# Patient Record
Sex: Female | Born: 1990 | Race: Black or African American | Hispanic: No | Marital: Single | Smoking: Never smoker
Health system: Southern US, Community
[De-identification: ages and names within clinical notes are randomized; demographics above are authoritative.]

## PROBLEM LIST (undated history)

## (undated) ENCOUNTER — Inpatient Hospital Stay (HOSPITAL_COMMUNITY): Payer: Self-pay

## (undated) DIAGNOSIS — D509 Iron deficiency anemia, unspecified: Secondary | ICD-10-CM

## (undated) DIAGNOSIS — M549 Dorsalgia, unspecified: Secondary | ICD-10-CM

## (undated) DIAGNOSIS — I10 Essential (primary) hypertension: Secondary | ICD-10-CM

## (undated) DIAGNOSIS — O24419 Gestational diabetes mellitus in pregnancy, unspecified control: Secondary | ICD-10-CM

## (undated) DIAGNOSIS — I2699 Other pulmonary embolism without acute cor pulmonale: Secondary | ICD-10-CM

## (undated) DIAGNOSIS — G4730 Sleep apnea, unspecified: Secondary | ICD-10-CM

## (undated) DIAGNOSIS — E119 Type 2 diabetes mellitus without complications: Secondary | ICD-10-CM

## (undated) DIAGNOSIS — D649 Anemia, unspecified: Secondary | ICD-10-CM

## (undated) DIAGNOSIS — I509 Heart failure, unspecified: Secondary | ICD-10-CM

## (undated) DIAGNOSIS — R0602 Shortness of breath: Secondary | ICD-10-CM

## (undated) HISTORY — DX: Anemia, unspecified: D64.9

## (undated) HISTORY — DX: Type 2 diabetes mellitus without complications: E11.9

## (undated) HISTORY — DX: Shortness of breath: R06.02

## (undated) HISTORY — DX: Dorsalgia, unspecified: M54.9

## (undated) HISTORY — DX: Iron deficiency anemia, unspecified: D50.9

---
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
## 2012-04-21 DIAGNOSIS — O24419 Gestational diabetes mellitus in pregnancy, unspecified control: Secondary | ICD-10-CM

## 2015-06-19 ENCOUNTER — Emergency Department (HOSPITAL_COMMUNITY)
Admission: EM | Admit: 2015-06-19 | Discharge: 2015-06-19 | Disposition: A | Discharge status: Home / Self Care | Payer: Medicaid Other | Attending: Emergency Medicine | Admitting: Emergency Medicine

## 2015-06-19 ENCOUNTER — Emergency Department (HOSPITAL_COMMUNITY): Payer: Medicaid Other

## 2015-06-19 ENCOUNTER — Encounter (HOSPITAL_COMMUNITY): Payer: Self-pay | Admitting: Emergency Medicine

## 2015-06-19 DIAGNOSIS — F17200 Nicotine dependence, unspecified, uncomplicated: Secondary | ICD-10-CM | POA: Insufficient documentation

## 2015-06-19 DIAGNOSIS — J988 Other specified respiratory disorders: Secondary | ICD-10-CM

## 2015-06-19 DIAGNOSIS — Z79899 Other long term (current) drug therapy: Secondary | ICD-10-CM | POA: Diagnosis not present

## 2015-06-19 DIAGNOSIS — R102 Pelvic and perineal pain: Secondary | ICD-10-CM

## 2015-06-19 DIAGNOSIS — R5383 Other fatigue: Secondary | ICD-10-CM | POA: Insufficient documentation

## 2015-06-19 DIAGNOSIS — Z3490 Encounter for supervision of normal pregnancy, unspecified, unspecified trimester: Secondary | ICD-10-CM

## 2015-06-19 DIAGNOSIS — O219 Vomiting of pregnancy, unspecified: Secondary | ICD-10-CM | POA: Diagnosis not present

## 2015-06-19 DIAGNOSIS — Z3A01 Less than 8 weeks gestation of pregnancy: Secondary | ICD-10-CM | POA: Diagnosis not present

## 2015-06-19 DIAGNOSIS — B9789 Other viral agents as the cause of diseases classified elsewhere: Secondary | ICD-10-CM

## 2015-06-19 DIAGNOSIS — R112 Nausea with vomiting, unspecified: Secondary | ICD-10-CM

## 2015-06-19 DIAGNOSIS — R630 Anorexia: Secondary | ICD-10-CM | POA: Diagnosis not present

## 2015-06-19 DIAGNOSIS — O99511 Diseases of the respiratory system complicating pregnancy, first trimester: Secondary | ICD-10-CM | POA: Insufficient documentation

## 2015-06-19 DIAGNOSIS — B9689 Other specified bacterial agents as the cause of diseases classified elsewhere: Secondary | ICD-10-CM

## 2015-06-19 DIAGNOSIS — O23591 Infection of other part of genital tract in pregnancy, first trimester: Secondary | ICD-10-CM | POA: Diagnosis not present

## 2015-06-19 DIAGNOSIS — O99331 Smoking (tobacco) complicating pregnancy, first trimester: Secondary | ICD-10-CM | POA: Insufficient documentation

## 2015-06-19 DIAGNOSIS — O9989 Other specified diseases and conditions complicating pregnancy, childbirth and the puerperium: Secondary | ICD-10-CM | POA: Diagnosis not present

## 2015-06-19 DIAGNOSIS — J069 Acute upper respiratory infection, unspecified: Secondary | ICD-10-CM | POA: Insufficient documentation

## 2015-06-19 DIAGNOSIS — N760 Acute vaginitis: Secondary | ICD-10-CM

## 2015-06-19 LAB — CBC WITH DIFFERENTIAL/PLATELET
Basophils Absolute: 0 10*3/uL (ref 0.0–0.1)
Basophils Relative: 0 %
Eosinophils Absolute: 0 10*3/uL (ref 0.0–0.7)
Eosinophils Relative: 0 %
HCT: 34.9 % — ABNORMAL LOW (ref 36.0–46.0)
Hemoglobin: 11.6 g/dL — ABNORMAL LOW (ref 12.0–15.0)
Lymphocytes Relative: 20 %
Lymphs Abs: 2.5 10*3/uL (ref 0.7–4.0)
MCH: 20.7 pg — ABNORMAL LOW (ref 26.0–34.0)
MCHC: 33.2 g/dL (ref 30.0–36.0)
MCV: 62.3 fL — ABNORMAL LOW (ref 78.0–100.0)
Monocytes Absolute: 0.6 10*3/uL (ref 0.1–1.0)
Monocytes Relative: 5 %
Neutro Abs: 9.3 10*3/uL — ABNORMAL HIGH (ref 1.7–7.7)
Neutrophils Relative %: 75 %
Platelets: 425 10*3/uL — ABNORMAL HIGH (ref 150–400)
RBC: 5.6 MIL/uL — ABNORMAL HIGH (ref 3.87–5.11)
RDW: 20.7 % — ABNORMAL HIGH (ref 11.5–15.5)
WBC: 12.4 10*3/uL — ABNORMAL HIGH (ref 4.0–10.5)

## 2015-06-19 LAB — WET PREP, GENITAL
Sperm: NONE SEEN
Trich, Wet Prep: NONE SEEN
Yeast Wet Prep HPF POC: NONE SEEN

## 2015-06-19 LAB — COMPREHENSIVE METABOLIC PANEL
ALT: 13 U/L — ABNORMAL LOW (ref 14–54)
AST: 18 U/L (ref 15–41)
Albumin: 3.6 g/dL (ref 3.5–5.0)
Alkaline Phosphatase: 92 U/L (ref 38–126)
Anion gap: 12 (ref 5–15)
BUN: <5 mg/dL — ABNORMAL LOW (ref 6–20)
CO2: 20 mmol/L — ABNORMAL LOW (ref 22–32)
Calcium: 9.3 mg/dL (ref 8.9–10.3)
Chloride: 106 mmol/L (ref 101–111)
Creatinine, Ser: 0.63 mg/dL (ref 0.44–1.00)
GFR calc Af Amer: >60 mL/min (ref >60)
GFR calc non Af Amer: >60 mL/min (ref >60)
Glucose, Bld: 82 mg/dL (ref 65–99)
Potassium: 3.4 mmol/L — ABNORMAL LOW (ref 3.5–5.1)
Sodium: 138 mmol/L (ref 135–145)
Total Bilirubin: 0.7 mg/dL (ref 0.3–1.2)
Total Protein: 7.8 g/dL (ref 6.5–8.1)

## 2015-06-19 LAB — LIPASE, BLOOD: Lipase: 23 U/L (ref 11–51)

## 2015-06-19 LAB — PREGNANCY, URINE: Preg Test, Ur: POSITIVE — AB

## 2015-06-19 LAB — HCG, QUANTITATIVE, PREGNANCY: hCG, Beta Chain, Quant, S: 37216 m[IU]/mL — ABNORMAL HIGH (ref <5)

## 2015-06-19 MED ORDER — ONDANSETRON HCL 4 MG/2ML IJ SOLN
4.0000 mg | Freq: Once | INTRAMUSCULAR | Status: AC
  Administered 2015-06-19: 4 mg via INTRAVENOUS
  Filled 2015-06-19: qty 2

## 2015-06-19 MED ORDER — METRONIDAZOLE 500 MG PO TABS: 500.0000 mg | ORAL_TABLET | Freq: Two times a day (BID) | ORAL | Status: AC

## 2015-06-19 MED ORDER — DOXYLAMINE-PYRIDOXINE 10-10 MG PO TBEC: 10.0000 mg | DELAYED_RELEASE_TABLET | Freq: Once | ORAL | Status: DC

## 2015-06-19 MED ORDER — SODIUM CHLORIDE 0.9 % IV BOLUS (SEPSIS)
1000.0000 mL | Freq: Once | INTRAVENOUS | Status: AC
  Administered 2015-06-19: 1000 mL via INTRAVENOUS

## 2015-06-19 MED ORDER — GUAIFENESIN-DM 100-10 MG/5ML PO SYRP: 10.0000 mL | ORAL_SOLUTION | ORAL | Status: DC | PRN

## 2015-06-19 MED ORDER — MORPHINE SULFATE (PF) 4 MG/ML IV SOLN: 4.0000 mg | Freq: Once | INTRAVENOUS | Status: DC

## 2015-06-19 NOTE — ED Notes (Signed)
Pt stable, ambulatory, states understanding of discharge instructions 

## 2015-06-19 NOTE — ED Notes (Signed)
Main lab made aware of incorrect collection documentation of RPR. Blood sample was just sent to main lab by this nurse.

## 2015-06-19 NOTE — ED Notes (Signed)
PT transported to [place] at this time.

## 2015-06-19 NOTE — Discharge Instructions (Signed)
Medications: Diclegis, Flagyl, and Robitussin DM  Treatment: Take Diclegis as prescribed for nausea and vomiting. Take Flagyl as prescribed for bacterial vaginosis. Take Robitussin DM every 4 hours as needed for cough. You may take Tylenol for your body aches. Please start taking a Prenatal vitamin. You can buy this over-the-counter.  Follow-up: Please follow-up with your obstetric doctor at [facility] or at the [facility] listed here on your discharge paperwork. Please return to the emergency department if you develop any new or worsening symptoms, including vaginal bleeding, abdominal cramping, fevers, or any other concerning symptom.   Bacterial Vaginosis Bacterial vaginosis is a vaginal infection that occurs when the normal balance of bacteria in the vagina is disrupted. It results from an overgrowth of certain bacteria. This is the most common vaginal infection in women of childbearing age. Treatment is important to prevent complications, especially in pregnant women, as it can cause a premature delivery. CAUSES  Bacterial vaginosis is caused by an increase in harmful bacteria that are normally present in smaller amounts in the vagina. Several different kinds of bacteria can cause bacterial vaginosis. However, the reason that the condition develops is not fully understood. RISK FACTORS Certain activities or behaviors can put you at an increased risk of developing bacterial vaginosis, including:  Having a new sex partner or multiple sex partners.  Douching.  Using an intrauterine device (IUD) for contraception. Women do not get bacterial vaginosis from toilet seats, bedding, swimming pools, or contact with objects around them. SIGNS AND SYMPTOMS  Some women with bacterial vaginosis have no signs or symptoms. Common symptoms include:  Grey vaginal discharge.  A fishlike odor with discharge, especially after sexual intercourse.  Itching or burning of the vagina and vulva.  Burning  or pain with urination. DIAGNOSIS  Your health care provider will take a medical history and examine the vagina for signs of bacterial vaginosis. A sample of vaginal fluid may be taken. Your health care provider will look at this sample under a microscope to check for bacteria and abnormal cells. A vaginal pH test may also be done.  TREATMENT  Bacterial vaginosis may be treated with antibiotic medicines. These may be given in the form of a pill or a vaginal cream. A second round of antibiotics may be prescribed if the condition comes back after treatment. Because bacterial vaginosis increases your risk for sexually transmitted diseases, getting treated can help reduce your risk for chlamydia, gonorrhea, HIV, and herpes. HOME CARE INSTRUCTIONS   Only take over-the-counter or prescription medicines as directed by your health care provider.  If antibiotic medicine was prescribed, take it as directed. Make sure you finish it even if you start to feel better.  Tell all sexual partners that you have a vaginal infection. They should see their health care provider and be treated if they have problems, such as a mild rash or itching.  During treatment, it is important that you follow these instructions:  Avoid sexual activity or use condoms correctly.  Do not douche.  Avoid alcohol as directed by your health care provider.  Avoid breastfeeding as directed by your health care provider. SEEK MEDICAL CARE IF:   Your symptoms are not improving after 3 days of treatment.  You have increased discharge or pain.  You have a fever. MAKE SURE YOU:   Understand these instructions.  Will watch your condition.  Will get help right away if you are not doing well or get worse. FOR MORE INFORMATION  Centers for Disease Control  and Prevention, Division of STD Prevention: [employer] American Sexual Health Association (ASHA): www.ashastd.org    This information is not intended to replace advice  given to you by your health care provider. Make sure you discuss any questions you have with your health care provider.   Document Released: 03/03/2005 Document Revised: 03/24/2014 Document Reviewed: 10/13/2012 Elsevier Interactive Patient Education 2016 [employer].  First Trimester of Pregnancy The first trimester of pregnancy is from week 1 until the end of week 12 (months 1 through 3). A week after a sperm fertilizes an egg, the egg will implant on the wall of the uterus. This embryo will begin to develop into a baby. Genes from you and your partner are forming the baby. The female genes determine whether the baby is a boy or a girl. At 6-8 weeks, the eyes and face are formed, and the heartbeat can be seen on ultrasound. At the end of 12 weeks, all the baby's organs are formed.  Now that you are pregnant, you will want to do everything you can to have a healthy baby. Two of the most important things are to get good prenatal care and to follow your health care provider's instructions. Prenatal care is all the medical care you receive before the baby's birth. This care will help prevent, find, and treat any problems during the pregnancy and childbirth. BODY CHANGES Your body goes through many changes during pregnancy. The changes vary from woman to woman.   You may gain or lose a couple of pounds at first.  You may feel sick to your stomach (nauseous) and throw up (vomit). If the vomiting is uncontrollable, call your health care provider.  You may tire easily.  You may develop headaches that can be relieved by medicines approved by your health care provider.  You may urinate more often. Painful urination may mean you have a bladder infection.  You may develop heartburn as a result of your pregnancy.  You may develop constipation because certain hormones are causing the muscles that push waste through your intestines to slow down.  You may develop hemorrhoids or swollen, bulging veins  (varicose veins).  Your breasts may begin to grow larger and become tender. Your nipples may stick out more, and the tissue that surrounds them (areola) may become darker.  Your gums may bleed and may be sensitive to brushing and flossing.  Dark spots or blotches (chloasma, mask of pregnancy) may develop on your face. This will likely fade after the baby is born.  Your menstrual periods will stop.  You may have a loss of appetite.  You may develop cravings for certain kinds of food.  You may have changes in your emotions from day to day, such as being excited to be pregnant or being concerned that something may go wrong with the pregnancy and baby.  You may have more vivid and strange dreams.  You may have changes in your hair. These can include thickening of your hair, rapid growth, and changes in texture. Some women also have hair loss during or after pregnancy, or hair that feels dry or thin. Your hair will most likely return to normal after your baby is born. WHAT TO EXPECT AT YOUR PRENATAL VISITS During a routine prenatal visit:  You will be weighed to make sure you and the baby are growing normally.  Your blood pressure will be taken.  Your abdomen will be measured to track your baby's growth.  The fetal heartbeat will be listened to  starting around week 10 or 12 of your pregnancy.  Test results from any previous visits will be discussed. Your health care provider may ask you:  How you are feeling.  If you are feeling the baby move.  If you have had any abnormal symptoms, such as leaking fluid, bleeding, severe headaches, or abdominal cramping.  If you are using any tobacco products, including cigarettes, chewing tobacco, and electronic cigarettes.  If you have any questions. Other tests that may be performed during your first trimester include:  Blood tests to find your blood type and to check for the presence of any previous infections. They will also be used to  check for low iron levels (anemia) and Rh antibodies. Later in the pregnancy, blood tests for diabetes will be done along with other tests if problems develop.  Urine tests to check for infections, diabetes, or protein in the urine.  An ultrasound to confirm the proper growth and development of the baby.  An amniocentesis to check for possible genetic problems.  Fetal screens for spina bifida and Down syndrome.  You may need other tests to make sure you and the baby are doing well.  HIV (human immunodeficiency virus) testing. Routine prenatal testing includes screening for HIV, unless you choose not to have this test. HOME CARE INSTRUCTIONS  Medicines  Follow your health care provider's instructions regarding medicine use. Specific medicines may be either safe or unsafe to take during pregnancy.  Take your prenatal vitamins as directed.  If you develop constipation, try taking a stool softener if your health care provider approves. Diet  Eat regular, well-balanced meals. Choose a variety of foods, such as meat or vegetable-based protein, fish, milk and low-fat dairy products, vegetables, fruits, and whole grain breads and cereals. Your health care provider will help you determine the amount of weight gain that is right for you.  Avoid raw meat and uncooked cheese. These carry germs that can cause birth defects in the baby.  Eating four or five small meals rather than three large meals a day may help relieve nausea and vomiting. If you start to feel nauseous, eating a few soda crackers can be helpful. Drinking liquids between meals instead of during meals also seems to help nausea and vomiting.  If you develop constipation, eat more high-fiber foods, such as fresh vegetables or fruit and whole grains. Drink enough fluids to keep your urine clear or pale yellow. Activity and Exercise  Exercise only as directed by your health care provider. Exercising will help you:  Control your  weight.  Stay in shape.  Be prepared for labor and delivery.  Experiencing pain or cramping in the lower abdomen or low back is a good sign that you should stop exercising. Check with your health care provider before continuing normal exercises.  Try to avoid standing for long periods of time. Move your legs often if you must stand in one place for a long time.  Avoid heavy lifting.  Wear low-heeled shoes, and practice good posture.  You may continue to have sex unless your health care provider directs you otherwise. Relief of Pain or Discomfort  Wear a good support bra for breast tenderness.   Take warm sitz baths to soothe any pain or discomfort caused by hemorrhoids. Use hemorrhoid cream if your health care provider approves.   Rest with your legs elevated if you have leg cramps or low back pain.  If you develop varicose veins in your legs, wear support hose. Elevate  your feet for 15 minutes, 3-4 times a day. Limit salt in your diet. Prenatal Care  Schedule your prenatal visits by the twelfth week of pregnancy. They are usually scheduled monthly at first, then more often in the last 2 months before delivery.  Write down your questions. Take them to your prenatal visits.  Keep all your prenatal visits as directed by your health care provider. Safety  Wear your seat belt at all times when driving.  Make a list of emergency phone numbers, including numbers for family, friends, the hospital, and police and fire departments. General Tips  Ask your health care provider for a referral to a local prenatal education class. Begin classes no later than at the beginning of month 6 of your pregnancy.  Ask for help if you have counseling or nutritional needs during pregnancy. Your health care provider can offer advice or refer you to specialists for help with various needs.  Do not use hot tubs, steam rooms, or saunas.  Do not douche or use tampons or scented sanitary pads.  Do  not cross your legs for long periods of time.  Avoid cat litter boxes and soil used by cats. These carry germs that can cause birth defects in the baby and possibly loss of the fetus by miscarriage or stillbirth.  Avoid all smoking, herbs, alcohol, and medicines not prescribed by your health care provider. Chemicals in these affect the formation and growth of the baby.  Do not use any tobacco products, including cigarettes, chewing tobacco, and electronic cigarettes. If you need help quitting, ask your health care provider. You may receive counseling support and other resources to help you quit.  Schedule a dentist appointment. At home, brush your teeth with a soft toothbrush and be gentle when you floss. SEEK MEDICAL CARE IF:   You have dizziness.  You have mild pelvic cramps, pelvic pressure, or nagging pain in the abdominal area.  You have persistent nausea, vomiting, or diarrhea.  You have a bad smelling vaginal discharge.  You have pain with urination.  You notice increased swelling in your face, hands, legs, or ankles. SEEK IMMEDIATE MEDICAL CARE IF:   You have a fever.  You are leaking fluid from your vagina.  You have spotting or bleeding from your vagina.  You have severe abdominal cramping or pain.  You have rapid weight gain or loss.  You vomit blood or material that looks like coffee grounds.  You are exposed to [place] measles and have never had them.  You are exposed to fifth disease or chickenpox.  You develop a severe headache.  You have shortness of breath.  You have any kind of trauma, such as from a fall or a car accident.   This information is not intended to replace advice given to you by your health care provider. Make sure you discuss any questions you have with your health care provider.   Document Released: 02/25/2001 Document Revised: 03/24/2014 Document Reviewed: 01/11/2013 Elsevier Interactive Patient Education 2016 Elsevier Inc.  Viral  Infections A viral infection can be caused by different types of viruses.Most viral infections are not serious and resolve on their own. However, some infections may cause severe symptoms and may lead to further complications. SYMPTOMS Viruses can frequently cause:  Minor sore throat.  Aches and pains.  Headaches.  Runny nose.  Different types of rashes.  Watery eyes.  Tiredness.  Cough.  Loss of appetite.  Gastrointestinal infections, resulting in nausea, vomiting, and diarrhea. These symptoms  do not respond to antibiotics because the infection is not caused by bacteria. However, you might catch a bacterial infection following the viral infection. This is sometimes called a "superinfection." Symptoms of such a bacterial infection may include:  Worsening sore throat with pus and difficulty swallowing.  Swollen neck glands.  Chills and a high or persistent fever.  Severe headache.  Tenderness over the sinuses.  Persistent overall ill feeling (malaise), muscle aches, and tiredness (fatigue).  Persistent cough.  Yellow, green, or brown mucus production with coughing. HOME CARE INSTRUCTIONS   Only take over-the-counter or prescription medicines for pain, discomfort, diarrhea, or fever as directed by your caregiver.  Drink enough water and fluids to keep your urine clear or pale yellow. Sports drinks can provide valuable electrolytes, sugars, and hydration.  Get plenty of rest and maintain proper nutrition. Soups and broths with crackers or rice are fine. SEEK IMMEDIATE MEDICAL CARE IF:   You have severe headaches, shortness of breath, chest pain, neck pain, or an unusual rash.  You have uncontrolled vomiting, diarrhea, or you are unable to keep down fluids.  You or your child has an oral temperature above 102 F (38.9 C), not controlled by medicine.  Your baby is older than 3 months with a rectal temperature of 102 F (38.9 C) or higher.  Your baby is 903 months  old or younger with a rectal temperature of 100.4 F (38 C) or higher. MAKE SURE YOU:   Understand these instructions.  Will watch your condition.  Will get help right away if you are not doing well or get worse.   This information is not intended to replace advice given to you by your health care provider. Make sure you discuss any questions you have with your health care provider.   Document Released: 12/11/2004 Document Revised: 05/26/2011 Document Reviewed: 08/09/2014 Elsevier Interactive Patient Education [employer] Elsevier Inc.

## 2015-06-19 NOTE — ED Notes (Addendum)
STARTED 2 DAYS AGO WITH BODYACHES, SORE THROAT, ABD PAIN AND VOMITING.  ALSO, STATES DID NOT HAVE A PERIOD IN MARCH. STATES HAS VAGINAL DISCHARGE "LIKE YEAST".

## 2015-06-19 NOTE — ED Notes (Signed)
ED PA at bedside

## 2015-06-19 NOTE — ED Provider Notes (Signed)
CSN: [id number]     Arrival date & time 06/19/15  1006 History   First MD Initiated Contact with Patient 06/19/15 1014     Chief Complaint  Patient presents with  . Abdominal Pain  . Emesis     (Consider location/radiation/quality/duration/timing/severity/associated sxs/prior Treatment) HPI Comments: Patient is a previously healthy 24 year old female who presents with flulike symptoms. Patient reports she began with cough, headache, body aches, sore throat, transient abdominal pain, nausea, vomiting yesterday while she was at work. Patient has vomited most likely bile 2-3 times this morning. She reports she has not been able to keep any food or fluids down since yesterday. Patient reports that she her whole body is sore and fatigued like she "worked out a lot yesterday." Has not taken any medicines at home. She states she has felt warm but has not documented a fever. Also states she has not had a period since February. Her periods had been previously normal of recent. 2 years ago her when she was on Depo-Provera, her periods were irregular. Pt is sexually active and has not been using birth control for the past 2 years. Patient reports intermittent vaginal discharge that resembled past yeast infections. She is not having discharge today.  Patient is a 25 y.o. female presenting with abdominal pain and vomiting. The history is provided by the patient.  Abdominal Pain Associated symptoms: cough, fatigue, nausea, sore throat and vomiting   Associated symptoms: no chest pain, no chills, no dysuria, no fever and no shortness of breath   Emesis Associated symptoms: abdominal pain (generalized), headaches, myalgias and sore throat   Associated symptoms: no chills     History reviewed. No pertinent past medical history. Past Surgical History  Procedure Laterality Date  . Cesarean section     No family history on file. Social History  Substance Use Topics  . Smoking status: Current Every Day  Smoker  . Smokeless tobacco: None  . Alcohol Use: No   OB History    No data available     Review of Systems  Constitutional: Positive for appetite change and fatigue. Negative for fever and chills.  HENT: Positive for sore throat. Negative for ear pain and facial swelling.   Respiratory: Positive for cough. Negative for shortness of breath.   Cardiovascular: Negative for chest pain.  Gastrointestinal: Positive for nausea, vomiting and abdominal pain (generalized).  Genitourinary: Negative for dysuria, urgency, frequency and difficulty urinating.  Musculoskeletal: Positive for myalgias. Negative for back pain.  Skin: Negative for rash and wound.  Neurological: Positive for headaches.  Psychiatric/Behavioral: The patient is not nervous/anxious.       Allergies  Review of patient's allergies indicates no known allergies.  Home Medications   Prior to Admission medications   Medication Sig Start Date End Date Taking? Authorizing Provider  ibuprofen (ADVIL,MOTRIN) 400 MG tablet Take 400 mg by mouth every 6 (six) hours as needed for mild pain.   Yes Historical Provider, MD  Doxylamine-Pyridoxine (DICLEGIS) 10-10 MG TBEC Take 10 mg by mouth once. Take 2 tablets at bedtime on day 1and 2; if symptoms persist, take one tablet in the morning and 2 tablets at bedtime on day 3. Do not take more than 4 tablets per day. 06/19/15   Tsuneo Faison M Syrina Wake, PA-C  guaiFENesin-dextromethorphan (ROBITUSSIN DM) 100-10 MG/5ML syrup Take 10 mLs by mouth every 4 (four) hours as needed for cough. 06/19/15   Emi Holes, PA-C  metroNIDAZOLE (FLAGYL) 500 MG tablet Take 1 tablet (500 mg total)  by mouth 2 (two) times daily. 06/19/15 06/26/15  Tanza Pellot M Srihith Aquilino, PA-C   BP 112/65 mmHg  Pulse 93  Temp(Src) 98.7 F (37.1 C) (Oral)  Resp 16  Ht  (1.575 m)  Wt 116.302 kg  BMI 46.88 kg/m2  SpO2 100%  LMP 05/02/2015 Physical Exam  Constitutional: She appears well-developed and well-nourished. No distress.  HENT:   Head: Normocephalic and atraumatic.  Mouth/Throat: Oropharynx is clear and moist. No oropharyngeal exudate.  Eyes: Conjunctivae are normal. Pupils are equal, round, and reactive to light. Right eye exhibits no discharge. Left eye exhibits no discharge. No scleral icterus.  Neck: Normal range of motion. Neck supple. No thyromegaly present.  Cardiovascular: Normal rate, regular rhythm, normal heart sounds and intact distal pulses.  Exam reveals no gallop and no friction rub.   No murmur heard. Pulmonary/Chest: Effort normal and breath sounds normal. No stridor. No respiratory distress. She has no wheezes. She has no rales.  Abdominal: Soft. She exhibits no distension. There is generalized tenderness. There is no rebound and no guarding.  Genitourinary: Right adnexum displays no mass. Left adnexum displays no mass. No bleeding in the vagina. No foreign body around the vagina. Vaginal discharge (minimal, normal looking discharge from cervical os) found.  Patient did report some increased pressure on exam, but no cervical motion tenderness; no masses palpated  Musculoskeletal: She exhibits no edema.  Lymphadenopathy:    She has no cervical adenopathy.  Neurological: She is alert. Coordination normal.  Skin: Skin is warm and dry. No rash noted. She is not diaphoretic. No pallor.  Psychiatric: She has a normal mood and affect.  Nursing note and vitals reviewed.   ED Course  Procedures (including critical care time) Labs Review Labs Reviewed  WET PREP, GENITAL - Abnormal; Notable for the following:    Clue Cells Wet Prep HPF POC PRESENT (*)    WBC, Wet Prep HPF POC MANY (*)    All other components within normal limits  PREGNANCY, URINE - Abnormal; Notable for the following:    Preg Test, Ur POSITIVE (*)    All other components within normal limits  COMPREHENSIVE METABOLIC PANEL - Abnormal; Notable for the following:    Potassium 3.4 (*)    CO2 20 (*)    BUN <5 (*)    ALT 13 (*)    All  other components within normal limits  CBC WITH DIFFERENTIAL/PLATELET - Abnormal; Notable for the following:    WBC 12.4 (*)    RBC 5.60 (*)    Hemoglobin 11.6 (*)    HCT 34.9 (*)    MCV 62.3 (*)    MCH 20.7 (*)    RDW 20.7 (*)    Platelets 425 (*)    Neutro Abs 9.3 (*)    All other components within normal limits  HCG, QUANTITATIVE, PREGNANCY - Abnormal; Notable for the following:    hCG, Beta Chain, Quant, S 37216 (*)    All other components within normal limits  LIPASE, BLOOD  RPR  GC/CHLAMYDIA PROBE AMP ([place]) NOT AT [facility]    Imaging Review US Ob Comp Less 14 Wks  06/19/2015  CLINICAL DATA:  Nausea and vomiting. First-trimester pregnancy. Abdominal pain EXAM: OBSTETRIC <14 WK [place] AND TRANSVAGINAL OB US TECHNIQUE: Both transabdominal and transvaginal ultrasound examinations were performed for complete evaluation of the gestation as well as the maternal uterus, adnexal regions, and pelvic cul-de-sac. Transvaginal technique was performed to assess early pregnancy. COMPARISON:  None available FINDINGS: Intrauterine  gestational sac: Visualized/normal in shape. Yolk sac:  Present Embryo:  Present Cardiac Activity: Present Heart Rate: 126  bpm CRL:  5.3  mm   6 w   2 d                  US EDC: 02/10/2016 Subchorionic hemorrhage:  8 mm hematoma present inferiorly. Maternal uterus/adnexae: Probable corpus luteum on the left. IMPRESSION: 1. Single living intrauterine pregnancy measuring 6 weeks 2 days. 2. 8 mm subchorionic hematoma. Electronically Signed   By: Marnee SpringJonathon  Watts M.D.   On: 06/19/2015 15:12   [place] Ob Transvaginal  06/19/2015  CLINICAL DATA:  Nausea and vomiting. First-trimester pregnancy. Abdominal pain EXAM: OBSTETRIC <14 WK US AND TRANSVAGINAL OB US TECHNIQUE: Both transabdominal and transvaginal ultrasound examinations were performed for complete evaluation of the gestation as well as the maternal uterus, adnexal regions, and pelvic cul-de-sac. Transvaginal technique was performed  to assess early pregnancy. COMPARISON:  None available FINDINGS: Intrauterine gestational sac: Visualized/normal in shape. Yolk sac:  Present Embryo:  Present Cardiac Activity: Present Heart Rate: 126  bpm CRL:  5.3  mm   6 w   2 d                  US EDC: 02/10/2016 Subchorionic hemorrhage:  8 mm hematoma present inferiorly. Maternal uterus/adnexae: Probable corpus luteum on the left. IMPRESSION: 1. Single living intrauterine pregnancy measuring 6 weeks 2 days. 2. 8 mm subchorionic hematoma. Electronically Signed   By: Marnee SpringJonathon  Watts M.D.   On: 06/19/2015 15:12   I have personally reviewed and evaluated these images and lab results as part of my medical decision-making.   EKG Interpretation None      MDM   Pregnancy positive, beta hCG Quant shows 37216. Wet prep shows clue cells. CMP unremarkable. CBC shows elevated WBC and mild anemia. Lipase 23. Vaginal ultrasound shows 6 week living fetus and 8mm subchorionic hematoma. Suspect viral respiratory illness. Nausea, vomiting may be related to virus or pregnancy. Condition discharged with Diclegis, Flagyl, Robitussin DM. Advised to take Tylenol for body aches. Advised to begin prenatal vitamins. Patient to follow up with OB. Patient states she is ready to go and eat. Patient discussed with Dr. Anitra LauthPlunkett who is in agreement with plan. Patient is in understanding and agreement with plan.  Final diagnoses:  Pregnancy  Viral respiratory illness  Non-intractable vomiting with nausea, vomiting of unspecified type  Bacterial vaginosis      Emi Holeslexandra M Finnick Orosz, PA-C 06/19/15 1607  Gwyneth SproutWhitney Plunkett, MD 06/20/15 2126

## 2015-06-19 NOTE — ED Notes (Signed)
Pelvic cart at bedside and attending PA at bedside as well.

## 2015-06-20 LAB — RPR: RPR Ser Ql: NONREACTIVE

## 2015-06-20 LAB — GC/CHLAMYDIA PROBE AMP (~~LOC~~) NOT AT ARMC
Chlamydia: NEGATIVE
Neisseria Gonorrhea: NEGATIVE

## 2015-07-31 ENCOUNTER — Ambulatory Visit (INDEPENDENT_AMBULATORY_CARE_PROVIDER_SITE_OTHER): Payer: Medicaid Other | Admitting: Family

## 2015-07-31 ENCOUNTER — Encounter: Payer: Self-pay | Admitting: Family

## 2015-07-31 VITALS — BP 101/62 | HR 113 | Wt 247.0 lb

## 2015-07-31 DIAGNOSIS — Z3689 Encounter for other specified antenatal screening: Secondary | ICD-10-CM

## 2015-07-31 DIAGNOSIS — O219 Vomiting of pregnancy, unspecified: Secondary | ICD-10-CM | POA: Diagnosis not present

## 2015-07-31 DIAGNOSIS — Z3491 Encounter for supervision of normal pregnancy, unspecified, first trimester: Secondary | ICD-10-CM

## 2015-07-31 DIAGNOSIS — Z3490 Encounter for supervision of normal pregnancy, unspecified, unspecified trimester: Secondary | ICD-10-CM | POA: Insufficient documentation

## 2015-07-31 DIAGNOSIS — O34219 Maternal care for unspecified type scar from previous cesarean delivery: Secondary | ICD-10-CM | POA: Diagnosis not present

## 2015-07-31 DIAGNOSIS — Z36 Encounter for antenatal screening of mother: Secondary | ICD-10-CM | POA: Diagnosis not present

## 2015-07-31 DIAGNOSIS — Z3481 Encounter for supervision of other normal pregnancy, first trimester: Secondary | ICD-10-CM

## 2015-07-31 LAB — POCT URINALYSIS DIP (DEVICE)
GLUCOSE, UA: NEGATIVE mg/dL
HGB URINE DIPSTICK: NEGATIVE
Ketones, ur: 15 mg/dL — AB
LEUKOCYTES UA: NEGATIVE
NITRITE: NEGATIVE
Protein, ur: 30 mg/dL — AB
Specific Gravity, Urine: >=1.03 (ref 1.005–1.030)
UROBILINOGEN UA: 0.2 mg/dL (ref 0.0–1.0)
pH: 6 (ref 5.0–8.0)

## 2015-07-31 MED ORDER — PRENATAL VITAMINS 0.8 MG PO TABS: 1.0000 | ORAL_TABLET | Freq: Every day | ORAL | Status: DC

## 2015-07-31 MED ORDER — METOCLOPRAMIDE HCL 10 MG PO TABS: 10.0000 mg | ORAL_TABLET | Freq: Three times a day (TID) | ORAL | Status: DC

## 2015-07-31 NOTE — Progress Notes (Signed)
Patient reports pain & tenderness over c/section scar for past few days. Also reports extreme nausea/vomiting to which she cannot keep anything down. States she had this problem with last pregnancy & the nausea medication didn't help at all.  Patient reports normal pap last year Attempted 1 hr gtt today but patient vomited.

## 2015-07-31 NOTE — Progress Notes (Signed)
   Subjective:    Christine Singleton is a G2P1001 1650w2d being seen today for her first obstetrical visit.  Her obstetrical history is significant for prior csection and hx of gestational diabetes. Patient does intend to breast feed. Pregnancy history fully reviewed.  Patient reports lower intermittent pelvic pain.  No report of bleeding.  +nausea and vomitng.  Ceasar Mons.  Filed Vitals:   07/31/15 1346  BP: 101/62  Pulse: 113  Weight: 247 lb (112.038 kg)    HISTORY: OB History  Gravida Para Term Preterm AB SAB TAB Ectopic Multiple Living  2 1 1  0 0 0 0 0 0 1    # Outcome Date GA Lbr Len/2nd Weight Sex Delivery Anes PTL Lv  2 Current           1 Term 04/21/12 7826w0d  6 lb (2.722 kg) F CS-Unspec EPI  Y     Complications: Fetal Intolerance,Gestational diabetes     History reviewed. No pertinent past medical history. Past Surgical History  Procedure Laterality Date  . Cesarean section     History reviewed. No pertinent family history.   Exam   Filed Vitals:   07/31/15 1346  BP: 101/62  Pulse: 113   System: Breast:  No nipple retraction or dimpling, No nipple discharge or bleeding, No axillary or supraclavicular adenopathy, Normal to palpation without dominant masses   Skin: normal coloration and turgor, no rashes    Neurologic: negative   Extremities: normal strength, tone, and muscle mass   HEENT neck supple with midline trachea and thyroid without masses   Mouth/Teeth mucous membranes moist, pharynx normal without lesions   Neck supple and no masses   Cardiovascular: regular rate and rhythm, no murmurs or gallops   Respiratory:  appears well, vitals normal, no respiratory distress, acyanotic, normal RR, neck free of mass or lymphadenopathy, chest clear, no wheezing, crepitations, rhonchi, normal symmetric air entry   Abdomen: soft, non-tender; bowel sounds normal; no masses,  no organomegaly      Assessment:    Pregnancy: G2P1001 Patient Active Problem List   Diagnosis Date  Noted  . Supervision of normal pregnancy, antepartum 07/31/2015  . Previous cesarean delivery affecting pregnancy, antepartum 07/31/2015        Plan:     Initial labs drawn. Prenatal vitamins. Problem list reviewed and updated. Genetic Screening discussed First Screen: ordered. Given TOLAC consent to review RX Reglan Follow up in 4 weeks.  Marlis EdelsonKARIM, Saleem Coccia N 07/31/2015

## 2015-07-31 NOTE — Patient Instructions (Addendum)
AREA PEDIATRIC/FAMILY PRACTICE PHYSICIANS  [facility] [address] [place], [place] [address] Phone - [phone]   Fax - [phone]  JACK AMOS [address] [place], [place]  [address] Phone - [phone]   Fax - [phone]  [facility] [address] [place], [place]  [address] Phone - [phone]   Fax - [phone]  [facility] [address] [place], [place]  [address] Phone - [phone]   Fax - [phone]  [facility] [address], [address] [place], [place]  [address] Phone - [phone]   Fax - [phone]  [facility] [address] [place], [place]  [address] Phone - [phone]   Fax - [phone]  [facility] [address] [place], [place]  [address] Phone - [phone]   Fax - [phone]  [facility] [address], [address] [place], [place]  [address] Phone - [phone]   Fax - [phone]  [facility] [address] [place], [place]  [address] Phone - [phone]   Fax - [phone] [facility] [address] [place], [place]  [address] Phone - [phone]   Fax - [phone]  [facility] [address] [place], [place]  [address] Phone - [phone]   Fax - [phone]  [facility] [address] [place], [place]  [address] Phone - [phone]   Fax - [phone]  [facility] [address], [address] [place], [place]  [address] Phone - [phone]   Fax - [phone]  SHILPA GOSRANI [address] [place], [place]  [address] Phone - [phone]  [facility] [address] [place], [place]  [address] Phone - [phone]   Fax - [phone]  [facility] [address] [place], [place]  [address] Phone - [phone]   Fax - [phone]  [facility] [address] [place], [place]  [address] Phone - [phone]   Fax - [phone]  [facility] [address] [place], [place]  [address] Phone - [phone]   Fax - [phone]   [facility] [address] [place], [place]  [address] Phone - [phone]   Fax - [phone]  [facility] [address] [place], [place]  [address] Phone - [phone]   Fax - [phone]  DAVID RUBIN [address] [place], [place]  [address] Phone - [phone]   Fax - [phone]  [facility] [address] [place], [place]  [address] Phone - [phone]   Fax - [phone]  [facility] [address] [place], [place]  [address] Phone - [phone]   Fax - [phone] [facility] [address] [place], [place]  [address] Phone - [phone]   Fax - [phone]  [facility] [address] [place], [place]  [address] Phone - [phone]   Fax - [phone]   [facility] [address] [place], [place]  [address] Phone - [phone]   Fax - [phone]   Second Trimester of Pregnancy The second trimester is from week 13 through week 28, months 4 through 6. The second trimester is often a time when you feel your best. Your body has   also adjusted to being pregnant, and you begin to feel better physically. Usually, morning sickness has lessened or quit completely, you may have more energy, and you may have an increase in appetite. The second trimester is also a time when the fetus is growing rapidly. At the end of the sixth month, the fetus is about 9 inches long and weighs about 1 pounds. You will likely begin to feel the baby move (quickening) between 18 and 20 weeks of the  pregnancy. BODY CHANGES Your body goes through many changes during pregnancy. The changes vary from woman to woman.   Your weight will continue to increase. You will notice your lower abdomen bulging out.  You may begin to get stretch marks on your hips, abdomen, and breasts.  You may develop headaches that can be relieved by medicines approved by your health care provider.  You may urinate more often because the fetus is pressing on your bladder.  You may develop or continue to have heartburn as a result of your pregnancy.  You may develop constipation because certain hormones are causing the muscles that push waste through your intestines to slow down.  You may develop hemorrhoids or swollen, bulging veins (varicose veins).  You may have back pain because of the weight gain and pregnancy hormones relaxing your joints between the bones in your pelvis and as a result of a shift in weight and the muscles that support your balance.  Your breasts will continue to grow and be tender.  Your gums may bleed and may be sensitive to brushing and flossing.  Dark spots or blotches (chloasma, mask of pregnancy) may develop on your face. This will likely fade after the baby is born.  A dark line from your belly button to the pubic area (linea nigra) may appear. This will likely fade after the baby is born.  You may have changes in your hair. These can include thickening of your hair, rapid growth, and changes in texture. Some women also have hair loss during or after pregnancy, or hair that feels dry or thin. Your hair will most likely return to normal after your baby is born. WHAT TO EXPECT AT YOUR PRENATAL VISITS During a routine prenatal visit:  You will be weighed to make sure you and the fetus are growing normally.  Your blood pressure will be taken.  Your abdomen will be measured to track your baby's growth.  The fetal heartbeat will be listened to.  Any test results from the  previous visit will be discussed. Your health care provider may ask you:  How you are feeling.  If you are feeling the baby move.  If you have had any abnormal symptoms, such as leaking fluid, bleeding, severe headaches, or abdominal cramping.  If you are using any tobacco products, including cigarettes, chewing tobacco, and electronic cigarettes.  If you have any questions. Other tests that may be performed during your second trimester include:  Blood tests that check for:  Low iron levels (anemia).  Gestational diabetes (between 24 and 28 weeks).  Rh antibodies.  Urine tests to check for infections, diabetes, or protein in the urine.  An ultrasound to confirm the proper growth and development of the baby.  An amniocentesis to check for possible genetic problems.  Fetal screens for spina bifida and Down syndrome.  HIV (human immunodeficiency virus) testing. Routine prenatal testing includes screening for HIV, unless you choose not to have this test. HOME CARE INSTRUCTIONS   Avoid all smoking,   herbs, alcohol, and unprescribed drugs. These chemicals affect the formation and growth of the baby.  Do not use any tobacco products, including cigarettes, chewing tobacco, and electronic cigarettes. If you need help quitting, ask your health care provider. You may receive counseling support and other resources to help you quit.  Follow your health care provider's instructions regarding medicine use. There are medicines that are either safe or unsafe to take during pregnancy.  Exercise only as directed by your health care provider. Experiencing uterine cramps is a good sign to stop exercising.  Continue to eat regular, healthy meals.  Wear a good support bra for breast tenderness.  Do not use hot tubs, steam rooms, or saunas.  Wear your seat belt at all times when driving.  Avoid raw meat, uncooked cheese, cat litter boxes, and soil used by cats. These carry germs that can  cause birth defects in the baby.  Take your prenatal vitamins.  Take 1500-2000 mg of calcium daily starting at the 20th week of pregnancy until you deliver your baby.  Try taking a stool softener (if your health care provider approves) if you develop constipation. Eat more high-fiber foods, such as fresh vegetables or fruit and whole grains. Drink plenty of fluids to keep your urine clear or pale yellow.  Take warm sitz baths to soothe any pain or discomfort caused by hemorrhoids. Use hemorrhoid cream if your health care provider approves.  If you develop varicose veins, wear support hose. Elevate your feet for 15 minutes, 3-4 times a day. Limit salt in your diet.  Avoid heavy lifting, wear low heel shoes, and practice good posture.  Rest with your legs elevated if you have leg cramps or low back pain.  Visit your dentist if you have not gone yet during your pregnancy. Use a soft toothbrush to brush your teeth and be gentle when you floss.  A sexual relationship may be continued unless your health care provider directs you otherwise.  Continue to go to all your prenatal visits as directed by your health care provider. SEEK MEDICAL CARE IF:   You have dizziness.  You have mild pelvic cramps, pelvic pressure, or nagging pain in the abdominal area.  You have persistent nausea, vomiting, or diarrhea.  You have a bad smelling vaginal discharge.  You have pain with urination. SEEK IMMEDIATE MEDICAL CARE IF:   You have a fever.  You are leaking fluid from your vagina.  You have spotting or bleeding from your vagina.  You have severe abdominal cramping or pain.  You have rapid weight gain or loss.  You have shortness of breath with chest pain.  You notice sudden or extreme swelling of your face, hands, ankles, feet, or legs.  You have not felt your baby move in over an hour.  You have severe headaches that do not go away with medicine.  You have vision changes.   This  information is not intended to replace advice given to you by your health care provider. Make sure you discuss any questions you have with your health care provider.   Document Released: 02/25/2001 Document Revised: 03/24/2014 Document Reviewed: 05/04/2012 Elsevier Interactive Patient Education 2016 [employer]  Vaginal Birth After Cesarean Delivery Vaginal birth after cesarean delivery (VBAC) is giving birth vaginally after previously delivering a baby by a cesarean. In the past, if a woman had a cesarean delivery, all births afterward would be done by cesarean delivery. This is no longer true. It can be safe for the  mother to try a vaginal delivery after having a cesarean delivery.  It is important to discuss VBAC with your health care provider early in the pregnancy so you can understand the risks, benefits, and options. It will give you time to decide what is best in your particular case. The final decision about whether to have a VBAC or repeat cesarean delivery should be between you and your health care provider. Any changes in your health or your baby's health during your pregnancy may make it necessary to change your initial decision about VBAC.  WOMEN WHO PLAN TO HAVE A VBAC SHOULD CHECK WITH THEIR HEALTH CARE PROVIDER TO BE SURE THAT:  The previous cesarean delivery was done with a low transverse uterine cut (incision) (not a vertical classical incision).   The birth canal is big enough for the baby.   There were no other operations on the uterus.   An electronic fetal monitor (EFM) will be on at all times during labor.   An operating room will be available and ready in case an emergency cesarean delivery is needed.   A health care provider and surgical nursing staff will be available at all times during labor to be ready to do an emergency delivery cesarean if necessary.   An anesthesiologist will be present in case an emergency cesarean delivery is needed.   The nursery  is prepared and has adequate personnel and necessary equipment available to care for the baby in case of an emergency cesarean delivery. BENEFITS OF VBAC  Shorter stay in the hospital.   Avoidance of risks associated with cesarean delivery, such as:  Surgical complications, such as opening of the incision or hernia in the incision.  Injury to other organs.  Fever. This can occur if an infection develops after surgery. It can also occur as a reaction to the medicine given to make you numb during the surgery.  Less blood loss and need for blood transfusions.  Lower risk of blood clots and infection.  Shorter recovery.   Decreased risk for having to remove the uterus (hysterectomy).   Decreased risk for the placenta to completely or partially cover the opening of the uterus (placenta previa) with a future pregnancy.   Decrease risk in future labor and delivery. RISKS OF A VBAC  Tearing (rupture) of the uterus. This is occurs in less than 1% of VBACs. The risk of this happening is higher if:  Steps are taken to begin the labor process (induce labor) or stimulate or strengthen contractions (augment labor).   Medicine is used to soften (ripen) the cervix.  Having to remove the uterus (hysterectomy) if it ruptures. VBAC SHOULD NOT BE DONE IF:  The previous cesarean delivery was done with a vertical (classical) or T-shaped incision or you do not know what kind of incision was made.   You had a ruptured uterus.   You have had certain types of surgery on your uterus, such as removal of uterine fibroids. Ask your health care provider about other types of surgeries that prevent you from having a VBAC.  You have certain medical or childbirth (obstetrical) problems.   There are problems with the baby.   You have had two previous cesarean deliveries and no vaginal deliveries. OTHER FACTS TO KNOW ABOUT VBAC:  It is safe to have an epidural anesthetic with VBAC.   It is  safe to turn the baby from a breech position (attempt an external cephalic version).   It is safe to try  a VBAC with twins.   VBAC may not be successful if your baby weights 8.8 lb (4 kg) or more. However, weight predictions are not always accurate and should not be used alone to decide if VBAC is right for you.  There is an increased failure rate if the time between the cesarean delivery and VBAC is less than 19 months.   Your health care provider may advise against a VBAC if you have preeclampsia (high blood pressure, protein in the urine, and swelling of face and extremities).   VBAC is often successful if you previously gave birth vaginally.   VBAC is often successful when the labor starts spontaneously before the due date.   Delivering a baby through a VBAC is similar to having a normal spontaneous vaginal delivery.   This information is not intended to replace advice given to you by your health care provider. Make sure you discuss any questions you have with your health care provider.   Document Released: 08/24/2006 Document Revised: 03/24/2014 Document Reviewed: 09/30/2012 Elsevier Interactive Patient Education [employer]

## 2015-08-01 LAB — PRENATAL PROFILE (SOLSTAS)
ANTIBODY SCREEN: NEGATIVE
BASOS PCT: 0 %
Basophils Absolute: 0 cells/uL (ref 0–200)
EOS PCT: 1 %
Eosinophils Absolute: 169 cells/uL (ref 15–500)
HEMATOCRIT: 33.7 % — AB (ref 35.0–45.0)
HEP B S AG: NEGATIVE
HIV 1&2 Ab, 4th Generation: NONREACTIVE
Hemoglobin: 11.1 g/dL — ABNORMAL LOW (ref 11.7–15.5)
LYMPHS ABS: 3549 {cells}/uL (ref 850–3900)
Lymphocytes Relative: 21 %
MCH: 21.9 pg — AB (ref 27.0–33.0)
MCHC: 32.9 g/dL (ref 32.0–36.0)
MCV: 66.3 fL — ABNORMAL LOW (ref 80.0–100.0)
MONOS PCT: 6 %
MPV: 9.5 fL (ref 7.5–12.5)
Monocytes Absolute: 1014 cells/uL — ABNORMAL HIGH (ref 200–950)
NEUTROS ABS: 12168 {cells}/uL — AB (ref 1500–7800)
Neutrophils Relative %: 72 %
Platelets: 449 10*3/uL — ABNORMAL HIGH (ref 140–400)
RBC: 5.08 MIL/uL (ref 3.80–5.10)
RDW: 21 % — ABNORMAL HIGH (ref 11.0–15.0)
Rh Type: POSITIVE
Rubella: 3.3 Index — ABNORMAL HIGH (ref <0.90)
WBC: 16.9 10*3/uL — AB (ref 3.8–10.8)

## 2015-08-01 LAB — HEMOGLOBINOPATHY EVALUATION
HEMOGLOBIN OTHER: 31.1 %
HGB A: 65.5 % — AB (ref >96.0)
HGB S QUANTITAION: 0 %
Hgb A2 Quant: 3.4 % (ref 1.8–3.5)
Hgb F Quant: 0 % (ref <2.0)

## 2015-08-02 ENCOUNTER — Encounter (HOSPITAL_COMMUNITY): Payer: Self-pay | Admitting: Family

## 2015-08-02 LAB — CULTURE, OB URINE: Colony Count: 9000

## 2015-08-04 LAB — PRESCRIPTION MONITORING PROFILE (19 PANEL)
AMPHETAMINE/METH: NEGATIVE ng/mL
BUPRENORPHINE, URINE: NEGATIVE ng/mL
Barbiturate Screen, Urine: NEGATIVE ng/mL
Benzodiazepine Screen, Urine: NEGATIVE ng/mL
CREATININE, URINE: 415.5 mg/dL (ref >20.0)
Carisoprodol, Urine: NEGATIVE ng/mL
Cocaine Metabolites: NEGATIVE ng/mL
ECSTASY: NEGATIVE ng/mL
Fentanyl, Ur: NEGATIVE ng/mL
MEPERIDINE UR: NEGATIVE ng/mL
METHADONE SCREEN, URINE: NEGATIVE ng/mL
METHAQUALONE SCREEN (URINE): NEGATIVE ng/mL
Nitrites, Initial: NEGATIVE ug/mL
Opiate Screen, Urine: NEGATIVE ng/mL
Oxycodone Screen, Ur: NEGATIVE ng/mL
PH URINE, INITIAL: 6.1 pH (ref 4.5–8.9)
PHENCYCLIDINE, UR: NEGATIVE ng/mL
Propoxyphene: NEGATIVE ng/mL
TAPENTADOLUR: NEGATIVE ng/mL
Tramadol Scrn, Ur: NEGATIVE ng/mL
Zolpidem, Urine: NEGATIVE ng/mL

## 2015-08-04 LAB — CANNABANOIDS (GC/LC/MS), URINE: THC-COOH (GC/LC/MS), ur confirm: 1273 ng/mL — ABNORMAL HIGH (ref <5)

## 2015-08-07 ENCOUNTER — Encounter (HOSPITAL_COMMUNITY): Payer: Self-pay

## 2015-08-08 ENCOUNTER — Ambulatory Visit (HOSPITAL_COMMUNITY): Admission: RE | Admit: 2015-08-08 | Payer: Medicaid Other | Source: Ambulatory Visit

## 2015-08-08 ENCOUNTER — Ambulatory Visit (HOSPITAL_COMMUNITY): Payer: Medicaid Other | Attending: Family

## 2015-08-08 HISTORY — DX: Gestational diabetes mellitus in pregnancy, unspecified control: O24.419

## 2015-08-09 LAB — HGB ELECTROPHORESIS REFLEXED REPORT
HEMOGLOBIN A2 - HGBRFX: 3.3 % (ref 1.8–3.5)
Hemoglobin A - HGBRFX: 62.4 % — ABNORMAL LOW (ref >96.0)
Hemoglobin Elect C: 34.3 % — ABNORMAL HIGH
Hemoglobin F - HGBRFX: 0 % (ref <2.0)
Sickle Solubility Test - HGBRFX: NEGATIVE

## 2015-08-10 ENCOUNTER — Encounter: Payer: Self-pay | Admitting: Family

## 2015-08-12 ENCOUNTER — Encounter: Payer: Self-pay | Admitting: Family Medicine

## 2015-08-12 DIAGNOSIS — D582 Other hemoglobinopathies: Secondary | ICD-10-CM | POA: Insufficient documentation

## 2015-08-29 ENCOUNTER — Ambulatory Visit (INDEPENDENT_AMBULATORY_CARE_PROVIDER_SITE_OTHER): Payer: Medicaid Other | Admitting: Family Medicine

## 2015-08-29 ENCOUNTER — Encounter: Payer: Self-pay | Admitting: General Practice

## 2015-08-29 VITALS — BP 126/64 | HR 104 | Wt 248.2 lb

## 2015-08-29 DIAGNOSIS — D582 Other hemoglobinopathies: Secondary | ICD-10-CM

## 2015-08-29 DIAGNOSIS — Z3482 Encounter for supervision of other normal pregnancy, second trimester: Secondary | ICD-10-CM | POA: Diagnosis present

## 2015-08-29 DIAGNOSIS — O34219 Maternal care for unspecified type scar from previous cesarean delivery: Secondary | ICD-10-CM | POA: Diagnosis not present

## 2015-08-29 LAB — POCT URINALYSIS DIP (DEVICE)
GLUCOSE, UA: NEGATIVE mg/dL
Nitrite: NEGATIVE
PH: 7 (ref 5.0–8.0)
PROTEIN: 100 mg/dL — AB
SPECIFIC GRAVITY, URINE: 1.02 (ref 1.005–1.030)
UROBILINOGEN UA: 1 mg/dL (ref 0.0–1.0)

## 2015-08-29 NOTE — Progress Notes (Signed)
Subjective:  Christine Singleton is a 25 y.o. G2P1001 at 4958w3d being seen today for ongoing prenatal care.  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  Patient reports no complaints.  Contractions: Not present. Vag. Bleeding: None.  Movement: Absent. Denies leaking of fluid.   The following portions of the patient's history were reviewed and updated as appropriate: allergies, current medications, past family history, past medical history, past social history, past surgical history and problem list. Problem list updated.  Objective:   Filed Vitals:   08/29/15 1040  BP: 126/64  Pulse: 104  Weight: 248 lb 3.2 oz (112.583 kg)    Fetal Status: Fetal Heart Rate (bpm): 160   Movement: Absent     General:  Alert, oriented and cooperative. Patient is in no acute distress.  Skin: Skin is warm and dry. No rash noted.   Cardiovascular: Normal heart rate noted  Respiratory: Normal respiratory effort, no problems with respiration noted  Abdomen: Soft, gravid, appropriate for gestational age. Pain/Pressure: Present     Pelvic: Cervical exam deferred        Extremities: Normal range of motion.  Edema: None  Mental Status: Normal mood and affect. Normal behavior. Normal judgment and thought content.   Urinalysis:      Assessment and Plan:  Pregnancy: G2P1001 at 5158w3d  1. Supervision of normal pregnancy, antepartum, second trimester - updated box - discussed TOLAC and patient signed consent today - Glucose Tolerance, 1 HR (50g) w/o Fasting  2. Hemoglobin C trait (HCC)  3. Previous cesarean delivery affecting pregnancy, antepartum Desires TOLAC  Preterm labor symptoms and general obstetric precautions including but not limited to vaginal bleeding, contractions, leaking of fluid and fetal movement were  reviewed in detail with the patient. Please refer to After Visit Summary for other counseling recommendations.  Return in about 4 weeks (around 09/26/2015) for Routine prenatal care.  Future Appointments Date Time Provider Department Center  09/19/2015 1:15 PM [facility] US 4 [facility]  09/27/2015 10:40 AM Dorathy KinsmanVirginia Smith, CNM [facility]    Federico FlakeKimberly Niles [place], [place]

## 2015-09-01 LAB — GLUCOSE TOLERANCE, 1 HOUR (50G) W/O FASTING: Glucose, 1 Hr, gestational: 116 mg/dL (ref <140)

## 2015-09-19 ENCOUNTER — Other Ambulatory Visit: Payer: Self-pay | Admitting: General Practice

## 2015-09-19 ENCOUNTER — Ambulatory Visit (HOSPITAL_COMMUNITY)
Admission: RE | Admit: 2015-09-19 | Discharge: 2015-09-19 | Disposition: A | Discharge status: Home / Self Care | Payer: Medicaid Other | Source: Ambulatory Visit | Attending: Family | Admitting: Family

## 2015-09-19 DIAGNOSIS — Z3482 Encounter for supervision of other normal pregnancy, second trimester: Secondary | ICD-10-CM

## 2015-09-19 DIAGNOSIS — Z3A19 19 weeks gestation of pregnancy: Secondary | ICD-10-CM

## 2015-09-19 DIAGNOSIS — E669 Obesity, unspecified: Secondary | ICD-10-CM | POA: Diagnosis present

## 2015-09-19 DIAGNOSIS — O99212 Obesity complicating pregnancy, second trimester: Secondary | ICD-10-CM

## 2015-09-19 DIAGNOSIS — Z3A20 20 weeks gestation of pregnancy: Secondary | ICD-10-CM | POA: Diagnosis not present

## 2015-09-19 DIAGNOSIS — Z36 Encounter for antenatal screening of mother: Secondary | ICD-10-CM | POA: Diagnosis not present

## 2015-09-19 DIAGNOSIS — O09292 Supervision of pregnancy with other poor reproductive or obstetric history, second trimester: Secondary | ICD-10-CM | POA: Diagnosis not present

## 2015-09-19 DIAGNOSIS — Z3689 Encounter for other specified antenatal screening: Secondary | ICD-10-CM

## 2015-09-27 ENCOUNTER — Encounter: Payer: Medicaid Other | Admitting: Advanced Practice Midwife

## 2015-10-31 ENCOUNTER — Ambulatory Visit (HOSPITAL_COMMUNITY)
Admission: RE | Admit: 2015-10-31 | Discharge: 2015-10-31 | Disposition: A | Discharge status: Home / Self Care | Payer: Medicaid Other | Source: Ambulatory Visit | Attending: Family | Admitting: Family

## 2015-10-31 ENCOUNTER — Encounter (HOSPITAL_COMMUNITY): Payer: Self-pay

## 2015-10-31 VITALS — BP 109/73 | HR 124 | Wt 255.2 lb

## 2015-10-31 DIAGNOSIS — Z36 Encounter for antenatal screening of mother: Secondary | ICD-10-CM | POA: Insufficient documentation

## 2015-10-31 DIAGNOSIS — Z3A00 Weeks of gestation of pregnancy not specified: Secondary | ICD-10-CM | POA: Diagnosis not present

## 2015-10-31 DIAGNOSIS — E669 Obesity, unspecified: Secondary | ICD-10-CM | POA: Insufficient documentation

## 2015-10-31 DIAGNOSIS — O99212 Obesity complicating pregnancy, second trimester: Secondary | ICD-10-CM | POA: Diagnosis not present

## 2015-10-31 DIAGNOSIS — Z0489 Encounter for examination and observation for other specified reasons: Secondary | ICD-10-CM

## 2015-10-31 DIAGNOSIS — IMO0002 Reserved for concepts with insufficient information to code with codable children: Secondary | ICD-10-CM

## 2015-10-31 NOTE — ED Notes (Signed)
Pt reports having frequency and pain with urination x 1 wk.

## 2015-11-02 ENCOUNTER — Encounter: Payer: Medicaid Other | Admitting: Family Medicine

## 2015-11-15 ENCOUNTER — Inpatient Hospital Stay (HOSPITAL_COMMUNITY)
Admission: AD | Admit: 2015-11-15 | Discharge: 2015-11-15 | Disposition: A | Discharge status: Home / Self Care | Payer: Medicaid Other | Source: Ambulatory Visit | Attending: Obstetrics and Gynecology | Admitting: Obstetrics and Gynecology

## 2015-11-15 ENCOUNTER — Encounter (HOSPITAL_COMMUNITY): Payer: Self-pay

## 2015-11-15 DIAGNOSIS — O98312 Other infections with a predominantly sexual mode of transmission complicating pregnancy, second trimester: Secondary | ICD-10-CM | POA: Insufficient documentation

## 2015-11-15 DIAGNOSIS — O99332 Smoking (tobacco) complicating pregnancy, second trimester: Secondary | ICD-10-CM | POA: Insufficient documentation

## 2015-11-15 DIAGNOSIS — Z3A27 27 weeks gestation of pregnancy: Secondary | ICD-10-CM | POA: Diagnosis not present

## 2015-11-15 DIAGNOSIS — A5903 Trichomonal cystitis and urethritis: Secondary | ICD-10-CM

## 2015-11-15 DIAGNOSIS — A599 Trichomoniasis, unspecified: Secondary | ICD-10-CM | POA: Insufficient documentation

## 2015-11-15 DIAGNOSIS — N939 Abnormal uterine and vaginal bleeding, unspecified: Secondary | ICD-10-CM | POA: Diagnosis present

## 2015-11-15 DIAGNOSIS — O219 Vomiting of pregnancy, unspecified: Secondary | ICD-10-CM

## 2015-11-15 DIAGNOSIS — R109 Unspecified abdominal pain: Secondary | ICD-10-CM | POA: Insufficient documentation

## 2015-11-15 DIAGNOSIS — O26899 Other specified pregnancy related conditions, unspecified trimester: Secondary | ICD-10-CM

## 2015-11-15 DIAGNOSIS — O9989 Other specified diseases and conditions complicating pregnancy, childbirth and the puerperium: Secondary | ICD-10-CM

## 2015-11-15 DIAGNOSIS — R112 Nausea with vomiting, unspecified: Secondary | ICD-10-CM | POA: Insufficient documentation

## 2015-11-15 DIAGNOSIS — O26892 Other specified pregnancy related conditions, second trimester: Secondary | ICD-10-CM | POA: Insufficient documentation

## 2015-11-15 DIAGNOSIS — Z113 Encounter for screening for infections with a predominantly sexual mode of transmission: Secondary | ICD-10-CM

## 2015-11-15 LAB — RAPID URINE DRUG SCREEN, HOSP PERFORMED
AMPHETAMINES: NOT DETECTED
Barbiturates: NOT DETECTED
Benzodiazepines: NOT DETECTED
COCAINE: NOT DETECTED
OPIATES: NOT DETECTED
TETRAHYDROCANNABINOL: POSITIVE — AB

## 2015-11-15 LAB — URINALYSIS, ROUTINE W REFLEX MICROSCOPIC
Bilirubin Urine: NEGATIVE
Glucose, UA: NEGATIVE mg/dL
HGB URINE DIPSTICK: NEGATIVE
Ketones, ur: 40 mg/dL — AB
NITRITE: NEGATIVE
PH: 7 (ref 5.0–8.0)
Protein, ur: NEGATIVE mg/dL
SPECIFIC GRAVITY, URINE: 1.025 (ref 1.005–1.030)

## 2015-11-15 LAB — FETAL FIBRONECTIN: Fetal Fibronectin: NEGATIVE

## 2015-11-15 LAB — URINE MICROSCOPIC-ADD ON
Bacteria, UA: NONE SEEN
RBC / HPF: NONE SEEN RBC/hpf (ref 0–5)

## 2015-11-15 LAB — CBC
HEMATOCRIT: 28.2 % — AB (ref 36.0–46.0)
HEMOGLOBIN: 9.5 g/dL — AB (ref 12.0–15.0)
MCH: 20.9 pg — AB (ref 26.0–34.0)
MCHC: 33.7 g/dL (ref 30.0–36.0)
MCV: 62.1 fL — AB (ref 78.0–100.0)
Platelets: 500 10*3/uL — ABNORMAL HIGH (ref 150–400)
RBC: 4.54 MIL/uL (ref 3.87–5.11)
RDW: 18.7 % — ABNORMAL HIGH (ref 11.5–15.5)
WBC: 17.8 10*3/uL — ABNORMAL HIGH (ref 4.0–10.5)

## 2015-11-15 LAB — WET PREP, GENITAL
CLUE CELLS WET PREP: NONE SEEN
Sperm: NONE SEEN
TRICH WET PREP: NONE SEEN
Yeast Wet Prep HPF POC: NONE SEEN

## 2015-11-15 MED ORDER — ONDANSETRON 4 MG PO TBDP: 4.0000 mg | ORAL_TABLET | Freq: Three times a day (TID) | ORAL | 0 refills | Status: DC | PRN

## 2015-11-15 MED ORDER — METRONIDAZOLE 500 MG PO TABS
2000.0000 mg | ORAL_TABLET | Freq: Once | ORAL | Status: AC
  Administered 2015-11-15: 2000 mg via ORAL
  Filled 2015-11-15: qty 4

## 2015-11-15 MED ORDER — LACTATED RINGERS IV BOLUS (SEPSIS): 1000.0000 mL | Freq: Once | INTRAVENOUS | Status: DC

## 2015-11-15 MED ORDER — PROMETHAZINE HCL 25 MG/ML IJ SOLN
25.0000 mg | Freq: Once | INTRAMUSCULAR | Status: AC
  Administered 2015-11-15: 25 mg via INTRAMUSCULAR
  Filled 2015-11-15: qty 1

## 2015-11-15 MED ORDER — SODIUM CHLORIDE 0.9 % IV SOLN
8.0000 mg | Freq: Once | INTRAVENOUS | Status: DC
  Filled 2015-11-15: qty 4

## 2015-11-15 NOTE — Progress Notes (Signed)
RN into room. Pt calm with her boyfriend in room. Cardio adjusted, FHR 145. Pulse ox reapplied to pt. IV attempt x1 in left antecubital with 20 gauge. Pt states she was stuck in ambulance 4 times today and requests no further sticks. NP notified.

## 2015-11-15 NOTE — Progress Notes (Signed)
RN into room to start IV. Pt sitting up in bed crying. Cardio tracing maternal heart rate. Pt requests for RN to leave.

## 2015-11-15 NOTE — MAU Note (Signed)
Pt arried via EMS 1444 with c/o vaginal bleeding this morning and contractions. Pt denies leaking of fluid. Pt states baby is moving normally.

## 2015-11-15 NOTE — Discharge Instructions (Signed)
Preterm Labor Information Preterm labor is when labor starts before you are [redacted] weeks pregnant. The normal length of pregnancy is 39 to 41 weeks.  CAUSES  The cause of preterm labor is not often known. The most common known cause is infection. RISK FACTORS  Having a history of preterm labor.  Having your water break before it should.  Having a placenta that covers the opening of the cervix.  Having a placenta that breaks away from the uterus.  Having a cervix that is too weak to hold the baby in the uterus.  Having too much fluid in the amniotic sac.  Taking drugs or smoking while pregnant.  Not gaining enough weight while pregnant.  Being younger than 4 and older than 23 years old.  Having a low income.  Being African American. SYMPTOMS  Period-like cramps, belly (abdominal) pain, or back pain.  Contractions that are regular, as often as six in an hour. They may be mild or painful.  Contractions that start at the top of the belly. They then move to the lower belly and back.  Lower belly pressure that seems to get stronger.  Bleeding from the vagina.  Fluid leaking from the vagina. TREATMENT  Treatment depends on:  Your condition.  The condition of your baby.  How many weeks pregnant you are. Your doctor may have you:  Take medicine to stop contractions.  Stay in bed except to use the restroom (bed rest).  Stay in the hospital. WHAT SHOULD YOU DO IF YOU THINK YOU ARE IN PRETERM LABOR? Call your doctor right away. You need to go to the hospital right away.  HOW CAN YOU PREVENT PRETERM LABOR IN FUTURE PREGNANCIES?  Stop smoking, if you smoke.  Maintain healthy weight gain.  Do not take drugs or be around chemicals that are not needed.  Tell your doctor if you think you have an infection.  Tell your doctor if you had a preterm labor before.   This information is not intended to replace advice given to you by your health care provider. Make sure you  discuss any questions you have with your health care provider.   Document Released: 05/30/2008 Document Revised: 07/18/2014 Document Reviewed: 04/05/2012 Elsevier Interactive Patient Education 2016 [employer]. Trichomoniasis Trichomoniasis is an infection caused by an organism called Trichomonas. The infection can affect both women and men. In women, the outer female genitalia and the vagina are affected. In men, the penis is mainly affected, but the prostate and other reproductive organs can also be involved. Trichomoniasis is a sexually transmitted infection (STI) and is most often passed to another person through sexual contact.  RISK FACTORS  Having unprotected sexual intercourse.  Having sexual intercourse with an infected partner. SIGNS AND SYMPTOMS  Symptoms of trichomoniasis in women include:  Abnormal gray-green frothy vaginal discharge.  Itching and irritation of the vagina.  Itching and irritation of the area outside the vagina. Symptoms of trichomoniasis in men include:   Penile discharge with or without pain.  Pain during urination. This results from inflammation of the urethra. DIAGNOSIS  Trichomoniasis may be found during a Pap test or physical exam. Your health care provider may use one of the following methods to help diagnose this infection:  Testing the pH of the vagina with a test tape.  Using a vaginal swab test that checks for the Trichomonas organism. A test is available that provides results within a few minutes.  Examining a urine sample.  Testing vaginal secretions. Your  health care provider may test you for other STIs, including HIV. TREATMENT   You may be given medicine to fight the infection. Women should inform their health care provider if they could be or are pregnant. Some medicines used to treat the infection should not be taken during pregnancy.  Your health care provider may recommend over-the-counter medicines or creams to decrease  itching or irritation.  Your sexual partner will need to be treated if infected.  Your health care provider may test you for infection again 3 months after treatment. HOME CARE INSTRUCTIONS   Take medicines only as directed by your health care provider.  Take over-the-counter medicine for itching or irritation as directed by your health care provider.  Do not have sexual intercourse while you have the infection.  Women should not douche or wear tampons while they have the infection.  Discuss your infection with your partner. Your partner may have gotten the infection from you, or you may have gotten it from your partner.  Have your sex partner get examined and treated if necessary.  Practice safe, informed, and protected sex.  See your health care provider for other STI testing. SEEK MEDICAL CARE IF:   You still have symptoms after you finish your medicine.  You develop abdominal pain.  You have pain when you urinate.  You have bleeding after sexual intercourse.  You develop a rash.  Your medicine makes you sick or makes you throw up (vomit). MAKE SURE YOU:  Understand these instructions.  Will watch your condition.  Will get help right away if you are not doing well or get worse.   This information is not intended to replace advice given to you by your health care provider. Make sure you discuss any questions you have with your health care provider.   Document Released: 08/27/2000 Document Revised: 03/24/2014 Document Reviewed: 12/13/2012 Elsevier Interactive Patient Education 2016 [employer].          Expedited Partner Therapy:  Information Sheet for Patients and Partners               You have been offered expedited partner therapy (EPT). This information sheet contains important information and warnings you need to be aware of, so please read it carefully.   Expedited Partner Therapy (EPT) is the clinical practice of treating the sexual partners of  persons who receive chlamydia, gonorrhea, or trichomoniasis diagnoses by providing medications or prescriptions to the patient. Patients then provide partners with these therapies without the health-care provider having examined the partner. In other words, EPT is a convenient, fast and private way for patients to help their sexual partners get treated.   Chlamydia and gonorrhea are bacterial infections you get from having sex with a person who is already infected. Trichomoniasis (or trich) is a very common sexually transmitted infection (STI) that is caused by infection with a protozoan parasite called Trichomonas vaginalis.  Many people with these infections dont know it because they feel fine, but without treatment these infections can cause serious health problems, such as pelvic inflammatory disease, ectopic pregnancy, infertility and increased risk of HIV.   It is important to get treated as soon as possible to protect your health, to avoid spreading these infections to others, and to prevent yourself from becoming re-infected. The good news is these infections can be easily cured with proper antibiotic medicine. The best way to take care of your self is to see a doctor or go to your local health department. If  you are not able to see a doctor or other medical provider, you should take EPT.    Recommended Medication: EPT for Chlamydia:  Azithromycin (Zithromax) 1 gram orally in a single dose EPT for Gonorrhea:  Cefixime (Suprax) 400 milligrams orally in a single dose PLUS azithromycin (Zithromax) 1 gram orally in a single dose EPT for Trichomoniasis:  Metronidazole (Flagyl) 2 grams orally in a single dose   These medicines are very safe. However, you should not take them if you have ever had an allergic reaction (like a rash) to any of these medicines: azithromycin (Zithromax), erythromycin, clarithromycin (Biaxin), metronidazole (Flagyl), tinidazole (Tindimax). If you are uncertain about  whether you have an allergy, call your medical provider or pharmacist before taking this medicine. If you have a serious, long-term illness like kidney, liver or heart disease, colitis or stomach problems, or you are currently taking other prescription medication, talk to your provider before taking this medication.   Women: If you have lower belly pain, pain during sex, vomiting, or a fever, do not take this medicine. Instead, you should see a medical provider to be certain you do not have pelvic inflammatory disease (PID). PID can be serious and lead to infertility, pregnancy problems or chronic pelvic pain.   Pregnant Women: It is very important for you to see a doctor to get pregnancy services and pre-natal care. These antibiotics for EPT are safe for pregnant women, but you still need to see a medical provider as soon as possible. It is also important to note that Doxycycline is an alternative therapy for chlamydia, but it should not be taken by someone who is pregnant.   Men: If you have pain or swelling in the testicles or a fever, do not take this medicine and see a medical provider.     Men who have sex with men (MSM): MSM in [place] continue to experience high rates of syphilis and HIV. Many MSM with gonorrhea or chlamydia could also have syphilis and/or HIV and not know it. If you are a man who has sex with other men, it is very important that you see a medical provider and are tested for HIV and syphilis. EPT is not recommended for gonorrhea for MSM.  Recommended treatment for gonorrhea for MSM is Rocephin (shot) AND azithromycin due to decreased cure rate.  Please see your medical provider if this is the case.    Along with this information sheet is a prescription for the medicine. If you receive a prescription it will be in your name and will indicate your date of birth, or it will be in the name of Expedited Partner Therapy.   In either case, you can have the prescription filled at  a pharmacy. You will be responsible for the cost of the medicine, unless you have prescription drug coverage. In that case, you could provide your name so the pharmacy could bill your health plan.   Take the medication as directed. Some people will have a mild, upset stomach, which does not last long. AVOID alcohol 24 hours after taking metronidazole (Flagyl) to reduce the possibility of a disulfiram-like reaction (severe vomiting and abdominal pain).  After taking the medicine, do not have sex for 7 days. Do not share this medicine or give it to anyone else. It is important to tell everyone you have had sex with in the last 60 days that they need to go and get tested for sexually transmitted infections.   Ways to prevent these  and other sexually transmitted infections (STIs):    Abstain from sex. This is the only sure way to avoid getting an STI.   Use barrier methods, such as condoms, consistently and correctly.   Limit the number of sexual partners.   Have regular physical exams, including testing for STIs.   For more information about EPT or other issues pertaining to an STI, please contact your medical provider or the [facility] at [phone] or [email or web address]

## 2015-11-15 NOTE — MAU Provider Note (Signed)
History     CSN: [id number]  Arrival date and time: 11/15/15 1434   None      Chief Complaint  Patient presents with  . Abdominal Pain  . Vaginal Bleeding   HPI  Christine Singleton is a 25 y.o. G2P1001 at [redacted]w[redacted]d who presents with abdominal pain and vaginal bleeding. Pt has limited PNC as she has no showed for her last 2 visits & hasn't been seen since June. Pt reports lower abdominal cramping since 10 am this morning that now feels constant & is associated with vaginal/rectal pressure. Went to bathroom just prior to calling an ambulance & red spotting on toilet paper. No bleeding since than. Denies n/v/d, constipation, dysuria, vaginal discharge, or LOF. Denies intercourse in the last 24 hours. Positive fetal movement.   OB History    Gravida Para Term Preterm AB Living   2 1 1  0 0 1   SAB TAB Ectopic Multiple Live Births   0 0 0 0 1      Past Medical History:  Diagnosis Date  . Gestational diabetes     Past Surgical History:  Procedure Laterality Date  . CESAREAN SECTION      No family history on file.  Social History  Substance Use Topics  . Smoking status: Current Every Day Smoker  . Smokeless tobacco: Never Used  . Alcohol use No    Allergies: No Known Allergies  Prescriptions Prior to Admission  Medication Sig Dispense Refill Last Dose  . metoCLOPramide (REGLAN) 10 MG tablet Take 1 tablet (10 mg total) by mouth 3 (three) times daily with meals. (Patient not taking: Reported on 08/29/2015) 90 tablet 1 Not Taking  . Prenatal Multivit-Min-Fe-FA (PRENATAL VITAMINS) 0.8 MG tablet Take 1 tablet by mouth daily. 30 tablet 12 Taking    Review of Systems  Constitutional: Negative.   Gastrointestinal: Positive for abdominal pain. Negative for constipation, diarrhea, nausea and vomiting.  Genitourinary: Negative for dysuria.       + vaginal bleeding No LOF or vaginal discharge   Physical Exam   Blood pressure 118/73, pulse 118, temperature 98.2 F (36.8 C),  temperature source Oral, resp. rate 17, height 5\' 2"  (1.575 m), weight 255 lb (115.7 kg), last menstrual period 05/02/2015, SpO2 100 %.  Physical Exam  Nursing note and vitals reviewed. Constitutional: She is oriented to person, place, and time. She appears well-developed and well-nourished. No distress.  HENT:  Head: Normocephalic and atraumatic.  Eyes: Conjunctivae are normal. Right eye exhibits no discharge. Left eye exhibits no discharge. No scleral icterus.  Neck: Normal range of motion.  Respiratory: Effort normal. No respiratory distress.  GI: Soft. There is no tenderness.  Genitourinary: Cervix exhibits no friability. No bleeding in the vagina. Vaginal discharge (small amount of tan yellow frothy discharge) found.  Genitourinary Comments: Cervix visually closed  Neurological: She is alert and oriented to person, place, and time.  Skin: Skin is warm and dry. She is not diaphoretic.  Psychiatric: She has a normal mood and affect. Her behavior is normal. Judgment and thought content normal.   Dilation: Closed Effacement (%): Thick Cervical Position: Middle Station: -3 Exam by:: Judeth Horn NP   Fetal Tracing:  Baseline: 140 Variability: moderate Accelerations: present Decelerations: none   Toco: irregular   MAU Course  Procedures Results for orders placed or performed during the hospital encounter of 11/15/15 (from the past 24 hour(s))  Urinalysis, Routine w reflex microscopic (not at [facility])     Status: Abnormal  Collection Time: 11/15/15  2:34 PM  Result Value Ref Range   Color, Urine YELLOW YELLOW   APPearance HAZY (A) CLEAR   Specific Gravity, Urine 1.025 1.005 - 1.030   pH 7.0 5.0 - 8.0   Glucose, UA NEGATIVE NEGATIVE mg/dL   Hgb urine dipstick NEGATIVE NEGATIVE   Bilirubin Urine NEGATIVE NEGATIVE   Ketones, ur 40 (A) NEGATIVE mg/dL   Protein, ur NEGATIVE NEGATIVE mg/dL   Nitrite NEGATIVE NEGATIVE   Leukocytes, UA MODERATE (A) NEGATIVE  Rapid urine drug  screen (hospital performed)     Status: Abnormal   Collection Time: 11/15/15  2:34 PM  Result Value Ref Range   Opiates NONE DETECTED NONE DETECTED   Cocaine NONE DETECTED NONE DETECTED   Benzodiazepines NONE DETECTED NONE DETECTED   Amphetamines NONE DETECTED NONE DETECTED   Tetrahydrocannabinol POSITIVE (A) NONE DETECTED   Barbiturates NONE DETECTED NONE DETECTED  Urine microscopic-add on     Status: Abnormal   Collection Time: 11/15/15  2:34 PM  Result Value Ref Range   Squamous Epithelial / LPF 0-5 (A) NONE SEEN   WBC, UA 6-30 0 - 5 WBC/hpf   RBC / HPF NONE SEEN 0 - 5 RBC/hpf   Bacteria, UA NONE SEEN NONE SEEN   Trichomonas, UA PRESENT    Urine-Other MUCOUS PRESENT   CBC     Status: Abnormal   Collection Time: 11/15/15  4:49 PM  Result Value Ref Range   WBC 17.8 (H) 4.0 - 10.5 K/uL   RBC 4.54 3.87 - 5.11 MIL/uL   Hemoglobin 9.5 (L) 12.0 - 15.0 g/dL   HCT 16.128.2 (L) 09.636.0 - 04.546.0 %   MCV 62.1 (L) 78.0 - 100.0 fL   MCH 20.9 (L) 26.0 - 34.0 pg   MCHC 33.7 30.0 - 36.0 g/dL   RDW 40.918.7 (H) 81.111.5 - 91.415.5 %   Platelets 500 (H) 150 - 400 K/uL  Wet prep, genital     Status: Abnormal   Collection Time: 11/15/15  5:05 PM  Result Value Ref Range   Yeast Wet Prep HPF POC NONE SEEN NONE SEEN   Trich, Wet Prep NONE SEEN NONE SEEN   Clue Cells Wet Prep HPF POC NONE SEEN NONE SEEN   WBC, Wet Prep HPF POC FEW (A) NONE SEEN   Sperm NONE SEEN   Fetal fibronectin     Status: None   Collection Time: 11/15/15  5:05 PM  Result Value Ref Range   Fetal Fibronectin NEGATIVE NEGATIVE    MDM Cervix closed & thick Category 1 fetal tracing, no contractions U/a & UDS --- u/a shows trich; discussed results with patient, will treat today & complete STI testing Pt nauseated during visit & started vomiting; IV fluids & zofran ordered Pt very difficult stick; unable to start IV; orders changed to phenergan 25 mg IM followed by flagyl 2 gm PO for trich tx Pt reports improvement in symptoms FFN  negative Assessment and Plan  A: 1. Abdominal pain affecting pregnancy   2. Trichomoniasis of bladder   3. Screen for STD (sexually transmitted disease)   4. Pregnancy related nausea and vomiting, antepartum    P: Discharge home  Rx zofran GC/CT, HIV, RPR pending Keep f/u ob appt No intercourse x 1 week Expedited partner tx rx & info sheet given Discussed reasons to return to MAU  Judeth HornErin Ashyla Luth 11/15/2015, 2:50 PM

## 2015-11-16 LAB — RPR: RPR: NONREACTIVE

## 2015-11-16 LAB — HIV ANTIBODY (ROUTINE TESTING W REFLEX): HIV Screen 4th Generation wRfx: NONREACTIVE

## 2015-11-16 LAB — GC/CHLAMYDIA PROBE AMP (~~LOC~~) NOT AT ARMC
Chlamydia: NEGATIVE
Neisseria Gonorrhea: POSITIVE — AB

## 2015-11-27 ENCOUNTER — Ambulatory Visit (INDEPENDENT_AMBULATORY_CARE_PROVIDER_SITE_OTHER): Payer: Medicaid Other | Admitting: Advanced Practice Midwife

## 2015-11-27 ENCOUNTER — Encounter: Payer: Self-pay | Admitting: Advanced Practice Midwife

## 2015-11-27 ENCOUNTER — Ambulatory Visit (INDEPENDENT_AMBULATORY_CARE_PROVIDER_SITE_OTHER): Payer: Self-pay | Admitting: Clinical

## 2015-11-27 VITALS — BP 117/72 | HR 97 | Wt 248.2 lb

## 2015-11-27 DIAGNOSIS — O98211 Gonorrhea complicating pregnancy, first trimester: Secondary | ICD-10-CM | POA: Diagnosis not present

## 2015-11-27 DIAGNOSIS — Z3483 Encounter for supervision of other normal pregnancy, third trimester: Secondary | ICD-10-CM | POA: Diagnosis not present

## 2015-11-27 DIAGNOSIS — Z23 Encounter for immunization: Secondary | ICD-10-CM

## 2015-11-27 DIAGNOSIS — O98219 Gonorrhea complicating pregnancy, unspecified trimester: Secondary | ICD-10-CM

## 2015-11-27 DIAGNOSIS — A5901 Trichomonal vulvovaginitis: Secondary | ICD-10-CM | POA: Diagnosis not present

## 2015-11-27 DIAGNOSIS — Z3493 Encounter for supervision of normal pregnancy, unspecified, third trimester: Secondary | ICD-10-CM

## 2015-11-27 DIAGNOSIS — O98213 Gonorrhea complicating pregnancy, third trimester: Secondary | ICD-10-CM

## 2015-11-27 DIAGNOSIS — O23591 Infection of other part of genital tract in pregnancy, first trimester: Secondary | ICD-10-CM

## 2015-11-27 DIAGNOSIS — F430 Acute stress reaction: Secondary | ICD-10-CM

## 2015-11-27 DIAGNOSIS — O98311 Other infections with a predominantly sexual mode of transmission complicating pregnancy, first trimester: Secondary | ICD-10-CM | POA: Diagnosis not present

## 2015-11-27 DIAGNOSIS — O23599 Infection of other part of genital tract in pregnancy, unspecified trimester: Secondary | ICD-10-CM

## 2015-11-27 LAB — CBC
HEMATOCRIT: 29.4 % — AB (ref 35.0–45.0)
HEMOGLOBIN: 9.4 g/dL — AB (ref 11.7–15.5)
MCH: 20.6 pg — AB (ref 27.0–33.0)
MCHC: 32 g/dL (ref 32.0–36.0)
MCV: 64.3 fL — ABNORMAL LOW (ref 80.0–100.0)
MPV: 9.2 fL (ref 7.5–12.5)
Platelets: 484 10*3/uL — ABNORMAL HIGH (ref 140–400)
RBC: 4.57 MIL/uL (ref 3.80–5.10)
RDW: 19.6 % — ABNORMAL HIGH (ref 11.0–15.0)
WBC: 11.8 10*3/uL — ABNORMAL HIGH (ref 3.8–10.8)

## 2015-11-27 LAB — POCT URINALYSIS DIP (DEVICE)
GLUCOSE, UA: NEGATIVE mg/dL
Nitrite: NEGATIVE
PROTEIN: 100 mg/dL — AB
UROBILINOGEN UA: 1 mg/dL (ref 0.0–1.0)
pH: 6.5 (ref 5.0–8.0)

## 2015-11-27 MED ORDER — CEFTRIAXONE SODIUM 250 MG IJ SOLR: 250.0000 mg | Freq: Once | INTRAMUSCULAR | 0 refills | Status: DC

## 2015-11-27 MED ORDER — CEFTRIAXONE SODIUM 500 MG IJ SOLR
250.0000 mg | Freq: Once | INTRAMUSCULAR | Status: AC
  Administered 2015-11-27: 250 mg via INTRAMUSCULAR

## 2015-11-27 MED ORDER — TETANUS-DIPHTH-ACELL PERTUSSIS 5-2.5-18.5 LF-MCG/0.5 IM SUSP
0.5000 mL | Freq: Once | INTRAMUSCULAR | Status: AC
  Administered 2015-11-27: 0.5 mL via INTRAMUSCULAR

## 2015-11-27 MED ORDER — CEFTRIAXONE SODIUM 500 MG IJ SOLR: 500.0000 mg | Freq: Once | INTRAMUSCULAR | Status: DC

## 2015-11-27 MED ORDER — AZITHROMYCIN 250 MG PO TABS
1000.0000 mg | ORAL_TABLET | Freq: Once | ORAL | Status: AC
  Administered 2015-11-27: 1000 mg via ORAL

## 2015-11-27 MED ORDER — AZITHROMYCIN 250 MG PO TABS: 1000.0000 mg | ORAL_TABLET | Freq: Once | ORAL | Status: DC

## 2015-11-27 NOTE — Addendum Note (Signed)
Addended by: Faythe CasaBELLAMY, Tinita Brooker M on: 11/27/2015 04:48 PM   Modules accepted: Orders

## 2015-11-27 NOTE — Progress Notes (Signed)
   PRENATAL VISIT NOTE  Subjective:  Christine Singleton is a 25 y.o. G2P1001 at 2841w2d being seen today for ongoing prenatal care.  She is currently monitored for the following issues for this high-risk pregnancy and has Supervision of normal pregnancy, antepartum; Previous cesarean delivery affecting pregnancy, antepartum; Hemoglobin C trait (HCC); Trichomonal vaginitis during pregnancy; and Gonorrhea affecting pregnancy on her problem list.  Patient reports no complaints and Cramping has improved.  Contractions: Not present. Vag. Bleeding: None.  Movement: Present. Denies leaking of fluid.   The following portions of the patient's history were reviewed and updated as appropriate: allergies, current medications, past family history, past medical history, past social history, past surgical history and problem list. Problem list updated.  Objective:   Vitals:   11/27/15 1404  BP: 117/72  Pulse: 97  Weight: 248 lb 3.2 oz (112.6 kg)    Fetal Status: Fetal Heart Rate (bpm): 155   Movement: Present     General:  Alert, oriented and cooperative. Patient is in no acute distress.  Skin: Skin is warm and dry. No rash noted.   Cardiovascular: Normal heart rate noted  Respiratory: Normal respiratory effort, no problems with respiration noted  Abdomen: Soft, gravid, appropriate for gestational age. Pain/Pressure: Present     Pelvic:  Cervical exam deferred        Extremities: Normal range of motion.  Edema: None  Mental Status: Normal mood and affect. Normal behavior. Normal judgment and thought content.   Urinalysis:      Assessment and Plan:  Pregnancy: G2P1001 at 8941w2d  1. Supervision of normal pregnancy, third trimester  - Glucose Tolerance, 1 HR (50g) w/o Fasting - RPR - HIV antibody (with reflex) - CBC - Flu Vaccine QUAD 36+ mos IM (Fluarix & Fluzone Quad PF  2. Trichomonal vaginitis during pregnancy, first trimester      Treated in MAU  3. Gonorrhea affecting pregnancy  Treated in clinic today      Tearful over FOB situation      Wants to see Christine Singleton today  Preterm labor symptoms and general obstetric precautions including but not limited to vaginal bleeding, contractions, leaking of fluid and fetal movement were reviewed in detail with the patient. Please refer to After Visit Summary for other counseling recommendations.  RTC 2 weeks Christine Singleton, CNM

## 2015-11-27 NOTE — Patient Instructions (Signed)
Glucose Tolerance Test The glucose tolerance test (GTT) is one of several tests used to diagnose diabetes mellitus. The GTT is a blood test, and it may include a urine test as well. The GTT checks to see how your body processes sugar (glucose). For this test, you will consume a drink containing a high level of glucose. Your blood glucose levels will be checked before you consume the drink and then again 1, 2, 3, and possibly 4 hours after you consume it. Your health care provider may recommend that you have the GTT if you:  Have a family history of diabetes.   Are very overweight (obese).   Have experienced infections that keep coming back.   Have had numerous cuts or wounds that did not heal quickly, especially on your legs and feet.   Are a woman and have a history of giving birth to very large babies or a history of repeated fetal loss (stillbirth).  Have had glucose in your urine or high blood sugar:   During pregnancy.   After a heart attack, surgery, or prolonged periods of high stress.  The GTT lasts 3-4 hours. Other than the glucose solution, you will not be allowed to eat or drink anything during the test. You must remain at the testing location to make sure that your blood and urine samples are taken on time. PREPARATION FOR TEST Eat normally for 3 days prior to the GTT test, including having plenty of carbohydrate-rich foods. Do not eat or drink anything except water during the final 12 hours before the test. You should not smoke or exercise during the test. In addition, your health care provider may ask you to stop taking certain medicines before the test. RESULTS It is your responsibility to obtain your test results. Ask the lab or department performing the test when and how you will get your results. Contact your health care provider to discuss any questions you have about your results. Range of Normal Values Ranges for normal values may vary among different labs and  hospitals. You should always check with your health care provider after having lab work or other tests done to discuss whether your values are considered within normal limits.  Normal levels of blood glucose are as follows:  Fasting: less than 110 mg/dL or less than 6.1 mmol/L (SI units).  1 hour after consuming the glucose drink: less than 200 mg/dL or less than 11.1 mmol/L.  2 hours after consuming the glucose drink: less than 140 mg/dL or less than 7.8 mmol/L.  3 hours after consuming the glucose drink: 70-115 mg/dL or less than 6.4 mmol/L.  4 hours after consuming the glucose drink: 70-115 mg/dL or less than 6.4 mmol/L. The normal result for the urine test is negative, meaning that glucose is absent from your urine. Some substances can interfere with GTT results. These may include:  Blood pressure and heart failure medicines, including beta blockers, furosemide, and thiazides.   Anti-inflammatory medicines, including aspirin.   Nicotine.   Some psychiatric medicines.   Oral contraceptives.   Diuretics or corticosteroids. Meaning of Results Outside Normal Value Ranges GTT test results that are above normal values may indicate health problems, such as:  Diabetes mellitus.   Acute stress response.   Cushing syndrome.   Tumors such as pheochromocytoma or glucagonoma.   Chronic renal failure.   Pancreatitis.   Hyperthyroidism.   Current infection.  Discuss your test results with your health care provider. He or she will use the results   to make a diagnosis and determine a treatment plan that is right for you.   This information is not intended to replace advice given to you by your health care provider. Make sure you discuss any questions you have with your health care provider.   Document Released: 03/26/2004 Document Revised: 03/24/2014 Document Reviewed: 07/08/2013 Elsevier Interactive Patient Education [employer]2016 Elsevier Inc. Gonorrhea Gonorrhea is an  infection that can cause serious problems. If left untreated, the infection may:   Damage the female or female organs.   Cause women to be unable to have children (sterility).   Harm a fetus if the infected woman is pregnant.  It is important to get treatment for gonorrhea as soon as possible. It is also necessary that all your sexual partners be tested for the infection.  CAUSES  Gonorrhea is caused by bacteria called Neisseria gonorrhoeae. The infection is spread from person to person, usually by sexual contact (such as by anal, vaginal, or oral means). A newborn can contract the infection from his or her mother during birth.  RISK FACTORS  Being a woman younger than 23 years of age who is sexually active.  Being a woman 23 years of age or older who has:  A new sex partner.  More than one sex partner.  A sex partner who has a sexually transmitted disease (STD).  Using condoms inconsistently.  Currently having, or having previously had, an STD.  Exchanging sex or money or drugs. SYMPTOMS  Some people with gonorrhea do not have symptoms. Symptoms may be different in females and males.  Females The most common symptoms are:   Pain in the lower abdomen.   Fever with or without chills.  Other symptoms include:   Abnormal vaginal discharge.   Painful intercourse.   Burning or itching of the vagina or lips of the vagina.   Abnormal vaginal bleeding.   Pain when urinating.   Long-lasting (chronic) pain in the lower abdomen, especially during menstruation or intercourse.   Inability to become pregnant.   Going into premature labor.   Irritation, pain, bleeding, or discharge from the rectum. This may occur if the infection was spread by anal sex.   Sore throat or swollen lymph nodes in the neck. This may occur if the infection was spread by oral sex.  Males The most common symptoms are:   Discharge from the penis.   Pain or burning during  urination.   Pain or swelling in the testicles. Other symptoms may include:   Irritation, pain, bleeding, or discharge from the rectum. This may occur if the infection was spread by anal sex.   Sore throat, fever, or swollen lymph nodes in the neck. This may occur if the infection was spread by oral sex.  DIAGNOSIS  A diagnosis is made after a physical exam is done and a sample of discharge is examined under a microscope for the presence of the bacteria. The discharge may be taken from the urethra, cervix, throat, or rectum.  TREATMENT  Gonorrhea is treated with antibiotic medicines. It is important for treatment to begin as soon as possible. Early treatment may prevent some problems from developing. Do not have sex. Avoid all types of sexual activity for 7 days after treatment is complete and until any sex partners have been treated. HOME CARE INSTRUCTIONS   Take medicines only as directed by your health care provider.   Take your antibiotic medicine as directed by your health care provider. Finish the antibiotic even if  you start to feel better. Incomplete treatment will put you at risk for continued infection.   Do not have sex until treatment is complete or as directed by your health care provider.   Keep all follow-up visits as directed by your health care provider.   Not all test results are available during your visit. If your test results are not back during the visit, make an appointment with your health care provider to find out the results. Do not assume everything is normal if you have not heard from your health care provider or the medical facility. It is your responsibility to get your test results.  If you test positive for gonorrhea, inform your recent sexual partners. They need to be checked for gonorrhea even if they do not have symptoms. They may need treatment, even if they test negative for gonorrhea.  SEEK MEDICAL CARE IF:   You develop any bad reaction to the  medicine you were prescribed. This may include:   A rash.   Nausea.   Vomiting.   Diarrhea.   Your symptoms do not improve after a few days of taking antibiotics.   Your symptoms get worse.   You develop increased pain, such as in the testicles (for males) or in the abdomen (for females).  You have a fever. MAKE SURE YOU:   Understand these instructions.  Will watch your condition.  Will get help right away if you are not doing well or get worse.   This information is not intended to replace advice given to you by your health care provider. Make sure you discuss any questions you have with your health care provider.   Document Released: 02/29/2000 Document Revised: 03/24/2014 Document Reviewed: 09/08/2012 Elsevier Interactive Patient Education [employer]

## 2015-11-27 NOTE — Progress Notes (Signed)
28 wk labs today  28 wk packet given  tdap and flu vaccine  STD tx today

## 2015-11-27 NOTE — Progress Notes (Signed)
  ASSESSMENT: Pt currently experiencing Acute stress reaction. Pt would benefit from brief therapeutic intervention and psychoeducation regarding coping with Acute stress reaction. Pt may benefit from community resources.  Stage of Change: contemplative  PLAN: 1. F/U with behavioral health clinician as needed 2. Psychiatric Medications: none 3. Behavioral recommendations:   -Practice daily relaxation breathing exercises, as practiced in office visit, as needed throughout the day -Remember importance of self care for overall wellbeing -Consider reading educational material regarding coping with symptoms of anxiety and depression -Consider [employer] housing hotline for housing/eviction questions  SUBJECTIVE: Pt. referred by Wynelle BourgeoisMarie Williams, CNM, for coping with recent health diagnosis Pt. reports the following symptoms/concerns: Pt states that she is feeling overwhelmed with recent health diagnosis and possible eviction, and that it helps to "let it out" with an impartial person; wants to learn additional strategy for coping with overwhelming emotions, to "keep it together" for her family, as she admits to current feelings of depression and anxiousness today.  Duration of problem: less than one day Severity: moderate   OBJECTIVE: Orientation & Cognition: Oriented x3. Thought processes normal and appropriate to situation. Mood: appropriate Affect: appropriate Appearance: appropriate Risk of harm to self or others: no known risk of harm to self or others Substance use: none Assessments administered: PHQ9: 3/ GAD7: 2  Diagnosis: Acute stress reaction CPT Code: F43.0  -------------------------------------------- Other(s) present in the room: none  Time spent with patient in exam room: 20 minutes, 2:50-3:10pm  [facility] 11/27/2015  Decreased Interest 1  Down, Depressed, Hopeless 0  PHQ - 2 Score 1  Altered sleeping 1  Tired, decreased energy 1   Change in appetite 0  Feeling bad or failure about yourself  0  Trouble concentrating 0  Moving slowly or fidgety/restless 0  Suicidal thoughts 0  PHQ-9 Score 3   GAD 7 : Generalized Anxiety Score 11/27/2015  Nervous, Anxious, on Edge 0  Control/stop worrying 0  Worry too much - different things 1  Trouble relaxing 0  Restless 0  Easily annoyed or irritable 1  Afraid - awful might happen 0  Total GAD 7 Score 2

## 2015-11-28 ENCOUNTER — Encounter (HOSPITAL_COMMUNITY): Payer: Self-pay

## 2015-11-28 ENCOUNTER — Ambulatory Visit (HOSPITAL_COMMUNITY)
Admission: RE | Admit: 2015-11-28 | Discharge: 2015-11-28 | Disposition: A | Discharge status: Home / Self Care | Payer: Medicaid Other | Source: Ambulatory Visit | Attending: Family | Admitting: Family

## 2015-11-28 LAB — RPR

## 2015-11-28 LAB — HIV ANTIBODY (ROUTINE TESTING W REFLEX): HIV 1&2 Ab, 4th Generation: NONREACTIVE

## 2015-11-28 LAB — GLUCOSE TOLERANCE, 1 HOUR (50G) W/O FASTING: Glucose, 1 Hr, gestational: 94 mg/dL (ref <140)

## 2015-12-03 ENCOUNTER — Other Ambulatory Visit: Payer: Self-pay | Admitting: Advanced Practice Midwife

## 2015-12-03 MED ORDER — FERROUS SULFATE 325 (65 FE) MG PO TABS: 325.0000 mg | ORAL_TABLET | Freq: Every day | ORAL | Status: DC

## 2015-12-03 NOTE — Progress Notes (Signed)
Rx Ferrous sulfate for anemia Hgb 9.4

## 2015-12-13 ENCOUNTER — Encounter: Payer: Self-pay | Admitting: Family Medicine

## 2015-12-14 ENCOUNTER — Encounter: Payer: Self-pay | Admitting: Family Medicine

## 2015-12-17 ENCOUNTER — Encounter: Payer: Self-pay | Admitting: Obstetrics and Gynecology

## 2015-12-17 ENCOUNTER — Ambulatory Visit (INDEPENDENT_AMBULATORY_CARE_PROVIDER_SITE_OTHER): Payer: Self-pay | Admitting: Obstetrics and Gynecology

## 2015-12-17 VITALS — BP 110/79 | HR 124 | Wt 252.0 lb

## 2015-12-17 DIAGNOSIS — Z3483 Encounter for supervision of other normal pregnancy, third trimester: Secondary | ICD-10-CM

## 2015-12-17 DIAGNOSIS — O34219 Maternal care for unspecified type scar from previous cesarean delivery: Secondary | ICD-10-CM

## 2015-12-17 DIAGNOSIS — Z3480 Encounter for supervision of other normal pregnancy, unspecified trimester: Secondary | ICD-10-CM

## 2015-12-17 DIAGNOSIS — O98213 Gonorrhea complicating pregnancy, third trimester: Secondary | ICD-10-CM

## 2015-12-17 NOTE — Progress Notes (Signed)
   PRENATAL VISIT NOTE  Subjective:  Christine Singleton is a 25 y.o. G2P1001 at 5141w1d being seen today for ongoing prenatal care.  She is currently monitored for the following issues for this high-risk pregnancy and has Supervision of normal pregnancy, antepartum; Previous cesarean delivery affecting pregnancy, antepartum; Hemoglobin C trait (HCC); Trichomonal vaginitis during pregnancy; and Gonorrhea affecting pregnancy on her problem list.  Patient reports no complaints.   .  .  Movement: Present. Denies leaking of fluid.   The following portions of the patient's history were reviewed and updated as appropriate: allergies, current medications, past family history, past medical history, past social history, past surgical history and problem list. Problem list updated.  Objective:   Vitals:   12/17/15 1058  BP: 110/79  Pulse: (!) 124  Weight: 252 lb (114.3 kg)    Fetal Status: Fetal Heart Rate (bpm): 139 Fundal Height: 33 cm Movement: Present     General:  Alert, oriented and cooperative. Patient is in no acute distress.  Skin: Skin is warm and dry. No rash noted.   Cardiovascular: Normal heart rate noted  Respiratory: Normal respiratory effort, no problems with respiration noted  Abdomen: Soft, gravid, appropriate for gestational age. Pain/Pressure: Present     Pelvic:  Cervical exam deferred        Extremities: Normal range of motion.  Edema: None  Mental Status: Normal mood and affect. Normal behavior. Normal judgment and thought content.   Urinalysis:      Assessment and Plan:  Pregnancy: G2P1001 at 7841w1d  1. Supervision of other normal pregnancy, antepartum Patient is doing well without complaints Patient declined flu and Tdap vaccine  2. Previous cesarean delivery affecting pregnancy, antepartum Desires TOLAC  3. Gonorrhea affecting pregnancy in third trimester TOC at next visit  Preterm labor symptoms and general obstetric precautions including but not limited to  vaginal bleeding, contractions, leaking of fluid and fetal movement were reviewed in detail with the patient. Please refer to After Visit Summary for other counseling recommendations.  Return in about 2 weeks (around 12/31/2015).  Catalina AntiguaPeggy Deniah Saia, MD

## 2015-12-31 ENCOUNTER — Encounter: Payer: Self-pay | Admitting: Obstetrics and Gynecology

## 2016-01-07 ENCOUNTER — Encounter: Payer: Self-pay | Admitting: Obstetrics and Gynecology

## 2016-01-07 NOTE — Progress Notes (Signed)
Patient did not keep OB appointment for 01/07/2016.  Christine Singleton, Jr MD [facility]ending [facility])

## 2016-01-09 ENCOUNTER — Ambulatory Visit (INDEPENDENT_AMBULATORY_CARE_PROVIDER_SITE_OTHER): Payer: Medicaid Other | Admitting: Family Medicine

## 2016-01-09 VITALS — BP 113/76 | HR 109 | Wt 255.0 lb

## 2016-01-09 DIAGNOSIS — O9989 Other specified diseases and conditions complicating pregnancy, childbirth and the puerperium: Secondary | ICD-10-CM | POA: Diagnosis not present

## 2016-01-09 DIAGNOSIS — Z3480 Encounter for supervision of other normal pregnancy, unspecified trimester: Secondary | ICD-10-CM

## 2016-01-09 DIAGNOSIS — Z3483 Encounter for supervision of other normal pregnancy, third trimester: Secondary | ICD-10-CM

## 2016-01-09 DIAGNOSIS — O34219 Maternal care for unspecified type scar from previous cesarean delivery: Secondary | ICD-10-CM

## 2016-01-09 DIAGNOSIS — M9905 Segmental and somatic dysfunction of pelvic region: Secondary | ICD-10-CM | POA: Diagnosis not present

## 2016-01-09 DIAGNOSIS — M9903 Segmental and somatic dysfunction of lumbar region: Secondary | ICD-10-CM | POA: Diagnosis not present

## 2016-01-09 DIAGNOSIS — M9904 Segmental and somatic dysfunction of sacral region: Secondary | ICD-10-CM | POA: Diagnosis not present

## 2016-01-09 NOTE — Progress Notes (Signed)
   PRENATAL VISIT NOTE  Subjective:  Christine Singleton is a 25 y.o. G2P1001 at 1537w3d being seen today for ongoing prenatal care.  She is currently monitored for the following issues for this low-risk pregnancy and has Supervision of normal pregnancy, antepartum; Previous cesarean delivery affecting pregnancy, antepartum; Hemoglobin C trait (HCC); Trichomonal vaginitis during pregnancy; and Gonorrhea affecting pregnancy on her problem list.  Patient reports backpain bilaterally x10 days. Radiates into buttocks. Worse with standing.  Contractions: Irregular. Vag. Bleeding: None.  Movement: Present. Denies leaking of fluid.   The following portions of the patient's history were reviewed and updated as appropriate: allergies, current medications, past family history, past medical history, past social history, past surgical history and problem list. Problem list updated.  Objective:   Vitals:   01/09/16 1027  BP: 113/76  Pulse: (!) 109  Weight: 255 lb (115.7 kg)    Fetal Status: Fetal Heart Rate (bpm): 145   Movement: Present     General:  Alert, oriented and cooperative. Patient is in no acute distress.  Skin: Skin is warm and dry. No rash noted.   Cardiovascular: Normal heart rate noted  Respiratory: Normal respiratory effort, no problems with respiration noted  Abdomen: Soft, gravid, appropriate for gestational age. Pain/Pressure: Present     Pelvic:  Cervical exam deferred        MSK: Muscle spasm in the lumbar spine  OMT: L5 ESRR, L/L torsion, Right ant innom  Extremities: Normal range of motion.  Edema: None  Mental Status: Normal mood and affect. Normal behavior. Normal judgment and thought content.   Assessment and Plan:  Pregnancy: G2P1001 at 1237w3d  1. Supervision of other normal pregnancy, antepartum FHT and FH normal.   2. Previous cesarean delivery affecting pregnancy, antepartum Schedule at 39 weeks.  3. Somatic dysfunction of lumbar region 4. Somatic dysfunction of  sacral region 5. Somatic dysfunction of pelvis region OMT done after patient permission. Pt tolerated well. HVLA and articulatory technique used.   Preterm labor symptoms and general obstetric precautions including but not limited to vaginal bleeding, contractions, leaking of fluid and fetal movement were reviewed in detail with the patient. Please refer to After Visit Summary for other counseling recommendations.  Return in about 1 week (around 01/16/2016) for LR OB f/u.  Levie HeritageJacob J Saleh Ulbrich, DO

## 2016-01-09 NOTE — Progress Notes (Signed)
Breastfeeding discussed with patient  

## 2016-01-09 NOTE — Patient Instructions (Signed)
Etonogestrel implant What is this medicine? ETONOGESTREL (et oh noe JES trel) is a contraceptive (birth control) device. It is used to prevent pregnancy. It can be used for up to 3 years. This medicine may be used for other purposes; ask your health care provider or pharmacist if you have questions. What should I tell my health care provider before I take this medicine? They need to know if you have any of these conditions: -abnormal vaginal bleeding -blood vessel disease or blood clots -cancer of the breast, cervix, or liver -depression -diabetes -gallbladder disease -headaches -heart disease or recent heart attack -high blood pressure -high cholesterol -kidney disease -liver disease -renal disease -seizures -tobacco smoker -an unusual or allergic reaction to etonogestrel, other hormones, anesthetics or antiseptics, medicines, foods, dyes, or preservatives -pregnant or trying to get pregnant -breast-feeding How should I use this medicine? This device is inserted just under the skin on the inner side of your upper arm by a health care professional. Talk to your pediatrician regarding the use of this medicine in children. Special care may be needed. Overdosage: If you think you have taken too much of this medicine contact a poison control center or emergency room at once. NOTE: This medicine is only for you. Do not share this medicine with others. What if I miss a dose? This does not apply. What may interact with this medicine? Do not take this medicine with any of the following medications: -amprenavir -bosentan -fosamprenavir This medicine may also interact with the following medications: -barbiturate medicines for inducing sleep or treating seizures -certain medicines for fungal infections like ketoconazole and itraconazole -griseofulvin -medicines to treat seizures like carbamazepine, felbamate, oxcarbazepine, phenytoin,  topiramate -modafinil -phenylbutazone -rifampin -some medicines to treat HIV infection like atazanavir, indinavir, lopinavir, nelfinavir, tipranavir, ritonavir -St. John's wort This list may not describe all possible interactions. Give your health care provider a list of all the medicines, herbs, non-prescription drugs, or dietary supplements you use. Also tell them if you smoke, drink alcohol, or use illegal drugs. Some items may interact with your medicine. What should I watch for while using this medicine? This product does not protect you against HIV infection (AIDS) or other sexually transmitted diseases. You should be able to feel the implant by pressing your fingertips over the skin where it was inserted. Contact your doctor if you cannot feel the implant, and use a non-hormonal birth control method (such as condoms) until your doctor confirms that the implant is in place. If you feel that the implant may have broken or become bent while in your arm, contact your healthcare provider. What side effects may I notice from receiving this medicine? Side effects that you should report to your doctor or health care professional as soon as possible: -allergic reactions like skin rash, itching or hives, swelling of the face, lips, or tongue -breast lumps -changes in emotions or moods -depressed mood -heavy or prolonged menstrual bleeding -pain, irritation, swelling, or bruising at the insertion site -scar at site of insertion -signs of infection at the insertion site such as fever, and skin redness, pain or discharge -signs of pregnancy -signs and symptoms of a blood clot such as breathing problems; changes in vision; chest pain; severe, sudden headache; pain, swelling, warmth in the leg; trouble speaking; sudden numbness or weakness of the face, arm or leg -signs and symptoms of liver injury like dark yellow or brown urine; general ill feeling or flu-like symptoms; light-colored stools; loss of  appetite; nausea; right upper belly   pain; unusually weak or tired; yellowing of the eyes or skin -unusual vaginal bleeding, discharge -signs and symptoms of a stroke like changes in vision; confusion; trouble speaking or understanding; severe headaches; sudden numbness or weakness of the face, arm or leg; trouble walking; dizziness; loss of balance or coordination Side effects that usually do not require medical attention (Report these to your doctor or health care professional if they continue or are bothersome.): -acne -back pain -breast pain -changes in weight -dizziness -general ill feeling or flu-like symptoms -headache -irregular menstrual bleeding -nausea -sore throat -vaginal irritation or inflammation This list may not describe all possible side effects. Call your doctor for medical advice about side effects. You may report side effects to FDA at 1-800-FDA-1088. Where should I keep my medicine? This drug is given in a hospital or clinic and will not be stored at home. NOTE: This sheet is a summary. It may not cover all possible information. If you have questions about this medicine, talk to your doctor, pharmacist, or health care provider.    2016, Elsevier/Gold Standard. (2013-12-16 14:07:06) Intrauterine Device Information An intrauterine device (IUD) is inserted into your uterus to prevent pregnancy. There are two types of IUDs available:   Copper IUD--This type of IUD is wrapped in copper wire and is placed inside the uterus. Copper makes the uterus and fallopian tubes produce a fluid that kills sperm. The copper IUD can stay in place for 10 years.  Hormone IUD--This type of IUD contains the hormone progestin (synthetic progesterone). The hormone thickens the cervical mucus and prevents sperm from entering the uterus. It also thins the uterine lining to prevent implantation of a fertilized egg. The hormone can weaken or kill the sperm that get into the uterus. One type of  hormone IUD can stay in place for 5 years, and another type can stay in place for 3 years. Your health care provider will make sure you are a good candidate for a contraceptive IUD. Discuss with your health care provider the possible side effects.  ADVANTAGES OF AN INTRAUTERINE DEVICE  IUDs are highly effective, reversible, long acting, and low maintenance.   There are no estrogen-related side effects.   An IUD can be used when breastfeeding.   IUDs are not associated with weight gain.   The copper IUD works immediately after insertion.   The hormone IUD works right away if inserted within 7 days of your period starting. You will need to use a backup method of birth control for 7 days if the hormone IUD is inserted at any other time in your cycle.  The copper IUD does not interfere with your female hormones.   The hormone IUD can make heavy menstrual periods lighter and decrease cramping.   The hormone IUD can be used for 3 or 5 years.   The copper IUD can be used for 10 years. DISADVANTAGES OF AN INTRAUTERINE DEVICE  The hormone IUD can be associated with irregular bleeding patterns.   The copper IUD can make your menstrual flow heavier and more painful.   You may experience cramping and vaginal bleeding after insertion.    This information is not intended to replace advice given to you by your health care provider. Make sure you discuss any questions you have with your health care provider.   Document Released: 02/05/2004 Document Revised: 11/03/2012 Document Reviewed: 08/22/2012 Elsevier Interactive Patient Education 2016 Elsevier Inc.  

## 2016-01-10 ENCOUNTER — Encounter (HOSPITAL_COMMUNITY): Payer: Self-pay | Admitting: *Deleted

## 2016-01-16 ENCOUNTER — Ambulatory Visit (INDEPENDENT_AMBULATORY_CARE_PROVIDER_SITE_OTHER): Payer: Medicaid Other | Admitting: Obstetrics and Gynecology

## 2016-01-16 VITALS — BP 114/66 | HR 131 | Wt 252.0 lb

## 2016-01-16 DIAGNOSIS — Z3483 Encounter for supervision of other normal pregnancy, third trimester: Secondary | ICD-10-CM

## 2016-01-16 DIAGNOSIS — O34219 Maternal care for unspecified type scar from previous cesarean delivery: Secondary | ICD-10-CM | POA: Diagnosis not present

## 2016-01-16 DIAGNOSIS — Z3480 Encounter for supervision of other normal pregnancy, unspecified trimester: Secondary | ICD-10-CM

## 2016-01-16 DIAGNOSIS — Z113 Encounter for screening for infections with a predominantly sexual mode of transmission: Secondary | ICD-10-CM

## 2016-01-16 NOTE — Progress Notes (Signed)
   PRENATAL VISIT NOTE  Subjective:  Christine Singleton is a 25 y.o. G2P100[name] at [redacted]w[redacted]d being seen today for ongoing prenatal care.  She is currently monitored for the following issues for this low-risk pregnancy and has Supervision of normal pregnancy, antepartum; Previous cesarean delivery affecting pregnancy, antepartum; Hemoglobin C trait (HCC); Trichomonal vaginitis during pregnancy; and Gonorrhea affecting pregnancy on her problem list.  Patient reports no complaints.  Contractions: Irregular. Vag. Bleeding: None.  Movement: Present. Denies leaking of fluid.  Asymptomatic for palpitations, heart racing, SOB.   The following portions of the patient's history were reviewed and updated as appropriate: allergies, current medications, past family history, past medical history, past social history, past surgical history and problem list. Problem list updated.  Objective:   Vitals:   01/16/16 1549  BP: 114/66  Pulse: (!) 131  Weight: 252 lb (114.3 kg)   Recheck pulse 120. Fetal Status: Fetal Heart Rate (bpm): 152   Movement: Present     General:  Alert, oriented and cooperative. Patient is in no acute distress.  Skin: Skin is warm and dry. No rash noted.   Cardiovascular: Normal heart rate noted  Respiratory: Normal respiratory effort, no problems with respiration noted  Abdomen: Soft, gravid, appropriate for gestational age. Pain/Pressure: Present     Pelvic:  Cervical exam performed        Extremities: Normal range of motion.  Edema: None  Mental Status: Normal mood and affect. Normal behavior. Normal judgment and thought content.   Assessment and Plan:  Pregnancy: G2P1001 at 893w3d  1. Supervision of other normal pregnancy, antepartum Doing well - GC/Chlamydia probe amp ([place] - Culture, beta strep (group b only)  2. Previous cesarean delivery affecting pregnancy, antepartum ER CS for 02/03/16  Preterm labor symptoms and general obstetric precautions including  but not limited to vaginal bleeding, contractions, leaking of fluid and fetal movement were reviewed in detail with the patient. Please refer to After Visit Summary for other counseling recommendations.  Return in about 1 week (around 01/23/2016).  Danae Orleanseirdre C Poe, CNM

## 2016-01-16 NOTE — Patient Instructions (Signed)

## 2016-01-17 LAB — GC/CHLAMYDIA PROBE AMP (~~LOC~~) NOT AT ARMC
Chlamydia: NEGATIVE
NEISSERIA GONORRHEA: NEGATIVE

## 2016-01-18 LAB — CULTURE, BETA STREP (GROUP B ONLY)

## 2016-01-23 ENCOUNTER — Telehealth (HOSPITAL_COMMUNITY): Payer: Self-pay | Admitting: *Deleted

## 2016-01-23 NOTE — Telephone Encounter (Signed)
Preadmission screen  

## 2016-01-25 ENCOUNTER — Telehealth (HOSPITAL_COMMUNITY): Payer: Self-pay | Admitting: *Deleted

## 2016-01-25 NOTE — Telephone Encounter (Signed)
Preadmission screen  

## 2016-01-28 ENCOUNTER — Telehealth (HOSPITAL_COMMUNITY): Payer: Self-pay | Admitting: *Deleted

## 2016-01-28 NOTE — Telephone Encounter (Signed)
Preadmission screen email sent

## 2016-01-29 ENCOUNTER — Encounter (HOSPITAL_COMMUNITY)
Admission: RE | Admit: 2016-01-29 | Discharge: 2016-01-29 | Disposition: A | Discharge status: Home / Self Care | Payer: Medicaid Other | Source: Ambulatory Visit | Attending: Obstetrics & Gynecology | Admitting: Obstetrics & Gynecology

## 2016-01-29 ENCOUNTER — Ambulatory Visit (INDEPENDENT_AMBULATORY_CARE_PROVIDER_SITE_OTHER): Payer: Self-pay | Admitting: Obstetrics & Gynecology

## 2016-01-29 ENCOUNTER — Encounter (HOSPITAL_COMMUNITY): Payer: Self-pay | Admitting: *Deleted

## 2016-01-29 ENCOUNTER — Inpatient Hospital Stay (HOSPITAL_COMMUNITY)
Admission: AD | Admit: 2016-01-29 | Discharge: 2016-01-30 | Disposition: A | Discharge status: Home / Self Care | Payer: Medicaid Other | Source: Ambulatory Visit | Attending: Obstetrics and Gynecology | Admitting: Obstetrics and Gynecology

## 2016-01-29 VITALS — BP 126/77 | HR 125 | Wt 256.7 lb

## 2016-01-29 DIAGNOSIS — O98213 Gonorrhea complicating pregnancy, third trimester: Secondary | ICD-10-CM

## 2016-01-29 DIAGNOSIS — Z3A39 39 weeks gestation of pregnancy: Secondary | ICD-10-CM | POA: Insufficient documentation

## 2016-01-29 DIAGNOSIS — O34219 Maternal care for unspecified type scar from previous cesarean delivery: Secondary | ICD-10-CM

## 2016-01-29 DIAGNOSIS — Z3480 Encounter for supervision of other normal pregnancy, unspecified trimester: Secondary | ICD-10-CM

## 2016-01-29 DIAGNOSIS — A5901 Trichomonal vulvovaginitis: Secondary | ICD-10-CM

## 2016-01-29 DIAGNOSIS — O368130 Decreased fetal movements, third trimester, not applicable or unspecified: Secondary | ICD-10-CM

## 2016-01-29 DIAGNOSIS — Z3493 Encounter for supervision of normal pregnancy, unspecified, third trimester: Secondary | ICD-10-CM | POA: Insufficient documentation

## 2016-01-29 DIAGNOSIS — O23591 Infection of other part of genital tract in pregnancy, first trimester: Secondary | ICD-10-CM

## 2016-01-29 NOTE — Progress Notes (Signed)
CS is scheduled at 39 weeks although she had planned TOLAC    PRENATAL VISIT NOTE  Subjective:  Christine Singleton is a 25 y.o. G2P1001 at 4280w2d being seen today for ongoing prenatal care.  She is currently monitored for the following issues for this low-risk pregnancy and has Supervision of normal pregnancy, antepartum; Previous cesarean delivery affecting pregnancy, antepartum; Hemoglobin C trait (HCC); Trichomonal vaginitis during pregnancy; and Gonorrhea affecting pregnancy on her problem list.  Patient reports no complaints.  Contractions: Irregular. Vag. Bleeding: None.  Movement: (!) Decreased. Denies leaking of fluid.   The following portions of the patient's history were reviewed and updated as appropriate: allergies, current medications, past family history, past medical history, past social history, past surgical history and problem list. Problem list updated.  Objective:   Vitals:   01/29/16 1049  BP: 126/77  Pulse: (!) 125  Weight: 256 lb 11.2 oz (116.4 kg)    Fetal Status:     Movement: (!) Decreased     General:  Alert, oriented and cooperative. Patient is in no acute distress.  Skin: Skin is warm and dry. No rash noted.   Cardiovascular: Normal heart rate noted  Respiratory: Normal respiratory effort, no problems with respiration noted  Abdomen: Soft, gravid, appropriate for gestational age. Pain/Pressure: Present     Pelvic:  Cervical exam deferred        Extremities: Normal range of motion.  Edema: Deep pitting, indentation remains for a short time  Mental Status: Normal mood and affect. Normal behavior. Normal judgment and thought content.   Assessment and Plan:  Pregnancy: G2P1001 at 580w2d  1. Previous cesarean delivery affecting pregnancy, antepartum Info on VBAC given, is still candidate, had signed TOLAC consent in June  2. Supervision of other normal pregnancy, antepartum NST today for reported decrease movement  Term labor symptoms and general obstetric  precautions including but not limited to vaginal bleeding, contractions, leaking of fluid and fetal movement were reviewed in detail with the patient. Please refer to After Visit Summary for other counseling recommendations.  RTC 1 week if not delivered   Adam PhenixJames G Tiago Humphrey, MD

## 2016-01-29 NOTE — Patient Instructions (Signed)
20 Elfrida Coulibaly  01/29/2016   Your procedure is scheduled on:  02/03/2016  Enter through the [facility] at 0900 AM.  .   Call this number if you have problems the morning of surgery: [phone]   Remember:   Do not eat food:After Midnight.  Do not drink clear liquids: After Midnight.  Take these medicines the morning of surgery with A SIP OF WATER: none   Do not wear jewelry, make-up or nail polish.  Do not wear lotions, powders, or perfumes. Do not wear deodorant.  Do not shave 48 hours prior to surgery.  Do not bring valuables to the hospital.  [facility] is not   responsible for any belongings or valuables brought to the hospital.  Contacts, dentures or bridgework may not be worn into surgery.  Leave suitcase in the car. After surgery it may be brought to your room.  For patients admitted to the hospital, checkout time is 11:00 AM the day of              discharge.   Patients discharged the day of surgery will not be allowed to drive             home.  Name and phone number of your driver: na  Special Instructions:   N/A   Please read over the following fact sheets that you were given:   Surgical Site Infection Prevention

## 2016-01-29 NOTE — Progress Notes (Signed)
Pt reports decreased FM today.  Very good FM during NST and pt was aware of all FM.

## 2016-01-29 NOTE — MAU Note (Signed)
PT  SAYS  [facility]    BAD  [facility].    [facility]     OTHER BABY  C/S -  DECREASE  FHR.      NO VE .

## 2016-01-29 NOTE — Patient Instructions (Signed)
Vaginal Birth After Cesarean Delivery Vaginal birth after cesarean delivery (VBAC) is giving birth vaginally after previously delivering a baby by a cesarean. In the past, if a woman had a cesarean delivery, all births afterward would be done by cesarean delivery. This is no longer true. It can be safe for the mother to try a vaginal delivery after having a cesarean delivery. It is important to discuss VBAC with your health care provider early in the pregnancy so you can understand the risks, benefits, and options. It will give you time to decide what is best in your particular case. The final decision about whether to have a VBAC or repeat cesarean delivery should be between you and your health care provider. Any changes in your health or your baby's health during your pregnancy may make it necessary to change your initial decision about VBAC. Women who plan to have a VBAC should check with their health care provider to be sure that:  The previous cesarean delivery was done with a low transverse uterine cut (incision) (not a vertical classical incision).  The birth canal is big enough for the baby.  There were no other operations on the uterus.  An electronic fetal monitor (EFM) will be on at all times during labor.  An operating room will be available and ready in case an emergency cesarean delivery is needed.  A health care provider and surgical nursing staff will be available at all times during labor to be ready to do an emergency delivery cesarean if necessary.  An anesthesiologist will be present in case an emergency cesarean delivery is needed.  The nursery is prepared and has adequate personnel and necessary equipment available to care for the baby in case of an emergency cesarean delivery. Benefits of VBAC  Shorter stay in the hospital.  Avoidance of risks associated with cesarean delivery, such as:  Surgical complications, such as opening of the incision or hernia in the  incision.  Injury to other organs.  Fever. This can occur if an infection develops after surgery. It can also occur as a reaction to the medicine given to make you numb during the surgery.  Less blood loss and need for blood transfusions.  Lower risk of blood clots and infection.  Shorter recovery.  Decreased risk for having to remove the uterus (hysterectomy).  Decreased risk for the placenta to completely or partially cover the opening of the uterus (placenta previa) with a future pregnancy.  Decrease risk in future labor and delivery. Risks of a VBAC  Tearing (rupture) of the uterus. This is occurs in less than 1% of VBACs. The risk of this happening is higher if:  Steps are taken to begin the labor process (induce labor) or stimulate or strengthen contractions (augment labor).  Medicine is used to soften (ripen) the cervix.  Having to remove the uterus (hysterectomy) if it ruptures. VBAC should not be done if:  The previous cesarean delivery was done with a vertical (classical) or T-shaped incision or you do not know what kind of incision was made.  You had a ruptured uterus.  You have had certain types of surgery on your uterus, such as removal of uterine fibroids. Ask your health care provider about other types of surgeries that prevent you from having a VBAC.  You have certain medical or childbirth (obstetrical) problems.  There are problems with the baby.  You have had two previous cesarean deliveries and no vaginal deliveries. Other facts to know about VBAC:  It   is safe to have an epidural anesthetic with VBAC.  It is safe to turn the baby from a breech position (attempt an external cephalic version).  It is safe to try a VBAC with twins.  VBAC may not be successful if your baby weights 8.8 lb (4 kg) or more. However, weight predictions are not always accurate and should not be used alone to decide if VBAC is right for you.  There is an increased failure rate  if the time between the cesarean delivery and VBAC is less than 19 months.  Your health care provider may advise against a VBAC if you have preeclampsia (high blood pressure, protein in the urine, and swelling of face and extremities).  VBAC is often successful if you previously gave birth vaginally.  VBAC is often successful when the labor starts spontaneously before the due date.  Delivering a baby through a VBAC is similar to having a normal spontaneous vaginal delivery. This information is not intended to replace advice given to you by your health care provider. Make sure you discuss any questions you have with your health care provider. Document Released: 08/24/2006 Document Revised: 08/09/2015 Document Reviewed: 09/30/2012 Elsevier Interactive Patient Education  2017 [employer]  

## 2016-01-29 NOTE — Progress Notes (Signed)
Edema to lower abdomen

## 2016-01-30 DIAGNOSIS — Z3A39 39 weeks gestation of pregnancy: Secondary | ICD-10-CM | POA: Diagnosis not present

## 2016-01-30 DIAGNOSIS — Z3493 Encounter for supervision of normal pregnancy, unspecified, third trimester: Secondary | ICD-10-CM | POA: Diagnosis present

## 2016-01-30 NOTE — MAU Note (Signed)
Plan of care discussed with patient. Patient is feeling less pain now than upon arrival and verbalizes understanding to come back if contractions intensify.

## 2016-01-30 NOTE — Discharge Instructions (Signed)
Third Trimester of Pregnancy °The third trimester is from week 29 through week 40 (months 7 through 9). The third trimester is a time when the unborn baby (fetus) is growing rapidly. At the end of the ninth month, the fetus is about 20 inches in length and weighs 6-10 pounds. °Body changes during your third trimester °Your body goes through many changes during pregnancy. The changes vary from woman to woman. During the third trimester: °· Your weight will continue to increase. You can expect to gain 25-35 pounds (11-16 kg) by the end of the pregnancy. °· You may begin to get stretch marks on your hips, abdomen, and breasts. °· You may urinate more often because the fetus is moving lower into your pelvis and pressing on your bladder. °· You may develop or continue to have heartburn. This is caused by increased hormones that slow down muscles in the digestive tract. °· You may develop or continue to have constipation because increased hormones slow digestion and cause the muscles that push waste through your intestines to relax. °· You may develop hemorrhoids. These are swollen veins (varicose veins) in the rectum that can itch or be painful. °· You may develop swollen, bulging veins (varicose veins) in your legs. °· You may have increased body aches in the pelvis, back, or thighs. This is due to weight gain and increased hormones that are relaxing your joints. °· You may have changes in your hair. These can include thickening of your hair, rapid growth, and changes in texture. Some women also have hair loss during or after pregnancy, or hair that feels dry or thin. Your hair will most likely return to normal after your baby is born. °· Your breasts will continue to grow and they will continue to become tender. A yellow fluid (colostrum) may leak from your breasts. This is the first milk you are producing for your baby. °· Your belly button may stick out. °· You may notice more swelling in your hands, face, or  ankles. °· You may have increased tingling or numbness in your hands, arms, and legs. The skin on your belly may also feel numb. °· You may feel short of breath because of your expanding uterus. °· You may have more problems sleeping. This can be caused by the size of your belly, increased need to urinate, and an increase in your body's metabolism. °· You may notice the fetus "dropping," or moving lower in your abdomen. °· You may have increased vaginal discharge. °· Your cervix becomes thin and soft (effaced) near your due date. °What to expect at prenatal visits °You will have prenatal exams every 2 weeks until week 36. Then you will have weekly prenatal exams. During a routine prenatal visit: °· You will be weighed to make sure you and the fetus are growing normally. °· Your blood pressure will be taken. °· Your abdomen will be measured to track your baby's growth. °· The fetal heartbeat will be listened to. °· Any test results from the previous visit will be discussed. °· You may have a cervical check near your due date to see if you have effaced. °At around 36 weeks, your health care provider will check your cervix. At the same time, your health care provider will also perform a test on the secretions of the vaginal tissue. This test is to determine if a type of bacteria, Group B streptococcus, is present. Your health care provider will explain this further. °Your health care provider may ask you: °·   What your birth plan is. °· How you are feeling. °· If you are feeling the baby move. °· If you have had any abnormal symptoms, such as leaking fluid, bleeding, severe headaches, or abdominal cramping. °· If you are using any tobacco products, including cigarettes, chewing tobacco, and electronic cigarettes. °· If you have any questions. °Other tests or screenings that may be performed during your third trimester include: °· Blood tests that check for low iron levels (anemia). °· Fetal testing to check the health,  activity level, and growth of the fetus. Testing is done if you have certain medical conditions or if there are problems during the pregnancy. °· Nonstress test (NST). This test checks the health of your baby to make sure there are no signs of problems, such as the baby not getting enough oxygen. During this test, a belt is placed around your belly. The baby is made to move, and its heart rate is monitored during movement. °What is false labor? °False labor is a condition in which you feel small, irregular tightenings of the muscles in the womb (contractions) that eventually go away. These are called Braxton Hicks contractions. Contractions may last for hours, days, or even weeks before true labor sets in. If contractions come at regular intervals, become more frequent, increase in intensity, or become painful, you should see your health care provider. °What are the signs of labor? °· Abdominal cramps. °· Regular contractions that start at 10 minutes apart and become stronger and more frequent with time. °· Contractions that start on the top of the uterus and spread down to the lower abdomen and back. °· Increased pelvic pressure and dull back pain. °· A watery or bloody mucus discharge that comes from the vagina. °· Leaking of amniotic fluid. This is also known as your "water breaking." It could be a slow trickle or a gush. Let your doctor know if it has a color or strange odor. °If you have any of these signs, call your health care provider right away, even if it is before your due date. °Follow these instructions at home: °Eating and drinking °· Continue to eat regular, healthy meals. °· Do not eat: °¨ Raw meat or meat spreads. °¨ Unpasteurized milk or cheese. °¨ Unpasteurized juice. °¨ Store-made salad. °¨ Refrigerated smoked seafood. °¨ Hot dogs or deli meat, unless they are piping hot. °¨ More than 6 ounces of albacore tuna a week. °¨ Shark, swordfish, king mackerel, or tile fish. °¨ Store-made salads. °¨ Raw  sprouts, such as mung bean or alfalfa sprouts. °· Take prenatal vitamins as told by your health care provider. °· Take 1000 mg of calcium daily as told by your health care provider. °· If you develop constipation: °¨ Take over-the-counter or prescription medicines. °¨ Drink enough fluid to keep your urine clear or pale yellow. °¨ Eat foods that are high in fiber, such as fresh fruits and vegetables, whole grains, and beans. °¨ Limit foods that are high in fat and processed sugars, such as fried and sweet foods. °Activity °· Exercise only as directed by your health care provider. Healthy pregnant women should aim for 2 hours and 30 minutes of moderate exercise per week. If you experience any pain or discomfort while exercising, stop. °· Avoid heavy lifting. °· Do not exercise in extreme heat or humidity, or at high altitudes. °· Wear low-heel, comfortable shoes. °· Practice good posture. °· Do not travel far distances unless it is absolutely necessary and only with the approval   of your health care provider. °· Wear your seat belt at all times while in a car, on a bus, or on a plane. °· Take frequent breaks and rest with your legs elevated if you have leg cramps or low back pain. °· Do not use hot tubs, steam rooms, or saunas. °· You may continue to have sex unless your health care provider tells you otherwise. °Lifestyle °· Do not use any products that contain nicotine or tobacco, such as cigarettes and e-cigarettes. If you need help quitting, ask your health care provider. °· Do not drink alcohol. °· Do not use any medicinal herbs or unprescribed drugs. These chemicals affect the formation and growth of the baby. °· If you develop varicose veins: °¨ Wear support pantyhose or compression stockings as told by your healthcare provider. °¨ Elevate your feet for 15 minutes, 3-4 times a day. °· Wear a supportive maternity bra to help with breast tenderness. °General instructions °· Take over-the-counter and prescription  medicines only as told by your health care provider. There are medicines that are either safe or unsafe to take during pregnancy. °· Take warm sitz baths to soothe any pain or discomfort caused by hemorrhoids. Use hemorrhoid cream or witch hazel if your health care provider approves. °· Avoid cat litter boxes and soil used by cats. These carry germs that can cause birth defects in the baby. If you have a cat, ask someone to clean the litter box for you. °· To prepare for the arrival of your baby: °¨ Take prenatal classes to understand, practice, and ask questions about the labor and delivery. °¨ Make a trial run to the hospital. °¨ Visit the hospital and tour the maternity area. °¨ Arrange for maternity or paternity leave through employers. °¨ Arrange for family and friends to take care of pets while you are in the hospital. °¨ Purchase a rear-facing car seat and make sure you know how to install it in your car. °¨ Pack your hospital bag. °¨ Prepare the baby’s nursery. Make sure to remove all pillows and stuffed animals from the baby's crib to prevent suffocation. °· Visit your dentist if you have not gone during your pregnancy. Use a soft toothbrush to brush your teeth and be gentle when you floss. °· Keep all prenatal follow-up visits as told by your health care provider. This is important. °Contact a health care provider if: °· You are unsure if you are in labor or if your water has broken. °· You become dizzy. °· You have mild pelvic cramps, pelvic pressure, or nagging pain in your abdominal area. °· You have lower back pain. °· You have persistent nausea, vomiting, or diarrhea. °· You have an unusual or bad smelling vaginal discharge. °· You have pain when you urinate. °Get help right away if: °· You have a fever. °· You are leaking fluid from your vagina. °· You have spotting or bleeding from your vagina. °· You have severe abdominal pain or cramping. °· You have rapid weight loss or weight gain. °· You have  shortness of breath with chest pain. °· You notice sudden or extreme swelling of your face, hands, ankles, feet, or legs. °· Your baby makes fewer than 10 movements in 2 hours. °· You have severe headaches that do not go away with medicine. °· You have vision changes. °Summary °· The third trimester is from week 29 through week 40, months 7 through 9. The third trimester is a time when the unborn baby (fetus)   is growing rapidly. °· During the third trimester, your discomfort may increase as you and your baby continue to gain weight. You may have abdominal, leg, and back pain, sleeping problems, and an increased need to urinate. °· During the third trimester your breasts will keep growing and they will continue to become tender. A yellow fluid (colostrum) may leak from your breasts. This is the first milk you are producing for your baby. °· False labor is a condition in which you feel small, irregular tightenings of the muscles in the womb (contractions) that eventually go away. These are called Braxton Hicks contractions. Contractions may last for hours, days, or even weeks before true labor sets in. °· Signs of labor can include: abdominal cramps; regular contractions that start at 10 minutes apart and become stronger and more frequent with time; watery or bloody mucus discharge that comes from the vagina; increased pelvic pressure and dull back pain; and leaking of amniotic fluid. °This information is not intended to replace advice given to you by your health care provider. Make sure you discuss any questions you have with your health care provider. °Document Released: 02/25/2001 Document Revised: 08/09/2015 Document Reviewed: 05/04/2012 °Elsevier Interactive Patient Education © 2017 [employer] °Introduction °Patient Name: ________________________________________________ Patient Due Date: ____________________ °What is a fetal movement count? °A fetal movement count is the number of times that you feel your baby  move during a certain amount of time. This may also be called a fetal kick count. A fetal movement count is recommended for every pregnant woman. You may be asked to start counting fetal movements as early as week 28 of your pregnancy. °Pay attention to when your baby is most active. You may notice your baby's sleep and wake cycles. You may also notice things that make your baby move more. You should do a fetal movement count: °· When your baby is normally most active. °· At the same time each day. °A good time to count movements is while you are resting, after having something to eat and drink. °How do I count fetal movements? °1. Find a quiet, comfortable area. Sit, or lie down on your side. °2. Write down the date, the start time and stop time, and the number of movements that you felt between those two times. Take this information with you to your health care visits. °3. For 2 hours, count kicks, flutters, swishes, rolls, and jabs. You should feel at least 10 movements during 2 hours. °4. You may stop counting after you have felt 10 movements. °5. If you do not feel 10 movements in 2 hours, have something to eat and drink. Then, keep resting and counting for 1 hour. If you feel at least 4 movements during that hour, you may stop counting. °Contact a health care provider if: °· You feel fewer than 4 movements in 2 hours. °· Your baby is not moving like he or she usually does. °Date: ____________ Start time: ____________ Stop time: ____________ Movements: ____________ °Date: ____________ Start time: ____________ Stop time: ____________ Movements: ____________ °Date: ____________ Start time: ____________ Stop time: ____________ Movements: ____________ °Date: ____________ Start time: ____________ Stop time: ____________ Movements: ____________ °Date: ____________ Start time: ____________ Stop time: ____________ Movements: ____________ °Date: ____________ Start time: ____________ Stop time: ____________ Movements:  ____________ °Date: ____________ Start time: ____________ Stop time: ____________ Movements: ____________ °Date: ____________ Start time: ____________ Stop time: ____________ Movements: ____________ °Date: ____________ Start time: ____________ Stop time: ____________ Movements: ____________ °This information is not intended to replace   advice given to you by your health care provider. Make sure you discuss any questions you have with your health care provider. °Document Released: 04/02/2006 Document Revised: 10/31/2015 Document Reviewed: 04/12/2015 °Elsevier Interactive Patient Education © 2017 [employer] °Braxton Hicks Contractions °Contractions of the uterus can occur throughout pregnancy. Contractions are not always a sign that you are in labor.  °WHAT ARE BRAXTON HICKS CONTRACTIONS?  °Contractions that occur before labor are called Braxton Hicks contractions, or false labor. Toward the end of pregnancy (32-34 weeks), these contractions can develop more often and may become more forceful. This is not true labor because these contractions do not result in opening (dilatation) and thinning of the cervix. They are sometimes difficult to tell apart from true labor because these contractions can be forceful and people have different pain tolerances. You should not feel embarrassed if you go to the hospital with false labor. Sometimes, the only way to tell if you are in true labor is for your health care provider to look for changes in the cervix. °If there are no prenatal problems or other health problems associated with the pregnancy, it is completely safe to be sent home with false labor and await the onset of true labor. °HOW CAN YOU TELL THE DIFFERENCE BETWEEN TRUE AND FALSE LABOR? °False Labor  °· The contractions of false labor are usually shorter and not as hard as those of true labor.   °· The contractions are usually irregular.   °· The contractions are often felt in the front of the lower abdomen and in  the groin.   °· The contractions may go away when you walk around or change positions while lying down.   °· The contractions get weaker and are shorter lasting as time goes on.   °· The contractions do not usually become progressively stronger, regular, and closer together as with true labor.   °True Labor  °· Contractions in true labor last 30-70 seconds, become very regular, usually become more intense, and increase in frequency.   °· The contractions do not go away with walking.   °· The discomfort is usually felt in the top of the uterus and spreads to the lower abdomen and low back.   °· True labor can be determined by your health care provider with an exam. This will show that the cervix is dilating and getting thinner.   °WHAT TO REMEMBER °· Keep up with your usual exercises and follow other instructions given by your health care provider.   °· Take medicines as directed by your health care provider.   °· Keep your regular prenatal appointments.   °· Eat and drink lightly if you think you are going into labor.   °· If Braxton Hicks contractions are making you uncomfortable:   °¨ Change your position from lying down or resting to walking, or from walking to resting.   °¨ Sit and rest in a tub of warm water.   °¨ Drink 2-3 glasses of water. Dehydration may cause these contractions.   °¨ Do slow and deep breathing several times an hour.   °WHEN SHOULD I SEEK IMMEDIATE MEDICAL CARE? °Seek immediate medical care if: °· Your contractions become stronger, more regular, and closer together.   °· You have fluid leaking or gushing from your vagina.   °· You have a fever.   °· You pass blood-tinged mucus.   °· You have vaginal bleeding.   °· You have continuous abdominal pain.   °· You have low back pain that you never had before.   °· You feel your baby's head pushing down and causing pelvic pressure.   °· Your   baby is not moving as much as it used to.   °This information is not intended to replace advice given to you  by your health care provider. Make sure you discuss any questions you have with your health care provider. °Document Released: 03/03/2005 Document Revised: 06/25/2015 Document Reviewed: 12/13/2012 °Elsevier Interactive Patient Education © 2017 [employer] ° °

## 2016-01-30 NOTE — Progress Notes (Signed)
Notified of pt arrival in [facility]U, exam, contraction pattern and unchanged exam after one hour. Strip reviewed with MD. Will discuss plan of care with patient and ok to discharge patient home if patient ok with plan.

## 2016-02-01 ENCOUNTER — Encounter (HOSPITAL_COMMUNITY): Admission: RE | Admit: 2016-02-01 | Payer: Medicaid Other | Source: Ambulatory Visit

## 2016-02-02 NOTE — H&P (Signed)
Nicholaus CorollaDashay Borowski is a 25 y.o. female presenting for repeat cesarean section at 39 weeks.  No complaints today.   OB History    Gravida Para Term Preterm AB Living   2 1 1  0 0 1   SAB TAB Ectopic Multiple Live Births   0 0 0 0 1     Past Medical History:  Diagnosis Date  . Gestational diabetes    With first pregnancy   Past Surgical History:  Procedure Laterality Date  . CESAREAN SECTION     Family History: family history is not on file. Social History:  reports that she has been smoking.  She has never used smokeless tobacco. She reports that she uses drugs, including Marijuana. She reports that she does not drink alcohol.     Maternal Diabetes: No Genetic Screening: Declined Maternal Ultrasounds/Referrals: Normal with limited vewis Fetal Ultrasounds or other Referrals:  None Maternal Substance Abuse:  No Significant Maternal Medications:  None Significant Maternal Lab Results:  Lab values include: Other: + Gonorrhea in pregnancy, TOC negative Other Comments:  None  ROS History   Review of Systems - General ROS: negative Respiratory ROS: no cough, shortness of breath, or wheezing Cardiovascular ROS: no chest pain or dyspnea on exertion Gastrointestinal ROS: no abdominal pain, change in bowel habits, or black or bloody stools Genito-Urinary ROS: no dysuria, trouble voiding, or hematuria Neurological ROS: no TIA or stroke symptoms  Exam Physical Exam  Vitals:   02/03/16 0955 02/03/16 1018 02/03/16 1036  BP: (!) 110/59  106/67  Pulse: (!) 109  (!) 112  Resp: 20 18 18   Temp: 98.2 F (36.8 C)    TempSrc: Oral     Vitals:  WNL General appearance: alert, cooperative and no distress  HEENT: Normocephalic, without obvious abnormality, atraumatic Eyes: negative Throat: lips, mucosa, and tongue normal;  Respiratory: Clear to auscultation bilaterally  CV: Regular rate and rhythm  Breasts:  Normal appearance, no masses or tenderness,   GI: Soft, non-tender; bowel sounds  normal; no masses,  no organomegaly, gravid  GU: External Genitalia:  Tanner V, no lesion  Vagina: Pink, normal rugae, no blood or discharge  Cervix: Not evaluated  Uterus:  Gravid 40 weeks  Adnexa: Not evaluated  Musculoskeletal: No edema, redness or tenderness in the calves or thighs  Skin: No lesions or rash  Lymphatic: Axillary adenopathy: none     Psychiatric: Normal mood and behavior   CBC    Component Value Date/Time   WBC 12.5 (H) 02/03/2016 0940   RBC 4.55 02/03/2016 0940   HGB 8.4 (L) 02/03/2016 0940   HCT 25.5 (L) 02/03/2016 0940   PLT 481 (H) 02/03/2016 0940   MCV 56.0 (L) 02/03/2016 0940   MCH 18.5 (L) 02/03/2016 0940   MCHC 32.9 02/03/2016 0940   RDW 20.5 (H) 02/03/2016 0940   LYMPHSABS 3,549 07/31/2015 1449   MONOABS 1,014 (H) 07/31/2015 1449   EOSABS 169 07/31/2015 1449   BASOSABS 0 07/31/2015 1449     Prenatal labs: ABO, Rh: O/POS/-- (05/16 1449) Antibody: NEG (05/16 1449) Rubella: 3.30 (05/16 1449) RPR: NON REAC (09/12 0001)  HBsAg: NEGATIVE (05/16 1449)  HIV: NONREACTIVE (09/12 0001)  GBS:   negative  Assessment/Plan: 25 yo G2P1001 at 39 weeks (c/w 6 weeks US) presents for scheduled elective repeat cesarean section.  Pt feels well today.  The risks of cesarean section discussed with the patient included but were not limited to: bleeding which may require transfusion or reoperation; infection which may require  antibiotics; injury to bowel, bladder, ureters or other surrounding organs; injury to the fetus; need for additional procedures including hysterectomy in the event of a life-threatening hemorrhage; placental abnormalities wth subsequent pregnancies, incisional problems, thromboembolic phenomenon and other postoperative/anesthesia complications. The patient concurred with the proposed plan, giving informed written consent for the procedure.    Pt has blood typed and cross due to anemia.  Pt has Hemoglobin C train Pt will need device for pannus  retraction and PICO wound vac to help prevent wound infection.  Crystall Donaldson H. 02/02/2016, 4:18 PM

## 2016-02-03 ENCOUNTER — Encounter (HOSPITAL_COMMUNITY): Payer: Self-pay | Admitting: *Deleted

## 2016-02-03 ENCOUNTER — Inpatient Hospital Stay (HOSPITAL_COMMUNITY): Payer: Medicaid Other | Admitting: Anesthesiology

## 2016-02-03 ENCOUNTER — Encounter (HOSPITAL_COMMUNITY)
Admission: RE | Disposition: A | Discharge status: Home / Self Care | Payer: Self-pay | Source: Ambulatory Visit | Attending: Obstetrics & Gynecology

## 2016-02-03 ENCOUNTER — Inpatient Hospital Stay (HOSPITAL_COMMUNITY)
Admission: RE | Admit: 2016-02-03 | Discharge: 2016-02-05 | DRG: 765 | Disposition: A | Discharge status: Home / Self Care | Payer: Medicaid Other | Source: Ambulatory Visit | Attending: Obstetrics & Gynecology | Admitting: Obstetrics & Gynecology

## 2016-02-03 DIAGNOSIS — Z3A39 39 weeks gestation of pregnancy: Secondary | ICD-10-CM

## 2016-02-03 DIAGNOSIS — O99214 Obesity complicating childbirth: Secondary | ICD-10-CM | POA: Diagnosis present

## 2016-02-03 DIAGNOSIS — A5901 Trichomonal vulvovaginitis: Secondary | ICD-10-CM

## 2016-02-03 DIAGNOSIS — O34219 Maternal care for unspecified type scar from previous cesarean delivery: Secondary | ICD-10-CM

## 2016-02-03 DIAGNOSIS — D649 Anemia, unspecified: Secondary | ICD-10-CM | POA: Diagnosis present

## 2016-02-03 DIAGNOSIS — O34211 Maternal care for low transverse scar from previous cesarean delivery: Secondary | ICD-10-CM | POA: Diagnosis present

## 2016-02-03 DIAGNOSIS — O9902 Anemia complicating childbirth: Secondary | ICD-10-CM | POA: Diagnosis present

## 2016-02-03 DIAGNOSIS — Z6841 Body Mass Index (BMI) 40.0 and over, adult: Secondary | ICD-10-CM | POA: Diagnosis not present

## 2016-02-03 DIAGNOSIS — O23591 Infection of other part of genital tract in pregnancy, first trimester: Secondary | ICD-10-CM

## 2016-02-03 DIAGNOSIS — O98213 Gonorrhea complicating pregnancy, third trimester: Secondary | ICD-10-CM

## 2016-02-03 DIAGNOSIS — O99334 Smoking (tobacco) complicating childbirth: Secondary | ICD-10-CM | POA: Diagnosis present

## 2016-02-03 DIAGNOSIS — F17200 Nicotine dependence, unspecified, uncomplicated: Secondary | ICD-10-CM | POA: Diagnosis present

## 2016-02-03 DIAGNOSIS — Z3480 Encounter for supervision of other normal pregnancy, unspecified trimester: Secondary | ICD-10-CM

## 2016-02-03 LAB — CBC
HCT: 26.1 % — ABNORMAL LOW (ref 36.0–46.0)
HEMATOCRIT: 25.5 % — AB (ref 36.0–46.0)
HEMOGLOBIN: 8.4 g/dL — AB (ref 12.0–15.0)
Hemoglobin: 8.5 g/dL — ABNORMAL LOW (ref 12.0–15.0)
MCH: 18.5 pg — ABNORMAL LOW (ref 26.0–34.0)
MCH: 18.4 pg — ABNORMAL LOW (ref 26.0–34.0)
MCHC: 32.9 g/dL (ref 30.0–36.0)
MCHC: 32.6 g/dL (ref 30.0–36.0)
MCV: 56 fL — AB (ref 78.0–100.0)
MCV: 56.5 fL — ABNORMAL LOW (ref 78.0–100.0)
PLATELETS: 417 10*3/uL — AB (ref 150–400)
Platelets: 481 10*3/uL — ABNORMAL HIGH (ref 150–400)
RBC: 4.55 MIL/uL (ref 3.87–5.11)
RBC: 4.62 MIL/uL (ref 3.87–5.11)
RDW: 20.5 % — AB (ref 11.5–15.5)
RDW: 20.6 % — AB (ref 11.5–15.5)
WBC: 12.5 10*3/uL — ABNORMAL HIGH (ref 4.0–10.5)
WBC: 19.4 10*3/uL — AB (ref 4.0–10.5)

## 2016-02-03 LAB — PREPARE RBC (CROSSMATCH)

## 2016-02-03 LAB — RPR: RPR: NONREACTIVE

## 2016-02-03 LAB — ABO/RH: ABO/RH(D): O POS

## 2016-02-03 SURGERY — Surgical Case
Anesthesia: Regional | Site: Abdomen

## 2016-02-03 MED ORDER — PHENYLEPHRINE 40 MCG/ML (10ML) SYRINGE FOR IV PUSH (FOR BLOOD PRESSURE SUPPORT)
PREFILLED_SYRINGE | INTRAVENOUS | Status: AC
  Filled 2016-02-03: qty 10

## 2016-02-03 MED ORDER — PHENYLEPHRINE 8 MG IN D5W 100 ML (0.08MG/ML) PREMIX OPTIME
INJECTION | INTRAVENOUS | Status: AC
  Filled 2016-02-03: qty 100

## 2016-02-03 MED ORDER — MORPHINE SULFATE-NACL 0.5-0.9 MG/ML-% IV SOSY
PREFILLED_SYRINGE | INTRAVENOUS | Status: AC
  Filled 2016-02-03: qty 1

## 2016-02-03 MED ORDER — OXYTOCIN 40 UNITS IN LACTATED RINGERS INFUSION - SIMPLE MED
INTRAVENOUS | Status: DC | PRN
  Administered 2016-02-03: 40 [IU] via INTRAVENOUS

## 2016-02-03 MED ORDER — SIMETHICONE 80 MG PO CHEW
80.0000 mg | CHEWABLE_TABLET | ORAL | Status: DC
  Administered 2016-02-04 (×2): 80 mg via ORAL
  Filled 2016-02-03 (×2): qty 1

## 2016-02-03 MED ORDER — BUPIVACAINE IN DEXTROSE 0.75-8.25 % IT SOLN
INTRATHECAL | Status: DC | PRN
  Administered 2016-02-03: 1.6 mL via INTRATHECAL

## 2016-02-03 MED ORDER — KETOROLAC TROMETHAMINE 30 MG/ML IJ SOLN
INTRAMUSCULAR | Status: AC
  Filled 2016-02-03: qty 1

## 2016-02-03 MED ORDER — SCOPOLAMINE 1 MG/3DAYS TD PT72: 1.0000 | MEDICATED_PATCH | Freq: Once | TRANSDERMAL | Status: DC

## 2016-02-03 MED ORDER — MENTHOL 3 MG MT LOZG: 1.0000 | LOZENGE | OROMUCOSAL | Status: DC | PRN

## 2016-02-03 MED ORDER — METOCLOPRAMIDE HCL 5 MG/ML IJ SOLN
INTRAMUSCULAR | Status: AC
  Filled 2016-02-03: qty 2

## 2016-02-03 MED ORDER — NALOXONE HCL 0.4 MG/ML IJ SOLN: 0.4000 mg | INTRAMUSCULAR | Status: DC | PRN

## 2016-02-03 MED ORDER — SCOPOLAMINE 1 MG/3DAYS TD PT72
MEDICATED_PATCH | TRANSDERMAL | Status: DC | PRN
  Administered 2016-02-03: 1 via TRANSDERMAL

## 2016-02-03 MED ORDER — SCOPOLAMINE 1 MG/3DAYS TD PT72
MEDICATED_PATCH | TRANSDERMAL | Status: AC
  Filled 2016-02-03: qty 1

## 2016-02-03 MED ORDER — ONDANSETRON HCL 4 MG/2ML IJ SOLN
4.0000 mg | Freq: Four times a day (QID) | INTRAMUSCULAR | Status: DC | PRN
  Administered 2016-02-03: 4 mg via INTRAVENOUS
  Filled 2016-02-03: qty 2

## 2016-02-03 MED ORDER — ONDANSETRON HCL 4 MG/2ML IJ SOLN
INTRAMUSCULAR | Status: DC | PRN
Start: 2016-02-03 — End: 2016-02-03
  Administered 2016-02-03: 4 mg via INTRAVENOUS

## 2016-02-03 MED ORDER — METOCLOPRAMIDE HCL 5 MG/ML IJ SOLN
INTRAMUSCULAR | Status: DC | PRN
  Administered 2016-02-03: 10 mg via INTRAVENOUS

## 2016-02-03 MED ORDER — FENTANYL CITRATE (PF) 100 MCG/2ML IJ SOLN
INTRAMUSCULAR | Status: DC | PRN
  Administered 2016-02-03: 20 ug via INTRATHECAL

## 2016-02-03 MED ORDER — OXYTOCIN 40 UNITS IN LACTATED RINGERS INFUSION - SIMPLE MED: 2.5000 [IU]/h | INTRAVENOUS | Status: AC

## 2016-02-03 MED ORDER — SENNOSIDES-DOCUSATE SODIUM 8.6-50 MG PO TABS
2.0000 | ORAL_TABLET | ORAL | Status: DC
  Administered 2016-02-04 (×2): 2 via ORAL
  Filled 2016-02-03 (×2): qty 2

## 2016-02-03 MED ORDER — KETOROLAC TROMETHAMINE 30 MG/ML IJ SOLN: 30.0000 mg | Freq: Four times a day (QID) | INTRAMUSCULAR | Status: DC | PRN

## 2016-02-03 MED ORDER — TETANUS-DIPHTH-ACELL PERTUSSIS 5-2.5-18.5 LF-MCG/0.5 IM SUSP: 0.5000 mL | Freq: Once | INTRAMUSCULAR | Status: DC

## 2016-02-03 MED ORDER — SODIUM CHLORIDE 0.9% FLUSH: 3.0000 mL | INTRAVENOUS | Status: DC | PRN

## 2016-02-03 MED ORDER — ONDANSETRON HCL 4 MG/2ML IJ SOLN
INTRAMUSCULAR | Status: AC
  Filled 2016-02-03: qty 2

## 2016-02-03 MED ORDER — PHENYLEPHRINE HCL 10 MG/ML IJ SOLN
INTRAMUSCULAR | Status: DC | PRN
  Administered 2016-02-03: 40 ug via INTRAVENOUS
  Administered 2016-02-03 (×2): 80 ug via INTRAVENOUS

## 2016-02-03 MED ORDER — NALBUPHINE HCL 10 MG/ML IJ SOLN: 5.0000 mg | INTRAMUSCULAR | Status: DC | PRN

## 2016-02-03 MED ORDER — MORPHINE SULFATE (PF) 0.5 MG/ML IJ SOLN
INTRAMUSCULAR | Status: DC | PRN
  Administered 2016-02-03: .2 mg via EPIDURAL

## 2016-02-03 MED ORDER — ACETAMINOPHEN 325 MG PO TABS
650.0000 mg | ORAL_TABLET | ORAL | Status: DC | PRN
  Administered 2016-02-04 – 2016-02-05 (×2): 650 mg via ORAL
  Filled 2016-02-03 (×2): qty 2

## 2016-02-03 MED ORDER — SODIUM CHLORIDE 0.9 % IR SOLN
Status: DC | PRN
  Administered 2016-02-03: 1000 mL

## 2016-02-03 MED ORDER — ZOLPIDEM TARTRATE 5 MG PO TABS: 5.0000 mg | ORAL_TABLET | Freq: Every evening | ORAL | Status: DC | PRN

## 2016-02-03 MED ORDER — NALBUPHINE HCL 10 MG/ML IJ SOLN: 5.0000 mg | Freq: Once | INTRAMUSCULAR | Status: DC | PRN

## 2016-02-03 MED ORDER — LACTATED RINGERS IV SOLN
INTRAVENOUS | Status: DC | PRN
  Administered 2016-02-03: 13:00:00 via INTRAVENOUS

## 2016-02-03 MED ORDER — SODIUM CHLORIDE 0.9 % IV SOLN: Freq: Once | INTRAVENOUS | Status: DC

## 2016-02-03 MED ORDER — KETOROLAC TROMETHAMINE 30 MG/ML IJ SOLN: 30.0000 mg | Freq: Four times a day (QID) | INTRAMUSCULAR | Status: DC

## 2016-02-03 MED ORDER — DIPHENHYDRAMINE HCL 25 MG PO CAPS
25.0000 mg | ORAL_CAPSULE | Freq: Four times a day (QID) | ORAL | Status: DC | PRN
  Administered 2016-02-04: 25 mg via ORAL
  Filled 2016-02-03: qty 1

## 2016-02-03 MED ORDER — PRENATAL MULTIVITAMIN CH
1.0000 | ORAL_TABLET | Freq: Every day | ORAL | Status: DC
  Administered 2016-02-04 – 2016-02-05 (×2): 1 via ORAL
  Filled 2016-02-03 (×2): qty 1

## 2016-02-03 MED ORDER — PHENYLEPHRINE 8 MG IN D5W 100 ML (0.08MG/ML) PREMIX OPTIME
INJECTION | INTRAVENOUS | Status: DC | PRN
  Administered 2016-02-03: 60 ug/min via INTRAVENOUS

## 2016-02-03 MED ORDER — DIPHENHYDRAMINE HCL 25 MG PO CAPS
25.0000 mg | ORAL_CAPSULE | ORAL | Status: DC | PRN
  Filled 2016-02-03: qty 1

## 2016-02-03 MED ORDER — CEFAZOLIN SODIUM-DEXTROSE 2-4 GM/100ML-% IV SOLN
2.0000 g | INTRAVENOUS | Status: AC
  Administered 2016-02-03: 2 g via INTRAVENOUS
  Filled 2016-02-03: qty 100

## 2016-02-03 MED ORDER — FENTANYL CITRATE (PF) 100 MCG/2ML IJ SOLN: 25.0000 ug | INTRAMUSCULAR | Status: DC | PRN

## 2016-02-03 MED ORDER — SODIUM CHLORIDE 0.9 % IV BOLUS (SEPSIS)
500.0000 mL | Freq: Once | INTRAVENOUS | Status: AC
  Administered 2016-02-03: 500 mL via INTRAVENOUS

## 2016-02-03 MED ORDER — LACTATED RINGERS IV SOLN: INTRAVENOUS | Status: DC

## 2016-02-03 MED ORDER — FENTANYL CITRATE (PF) 100 MCG/2ML IJ SOLN
INTRAMUSCULAR | Status: AC
  Filled 2016-02-03: qty 2

## 2016-02-03 MED ORDER — ERYTHROMYCIN 5 MG/GM OP OINT
TOPICAL_OINTMENT | OPHTHALMIC | Status: AC
  Filled 2016-02-03: qty 1

## 2016-02-03 MED ORDER — MEASLES, MUMPS & RUBELLA VAC ~~LOC~~ INJ
0.5000 mL | INJECTION | Freq: Once | SUBCUTANEOUS | Status: DC
  Filled 2016-02-03: qty 0.5

## 2016-02-03 MED ORDER — WITCH HAZEL-GLYCERIN EX PADS: 1.0000 "application " | MEDICATED_PAD | CUTANEOUS | Status: DC | PRN

## 2016-02-03 MED ORDER — DIBUCAINE 1 % RE OINT: 1.0000 "application " | TOPICAL_OINTMENT | RECTAL | Status: DC | PRN

## 2016-02-03 MED ORDER — DIPHENHYDRAMINE HCL 50 MG/ML IJ SOLN: 12.5000 mg | INTRAMUSCULAR | Status: DC | PRN

## 2016-02-03 MED ORDER — KETOROLAC TROMETHAMINE 30 MG/ML IJ SOLN
30.0000 mg | Freq: Four times a day (QID) | INTRAMUSCULAR | Status: DC | PRN
  Administered 2016-02-03: 30 mg via INTRAMUSCULAR

## 2016-02-03 MED ORDER — LACTATED RINGERS IV SOLN
INTRAVENOUS | Status: DC
  Administered 2016-02-03 (×2): via INTRAVENOUS

## 2016-02-03 MED ORDER — OXYTOCIN 10 UNIT/ML IJ SOLN
INTRAMUSCULAR | Status: AC
  Filled 2016-02-03: qty 4

## 2016-02-03 MED ORDER — COCONUT OIL OIL: 1.0000 "application " | TOPICAL_OIL | Status: DC | PRN

## 2016-02-03 MED ORDER — ONDANSETRON HCL 4 MG/2ML IJ SOLN: 4.0000 mg | Freq: Three times a day (TID) | INTRAMUSCULAR | Status: DC | PRN

## 2016-02-03 MED ORDER — IBUPROFEN 600 MG PO TABS
600.0000 mg | ORAL_TABLET | Freq: Four times a day (QID) | ORAL | Status: DC
  Administered 2016-02-04 – 2016-02-05 (×7): 600 mg via ORAL
  Filled 2016-02-03 (×7): qty 1

## 2016-02-03 MED ORDER — SIMETHICONE 80 MG PO CHEW
80.0000 mg | CHEWABLE_TABLET | ORAL | Status: DC | PRN
  Administered 2016-02-04 – 2016-02-05 (×2): 80 mg via ORAL
  Filled 2016-02-03 (×3): qty 1

## 2016-02-03 MED ORDER — SOD CITRATE-CITRIC ACID 500-334 MG/5ML PO SOLN
30.0000 mL | Freq: Once | ORAL | Status: AC
  Administered 2016-02-03: 30 mL via ORAL
  Filled 2016-02-03: qty 15

## 2016-02-03 MED ORDER — MEPERIDINE HCL 25 MG/ML IJ SOLN: 6.2500 mg | INTRAMUSCULAR | Status: DC | PRN

## 2016-02-03 MED ORDER — NALOXONE HCL 2 MG/2ML IJ SOSY
1.0000 ug/kg/h | PREFILLED_SYRINGE | INTRAVENOUS | Status: DC | PRN
  Filled 2016-02-03: qty 2

## 2016-02-03 SURGICAL SUPPLY — 29 items
CHLORAPREP W/TINT 26ML (MISCELLANEOUS) ×3 IMPLANT
CLAMP CORD UMBIL (MISCELLANEOUS) IMPLANT
CLOTH BEACON ORANGE TIMEOUT ST (SAFETY) ×3 IMPLANT
DRAIN JACKSON PRT FLT 7MM (DRAIN) IMPLANT
DRESSING DISP NPWT PICO 4X12 (MISCELLANEOUS) ×3 IMPLANT
DRSG OPSITE POSTOP 4X10 (GAUZE/BANDAGES/DRESSINGS) ×3 IMPLANT
ELECT REM PT RETURN 9FT ADLT (ELECTROSURGICAL) ×6
ELECTRODE REM PT RTRN 9FT ADLT (ELECTROSURGICAL) ×2 IMPLANT
EVACUATOR SILICONE 100CC (DRAIN) IMPLANT
EXTRACTOR VACUUM M CUP 4 TUBE (SUCTIONS) IMPLANT
EXTRACTOR VACUUM M CUP 4' TUBE (SUCTIONS)
GLOVE BIO SURGEON STRL SZ7 (GLOVE) ×3 IMPLANT
GLOVE BIOGEL PI IND STRL 7.0 (GLOVE) ×2 IMPLANT
GLOVE BIOGEL PI INDICATOR 7.0 (GLOVE) ×4
GOWN STRL REUS W/TWL LRG LVL3 (GOWN DISPOSABLE) ×6 IMPLANT
KIT ABG SYR 3ML LUER SLIP (SYRINGE) IMPLANT
NEEDLE HYPO 25X5/8 SAFETYGLIDE (NEEDLE) ×3 IMPLANT
NS IRRIG 1000ML POUR BTL (IV SOLUTION) ×3 IMPLANT
PACK C SECTION WH (CUSTOM PROCEDURE TRAY) ×3 IMPLANT
PAD OB MATERNITY 4.3X12.25 (PERSONAL CARE ITEMS) ×3 IMPLANT
PENCIL SMOKE EVAC W/HOLSTER (ELECTROSURGICAL) ×3 IMPLANT
RTRCTR C-SECT PINK 25CM LRG (MISCELLANEOUS) ×3 IMPLANT
SUT MON AB 2-0 CT1 27 (SUTURE) ×6 IMPLANT
SUT PLAIN 2 0 XLH (SUTURE) ×3 IMPLANT
SUT VIC AB 0 CTX 36 (SUTURE) ×10
SUT VIC AB 0 CTX36XBRD ANBCTRL (SUTURE) ×5 IMPLANT
SUT VIC AB 4-0 KS 27 (SUTURE) ×6 IMPLANT
TOWEL OR 17X24 6PK STRL BLUE (TOWEL DISPOSABLE) ×3 IMPLANT
TRAY FOLEY CATH SILVER 14FR (SET/KITS/TRAYS/PACK) ×3 IMPLANT

## 2016-02-03 NOTE — Consult Note (Signed)
[facility]  Delivery Note:  C-section       02/03/2016  1:12 PM  I was called to the operating room at the request of the patient's obstetrician (Dr. Legette) for a repeat c-section.  PRENATAL HX:  This is a 24 y/o G2P1001 at 39 and 0/[redacted] weeks gestation who presents for a repeat c-section.  Her pregnancy has been complicated by gonorrhea that was treated.    INTRAPARTUM HX:   Repeat c-section with AROM at delivery  DELIVERY:  Infant was vigorous at delivery, requiring no resuscitation other than standard warming, drying and stimulation.  APGARs 9 and 9.  Exam within normal limits.  After 5 minutes, baby left with nurse to assist parents with skin-to-skin care.   _____________________ Electronically Signed By: Brandis Matsuura, MD Neonatologist  

## 2016-02-03 NOTE — Anesthesia Procedure Notes (Signed)
Spinal Patient location during procedure: OB Staffing Anesthesiologist: Kaimani Clayson Preanesthetic Checklist Completed: patient identified, surgical consent, pre-op evaluation, timeout performed, IV checked, risks and benefits discussed and monitors and equipment checked Spinal Block Patient position: sitting Prep: site prepped and draped and DuraPrep Patient monitoring: heart rate, cardiac monitor, continuous pulse ox and blood pressure Approach: midline Location: L3-4 Injection technique: single-shot Needle Needle type: Pencan  Needle gauge: 24 G Needle length: 10 cm Assessment Sensory level: T4   

## 2016-02-03 NOTE — Anesthesia Preprocedure Evaluation (Signed)
Anesthesia Evaluation  Patient identified by MRN, date of birth, ID band Patient awake    Reviewed: Allergy & Precautions, NPO status , Patient's Chart, lab work & pertinent test results  History of Anesthesia Complications Negative for: history of anesthetic complications  Airway Mallampati: III  TM Distance: >3 FB Neck ROM: Full    Dental  (+) Teeth Intact   Pulmonary neg shortness of breath, neg COPD, neg recent URI, former smoker,    breath sounds clear to auscultation       Cardiovascular negative cardio ROS   Rhythm:Regular     Neuro/Psych negative neurological ROS  negative psych ROS   GI/Hepatic negative GI ROS, Neg liver ROS,   Endo/Other  diabetesMorbid obesity  Renal/GU negative Renal ROS     Musculoskeletal   Abdominal   Peds  Hematology  (+) anemia ,   Anesthesia Other Findings   Reproductive/Obstetrics (+) Pregnancy                             Anesthesia Physical Anesthesia Plan  ASA: III  Anesthesia Plan: Spinal   Post-op Pain Management:    Induction:   Airway Management Planned:   Additional Equipment:   Intra-op Plan:   Post-operative Plan:   Informed Consent: I have reviewed the patients History and Physical, chart, labs and discussed the procedure including the risks, benefits and alternatives for the proposed anesthesia with the patient or authorized representative who has indicated his/her understanding and acceptance.   Dental advisory given  Plan Discussed with: CRNA and Surgeon  Anesthesia Plan Comments:         Anesthesia Quick Evaluation

## 2016-02-03 NOTE — Transfer of Care (Signed)
Immediate Anesthesia Transfer of Care Note  Patient: [name]  Procedure(s) Performed: Procedure(s): CESAREAN SECTION (N/A)  Patient Location: PACU  Anesthesia Type:Spinal  Level of Consciousness: awake, alert  and oriented  Airway & Oxygen Therapy: Patient Spontanous Breathing  Post-op Assessment: Report given to RN and Post -op Vital signs reviewed and stable  Post vital signs: Reviewed and stable  Last Vitals:  Vitals:   02/03/16 1018 02/03/16 1036  BP:  106/67  Pulse:  (!) 112  Resp: 18 18  Temp:      Last Pain:  Vitals:   02/03/16 1114  TempSrc:   PainSc: 0-No pain         Complications: No apparent anesthesia complications

## 2016-02-03 NOTE — Lactation Note (Signed)
This note was copied from a baby's chart. Lactation Consultation Note  Patient Name: Christine Singleton Reason for consult: Initial assessment Baby at 7 hr of life. Upon entry baby was sleeping sts with mom. Mom stated having cracked and bleeding nipples for 2638m with her older child so she stopped bf. She reports this baby is latching well. She denies breast or nipple pain, voiced no concerns. Discussed baby behavior, feeding frequency, baby belly size, voids, wt loss, breast changes, and nipple care. She stated she can manually express and has a spoon in room. Given lactation handouts. Aware of OP services and support group.    Maternal Data Has patient been taught Hand Expression?: Yes Does the patient have breastfeeding experience prior to this delivery?: Yes  Feeding Feeding Type: Breast Fed Length of feed: 10 min  LATCH Score/Interventions                      Lactation Tools Discussed/Used WIC Program: Yes   Consult Status Consult Status: Follow-up Date: 02/04/16 Follow-up type: In-patient    Christine Singleton Singleton, 9:09 PM

## 2016-02-03 NOTE — Op Note (Signed)
Tajha Cavell PROCEDURE DATE: 02/03/2016  PREOPERATIVE DIAGNOSIS: Intrauterine pregnancy at  2661w0d weeks gestation  POSTOPERATIVE DIAGNOSIS: The same  PROCEDURE:    Low Transverse Cesarean Section  SURGEON:  Dr. Elsie LincolnKelly Ryoma Nofziger  INDICATIONS: Nicholaus CorollaDashay Haverland is a 25 y.o. [id number] at 9161w0d for elective rpt cesarean section.  The risks of cesarean section discussed with the patient included but were not limited to: bleeding which may require transfusion or reoperation; infection which may require antibiotics; injury to bowel, bladder, ureters or other surrounding organs; injury to the fetus; need for additional procedures including hysterectomy in the event of a life-threatening hemorrhage; placental abnormalities wth subsequent pregnancies, incisional problems, thromboembolic phenomenon and other postoperative/anesthesia complications. The patient concurred with the proposed plan, giving informed written consent for the procedure.    FINDINGS:  Viable female  infant in cephalic presentation, 9,9 Apgars, weight to be determined in 1 hour, clear amniotic fluid.  Intact placenta, three vessel cord.  Grossly normal uterus, ovaries and fallopian tubes. .   ANESTHESIA:    Spinal  ESTIMATED BLOOD LOSS: 300 cc  SPECIMENS: Placenta sent to L & D  COMPLICATIONS: None immediate  PROCEDURE IN DETAIL:  The patient received intravenous antibiotics and had sequential compression devices applied to her lower extremities.  Spinal anesthesia was administered and was found to be adequate. She was then placed in a dorsal supine position with a leftward tilt, and prepped and draped in a sterile manner.  A foley catheter was placed into her bladder and attached to constant gravity.  After an adequate timeout was performed, a Pfannenstiel skin incision was made with scalpel and carried through to the underlying layer of fascia. The fascia was incised in the midline and this incision was extended bilaterally using the Mayo  scissors. Kocher clamps were applied to the superior aspect of the fascial incision and the underlying rectus muscles were dissected off bluntly. A similar process was carried out on the inferior aspect of the facial incision. The rectus muscles were separated in the midline bluntly and the peritoneum was entered bluntly.   A transverse hysterotomy was made with a scalpel and extended bilaterally bluntly. The bladder blade was then removed. The infant was successfully delivered, and cord was clamped and cut and infant was handed over to awaiting neonatology team. Uterine massage was then administered and the placenta delivered intact with three-vessel cord. The uterus was cleared of clot and debris.  The hysterotomy was closed with 0 vicryl. The peritoneum and rectus muscles were noted to be hemostatic.  The peritoneum was closed with 0-Vicryl.  The fascia was closed with 0-Vicryl in a running fashion with good restoration of anatomy.  The subcutaneus tissue was copiously irrigated.  The skin was closed with 4-0 Vicryl in a subcuticular fashion.  PICO vacuum placed to help prevent wound infection.    Pt tolerated the procedure will.  All counts were correct x2.  Pt went to the recovery room in stable condition.

## 2016-02-03 NOTE — Anesthesia Postprocedure Evaluation (Signed)
Anesthesia Post Note  Patient: Engineering geologistDashay Damico  Procedure(s) Performed: Procedure(s) (LRB): CESAREAN SECTION (N/A)  Patient location during evaluation: PACU Anesthesia Type: Spinal Level of consciousness: awake Pain management: pain level controlled Vital Signs Assessment: post-procedure vital signs reviewed and stable Respiratory status: spontaneous breathing Cardiovascular status: stable Postop Assessment: spinal receding and no signs of nausea or vomiting Anesthetic complications: no     Last Vitals:  Vitals:   02/03/16 1500 02/03/16 1515  BP: 114/80   Pulse: 70 77  Resp: 15 15  Temp:      Last Pain:  Vitals:   02/03/16 1411  TempSrc: Oral  PainSc:    Pain Goal:                 Noriko Macari

## 2016-02-04 ENCOUNTER — Encounter (HOSPITAL_COMMUNITY): Payer: Self-pay | Admitting: Obstetrics & Gynecology

## 2016-02-04 DIAGNOSIS — O34211 Maternal care for low transverse scar from previous cesarean delivery: Secondary | ICD-10-CM | POA: Diagnosis present

## 2016-02-04 LAB — CBC
HCT: 21.8 % — ABNORMAL LOW (ref 36.0–46.0)
Hemoglobin: 7.3 g/dL — ABNORMAL LOW (ref 12.0–15.0)
MCH: 18.8 pg — ABNORMAL LOW (ref 26.0–34.0)
MCHC: 33.5 g/dL (ref 30.0–36.0)
MCV: 56.2 fL — ABNORMAL LOW (ref 78.0–100.0)
PLATELETS: 411 10*3/uL — AB (ref 150–400)
RBC: 3.88 MIL/uL (ref 3.87–5.11)
RDW: 20.7 % — AB (ref 11.5–15.5)
WBC: 16 10*3/uL — AB (ref 4.0–10.5)

## 2016-02-04 MED ORDER — OXYCODONE HCL 5 MG PO TABS
10.0000 mg | ORAL_TABLET | Freq: Four times a day (QID) | ORAL | Status: DC | PRN
  Administered 2016-02-04 – 2016-02-05 (×3): 10 mg via ORAL
  Filled 2016-02-04 (×3): qty 2

## 2016-02-04 MED ORDER — OXYCODONE HCL 5 MG PO TABS
5.0000 mg | ORAL_TABLET | Freq: Four times a day (QID) | ORAL | Status: DC | PRN
  Administered 2016-02-04: 5 mg via ORAL
  Filled 2016-02-04: qty 1

## 2016-02-04 NOTE — Lactation Note (Signed)
This note was copied from a baby's chart. Lactation Consultation Note  Patient Name: Christine Singleton ZOXWR'UToday's Date: 02/04/2016 Reason for consult: Follow-up assessment   Follow up with mom of 24 hour old infant. Infant with 11 BF for 10-40 minutes, 2 voids and 4 stools in last 24 hours. LATCH Scores 7-9 by bedside RN's. Infant weight 7 lb 8.1 oz with weight loss of 2% since birth.  Mom had infant latched to left breast in the football hold. Infant was positioned well and was at the end of the feeding. Infant came off breast, nipple noted to be rounded when infant came off. Mom with large compressible breasts with everted nipples. Mom reports her breasts are feeling fuller and she is able to express colostrum. Mom denies questions/concerns and denied needing assistance with feedings at this time.   [facility] and is aware to call for appointment after d/c. Mom reports she does not have a pump at home. Enc mom to call out to desk for feeding assistance as needed.    Maternal Data Formula Feeding for Exclusion: Yes Reason for exclusion: Mother's choice to formula and breast feed on admission Has patient been taught Hand Expression?: Yes Does the patient have breastfeeding experience prior to this delivery?: Yes  Feeding Feeding Type: Breast Fed Length of feed: 20 min  LATCH Score/Interventions Latch: Grasps breast easily, tongue down, lips flanged, rhythmical sucking.  Audible Swallowing: A few with stimulation  Type of Nipple: Everted at rest and after stimulation  Comfort (Breast/Nipple): Soft / non-tender     Hold (Positioning): No assistance needed to correctly position infant at breast.  LATCH Score: 9  Lactation Tools Discussed/Used WIC Program: Yes   Consult Status Consult Status: Follow-up Date: 02/05/16 Follow-up type: In-patient    Silas FloodSharon S Riana Tessmer 02/04/2016, 1:39 PM

## 2016-02-04 NOTE — Progress Notes (Signed)
UR chart review completed.  

## 2016-02-04 NOTE — Clinical Social Work Maternal (Signed)
  CLINICAL SOCIAL WORK MATERNAL/CHILD NOTE  Patient Details  Name: Christine Singleton MRN: [id number] Date of Birth: 01-09-1991  Date:  02/04/2016  Clinical Social Worker Initiating Note:  Laurey Arrow Date/ Time Initiated:  02/04/16/1320     Child's Name:  [name]   Legal Guardian:  Mother   Need for Interpreter:  None   Date of Referral:  02/04/16     Reason for Referral:  Current Substance Use/Substance Use During Pregnancy  (Hx of marijuana use. )   Referral Source:      [address]  [address] [place] [address]  Phone number:  [phone]   Household Members:  Self, Minor Children, Significant Other   Natural Supports (not living in the home):  Immediate Family, Spouse/significant other, Extended Family   Professional Supports: None   Employment: Unemployed   Type of Work:     Education:  Database administrator Resources:  [employer]   Other Resources:  [employer], Physicist, medical    Cultural/Religious Considerations Which May Impact Care:  None Reported  Strengths:  Ability to meet basic needs , Engineer, materials , Home prepared for child    Risk Factors/Current Problems:  Substance Use    Cognitive State:  Alert , Able to Concentrate , Linear Thinking    Mood/Affect:  Happy , Comfortable , Interested    CSW Assessment: CSW met with MOB to complete an assessment for SA hx.  When CSW arrived, MOB was relaxing in the bed and infant was laying beside MOB. MOB gave CSW permission to meet with MOB while FOB Joslyn Hy 04/01/1989) was present.  MOB was polite, inviting, and interested in meeting with CSW. CSW inquired about MOB's SA hx and MOB acknowledged a SA hx. MOB reported the last use of marijuana was in March.  CSW informed MOB that MOB's medical records indicated positive screens for marijuana on 07/31/15 and 11/15/2015. MOB was silent for a few seconds and after prompting by FOB, MOB communicated that MOB's last use was in  September. CSW informed MOB of the hospital's drug screen policy, and informed MOB of the 2 screenings for the infant.  CSW explained that CSW will monitor the infant's UDS and Cord will make a report to [place], if the infant has a positive screen without an explanation. CSW educated MOB about PPD. CSW informed MOB of possible supports and interventions to decrease PPD.  CSW also encouraged MOB to seek medical attention if needed for increased signs and symptoms for PPD. CSW reviewed safe sleep, and SIDS. FOB and MOB asked appropriate questions and appeared to be knowledgeable.  MOB communicated that she has a bassinet for the baby, and feels prepared for the infant.  MOB did not have any further questions, concerns, or needs at this time.  CSW Plan/Description:  Information/Referral to [employer] , Engineer, mining  (CSW will monitor infant's UDS and cord screen and will make a report to [place] if warrnted. )   CSW is awaiting UDS results for infant.   Laurey Arrow, MSW, LCSW Clinical Social Work [phone]    Dimple Nanas, LCSW 02/04/2016, 1:30 PM

## 2016-02-04 NOTE — Addendum Note (Signed)
Addendum  created 02/04/16 1115 by Rica RecordsAngela Natilee Gauer, CRNA   Sign clinical note

## 2016-02-04 NOTE — Anesthesia Postprocedure Evaluation (Signed)
Anesthesia Post Note  Patient: Engineering geologistDashay Strayer  Procedure(s) Performed: Procedure(s) (LRB): CESAREAN SECTION (N/A)  Patient location during evaluation: Mother Baby Anesthesia Type: Spinal Level of consciousness: oriented and awake and alert Pain management: pain level controlled Vital Signs Assessment: post-procedure vital signs reviewed and stable Respiratory status: spontaneous breathing, respiratory function stable and patient connected to nasal cannula oxygen Cardiovascular status: blood pressure returned to baseline and stable Postop Assessment: no headache, no backache and patient able to bend at knees Anesthetic complications: no     Last Vitals:  Vitals:   02/04/16 0616 02/04/16 0930  BP: (!) 97/53 (!) 108/50  Pulse: 73 72  Resp: 18 20  Temp: 36.4 C 36.5 C    Last Pain:  Vitals:   02/04/16 0930  TempSrc:   PainSc: 5    Pain Goal: Patients Stated Pain Goal: 5 (02/04/16 0930)               Rica RecordsICKELTON,Mackynzie Woolford

## 2016-02-04 NOTE — Progress Notes (Signed)
Subjective: Postpartum Day 1: Cesarean Delivery Patient reports incisional pain and tolerating PO.    Objective: Vital signs in last 24 hours: Temp:  [97.5 F (36.4 C)-98.2 F (36.8 C)] 97.5 F (36.4 C) (11/20 0616) Pulse Rate:  [59-112] 73 (11/20 0616) Resp:  [14-20] 18 (11/20 0616) BP: (84-128)/(48-80) 97/53 (11/20 0616) SpO2:  [97 %-100 %] 99 % (11/20 16100616)  Physical Exam:  General: alert, cooperative and no distress Lochia: appropriate Uterine Fundus: firm Incision: healing well, no significant drainage, no dehiscence, no significant erythema DVT Evaluation: No evidence of DVT seen on physical exam.   Recent Labs  02/03/16 1905 02/04/16 0535  HGB 8.5* 7.3*  HCT 26.1* 21.8*    Assessment/Plan: Status post Cesarean section. Doing well postoperatively.  Continue current care.  Christine Singleton 02/04/2016, 6:48 AM

## 2016-02-04 NOTE — Progress Notes (Signed)
Admission nutrition screen triggered for unintentional weight loss > 10 lbs within the last month. . Patients chart reviewed and assessed  for nutritional risk. Patient is determined to be at low nutrition  risk.    Hermie Reagor M.Ed. R.D. LDN Neonatal Nutrition Support Specialist/RD III Pager [phone]      Phone [phone]  

## 2016-02-05 MED ORDER — SENNOSIDES-DOCUSATE SODIUM 8.6-50 MG PO TABS: 2.0000 | ORAL_TABLET | ORAL | 0 refills | Status: DC

## 2016-02-05 MED ORDER — IBUPROFEN 600 MG PO TABS: 600.0000 mg | ORAL_TABLET | Freq: Four times a day (QID) | ORAL | 0 refills | Status: DC

## 2016-02-05 MED ORDER — FERROUS SULFATE 325 (65 FE) MG PO TABS: 325.0000 mg | ORAL_TABLET | Freq: Two times a day (BID) | ORAL | 1 refills | Status: DC

## 2016-02-05 MED ORDER — ACETAMINOPHEN 325 MG PO TABS: 650.0000 mg | ORAL_TABLET | ORAL | 0 refills | Status: DC | PRN

## 2016-02-05 MED ORDER — OXYCODONE HCL 5 MG PO TABS: 5.0000 mg | ORAL_TABLET | Freq: Four times a day (QID) | ORAL | 0 refills | Status: DC | PRN

## 2016-02-05 MED ORDER — TETANUS-DIPHTH-ACELL PERTUSSIS 5-2.5-18.5 LF-MCG/0.5 IM SUSP: 0.5000 mL | Freq: Once | INTRAMUSCULAR | 0 refills | Status: DC

## 2016-02-05 NOTE — Lactation Note (Signed)
This note was copied from a baby's chart. Lactation Consultation Note  Patient Name: Boy Nicholaus CorollaDashay Senna ZOXWR'UToday's Date: 02/05/2016 Reason for consult: Follow-up assessment Follow up visit made prior to discharge.  Mom is very sleepy and unable to keep eyes open during visit.  Baby is positioned in football hold nursing actively.  Reviewed discharge teaching but not confident mom is able to hear all of teaching due to sleepiness.  Manual pump given per Mom's request.  Instructed on use, cleaning and EBM storage.  Reviewed outpatient lactation services and support and encouraged to call prn.  Maternal Data    Feeding Feeding Type: Breast Fed Length of feed: 30 min  LATCH Score/Interventions Latch: Grasps breast easily, tongue down, lips flanged, rhythmical sucking.  Audible Swallowing: A few with stimulation Intervention(s): Alternate breast massage  Type of Nipple: Everted at rest and after stimulation  Comfort (Breast/Nipple): Soft / non-tender     Hold (Positioning): No assistance needed to correctly position infant at breast. Intervention(s): Breastfeeding basics reviewed  LATCH Score: 9  Lactation Tools Discussed/Used     Consult Status Consult Status: Complete    Huston FoleyMOULDEN, Letisha Yera S 02/05/2016, 2:24 PM

## 2016-02-05 NOTE — Discharge Summary (Signed)
Entered in error - see other discharge summary

## 2016-02-05 NOTE — Progress Notes (Signed)
Infant's UDS was negative; no barriers to d/c. CSW will continue to follow infant's cord screen and will make a report to CPS if needed.   Blaine HamperAngel Boyd-Gilyard, MSW, LCSW Clinical Social Work [phone]

## 2016-02-05 NOTE — Discharge Summary (Signed)
OB Discharge Summary     Patient Name: Christine CorollaDashay Helle DOB: 1990-04-14 MRN: [id number]  Date of admission: 02/03/2016 Delivering MD: Elsie LincolnLEGGETT, KELLY H   Date of discharge: 02/05/2016  Admitting diagnosis: cpt [id number] - REPEAT c-section Intrauterine pregnancy: 1948w0d     Secondary diagnosis:  Active Problems:   Delivery by elective cesarean section   Encounter for maternal care for low transverse scar from repeat cesarean delivery  Additional problems: Repeat C-section, pregnancy complicated by gonorrhea that was treated     Discharge diagnosis: Term Pregnancy Delivered                                                                                                Post partum procedures:None  Augmentation: N/A  Complications: None  Hospital course:  Scheduled C/S   25 y.o. yo G2P2002 at 7448w0d was admitted to the hospital 02/03/2016 for scheduled cesarean section with the following indication:Elective Repeat.  Membrane Rupture Time/Date: 1:15 PM ,02/03/2016   Patient delivered a Viable infant.02/03/2016  Details of operation can be found in separate operative note.  Pateint had an uncomplicated postpartum course.  She is ambulating, tolerating a regular diet, passing flatus, and urinating well. Patient is discharged home in stable condition on  02/05/16        Physical exam Vitals:   02/04/16 1800 02/04/16 1900 02/04/16 1920 02/05/16 0535  BP: 117/61 117/61 (!) 100/54 129/79  Pulse: 80 80 80 93  Resp: 18 18 20 18   Temp: 97.6 F (36.4 C) 97.6 F (36.4 C) 98.1 F (36.7 C) 97.9 F (36.6 C)  TempSrc: Oral Oral Oral Oral  SpO2:       General: alert, cooperative and no distress Lochia: appropriate Uterine Fundus: firm Incision: Healing well with no significant drainage, No significant erythema, Dressing is clean, dry, and intact DVT Evaluation: No evidence of DVT seen on physical exam. Labs: Lab Results  Component Value Date   WBC 16.0 (H) 02/04/2016   HGB 7.3 (L)  02/04/2016   HCT 21.8 (L) 02/04/2016   MCV 56.2 (L) 02/04/2016   PLT 411 (H) 02/04/2016   CMP Latest Ref Rng & Units 06/19/2015  Glucose 65 - 99 mg/dL 82  BUN 6 - 20 mg/dL <6(O<5(L)  Creatinine 1.300.44 - 1.00 mg/dL 8.650.63  Sodium 784135 - 696145 mmol/L 138  Potassium 3.5 - 5.1 mmol/L 3.4(L)  Chloride 101 - 111 mmol/L 106  CO2 22 - 32 mmol/L 20(L)  Calcium 8.9 - 10.3 mg/dL 9.3  Total Protein 6.5 - 8.1 g/dL 7.8  Total Bilirubin 0.3 - 1.2 mg/dL 0.7  Alkaline Phos 38 - 126 U/L 92  AST 15 - 41 U/L 18  ALT 14 - 54 U/L 13(L)   Discharge instruction: per After Visit Summary and "Baby and Me Booklet".  After visit meds:    Medication List    TAKE these medications   acetaminophen 325 MG tablet Commonly known as:  TYLENOL Take 2 tablets (650 mg total) by mouth every 4 (four) hours as needed (for pain scale < 4).   ferrous sulfate 325 (65 FE) MG tablet Commonly  known as:  FERROUSUL Take 1 tablet (325 mg total) by mouth 2 (two) times daily.   ibuprofen 600 MG tablet Commonly known as:  ADVIL,MOTRIN Take 1 tablet (600 mg total) by mouth every 6 (six) hours.   oxyCODONE 5 MG immediate release tablet Commonly known as:  Oxy IR/ROXICODONE Take 1 tablet (5 mg total) by mouth every 6 (six) hours as needed for moderate pain.   Prenatal Vitamins 0.8 MG tablet Take 1 tablet by mouth daily.   senna-docusate 8.6-50 MG tablet Commonly known as:  Senokot-S Take 2 tablets by mouth daily. Start taking on:  02/06/2016      Diet: routine diet  Activity: Advance as tolerated. Pelvic rest for 6 weeks.   Outpatient follow up:6 weeks Follow up Appt:Future Appointments Date Time Provider Department Center  03/05/2016 1:20 PM Marylene LandKathryn Lorraine Kooistra, CNM [facility]   Follow up Visit:No Follow-up on file.  Postpartum contraception: Nexplanon  Newborn Data: Live born female  Birth Weight: 7 lb 10.6 oz (3475 g) APGAR: 9, 9  Baby Feeding: Breast Disposition:home with mother   02/05/2016 Freddrick MarchYashika  Amin, MD  CNM attestation I have seen and examined this patient and agree with above documentation in the resident's note.   Christine Singleton is a 25 y.o. G2P2002 s/p rLTCS.   Pain is well controlled.  Plan for birth control is Nexplanon.  Method of Feeding: breast  PE:  BP 129/79 (BP Location: Left Arm)   Pulse 93   Temp 97.9 F (36.6 C) (Oral)   Resp 18   LMP 05/02/2015   SpO2 99%   Breastfeeding? Unknown  Fundus firm  No results for input(s): HGB, HCT in the last 72 hours.   Plan: discharge today - postpartum care discussed - f/u clinic in 6 weeks for postpartum visit   Zakk Borgen, CNM 12:15 AM

## 2016-02-05 NOTE — Discharge Instructions (Signed)

## 2016-02-07 LAB — TYPE AND SCREEN
ABO/RH(D): O POS
Antibody Screen: NEGATIVE
UNIT DIVISION: 0
UNIT DIVISION: 0
Unit division: 0
Unit division: 0

## 2016-02-11 NOTE — Progress Notes (Signed)
Positive cord screen results for [facility] for infnat  CSW made a CPS report to [place] CPS, Pam Miller.  Information also faxed to pediatrician's office ([facility], [address]).  Justan Gaede Boyd-Gilyard, MSW, LCSW Clinical Social Work [phone]   

## 2016-03-05 ENCOUNTER — Ambulatory Visit: Payer: Self-pay | Admitting: Student

## 2016-04-22 ENCOUNTER — Encounter: Payer: Self-pay | Admitting: Advanced Practice Midwife

## 2016-04-22 ENCOUNTER — Ambulatory Visit (INDEPENDENT_AMBULATORY_CARE_PROVIDER_SITE_OTHER): Payer: Medicaid Other | Admitting: Advanced Practice Midwife

## 2016-04-22 VITALS — BP 93/56 | HR 95 | Wt 237.8 lb

## 2016-04-22 DIAGNOSIS — Z98891 History of uterine scar from previous surgery: Secondary | ICD-10-CM

## 2016-04-22 LAB — POCT PREGNANCY, URINE: PREG TEST UR: NEGATIVE

## 2016-04-22 NOTE — Progress Notes (Signed)
Subjective:   had unprotected intercourse  About a week ago.  Christine Singleton is a 26 y.o. female who presents for a postpartum visit. She is 12 weeks postpartum following a low cervical transverse Cesarean section. I have fully reviewed the prenatal and intrapartum course. The delivery was at 39 gestational weeks. Outcome: repeat cesarean section, low transverse incision. Anesthesia: spinal. Postpartum course has been uneventful. Baby's course has been uneventful. Baby is feeding by bottle - Similac Advance. Bleeding no bleeding. Bowel function is normal. Bladder function is normal. Patient is sexually active. Contraception method is none. Postpartum depression screening: negative.  The following portions of the patient's history were reviewed and updated as appropriate: allergies, current medications, past family history, past medical history, past social history, past surgical history and problem list.  Review of Systems Pertinent items are noted in HPI.   Objective:    There were no vitals taken for this visit.  General:  alert and no distress   Breasts:  inspection negative, no nipple discharge or bleeding, no masses or nodularity palpable  Lungs: clear to auscultation bilaterally  Heart:  regular rate and rhythm, S1, S2 normal, no murmur, click, rub or gallop  Abdomen: soft, non-tender; bowel sounds normal; no masses,  no organomegaly   Vulva:  not evaluated  Vagina: not evaluated  Cervix:  n/a  Corpus: not examined  Adnexa:  not evaluated  Rectal Exam: Not performed.        Assessment:     Normal postpartum exam. Pap smear not done at today's visit.   Plan:    1. Contraception: Nexplanon 2. Discussed need to wait two weeks for contraception.  Discussed possible bleeding issues with Nexplanon. Pt accepts these. Will bring back for insertion. 3. Follow up in: 2 weeks or as needed.

## 2016-04-22 NOTE — Patient Instructions (Signed)
Hormonal Contraception Information Introduction Estrogen and progesterone (progestin) are hormones used in many forms of birth control (contraception). These two hormones make up most hormonal contraceptives. Hormonal contraceptives use either:  A combination of estrogen hormone and progesterone hormone in one of these forms:  Pill. Pills come in various combinations of active hormone pills and nonhormonal pills. Different combinations of pills may give you a period once a month, once every 3 months, or no period at all. It is important to take the pills the same time each day.  Patch. The patch is placed on the lower abdomen every week for 3 weeks. On the fourth week, the patch is not placed.  Vaginal ring. The ring is placed in the vagina and left there for 3 weeks. It is then removed for 1 week.  Progesterone alone in one of these forms:  Pill. Hormone pills are taken every day of the cycle.  Intrauterine device (IUD). The IUD is inserted during a menstrual period and removed or replaced every 5 years or sooner.  Implant. Plastic rods are placed under the skin of the upper arm. They are removed or replaced every 3 years or sooner.  Injection. The injection is given once every 90 days. Pregnancy can still occur with any of these hormonal contraceptive methods. If you have any suspicion that you might be pregnant, take a pregnancy test and talk to your health care provider. Estrogen and progesterone contraceptives Estrogen and progesterone contraceptives can prevent pregnancy by:  Stopping the release of an egg (ovulation).  Thickening the mucus of the cervix, making it difficult for sperm to enter the uterus.  Changing the lining of the uterus. This change makes it more difficult for an egg to implant. Progesterone contraceptives Progesterone-only contraceptives can prevent pregnancy by:  Blocking ovulation. This occurs in many women, but some women will continue to  ovulate.  Preventing the entry of sperm into the uterus by keeping the cervical mucus thick and sticky.  Changing the lining of the uterus. This change makes it more difficult for an egg to implant. Side effects Talk to your health care provider about what side effects may affect you. If you develop persistent side effects or if the effects are severe, talk to your health care provider.  Estrogen. Side effects from estrogen occur more often in the first 2-3 months. They include:  Progesterone. Side effects of progesterone can vary. They include: Questions to ask This information is not intended to replace advice given to you by your health care provider. Make sure you discuss any questions you have with your health care provider. Document Released: 03/23/2007 Document Revised: 12/05/2015 Document Reviewed: 08/15/2012  2017 Elsevier  

## 2016-04-23 DIAGNOSIS — Z98891 History of uterine scar from previous surgery: Secondary | ICD-10-CM | POA: Insufficient documentation

## 2016-04-23 HISTORY — DX: History of uterine scar from previous surgery: Z98.891

## 2016-05-12 ENCOUNTER — Ambulatory Visit: Payer: Self-pay | Admitting: Obstetrics and Gynecology

## 2016-06-24 ENCOUNTER — Ambulatory Visit: Payer: Self-pay

## 2016-07-02 ENCOUNTER — Ambulatory Visit: Payer: Self-pay | Admitting: Certified Nurse Midwife

## 2016-07-02 ENCOUNTER — Encounter: Payer: Self-pay | Admitting: *Deleted

## 2016-07-02 NOTE — Progress Notes (Signed)
Christine Singleton missed her scheduled appointment for nexplanon. No need to call patient per provider, may reschedule if she calls.

## 2016-07-22 ENCOUNTER — Ambulatory Visit (INDEPENDENT_AMBULATORY_CARE_PROVIDER_SITE_OTHER): Payer: Medicaid Other | Admitting: *Deleted

## 2016-07-22 ENCOUNTER — Other Ambulatory Visit (HOSPITAL_COMMUNITY)
Admission: RE | Admit: 2016-07-22 | Discharge: 2016-07-22 | Disposition: A | Discharge status: Home / Self Care | Payer: Medicaid Other | Source: Ambulatory Visit | Attending: Family Medicine | Admitting: Family Medicine

## 2016-07-22 DIAGNOSIS — Z113 Encounter for screening for infections with a predominantly sexual mode of transmission: Secondary | ICD-10-CM | POA: Diagnosis not present

## 2016-07-22 DIAGNOSIS — N898 Other specified noninflammatory disorders of vagina: Secondary | ICD-10-CM | POA: Diagnosis present

## 2016-07-22 NOTE — Progress Notes (Signed)
Pt reports white vaginal discharge with itching x1 week - denies odor.  She would like testing for STI as well as yeast and BV. She also would like to reschedule her missed appt for Nexplanon insertion.  Self vaginal swab obtained.  Pt will be called with results.

## 2016-07-23 LAB — CERVICOVAGINAL ANCILLARY ONLY
Bacterial vaginitis: POSITIVE — AB
CANDIDA VAGINITIS: POSITIVE — AB
Chlamydia: NEGATIVE
Neisseria Gonorrhea: NEGATIVE
Trichomonas: POSITIVE — AB

## 2016-07-24 ENCOUNTER — Telehealth: Payer: Self-pay | Admitting: *Deleted

## 2016-07-24 ENCOUNTER — Other Ambulatory Visit: Payer: Self-pay | Admitting: Family Medicine

## 2016-07-24 MED ORDER — MICONAZOLE NITRATE 2 % VA CREA: 1.0000 | TOPICAL_CREAM | Freq: Every day | VAGINAL | 2 refills | Status: DC

## 2016-07-24 MED ORDER — METRONIDAZOLE 500 MG PO TABS: 500.0000 mg | ORAL_TABLET | Freq: Two times a day (BID) | ORAL | 0 refills | Status: DC

## 2016-07-24 NOTE — Telephone Encounter (Addendum)
Called pt and informed her of test results and treatment as prescribed by Dr. Adrian BlackwaterStinson. Pt advised that partner needs treatment for trichomonas and no sex until 2 weeks after their treatment.  Pt voiced understanding. ----- Message from Levie HeritageJacob J Stinson, DO sent at 07/24/2016  8:23 AM EDT ----- + BV, Trichomonas, and yeast. Flagyl 500mg  BID x7 days and monistat prescribed. Please inform patient. Partner needs treatment as well. No intercourse until both have been treated x2weeks.

## 2016-08-13 ENCOUNTER — Ambulatory Visit: Payer: Self-pay | Admitting: Advanced Practice Midwife

## 2016-11-24 IMAGING — US US MFM OB DETAIL+14 WK
1 series · 14 of 28 positions shown · non-contrast
Comparison: none

[Series 1: us mfm ob detail+14 wk · 92 acquisitions, 14 frames shown]
[im 4/92]
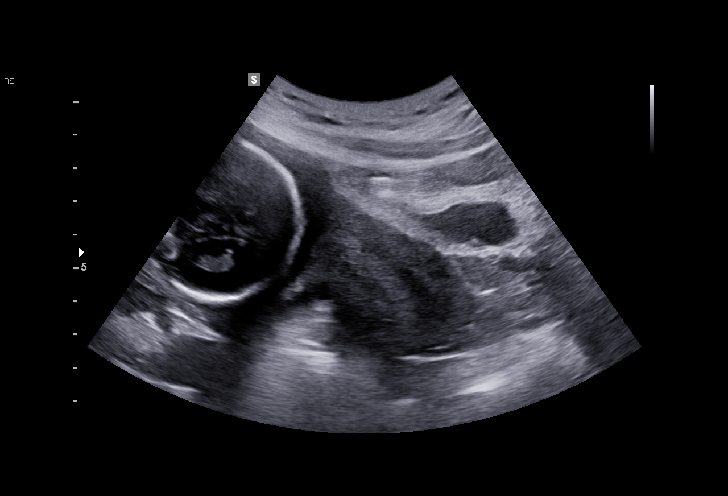
[im 11/92]
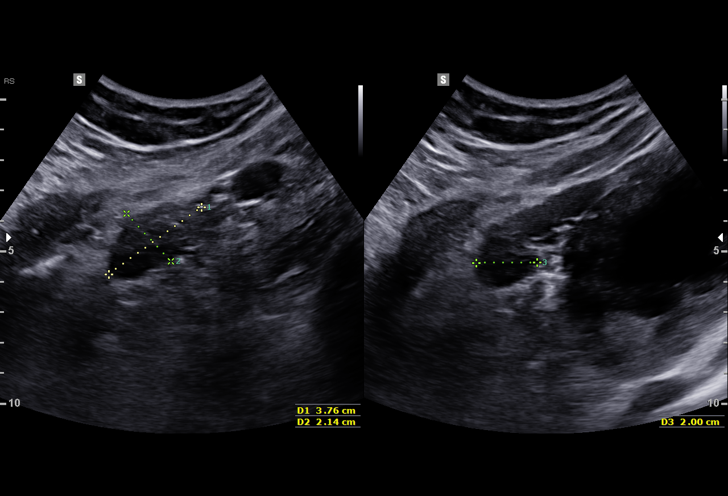
[im 17/92]
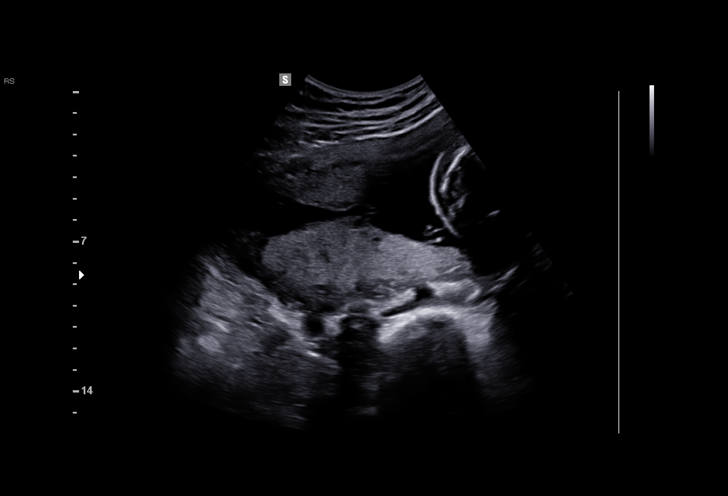
[im 24/92]
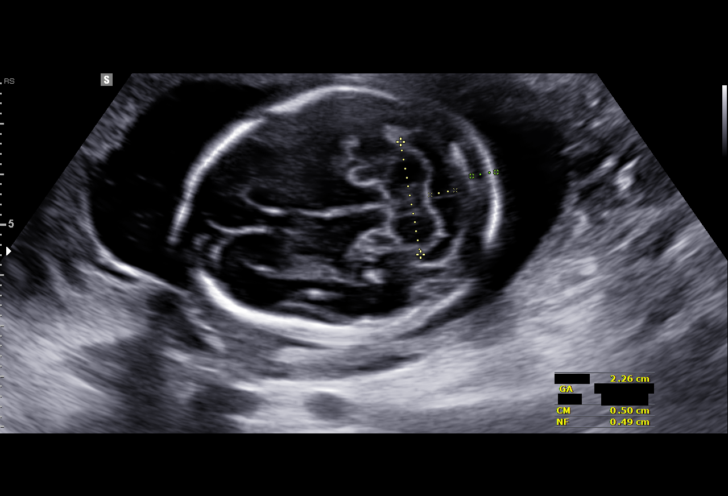
[im 31/92]
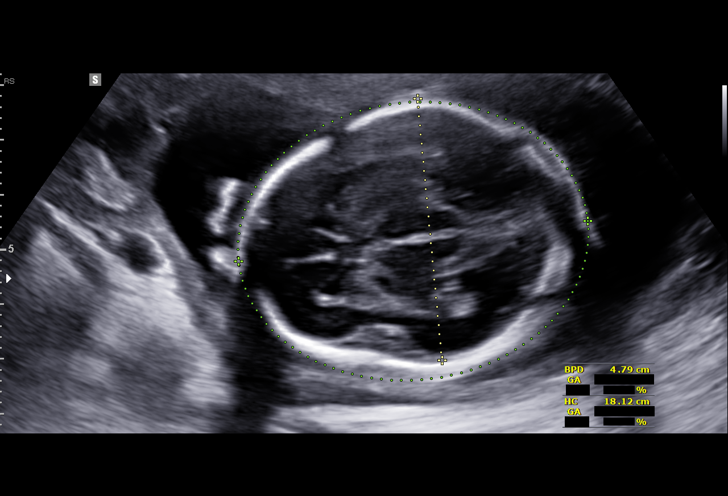
[im 38/92]
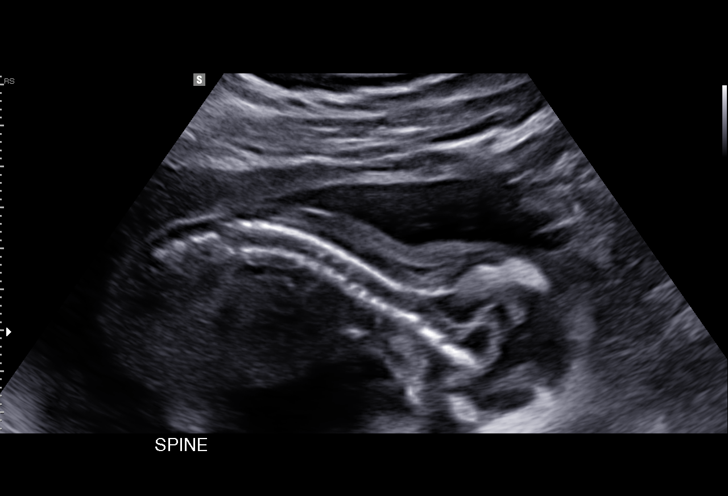
[im 44/92]
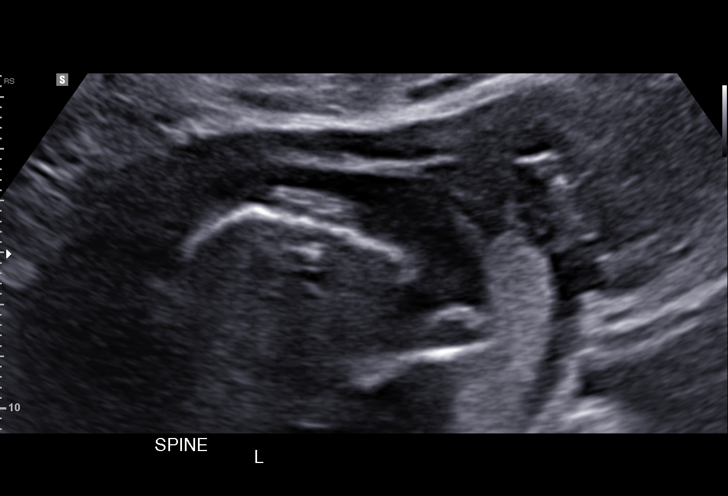
[im 51/92]
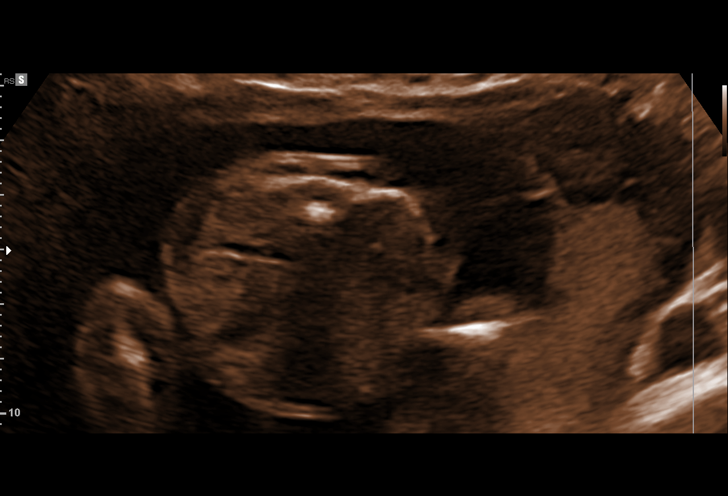
[im 58/92]
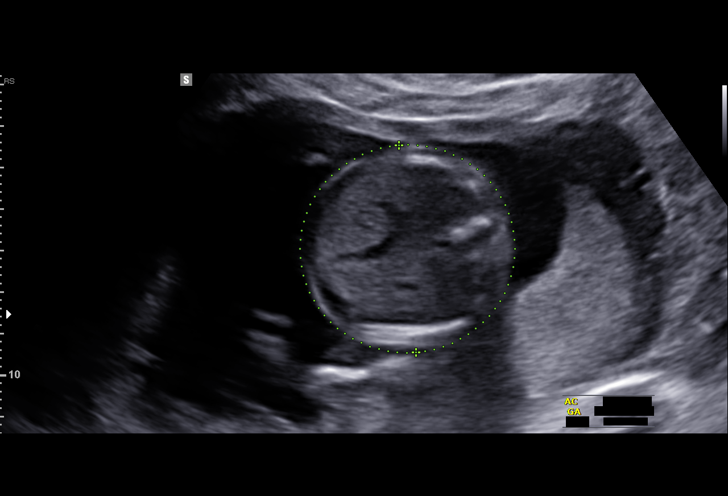
[im 65/92]
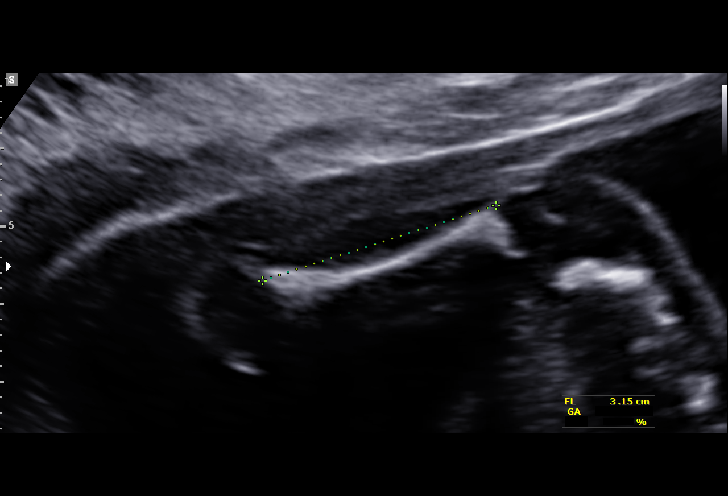
[im 71/92]
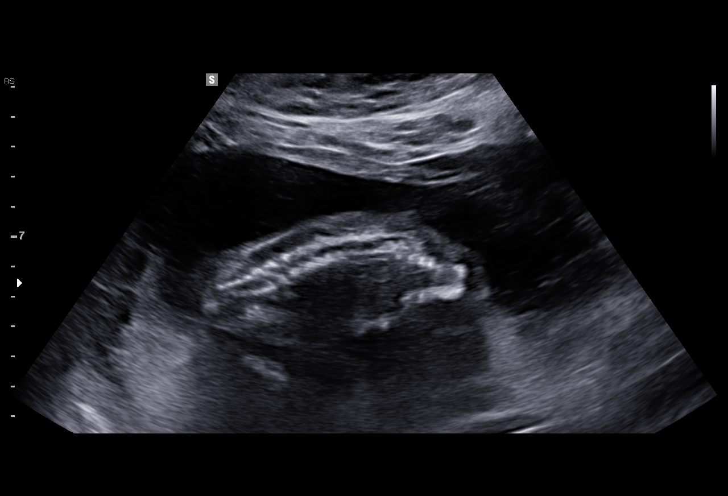
[im 78/92]
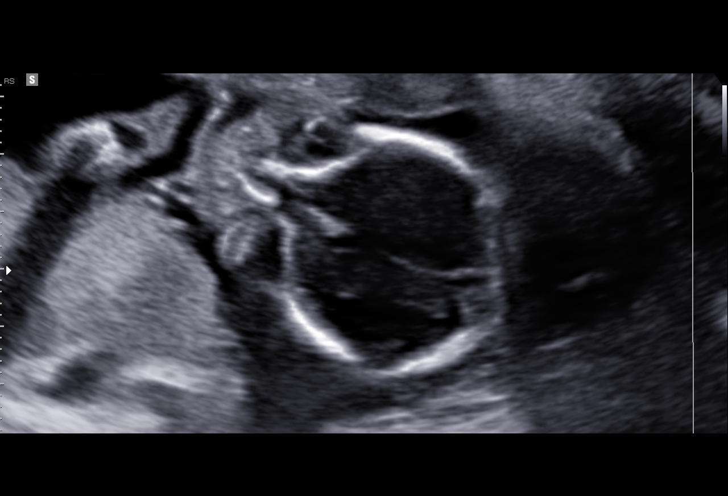
[im 85/92]
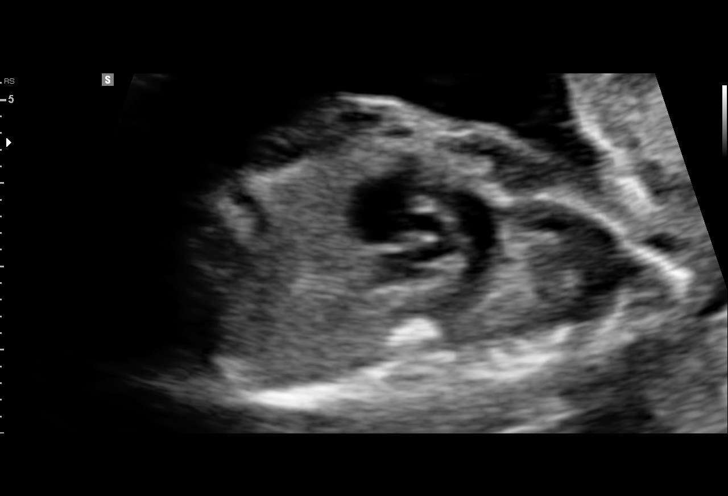
[im 92/92]
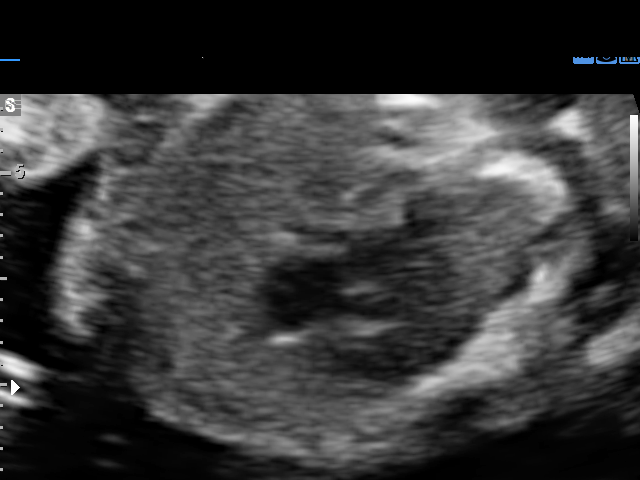

[14 of 28 positions shown; findings below may reference images not displayed]

[facility]
[REDACTED]-
Faculty Physician

1  MACHO TIGER            [id number]      [id number]     [id number]
Indications

20 weeks gestation of pregnancy
Detailed fetal anatomic survey                 Z36
Poor obstetric history: Previous gestational
diabetes
Obesity complicating pregnancy, second
trimester
OB History

Blood Type:            Height:  5'2"   Weight (lb):  248      BMI:
Gravidity:    2         Term:   1
Living:       1
Fetal Evaluation

Num Of Fetuses:     1
Fetal Heart         167
Rate(bpm):
Cardiac Activity:   Observed
Presentation:       Cephalic
Placenta:           Posterior, above cervical os
P. Cord Insertion:  Visualized, central

Amniotic Fluid
AFI FV:      Subjectively within normal limits

Largest Pocket(cm)
5.77
Biometry

BPD:      48.2  mm     G. Age:  20w 4d         73  %    CI:        71.85   %   70 - 86
FL/HC:      17.2   %   16.8 -
HC:       181   mm     G. Age:  20w 4d         65  %    HC/AC:      1.15       1.09 -
AC:      156.9  mm     G. Age:  20w 6d         72  %    FL/BPD:     64.7   %
FL:       31.2  mm     G. Age:  19w 5d         31  %    FL/AC:      19.9   %   20 - 24
HUM:      32.2  mm     G. Age:  20w 5d         75  %
CER:      22.6  mm     G. Age:  21w 2d         74  %
NFT:       4.9  mm

CM:          5  mm

Est. FW:     347  gm    0 lb 12 oz      54  %
Gestational Age

LMP:           20w 0d       Date:   05/02/15                 EDD:   02/06/16
U/S Today:     20w 3d                                        EDD:   02/03/16
Best:          20w 0d    Det. By:   LMP  (05/02/15)          EDD:   02/06/16
Anatomy

Cranium:               Appears normal         Aortic Arch:            Not well visualized
Cavum:                 Appears normal         Ductal Arch:            Not well visualized
Ventricles:            Appears normal         Diaphragm:              Not well visualized
Choroid Plexus:        Appears normal         Stomach:                Appears normal, left
sided
Cerebellum:            Appears normal         Abdomen:                Appears normal
Posterior Fossa:       Appears normal         Abdominal Wall:         Not well visualized
Nuchal Fold:           Appears normal         Cord Vessels:           Appears normal (3
vessel cord)
Face:                  Orbits appear          Kidneys:                Appear normal
normal
Lips:                  Appears normal         Bladder:                Appears normal
Thoracic:              Appears normal         Spine:                  Appears normal
Heart:                 Not well visualized    Upper Extremities:      Appears normal
RVOT:                  Appears normal         Lower Extremities:      Appears normal
LVOT:                  Appears normal

Other:  Fetus appears to be a male. Heels visualized. Technically difficult due
to maternal habitus and fetal position.
Cervix Uterus Adnexa

Cervix
Length:           4.17  cm.
Normal appearance by transabdominal scan.

Uterus
No abnormality visualized.

Left Ovary
Within normal limits.

Right Ovary
Within normal limits.

Adnexa:       No abnormality visualized. No adnexal mass
visualized.
Impression

SIUP at 20+0 weeks
Normal detailed fetal anatomy; limited views of heart,
diaphragm and CI
Markers of aneuploidy: none
Normal amniotic fluid volume
Measurements consistent with LMP dating
Recommendations

Follow-up ultrasound in 4-6 weeks to complete anatomy
survey

## 2017-06-22 ENCOUNTER — Encounter: Payer: Self-pay | Admitting: *Deleted

## 2020-02-01 LAB — HM PAP SMEAR

## 2022-08-14 ENCOUNTER — Emergency Department (HOSPITAL_BASED_OUTPATIENT_CLINIC_OR_DEPARTMENT_OTHER): Payer: Medicaid Other

## 2022-08-14 ENCOUNTER — Other Ambulatory Visit (HOSPITAL_BASED_OUTPATIENT_CLINIC_OR_DEPARTMENT_OTHER): Payer: Self-pay

## 2022-08-14 ENCOUNTER — Encounter (HOSPITAL_BASED_OUTPATIENT_CLINIC_OR_DEPARTMENT_OTHER): Payer: Self-pay | Admitting: Emergency Medicine

## 2022-08-14 ENCOUNTER — Other Ambulatory Visit: Payer: Self-pay

## 2022-08-14 ENCOUNTER — Inpatient Hospital Stay (HOSPITAL_BASED_OUTPATIENT_CLINIC_OR_DEPARTMENT_OTHER)
Admission: EM | Admit: 2022-08-14 | Discharge: 2022-08-19 | DRG: 291 | Disposition: A | Discharge status: Home / Self Care | Payer: Medicaid Other | Attending: Internal Medicine | Admitting: Internal Medicine

## 2022-08-14 DIAGNOSIS — R0600 Dyspnea, unspecified: Principal | ICD-10-CM

## 2022-08-14 DIAGNOSIS — D509 Iron deficiency anemia, unspecified: Secondary | ICD-10-CM | POA: Diagnosis present

## 2022-08-14 DIAGNOSIS — J9612 Chronic respiratory failure with hypercapnia: Secondary | ICD-10-CM | POA: Diagnosis present

## 2022-08-14 DIAGNOSIS — E119 Type 2 diabetes mellitus without complications: Secondary | ICD-10-CM | POA: Diagnosis not present

## 2022-08-14 DIAGNOSIS — I110 Hypertensive heart disease with heart failure: Secondary | ICD-10-CM | POA: Diagnosis present

## 2022-08-14 DIAGNOSIS — E66813 Obesity, class 3: Secondary | ICD-10-CM

## 2022-08-14 DIAGNOSIS — Z79899 Other long term (current) drug therapy: Secondary | ICD-10-CM

## 2022-08-14 DIAGNOSIS — Z87891 Personal history of nicotine dependence: Secondary | ICD-10-CM

## 2022-08-14 DIAGNOSIS — E871 Hypo-osmolality and hyponatremia: Secondary | ICD-10-CM

## 2022-08-14 DIAGNOSIS — E874 Mixed disorder of acid-base balance: Secondary | ICD-10-CM | POA: Diagnosis present

## 2022-08-14 DIAGNOSIS — I272 Pulmonary hypertension, unspecified: Secondary | ICD-10-CM | POA: Diagnosis present

## 2022-08-14 DIAGNOSIS — I10 Essential (primary) hypertension: Secondary | ICD-10-CM | POA: Diagnosis not present

## 2022-08-14 DIAGNOSIS — J9601 Acute respiratory failure with hypoxia: Secondary | ICD-10-CM | POA: Insufficient documentation

## 2022-08-14 DIAGNOSIS — D72829 Elevated white blood cell count, unspecified: Secondary | ICD-10-CM | POA: Diagnosis present

## 2022-08-14 DIAGNOSIS — Z6841 Body Mass Index (BMI) 40.0 and over, adult: Secondary | ICD-10-CM | POA: Diagnosis not present

## 2022-08-14 DIAGNOSIS — E662 Morbid (severe) obesity with alveolar hypoventilation: Secondary | ICD-10-CM | POA: Diagnosis present

## 2022-08-14 DIAGNOSIS — D75839 Thrombocytosis, unspecified: Secondary | ICD-10-CM | POA: Diagnosis present

## 2022-08-14 DIAGNOSIS — I5033 Acute on chronic diastolic (congestive) heart failure: Secondary | ICD-10-CM | POA: Insufficient documentation

## 2022-08-14 DIAGNOSIS — E1165 Type 2 diabetes mellitus with hyperglycemia: Secondary | ICD-10-CM | POA: Diagnosis present

## 2022-08-14 DIAGNOSIS — I509 Heart failure, unspecified: Secondary | ICD-10-CM

## 2022-08-14 LAB — BASIC METABOLIC PANEL
Anion gap: 12 (ref 5–15)
BUN: 12 mg/dL (ref 6–20)
CO2: 24 mmol/L (ref 22–32)
Calcium: 8.6 mg/dL — ABNORMAL LOW (ref 8.9–10.3)
Chloride: 99 mmol/L (ref 98–111)
Creatinine, Ser: 0.74 mg/dL (ref 0.44–1.00)
GFR, Estimated: >60 mL/min (ref >60)
Glucose, Bld: 96 mg/dL (ref 70–99)
Potassium: 4.6 mmol/L (ref 3.5–5.1)
Sodium: 135 mmol/L (ref 135–145)

## 2022-08-14 LAB — GLUCOSE, CAPILLARY: Glucose-Capillary: 191 mg/dL — ABNORMAL HIGH (ref 70–99)

## 2022-08-14 LAB — TROPONIN I (HIGH SENSITIVITY)
Troponin I (High Sensitivity): 27 ng/L — ABNORMAL HIGH (ref <18)
Troponin I (High Sensitivity): 24 ng/L — ABNORMAL HIGH (ref <18)

## 2022-08-14 LAB — CBC
HCT: 37.6 % (ref 36.0–46.0)
Hemoglobin: 11.4 g/dL — ABNORMAL LOW (ref 12.0–15.0)
MCH: 19 pg — ABNORMAL LOW (ref 26.0–34.0)
MCHC: 30.3 g/dL (ref 30.0–36.0)
MCV: 62.6 fL — ABNORMAL LOW (ref 80.0–100.0)
Platelets: 418 10*3/uL — ABNORMAL HIGH (ref 150–400)
RBC: 6.01 MIL/uL — ABNORMAL HIGH (ref 3.87–5.11)
RDW: 22.1 % — ABNORMAL HIGH (ref 11.5–15.5)
WBC: 14.6 10*3/uL — ABNORMAL HIGH (ref 4.0–10.5)
nRBC: 0.4 % — ABNORMAL HIGH (ref 0.0–0.2)

## 2022-08-14 LAB — BRAIN NATRIURETIC PEPTIDE: B Natriuretic Peptide: 409.6 pg/mL — ABNORMAL HIGH (ref 0.0–100.0)

## 2022-08-14 LAB — D-DIMER, QUANTITATIVE: D-Dimer, Quant: 2.38 ug/mL-FEU — ABNORMAL HIGH (ref 0.00–0.50)

## 2022-08-14 LAB — PREGNANCY, URINE: Preg Test, Ur: NEGATIVE

## 2022-08-14 MED ORDER — ACETAMINOPHEN 650 MG RE SUPP: 650.0000 mg | Freq: Four times a day (QID) | RECTAL | Status: DC | PRN

## 2022-08-14 MED ORDER — INSULIN ASPART 100 UNIT/ML IJ SOLN: 0.0000 [IU] | Freq: Three times a day (TID) | INTRAMUSCULAR | Status: DC

## 2022-08-14 MED ORDER — PROCHLORPERAZINE EDISYLATE 10 MG/2ML IJ SOLN
5.0000 mg | INTRAMUSCULAR | Status: DC | PRN
  Administered 2022-08-17: 5 mg via INTRAVENOUS
  Filled 2022-08-14: qty 2

## 2022-08-14 MED ORDER — FUROSEMIDE 10 MG/ML IJ SOLN
40.0000 mg | Freq: Once | INTRAMUSCULAR | Status: AC
  Administered 2022-08-14: 40 mg via INTRAVENOUS
  Filled 2022-08-14: qty 4

## 2022-08-14 MED ORDER — ACETAMINOPHEN 325 MG PO TABS
650.0000 mg | ORAL_TABLET | Freq: Four times a day (QID) | ORAL | Status: DC | PRN
  Administered 2022-08-15 – 2022-08-19 (×6): 650 mg via ORAL
  Filled 2022-08-14 (×7): qty 2

## 2022-08-14 MED ORDER — IOHEXOL 350 MG/ML SOLN
100.0000 mL | Freq: Once | INTRAVENOUS | Status: AC | PRN
  Administered 2022-08-14: 100 mL via INTRAVENOUS

## 2022-08-14 MED ORDER — SODIUM CHLORIDE 0.9% FLUSH
3.0000 mL | Freq: Two times a day (BID) | INTRAVENOUS | Status: DC
  Administered 2022-08-14 – 2022-08-19 (×9): 3 mL via INTRAVENOUS

## 2022-08-14 MED ORDER — FUROSEMIDE 20 MG PO TABS
20.0000 mg | ORAL_TABLET | Freq: Every day | ORAL | 0 refills | Status: DC
  Filled 2022-08-14: qty 10, 10d supply, fill #0

## 2022-08-14 MED ORDER — OXYCODONE HCL 5 MG PO TABS
5.0000 mg | ORAL_TABLET | ORAL | Status: DC | PRN
  Administered 2022-08-17 – 2022-08-18 (×2): 5 mg via ORAL
  Filled 2022-08-14 (×2): qty 1

## 2022-08-14 MED ORDER — FUROSEMIDE 10 MG/ML IJ SOLN
40.0000 mg | Freq: Two times a day (BID) | INTRAMUSCULAR | Status: DC
  Administered 2022-08-15 – 2022-08-16 (×3): 40 mg via INTRAVENOUS
  Filled 2022-08-14 (×3): qty 4

## 2022-08-14 MED ORDER — INSULIN ASPART 100 UNIT/ML IJ SOLN: 0.0000 [IU] | Freq: Every day | INTRAMUSCULAR | Status: DC

## 2022-08-14 MED ORDER — SENNOSIDES-DOCUSATE SODIUM 8.6-50 MG PO TABS: 1.0000 | ORAL_TABLET | Freq: Every evening | ORAL | Status: DC | PRN

## 2022-08-14 MED ORDER — ENOXAPARIN SODIUM 80 MG/0.8ML IJ SOSY
80.0000 mg | PREFILLED_SYRINGE | INTRAMUSCULAR | Status: DC
  Administered 2022-08-14 – 2022-08-15 (×2): 80 mg via SUBCUTANEOUS
  Filled 2022-08-14 (×2): qty 0.8

## 2022-08-14 NOTE — ED Provider Notes (Signed)
Discussed with Dr Allena Katz who will admit the patient to the hospital for suspected heart failure exacerbation and hypoxia.   Rondel Baton, MD 08/14/22 [phone]

## 2022-08-14 NOTE — ED Provider Notes (Signed)
EMERGENCY DEPARTMENT AT [facility] Provider Note   CSN: [id number] Arrival date & time: 08/14/22  1126     History  Chief Complaint  Patient presents with   Shortness of Breath    Christine Singleton is a 32 y.o. female.   Shortness of Breath    Patient presents to the ED for evaluation of shortness of breath.  Patient states symptoms have been ongoing for couple of weeks.  She gets short of breath especially when she is lying flat.  She is waking up at night.  She also has felt some heaviness in her chest.  She feels like she is having swelling in her legs mostly on the left.  She denies any fevers or chills.  Patient states she saw her primary care doctor but they were investigating her abnormal menstrual periods  Home Medications Prior to Admission medications   Medication Sig Start Date End Date Taking? Authorizing Provider  furosemide (LASIX) 20 MG tablet Take 1 tablet (20 mg total) by mouth daily. 08/14/22  Yes Linwood Dibbles, MD  [facility] ACETAMINOPHEN 325 MG tablet TAKE 2 TABLETS (650 MG TOTAL) BY MOUTH EVERY 4 (FOUR) HOURS AS NEEDED (FOR PAIN SCALE < 4). 02/05/16   [provider]  [facility] SENNA PLUS 8.6-50 MG tablet Take 2 tablets by mouth daily. 02/05/16   [provider]  ferrous sulfate 325 (65 FE) MG tablet TAKE 1 TABLET (325 MG TOTAL) BY MOUTH 2 (TWO) TIMES DAILY. 02/05/16   [provider]  ibuprofen (ADVIL,MOTRIN) 600 MG tablet TAKE 1 TABLET (600 MG TOTAL) BY MOUTH EVERY 6 (SIX) HOURS. 02/05/16   [provider]  metroNIDAZOLE (FLAGYL) 500 MG tablet Take 1 tablet (500 mg total) by mouth 2 (two) times daily. 07/24/16   Levie Heritage, DO  miconazole (MONISTAT 7) 2 % vaginal cream Place 1 Applicatorful vaginally at bedtime. Apply for seven nights 07/24/16   Levie Heritage, DO  oxyCODONE (OXY IR/ROXICODONE) 5 MG immediate release tablet TAKE 1 TABLET BY MOUTH EVERY 6 HOURS AS NEEDED FOR MODERATE PAIN 02/06/16   [provider]      Allergies    Patient has no known allergies.    Review of Systems   Review of Systems  Respiratory:  Positive for shortness of breath.     Physical Exam Updated Vital Signs BP (!) 151/109   Pulse (!) 107   Temp 98.7 F (37.1 C)   Resp (!) 29   Ht 1.575 m (5\' 2" )   Wt (!) 155.1 kg   SpO2 94%   BMI 62.55 kg/m  Physical Exam Vitals and nursing note reviewed.  Constitutional:      General: She is not in acute distress.    Appearance: She is well-developed. She is not diaphoretic.     Comments: Elevated BMI (>60)  HENT:     Head: Normocephalic and atraumatic.     Right Ear: External ear normal.     Left Ear: External ear normal.  Eyes:     General: No scleral icterus.       Right eye: No discharge.        Left eye: No discharge.     Conjunctiva/sclera: Conjunctivae normal.  Neck:     Trachea: No tracheal deviation.  Cardiovascular:     Rate and Rhythm: Regular rhythm. Tachycardia present.  Pulmonary:     Effort: Pulmonary effort is normal. No tachypnea or respiratory distress.     Breath sounds:  Normal breath sounds. No stridor. No wheezing or rales.  Abdominal:     General: Bowel sounds are normal. There is no distension.     Palpations: Abdomen is soft.     Tenderness: There is no abdominal tenderness. There is no guarding or rebound.  Musculoskeletal:        General: No tenderness or deformity.     Cervical back: Neck supple.     Comments: No pitting edema appreciated bilateral lower extremities  Skin:    General: Skin is warm and dry.     Findings: No rash.  Neurological:     General: No focal deficit present.     Mental Status: She is alert.     Cranial Nerves: No cranial nerve deficit, dysarthria or facial asymmetry.     Sensory: No sensory deficit.     Motor: No abnormal muscle tone or seizure activity.     Coordination: Coordination normal.  Psychiatric:        Mood and Affect: Mood normal.     ED Results / Procedures /  Treatments   Labs (all labs ordered are listed, but only abnormal results are displayed) Labs Reviewed  BASIC METABOLIC PANEL - Abnormal; Notable for the following components:      Result Value   Calcium 8.6 (*)    All other components within normal limits  CBC - Abnormal; Notable for the following components:   WBC 14.6 (*)    RBC 6.01 (*)    Hemoglobin 11.4 (*)    MCV 62.6 (*)    MCH 19.0 (*)    RDW 22.1 (*)    Platelets 418 (*)    nRBC 0.4 (*)    All other components within normal limits  BRAIN NATRIURETIC PEPTIDE - Abnormal; Notable for the following components:   B Natriuretic Peptide 409.6 (*)    All other components within normal limits  D-DIMER, QUANTITATIVE - Abnormal; Notable for the following components:   D-Dimer, Quant 2.38 (*)    All other components within normal limits  TROPONIN I (HIGH SENSITIVITY) - Abnormal; Notable for the following components:   Troponin I (High Sensitivity) 27 (*)    All other components within normal limits  TROPONIN I (HIGH SENSITIVITY) - Abnormal; Notable for the following components:   Troponin I (High Sensitivity) 24 (*)    All other components within normal limits  PREGNANCY, URINE    EKG EKG Interpretation  Date/Time:  Thursday Aug 14 2022 11:41:27 EDT Ventricular Rate:  111 PR Interval:  160 QRS Duration: 102 QT Interval:  360 QTC Calculation: 489 R Axis:   93 Text Interpretation: Sinus tachycardia Rightward axis Nonspecific T wave abnormality Abnormal ECG No previous ECGs available Confirmed by Linwood Dibbles [phone]) on 08/14/2022 11:45:45 AM  Radiology CT Angio Chest PE W and/or Wo Contrast  Result Date: 08/14/2022 CLINICAL DATA:  Pulmonary embolism (PE) suspected, low to intermediate prob, positive D-dimer EXAM: CT ANGIOGRAPHY CHEST WITH CONTRAST TECHNIQUE: Multidetector CT imaging of the chest was performed using the standard protocol during bolus administration of intravenous contrast. Multiplanar CT image reconstructions  and MIPs were obtained to evaluate the vascular anatomy. RADIATION DOSE REDUCTION: This exam was performed according to the departmental dose-optimization program which includes automated exposure control, adjustment of the mA and/or kV according to patient size and/or use of iterative reconstruction technique. CONTRAST:  OMNIPAQUE IOHEXOL 350 MG/ML SOLN COMPARISON:  None Available. FINDINGS: Limitations: Assessment is limited due to poor signal noise ratio. Cardiovascular: Assessment  of the segmental pulmonary arteries limited due to poor visualization. Assessment of the lobar pulmonary arteries is also slightly limited. This limitation no evidence of pulmonary embolism. Normal heart size. No pericardial effusion. Mediastinum/Nodes: No enlarged mediastinal, hilar, or axillary lymph nodes. Thyroid gland, trachea, and esophagus demonstrate no significant findings. Lungs/Pleura: No pleural effusion. No pneumothorax. Linear consolidative opacity in the right middle lobe (series 6, image 132) and in the left perihilar region, favored to represent atelectasis. Upper Abdomen: No acute abnormality. Musculoskeletal: No chest wall abnormality. No acute or significant osseous findings. Review of the MIP images confirms the above findings. IMPRESSION: 1. Assessment of the segmental pulmonary arteries is limited due to poor signal noise ratio. Assessment of the lobar pulmonary arteries is also slightly limited. Within this limitation, no evidence of pulmonary embolism. 2. Linear consolidative opacity in the right middle lobe and in the left perihilar region, favored to represent atelectasis. Electronically Signed   By: Lorenza Cambridge M.D.   On: 08/14/2022 15:09   US Venous Img Lower Unilateral Left  Result Date: 08/14/2022 CLINICAL DATA:  Left leg swelling EXAM: LEFT LOWER EXTREMITY VENOUS DOPPLER ULTRASOUND TECHNIQUE: Gray-scale sonography with graded compression, as well as color Doppler and duplex ultrasound were  performed to evaluate the lower extremity deep venous systems from the level of the common femoral vein and including the common femoral, femoral, profunda femoral, popliteal and calf veins including the posterior tibial, peroneal and gastrocnemius veins when visible. The superficial great saphenous vein was also interrogated. Spectral Doppler was utilized to evaluate flow at rest and with distal augmentation maneuvers in the common femoral, femoral and popliteal veins. COMPARISON:  None Available. FINDINGS: Contralateral Common Femoral Vein: Respiratory phasicity is normal and symmetric with the symptomatic side. No evidence of thrombus. Normal compressibility. Common Femoral Vein: No evidence of thrombus. Normal compressibility, respiratory phasicity and response to augmentation. Saphenofemoral Junction: No evidence of thrombus. Normal compressibility and flow on color Doppler imaging. Profunda Femoral Vein: No evidence of thrombus. Normal compressibility and flow on color Doppler imaging. Femoral Vein: No evidence of thrombus. Normal compressibility, respiratory phasicity and response to augmentation. Popliteal Vein: No evidence of thrombus. Normal compressibility, respiratory phasicity and response to augmentation. Calf Veins: No evidence of thrombus. Normal compressibility and flow on color Doppler imaging. Superficial Great Saphenous Vein: No evidence of thrombus. Normal compressibility. Venous Reflux:  None. Other Findings:  None. IMPRESSION: No evidence of deep venous thrombosis in the left lower extremity. Electronically Signed   By: Jacob Moores M.D.   On: 08/14/2022 13:40   DG Chest 2 View  Result Date: 08/14/2022 CLINICAL DATA:  Shortness of breath EXAM: CHEST - 2 VIEW COMPARISON:  Chest x-ray March 03, 2018 FINDINGS: Limited assessment due to under penetration and overlying soft tissue. Within this limitation, the cardiomediastinal silhouette is unchanged in contour. No focal pulmonary  opacity. No pleural effusion or pneumothorax. The visualized upper abdomen is unremarkable. No acute osseous abnormality. IMPRESSION: No active cardiopulmonary disease. Electronically Signed   By: Jacob Moores M.D.   On: 08/14/2022 12:22    Procedures Procedures    Medications Ordered in ED Medications  furosemide (LASIX) injection 40 mg (has no administration in time range)  iohexol (OMNIPAQUE) 350 MG/ML injection 100 mL (100 mLs Intravenous Contrast Given 08/14/22 1442)    ED Course/ Medical Decision Making/ A&P Clinical Course as of 08/14/22 1556  Thu Aug 14, 2022  1325 Brain natriuretic peptide(!) BNP elevated.  Troponin slightly increased.  D-dimer elevated 2.38 [JK]  1326 X-ray without acute findings [JK]  1439 Ultrasound without signs of DVT [JK]  1512 CT scan does not show any evidence of pneumonia or PE. [JK]  1513 Troponins remain slightly elevated [JK]  1553 Patient attempted walk around the ED. she was able to do so but became very short of breath.  Oxygen saturation dropped into the high 80s [JK]    Clinical Course User Index [JK] Linwood Dibbles, MD                             Medical Decision Making Problems Addressed: BMI 60.0-69.9, adult [facility]): chronic illness or injury Dyspnea, unspecified type: acute illness or injury that poses a threat to life or bodily functions Hypertension, unspecified type: acute illness or injury  Amount and/or Complexity of Data Reviewed Labs: ordered. Decision-making details documented in ED Course. Radiology: ordered.  Risk Prescription drug management.   Patient presented to the ED for evaluation of shortness of breath.  No recent fevers or chills.  Concerned about the possibility of CHF as well as pulmonary embolism.  Patient's D-dimer is elevated.  CT angio and Doppler ultrasound however did not show any evidence of PE.  Troponins were slightly elevated but unchanged.  I doubt ACS.  BNP was also elevated but patient does not  have any evidence of peripheral edema on exam.  No evidence of edema on her chest x-ray however suspect her symptoms could be related to CHF.  Also concerned about the possibility of pulmonary hypertension.  Patient has mentioned feeling short of breath at night which could be related to CHF, she is also at risk for obstructive sleep apnea, obesity hypoventilation syndrome.  I think she would benefit from further workup including echocardiogram.  I have ordered IV diuretics.  I will consult the medical service for admission       Final Clinical Impression(s) / ED Diagnoses Final diagnoses:  Dyspnea, unspecified type  Hypertension, unspecified type  BMI 60.0-69.9, adult (HCC)  Acute on chronic congestive heart failure, unspecified heart failure type (HCC)    Rx / DC Orders ED Discharge Orders          Ordered    furosemide (LASIX) 20 MG tablet  Daily        08/14/22 1526    Ambulatory referral to Cardiology       Comments: If you have not heard from the Cardiology office within the next 72 hours please call [phone].   08/14/22 1526              Linwood Dibbles, MD 08/14/22 1556

## 2022-08-14 NOTE — ED Triage Notes (Signed)
Patient arrives ambulatory by POV carrying fast food c/o shortness of breath, chest heaviness and weight gain over past 2 weeks. Has been seeing her PCP. Reports feeling very full x 2 weeks. Trouble breathing at night. Patient speaking in full sentences.

## 2022-08-14 NOTE — ED Notes (Signed)
   08/14/22 1558  Respiratory Assessment  $ RT Protocol Assessment  Yes  Assessment Type Assess only  Respiratory Pattern Regular  Chest Assessment Chest expansion symmetrical  Bilateral Breath Sounds Clear;Diminished  Oxygen Therapy/Pulse Ox  O2 Device Nasal Cannula  O2 Therapy Oxygen humidified  O2 Flow Rate (L/min) 2 L/min   Multiple desats on room air, while sleeping SpO2 as low as 66% with good pleth.

## 2022-08-14 NOTE — H&P (Signed)
History and Physical    Christine Singleton ZOX:[id number] DOB: 20-Jul-1990 DOA: 08/14/2022  PCP: Patient, No Pcp Per   Patient coming from: Home  Chief Complaint: SOB, orthopnea, PND, wt gain, chest heaviness, leg swelling  HPI: Christine Singleton is a 32 y.o. female with medical history significant for BMI 63 and diagnosis of type 2 diabetes mellitus last month, now presenting to the emergency department with shortness of breath, heavy sensation in her chest, orthopnea, weight gain, and PND over the past 2 weeks.  Patient reports insidious development of shortness of breath and heavy sensation in her chest over the past 2 weeks.  She has also experienced leg swelling, greater on the left side.  Her shortness of breath worsens significantly when she attempts to lay down and she has been waking at night feeling as though she is unable to catch her breath.  [facility] ED Course: Upon arrival to the ED, patient is found to be afebrile with slightly elevated heart rate, stable blood pressure, and oxygen desaturations when sleeping or ambulating.  EKG demonstrates sinus tachycardia with RAD.  Chest x-ray was negative for acute findings.  CTA chest is a limited study but negative for PE.  Left lower extremity venous ultrasound was negative for DVT.  Labs were most notable for BNP 410, D-dimer 2.38, troponin 27 and then 24, WBC 14,600, and platelets 418,000.  Patient was started on supplemental oxygen and in the ED, given 40 mg IV Lasix, and transferred to [facility] for admission.  Review of Systems:  All other systems reviewed and apart from HPI, are negative.  Past Medical History:  Diagnosis Date   Gestational diabetes    With first pregnancy    Past Surgical History:  Procedure Laterality Date   CESAREAN SECTION     CESAREAN SECTION N/A 02/03/2016   Procedure: CESAREAN SECTION;  Surgeon: Lesly Dukes, MD;  Location: [facility] BIRTHING SUITES;  Service: Obstetrics;  Laterality: N/A;    Social  History:   reports that she quit smoking about 7 years ago. She has never used smokeless tobacco. She reports that she does not drink alcohol and does not use drugs.  No Known Allergies  Family History  Problem Relation Age of Onset   Cancer Neg Hx    Diabetes Neg Hx    Hypertension Neg Hx      Prior to Admission medications   Medication Sig Start Date End Date Taking? Authorizing Provider  furosemide (LASIX) 20 MG tablet Take 1 tablet (20 mg total) by mouth daily. 08/14/22  Yes Linwood Dibbles, MD  [facility] ACETAMINOPHEN 325 MG tablet TAKE 2 TABLETS (650 MG TOTAL) BY MOUTH EVERY 4 (FOUR) HOURS AS NEEDED (FOR PAIN SCALE < 4). 02/05/16   [provider]  [facility] SENNA PLUS 8.6-50 MG tablet Take 2 tablets by mouth daily. 02/05/16   [provider]  ferrous sulfate 325 (65 FE) MG tablet TAKE 1 TABLET (325 MG TOTAL) BY MOUTH 2 (TWO) TIMES DAILY. 02/05/16   [provider]  ibuprofen (ADVIL,MOTRIN) 600 MG tablet TAKE 1 TABLET (600 MG TOTAL) BY MOUTH EVERY 6 (SIX) HOURS. 02/05/16   [provider]  metroNIDAZOLE (FLAGYL) 500 MG tablet Take 1 tablet (500 mg total) by mouth 2 (two) times daily. 07/24/16   Levie Heritage, DO  miconazole (MONISTAT 7) 2 % vaginal cream Place 1 Applicatorful vaginally at bedtime. Apply for seven nights 07/24/16   Levie Heritage, DO  oxyCODONE (OXY IR/ROXICODONE) 5 MG immediate release  tablet TAKE 1 TABLET BY MOUTH EVERY 6 HOURS AS NEEDED FOR MODERATE PAIN 02/06/16   [provider]    Physical Exam: Vitals:   08/14/22 1548 08/14/22 1654 08/14/22 1656 08/14/22 1700  BP:  128/86 128/86 106/61  Pulse: 100 (!) 110 (!) 111 (!) 104  Resp: 15 18 (!) 27 20  Temp:  97.9 F (36.6 C)    TempSrc:  Oral    SpO2: (!) 67% 93% 93% 91%  Weight:      Height:        Constitutional: NAD, calm  Eyes: PERTLA, lids and conjunctivae normal ENMT: Mucous membranes are moist. Posterior pharynx clear of any exudate or lesions.   Neck: supple, no  masses  Respiratory: Dyspneic with speech. No wheezing.   Cardiovascular: S1 & S2 heard, regular rate and rhythm. Lt > Rt b/l lower extremity edema.  Abdomen: No distension, no tenderness, soft. Bowel sounds active.  Musculoskeletal: no clubbing / cyanosis. No joint deformity upper and lower extremities.   Skin: no significant rashes, lesions, ulcers. Warm, dry, well-perfused. Neurologic: CN 2-12 grossly intact. Moving all extremities. Alert and oriented.  Psychiatric: Pleasant. Cooperative.    Labs and Imaging on Admission: I have personally reviewed following labs and imaging studies  CBC: Recent Labs  Lab 08/14/22 1158  WBC 14.6*  HGB 11.4*  HCT 37.6  MCV 62.6*  PLT 418*   Basic Metabolic Panel: Recent Labs  Lab 08/14/22 1158  NA 135  K 4.6  CL 99  CO2 24  GLUCOSE 96  BUN 12  CREATININE 0.74  CALCIUM 8.6*   GFR: Estimated Creatinine Clearance: 150.9 mL/min (by C-G formula based on SCr of 0.74 mg/dL). Liver Function Tests: No results for input(s): "AST", "ALT", "ALKPHOS", "BILITOT", "PROT", "ALBUMIN" in the last 168 hours. No results for input(s): "LIPASE", "AMYLASE" in the last 168 hours. No results for input(s): "AMMONIA" in the last 168 hours. Coagulation Profile: No results for input(s): "INR", "PROTIME" in the last 168 hours. Cardiac Enzymes: No results for input(s): "CKTOTAL", "CKMB", "CKMBINDEX", "TROPONINI" in the last 168 hours. BNP (last 3 results) No results for input(s): "PROBNP" in the last 8760 hours. HbA1C: No results for input(s): "HGBA1C" in the last 72 hours. CBG: No results for input(s): "GLUCAP" in the last 168 hours. Lipid Profile: No results for input(s): "CHOL", "HDL", "LDLCALC", "TRIG", "CHOLHDL", "LDLDIRECT" in the last 72 hours. Thyroid Function Tests: No results for input(s): "TSH", "T4TOTAL", "FREET4", "T3FREE", "THYROIDAB" in the last 72 hours. Anemia Panel: No results for input(s): "VITAMINB12", "FOLATE", "FERRITIN", "TIBC",  "IRON", "RETICCTPCT" in the last 72 hours. Urine analysis:    Component Value Date/Time   COLORURINE YELLOW 11/15/2015 1434   APPEARANCEUR HAZY (A) 11/15/2015 1434   LABSPEC >=1.030 11/27/2015 1450   PHURINE 6.5 11/27/2015 1450   GLUCOSEU NEGATIVE 11/27/2015 1450   HGBUR TRACE (A) 11/27/2015 1450   BILIRUBINUR SMALL (A) 11/27/2015 1450   KETONESUR TRACE (A) 11/27/2015 1450   PROTEINUR 100 (A) 11/27/2015 1450   UROBILINOGEN 1.0 11/27/2015 1450   NITRITE NEGATIVE 11/27/2015 1450   LEUKOCYTESUR TRACE (A) 11/27/2015 1450   Sepsis Labs: @LABRCNTIP (procalcitonin:4,lacticidven:4) )No results found for this or any previous visit (from the past 240 hour(s)).   Radiological Exams on Admission: CT Angio Chest PE W and/or Wo Contrast  Result Date: 08/14/2022 CLINICAL DATA:  Pulmonary embolism (PE) suspected, low to intermediate prob, positive D-dimer EXAM: CT ANGIOGRAPHY CHEST WITH CONTRAST TECHNIQUE: Multidetector CT imaging of the chest was performed using the standard  protocol during bolus administration of intravenous contrast. Multiplanar CT image reconstructions and MIPs were obtained to evaluate the vascular anatomy. RADIATION DOSE REDUCTION: This exam was performed according to the departmental dose-optimization program which includes automated exposure control, adjustment of the mA and/or kV according to patient size and/or use of iterative reconstruction technique. CONTRAST:  OMNIPAQUE IOHEXOL 350 MG/ML SOLN COMPARISON:  None Available. FINDINGS: Limitations: Assessment is limited due to poor signal noise ratio. Cardiovascular: Assessment of the segmental pulmonary arteries limited due to poor visualization. Assessment of the lobar pulmonary arteries is also slightly limited. This limitation no evidence of pulmonary embolism. Normal heart size. No pericardial effusion. Mediastinum/Nodes: No enlarged mediastinal, hilar, or axillary lymph nodes. Thyroid gland, trachea, and esophagus  demonstrate no significant findings. Lungs/Pleura: No pleural effusion. No pneumothorax. Linear consolidative opacity in the right middle lobe (series 6, image 132) and in the left perihilar region, favored to represent atelectasis. Upper Abdomen: No acute abnormality. Musculoskeletal: No chest wall abnormality. No acute or significant osseous findings. Review of the MIP images confirms the above findings. IMPRESSION: 1. Assessment of the segmental pulmonary arteries is limited due to poor signal noise ratio. Assessment of the lobar pulmonary arteries is also slightly limited. Within this limitation, no evidence of pulmonary embolism. 2. Linear consolidative opacity in the right middle lobe and in the left perihilar region, favored to represent atelectasis. Electronically Signed   By: Lorenza Cambridge M.D.   On: 08/14/2022 15:09   US Venous Img Lower Unilateral Left  Result Date: 08/14/2022 CLINICAL DATA:  Left leg swelling EXAM: LEFT LOWER EXTREMITY VENOUS DOPPLER ULTRASOUND TECHNIQUE: Gray-scale sonography with graded compression, as well as color Doppler and duplex ultrasound were performed to evaluate the lower extremity deep venous systems from the level of the common femoral vein and including the common femoral, femoral, profunda femoral, popliteal and calf veins including the posterior tibial, peroneal and gastrocnemius veins when visible. The superficial great saphenous vein was also interrogated. Spectral Doppler was utilized to evaluate flow at rest and with distal augmentation maneuvers in the common femoral, femoral and popliteal veins. COMPARISON:  None Available. FINDINGS: Contralateral Common Femoral Vein: Respiratory phasicity is normal and symmetric with the symptomatic side. No evidence of thrombus. Normal compressibility. Common Femoral Vein: No evidence of thrombus. Normal compressibility, respiratory phasicity and response to augmentation. Saphenofemoral Junction: No evidence of thrombus.  Normal compressibility and flow on color Doppler imaging. Profunda Femoral Vein: No evidence of thrombus. Normal compressibility and flow on color Doppler imaging. Femoral Vein: No evidence of thrombus. Normal compressibility, respiratory phasicity and response to augmentation. Popliteal Vein: No evidence of thrombus. Normal compressibility, respiratory phasicity and response to augmentation. Calf Veins: No evidence of thrombus. Normal compressibility and flow on color Doppler imaging. Superficial Great Saphenous Vein: No evidence of thrombus. Normal compressibility. Venous Reflux:  None. Other Findings:  None. IMPRESSION: No evidence of deep venous thrombosis in the left lower extremity. Electronically Signed   By: Jacob Moores M.D.   On: 08/14/2022 13:40   DG Chest 2 View  Result Date: 08/14/2022 CLINICAL DATA:  Shortness of breath EXAM: CHEST - 2 VIEW COMPARISON:  Chest x-ray March 03, 2018 FINDINGS: Limited assessment due to under penetration and overlying soft tissue. Within this limitation, the cardiomediastinal silhouette is unchanged in contour. No focal pulmonary opacity. No pleural effusion or pneumothorax. The visualized upper abdomen is unremarkable. No acute osseous abnormality. IMPRESSION: No active cardiopulmonary disease. Electronically Signed   By: Cordelia Pen.D.  On: 08/14/2022 12:22    EKG: Independently reviewed. Sinus tachycardia, rate 111, RAD.   Assessment/Plan   1. Acute CHF; acute hypoxic respiratory failure  - Continue diuresis with 40 mg IV Lasix q12h, check echocardiogram, monitor weight and I/Os, continue supplemental O2 as needed    2. Type II DM  - A1c was 7.1% in April 2024  - Attempting diet-control at home - Check CBGs and use low-intensity SSI if needed    3. Leukocytosis; thrombocytosis; microcytosis  - Appears stable going back years  - She was recently referred to hematology in [facility]     DVT prophylaxis: Lovenox  Code Status: Full   Level of Care: Level of care: Telemetry Cardiac Family Communication: none present  Disposition Plan:  Patient is from: home  Anticipated d/c is to: Home  Anticipated d/c date is: 08/17/22  Patient currently: Pending improved respiratory and volume status, echocardiogram  Consults called: None  Admission status: Inpatient     Briscoe Deutscher, MD [facility]  08/14/2022, 7:26 PM

## 2022-08-14 NOTE — Discharge Instructions (Addendum)
Start taking the diuretic medication to see if that helps with the swelling.  Follow-up with your cardiologist for further evaluation.  They should call you to schedule an appointment.  Return to the ER for worsening symptoms   Heart Failure Nutrition Therapy  This nutrition therapy will help you feel better and support your heart.  This plan focuses on: Limiting sodium in your diet. Salt (sodium) makes your body hold water. When your body holds too much water, you can feel shortness of breath and swelling. You can prevent these symptoms by eating less salt. Limiting fluid in your diet. For some patients, drinking too much fluid can make heart failure worse. It can cause symptoms such as shortness of breath and swelling. Limiting fluids can help relieve some of your symptoms. Managing your weight. Your registered dietitian nutritionist (RDN) can help you choose a healthy weight for your body type. You can achieve these goals by: Reading food labels to keep track of how much sodium is in the foods you eat. Limiting foods that are high in sodium. Checking your weight to make sure you're not retaining too much fluid. Reading the Food Label: How Much Sodium Is Too Much? The nutrition plan for heart failure usually limits the sodium you get from food and drinks to 2,000 milligrams per day. Salt is the main source of sodium. Read the nutrition label to find out how much sodium is in 1 serving of a food. Select foods with 140 milligrams of sodium or less per serving. Foods with more than 300 milligrams of sodium per serving may not fit into a reduced-sodium meal plan. Check serving sizes. If you eat more than 1 serving, you will get more sodium than the amount listed. Cutting Back on Sodium Avoid processed foods. Eat more fresh foods. Fresh and frozen fruits and vegetables without added juices or sauces are naturally low in sodium. Fresh meats are lower in sodium than processed meats, such as bacon,  sausage, and hot dogs. Read the nutrition label or ask your butcher to help you find a fresh meat that is low in sodium. Eat less salt, at the table and when cooking. Just 1 teaspoon of table salt has 2,300 milligrams of sodium. Leave the salt out of recipes for pasta, casseroles, and soups. Ask your RDN how to cook your favorite recipes without sodium. Be a Engineer, building services. Look for food packages that say "salt-free" or "sodium-free." These items contain less than 5 milligrams of sodium per serving. "Very-low-sodium" products contain less than 35 milligrams of sodium per serving. "Low-sodium" products contain less than 140 milligrams of sodium per serving. "Unsalted" or "no added salt" products may still be high in sodium. Check the nutrition label. Add flavors to your food without adding sodium. Try lemon juice, lime juice, fruit juice, or vinegar. Dry or fresh herbs add flavor. Try basil, bay leaf, dill, rosemary, parsley, sage, dry mustard, nutmeg, thyme, and paprika. Pepper, red pepper flakes, and cayenne pepper can add spice to your meals without adding sodium. Hot sauce contains sodium, but if you use just a drop or two, it will not add up to much. Buy a sodium-free seasoning blend or make your own at home. Use caution when you eat outside your home. Restaurant foods can be very high in sodium. Ask for nutrition information. Many restaurants provide nutrition facts on their menus or websites. Let your server know that you want your food to be cooked without salt. Ask for your salad dressing and  sauces to come "on the side." Fluid Restriction Your doctor may ask you to follow a fluid restriction in addition to taking diuretics (water pills). Ask your doctor how much fluid you can have. Foods that are liquid at room temperature are considered a fluid, such as popsicles, soup, ice cream, and Jell-O. Here are some common conversions that will help you measure your fluid intake every day: 1,000  milliliters = 1 liter or 4 cups 1 fluid ounce = 30 milliliters  1 cup = 240 milliliters 2,000 milliliters = 2 liters or 8 cups  1,500 milliliters = 1 liters or 6 cups    Weight Monitoring Weigh yourself each day. Sudden weight gain is a sign that fluid is building up in your body. Follow these guidelines: Weigh yourself every morning. If you gain 3 or more pounds in 1-2 days or 5 or more pounds within 1 week, call your doctor. Your doctor may adjust your medicine to get rid of the extra fluid. Talk with your doctor or RDN about what a healthy weight is for you. Talk with your doctor to find out what type of physical activity is best for you.  Foods Recommended Food Group Recommended Foods  Grains Bread with less than 80 milligrams sodium per slice (yeast breads usually have less sodium than those made with baking soda) Homemade bread made with reduced-sodium baking soda Many cold cereals, especially shredded wheat and puffed rice Oats, grits, or cream of wheat Dry pastas, noodles, quinoa, and rice  Vegetables Fresh and frozen vegetables without added sauces, salt, or sodium Homemade soups (salt free or low sodium) Low-sodium or sodium-free canned vegetables and soups  Fruits Fresh and canned fruits Dried fruits, such as raisins, cranberries, and prunes  Dairy (Milk and Milk Products) Milk or milk powder Rice milk and soy milk Yogurt, including Greek yogurt Small amounts of natural, block cheese or reduced-sodium cheese (Swiss, ricotta, and fresh mozzarella are lower in sodium than others) Regular or soft cream cheese and low-sodium cottage cheese  Protein Foods (Meat, Poultry, Fish, Armed forces logistics/support/administrative officer) [employer] and fish [place] bacon (except if packaged in a sodium solution) Canned or packed tuna (no more than 4 ounces at 1 serving) Dried beans and peas; edamame (fresh soybeans) Eggs or egg beaters (if  less than 200 mg per serving) Unsalted nuts or peanut butter  Desserts and Snacks Fresh  fruit or applesauce Angel food cake Granola bars Unsalted pretzels, popcorn, or nuts Pudding or gelatin with whipped cream topping Homemade rice-crispy treats Vanilla wafers Frozen fruit bars  Fats Tub or liquid margarine Unsaturated fat oils (canola, olive, corn, sunflower, safflower, peanut)  Condiments Fresh or dried herbs; low-sodium ketchup; vinegar; lemon or lime juice; pepper; salt-free seasoning mixes and marinades (salt-free seasoning blend); simple salad dressings (vinegar and oil); salt-free sauces   Foods Not Recommended Food Group Foods Not Recommended  Grains Breads or crackers topped with salt Cereals (hot/cold) with more than 300 milligrams sodium per serving Biscuits, cornbread, and other "quick" breads prepared with baking soda Prepackaged bread crumbs Self-rising flours  Vegetables Canned vegetables (unless they are salt free or low sodium) Frozen vegetables with seasoning and sauces Sauerkraut and pickled vegetables Canned or dried soups (unless they are salt free or low  sodium) [place] fries and onion rings  Fruits Dried fruits preserved with sodium-containing additives  Dairy (Milk and Milk Products) Buttermilk Processed cheeses  Cottage cheese (unless a low-sodium variety) Feta cheese; shredded cheese (has more sodium than block cheese); "singles" slices  and string cheese  Protein Foods (Meat, Poultry, Fish, Beans) Cured meats: bacon, ham, sausage, pepperoni, and hot dogs Canned meats: chili, Vienna sausage, sardines, and ham Smoked fish and meats Frozen meals that have more than 600 milligrams sodium  Fats Salted butter or margarine  Condiments Salt, sea salt, kosher salt, onion salt, and garlic salt Seasoning mixes containing salt (Lemon Pepper or Bouillon cubes) Catsup or ketchup, BBQ sauce, Worcestershire and soy sauce Salsa, pickles, olives, relish Salad dressings: ranch, blue cheese, [place], and [place]   Alcohol Check with your doctor.   Heart  Failure Sample 1-Day Menu View Nutrient Info Breakfast 1 cup regular oatmeal made with water or milk 1 cup reduced-fat (2%) milk 1 medium banana 1 slice whole wheat bread 1 tablespoon salt-free peanut butter  Morning Snack 1/2 cup dried cranberries  Lunch 3 ounces grilled chicken breast 1 cup salad greens Olive oil and vinegar dressing (for greens) 5 unsalted or low-sodium crackers Fruit plate with 1/4 cup strawberries 1/2 sliced orange (for fruit plate) 1 peach half (for fruit plate)  Afternoon Snack 1 ounce low-sodium [place] 1 piece whole wheat bread  Evening Meal 3 ounces herb-baked fish 1 baked potato 2 teaspoons soft margarine (trans fat-free) (for potato) Sliced tomatoes 1/2 cup steamed spinach drizzled with lemon juice 3-inch square of angel food cake Fresh strawberries (2) (for cake)  Evening Snack 2 tablespoons salt-free peanut butter 5 low-sodium crackers  Daily Sum Nutrient Unit Value  Macronutrients  Energy kcal 1890  Energy kJ 7906  Protein g 95  Total lipid (fat) g 56  Carbohydrate, by difference g 270  Fiber, total dietary g 31  Sugars, total g 99  Minerals  Calcium, Ca mg 949  Iron, Fe mg 27  Sodium, Na mg 1538  Vitamins  Vitamin C, total ascorbic acid mg 118  Vitamin A, IU IU 18639  Vitamin D IU 232  Lipids  Fatty acids, total saturated g 13  Fatty acids, total monounsaturated g 22  Fatty acids, total polyunsaturated g 16  Cholesterol mg 126     Heart Failure Vegan Sample 1-Day Menu View Nutrient Info Breakfast 1 cup oatmeal  cup walnuts 1 banana 1 cup soymilk fortified with calcium, vitamin B12, and vitamin D  Lunch 1 large whole wheat pita Salad made with: 1 cup chickpeas 1 cup lettuce  cup cherry tomatoes 1 cup strawberries 1 tablespoon olive oil 1 tablespoon balsamic vinegar  Evening Meal  cup tofu 2 teaspoons olive oil Pinch garlic powder 1 baked potato 1 tablespoon margarine, soft, tub  cup cooked spinach with: Squeeze of  lemon 1 cup soymilk fortified with calcium, vitamin B12, and vitamin D  Evening Snack 1 tablespoon peanut butter, without salt  ounce pretzels, without salt  Daily Sum Nutrient Unit Value  Macronutrients  Energy kcal 1848  Energy kJ 7735  Protein g 74  Total lipid (fat) g 83  Carbohydrate, by difference g 223  Fiber, total dietary g 39  Sugars, total g 44  Minerals  Calcium, Ca mg 1325  Iron, Fe mg 20  Sodium, Na mg 1098  Vitamins  Vitamin C, total ascorbic acid mg 140  Vitamin A, IU IU 15807  Vitamin D IU 238  Lipids  Fatty acids, total saturated g 13  Fatty acids, total monounsaturated g 33  Fatty acids, total polyunsaturated g 32  Cholesterol mg 0     Heart Failure Vegetarian (Lacto-Ovo) Sample 1-Day Menu View Nutrient Info Breakfast 1 cup oatmeal  cup walnuts  1 banana 1 cup fat-free milk  Lunch 1 large whole wheat pita Salad made with:  cup chickpeas 1 ounce mozzarella cheese 1 cup lettuce  cup cherry tomatoes 1 cup strawberries 1 tablespoon olive oil 1 tablespoon balsamic vinegar  Evening Meal  cup tofu 2 teaspoons olive oil Pinch garlic powder 1 baked potato 1 tablespoon margarine, soft, tub  cup cooked spinach with: Squeeze of lemon 1 cup fat-free milk  Evening Snack 1 tablespoon peanut butter, without salt 1 apple  Daily Sum Nutrient Unit Value  Macronutrients  Energy kcal 1847  Energy kJ 7734  Protein g 76  Total lipid (fat) g 79  Carbohydrate, by difference g 231  Fiber, total dietary g 35  Sugars, total g 83  Minerals  Calcium, Ca mg 1488  Iron, Fe mg 16  Sodium, Na mg 1100  Vitamins  Vitamin C, total ascorbic acid mg 149  Vitamin A, IU IU 16116  Vitamin D IU 234  Lipids  Fatty acids, total saturated g 15  Fatty acids, total monounsaturated g 32  Fatty acids, total polyunsaturated g 26  Cholesterol mg 28    Copyright 2020  Academy of [employer]etics. All rights reserved

## 2022-08-14 NOTE — ED Notes (Signed)
Patient transported to X-ray 

## 2022-08-14 NOTE — Progress Notes (Signed)
Plan of Care Note for accepted transfer  Patient: Christine Singleton    OZH:[id number]  DOA: 08/14/2022     Nursing staff, Please call [facility] Admits & Consults System-Wide number on Amion as soon as patient's arrival to the unit (not the listed attending) so that the appropriate admitting provider can evaluate the pt. ASAP to avoid any delay in care. Facility requesting transfer: [facility] Requesting Provider: Rondel Baton, MD  Reason for transfer: Admission Facility course: Patient PMH of morbid obesity, BMI 62, presents with complaints of shortness of breath and chest heaviness ongoing for last [redacted] weeks along with orthopnea and PND.  Also reported history of swelling of her legs. Workup in the ED showed elevated BNP, D-dimer as well as troponin.  Troponin elevation not consistent with ACS pattern.  EKG shows no evidence of acute ischemia or pericarditis. Lower extremity Doppler negative for DVT. CT scan of the chest not optimal study but negative for pulmonary embolism or pneumonia. Patient hypoxic with saturation dropping to 86% and still symptomatic with shortness of breath.  Currently chest pain-free. EDP requesting admission for further workup.  Plan of care: The patient is accepted for admission to Telemetry unit, at [facility]. Patient will require echocardiogram with Definity contrast on arrival. Has leukocytosis will require further workup.  Has microcytic anemia will require further workup.  May require outpatient sleep study. May require outpatient oxygen therapy.  Author: Lynden Oxford, MD  08/14/2022  Check [email or web address] for on-call coverage.

## 2022-08-14 NOTE — ED Notes (Signed)
Patient noted to have SpO2 drop down and maintain at 78% while asleep. This RN went in to review discharge paperwork and had patient take a few deep breaths in attempts to bring up SpO2. SpO2 dropped down to 58% immediately after waking up, improved to 94% a few minutes later with deep breathing.   Ambulated patient with pulse ox around department, went approximately 40 feet and became very short of breath, SpO2 87%, had to sit down to catch breath. Provider notified, admitting patient to hospital due to hypoxia.

## 2022-08-14 NOTE — Progress Notes (Addendum)
Patient showered on arrival to unit delaying vital signs and monitoring; patient on monitor at this time with vital signs taken. Admission service paged. Patient complains of ongoing chest pain identifying it as "the same" as the pain felt at [facility], worsening with exertion and relieved by rest.  Addendum: relief reported by patient with rest.  Patient experiencing episodes of "jerking awake in a panic". Periods of apnea observed by RN throughout the night with correlated drop in O2 sat. Encouraged compliance with supplemental oxygen. Bedside commode positioned next to bed to minimize exertion with diuresis.

## 2022-08-15 ENCOUNTER — Inpatient Hospital Stay (HOSPITAL_COMMUNITY): Payer: Medicaid Other

## 2022-08-15 DIAGNOSIS — I5033 Acute on chronic diastolic (congestive) heart failure: Secondary | ICD-10-CM

## 2022-08-15 DIAGNOSIS — Z6841 Body Mass Index (BMI) 40.0 and over, adult: Secondary | ICD-10-CM

## 2022-08-15 DIAGNOSIS — I509 Heart failure, unspecified: Secondary | ICD-10-CM | POA: Diagnosis not present

## 2022-08-15 DIAGNOSIS — E119 Type 2 diabetes mellitus without complications: Secondary | ICD-10-CM | POA: Diagnosis not present

## 2022-08-15 DIAGNOSIS — J9601 Acute respiratory failure with hypoxia: Secondary | ICD-10-CM | POA: Diagnosis not present

## 2022-08-15 LAB — ECHOCARDIOGRAM COMPLETE
AR max vel: 1.92 cm2
AV Area VTI: 2.3 cm2
AV Area mean vel: 1.83 cm2
AV Mean grad: 4 mmHg
AV Peak grad: 7.4 mmHg
Ao pk vel: 1.36 m/s
Area-P 1/2: 4.08 cm2
Height: 62 in
S' Lateral: 3.3 cm
Weight: 5721.38 oz

## 2022-08-15 LAB — CBC
HCT: 35.8 % — ABNORMAL LOW (ref 36.0–46.0)
Hemoglobin: 10.8 g/dL — ABNORMAL LOW (ref 12.0–15.0)
MCH: 19.3 pg — ABNORMAL LOW (ref 26.0–34.0)
MCHC: 30.2 g/dL (ref 30.0–36.0)
MCV: 63.9 fL — ABNORMAL LOW (ref 80.0–100.0)
Platelets: 433 10*3/uL — ABNORMAL HIGH (ref 150–400)
RBC: 5.6 MIL/uL — ABNORMAL HIGH (ref 3.87–5.11)
RDW: 21.8 % — ABNORMAL HIGH (ref 11.5–15.5)
WBC: 15 10*3/uL — ABNORMAL HIGH (ref 4.0–10.5)
nRBC: 0.2 % (ref 0.0–0.2)

## 2022-08-15 LAB — BASIC METABOLIC PANEL
Anion gap: 15 (ref 5–15)
BUN: 9 mg/dL (ref 6–20)
CO2: 30 mmol/L (ref 22–32)
Calcium: 8.9 mg/dL (ref 8.9–10.3)
Chloride: 92 mmol/L — ABNORMAL LOW (ref 98–111)
Creatinine, Ser: 0.74 mg/dL (ref 0.44–1.00)
GFR, Estimated: >60 mL/min (ref >60)
Glucose, Bld: 106 mg/dL — ABNORMAL HIGH (ref 70–99)
Potassium: 3.9 mmol/L (ref 3.5–5.1)
Sodium: 137 mmol/L (ref 135–145)

## 2022-08-15 LAB — HIV ANTIBODY (ROUTINE TESTING W REFLEX): HIV Screen 4th Generation wRfx: NONREACTIVE

## 2022-08-15 LAB — GLUCOSE, CAPILLARY
Glucose-Capillary: 118 mg/dL — ABNORMAL HIGH (ref 70–99)
Glucose-Capillary: 87 mg/dL (ref 70–99)
Glucose-Capillary: 136 mg/dL — ABNORMAL HIGH (ref 70–99)
Glucose-Capillary: 98 mg/dL (ref 70–99)

## 2022-08-15 LAB — BLOOD GAS, ARTERIAL
Acid-Base Excess: 14 mmol/L — ABNORMAL HIGH (ref 0.0–2.0)
Bicarbonate: 41.8 mmol/L — ABNORMAL HIGH (ref 20.0–28.0)
O2 Saturation: 98.9 %
Patient temperature: 37
pCO2 arterial: 66 mmHg (ref 32–48)
pH, Arterial: 7.41 (ref 7.35–7.45)
pO2, Arterial: 103 mmHg (ref 83–108)

## 2022-08-15 LAB — MAGNESIUM: Magnesium: 1.8 mg/dL (ref 1.7–2.4)

## 2022-08-15 MED ORDER — PERFLUTREN LIPID MICROSPHERE
1.0000 mL | INTRAVENOUS | Status: AC | PRN
  Administered 2022-08-15: 2 mL via INTRAVENOUS

## 2022-08-15 NOTE — Progress Notes (Signed)
[facility]  PROGRESS NOTE  Christine Singleton ZOX:[id number] DOB: [birth year] DOA: 08/14/2022 PCP: Patient, No Pcp Per   Brief HPI:   32 yr old female with history of morbid obesity with BMI 63 kg/m, diabetes mellitus type 2 presented with shortness of breath, heavy chest sensation, orthopnea, weight gain, PND for past 2 weeks.  In the ED CT chest was negative for PE.  Left lower extremity venous duplex was negative for DVT.  BNP 410, D-dimer 2.38.  WBC 14,600. 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 -Will continue to monitor -BiPAP as needed ordered  Diabetes mellitus type 2 -Hemoglobin A1c was 7.1 in April 2024 -She is attempting diet control for diabetes at home -CBG well-controlled -Continue very sensitive sliding scale insulin with NovoLog    Medications     enoxaparin (LOVENOX) injection  80 mg Subcutaneous Q24H   furosemide  40 mg Intravenous Q12H   insulin aspart  0-5 Units Subcutaneous QHS   insulin aspart  0-6 Units Subcutaneous TID WC   sodium chloride flush  3 mL Intravenous Q12H     Data Reviewed:   CBG:  Recent Labs  Lab 08/14/22 2132 08/15/22 0651 08/15/22 1130  GLUCAP 191* 118* 87    SpO2: 100 % O2 Flow Rate (L/min): 2 L/min    Vitals:   08/14/22 2320 08/15/22 0510 08/15/22 0817 08/15/22 1043  BP: 132/81 115/71 120/69 132/89  Pulse: (!) 109 (!) 114 (!) 112 (!) 106  Resp: 18 10 19 12   Temp: 98.3 F (36.8 C) 98.3 F (36.8 C) 98.6 F (37 C) 98 F (36.7 C)  TempSrc: Oral Oral Oral Oral  SpO2: 95% 92% 92% 100%  Weight:  (!) 162.2 kg    Height:          Data Reviewed:  Basic Metabolic  Panel: Recent Labs  Lab 08/14/22 1158 08/15/22 0707  NA 135 137  K 4.6 3.9  CL 99 92*  CO2 24 30  GLUCOSE 96 106*  BUN 12 9  CREATININE 0.74 0.74  CALCIUM 8.6* 8.9  MG  --  1.8    CBC: Recent Labs  Lab 08/14/22 1158 08/15/22 0707  WBC 14.6* 15.0*  HGB 11.4* 10.8*  HCT 37.6 35.8*  MCV 62.6* 63.9*  PLT 418* 433*    LFT No results for input(s): "AST", "ALT", "ALKPHOS", "BILITOT", "PROT", "ALBUMIN" in the last 168 hours.   Antibiotics: Anti-infectives (From admission, onward)    None        DVT prophylaxis: Lovenox  Code Status: Full code  Family Communication: No family at bedside   CONSULTS    Subjective   This morning patient was somnolent, difficult to arouse.  ABG showed compensated respiratory acidosis with metabolic alkalosis.  Later patient became more alert and communicating.  Denies chest pain, feels better this morning.   Objective    Physical Examination:  General-appears in no acute distress Heart-S1-S2, regular, no murmur auscultated Lungs-clear to auscultation bilaterally, no wheezing or crackles auscultated Abdomen-soft, nontender, no organomegaly Extremities-no edema in the lower extremities Neuro-alert, oriented x3, no focal deficit noted   Status is: Inpatient:  Meredeth Ide   [facility] If 7PM-7AM, please contact night-coverage at [email or web address], Office  [phone]   08/15/2022, 1:38 PM  LOS: 1 day

## 2022-08-15 NOTE — Progress Notes (Signed)
   08/15/22 0510  Assess: MEWS Score  Temp 98.3 F (36.8 C)  BP 115/71  MAP (mmHg) 83  Pulse Rate (!) 114  ECG Heart Rate (!) 112  Resp 10  SpO2 92 %  Assess: MEWS Score  MEWS Temp 0  MEWS Systolic 0  MEWS Pulse 2  MEWS RR 1  MEWS LOC 0  MEWS Score 3  MEWS Score Color Yellow  Assess: if the MEWS score is Yellow or Red  Were vital signs taken at a resting state? No  Focused Assessment No change from prior assessment  Does the patient meet 2 or more of the SIRS criteria? No  MEWS guidelines implemented  No, other (Comment)  Assess: SIRS CRITERIA  SIRS Temperature  0  SIRS Pulse 1  SIRS Respirations  0  SIRS WBC 0  SIRS Score Sum  1   Patient under near constant exertion; patient being actively diuresed, vitals therefore not taken at rest.

## 2022-08-15 NOTE — Progress Notes (Signed)
  Echocardiogram 2D Echocardiogram has been performed.  Christine Singleton Banker 08/15/2022, 2:19 PM

## 2022-08-16 DIAGNOSIS — E119 Type 2 diabetes mellitus without complications: Secondary | ICD-10-CM | POA: Diagnosis not present

## 2022-08-16 DIAGNOSIS — E66813 Obesity, class 3: Secondary | ICD-10-CM

## 2022-08-16 DIAGNOSIS — I5033 Acute on chronic diastolic (congestive) heart failure: Secondary | ICD-10-CM

## 2022-08-16 DIAGNOSIS — D75839 Thrombocytosis, unspecified: Secondary | ICD-10-CM | POA: Diagnosis not present

## 2022-08-16 LAB — BASIC METABOLIC PANEL
Anion gap: 12 (ref 5–15)
BUN: 11 mg/dL (ref 6–20)
CO2: 32 mmol/L (ref 22–32)
Calcium: 9 mg/dL (ref 8.9–10.3)
Chloride: 93 mmol/L — ABNORMAL LOW (ref 98–111)
Creatinine, Ser: 0.73 mg/dL (ref 0.44–1.00)
GFR, Estimated: >60 mL/min (ref >60)
Glucose, Bld: 141 mg/dL — ABNORMAL HIGH (ref 70–99)
Potassium: 4 mmol/L (ref 3.5–5.1)
Sodium: 137 mmol/L (ref 135–145)

## 2022-08-16 LAB — CBC
HCT: 38.9 % (ref 36.0–46.0)
Hemoglobin: 11.6 g/dL — ABNORMAL LOW (ref 12.0–15.0)
MCH: 18.9 pg — ABNORMAL LOW (ref 26.0–34.0)
MCHC: 29.8 g/dL — ABNORMAL LOW (ref 30.0–36.0)
MCV: 63.5 fL — ABNORMAL LOW (ref 80.0–100.0)
Platelets: 437 10*3/uL — ABNORMAL HIGH (ref 150–400)
RBC: 6.13 MIL/uL — ABNORMAL HIGH (ref 3.87–5.11)
RDW: 21.7 % — ABNORMAL HIGH (ref 11.5–15.5)
WBC: 17.2 10*3/uL — ABNORMAL HIGH (ref 4.0–10.5)
nRBC: 0 % (ref 0.0–0.2)

## 2022-08-16 LAB — GLUCOSE, CAPILLARY
Glucose-Capillary: 136 mg/dL — ABNORMAL HIGH (ref 70–99)
Glucose-Capillary: 64 mg/dL — ABNORMAL LOW (ref 70–99)
Glucose-Capillary: 86 mg/dL (ref 70–99)

## 2022-08-16 LAB — MAGNESIUM: Magnesium: 1.8 mg/dL (ref 1.7–2.4)

## 2022-08-16 MED ORDER — FUROSEMIDE 10 MG/ML IJ SOLN
60.0000 mg | Freq: Two times a day (BID) | INTRAMUSCULAR | Status: DC
  Administered 2022-08-16 – 2022-08-19 (×6): 60 mg via INTRAVENOUS
  Filled 2022-08-16 (×6): qty 6

## 2022-08-16 MED ORDER — SPIRONOLACTONE 12.5 MG HALF TABLET
12.5000 mg | ORAL_TABLET | Freq: Every day | ORAL | Status: DC
  Administered 2022-08-16 – 2022-08-18 (×2): 12.5 mg via ORAL
  Filled 2022-08-16 (×4): qty 1

## 2022-08-16 MED ORDER — EMPAGLIFLOZIN 10 MG PO TABS
10.0000 mg | ORAL_TABLET | Freq: Every day | ORAL | Status: DC
  Administered 2022-08-16 – 2022-08-19 (×3): 10 mg via ORAL
  Filled 2022-08-16 (×4): qty 1

## 2022-08-16 NOTE — Assessment & Plan Note (Addendum)
Calculated BMI is 64  Possible OSA, continue Cpap at night and will need outpatient sleep study.

## 2022-08-16 NOTE — Progress Notes (Signed)
Hypoglycemic Event  CBG: 1127-64  Treatment: 8 oz juice/soda  Symptoms: Nervous/irritable  Follow-up CBG: Time:1248 CBG Result:86  Possible Reasons for Event: Inadequate meal intake  Comments/MD notified:MD notified    Erick Blinks

## 2022-08-16 NOTE — Hospital Course (Addendum)
Christine Singleton was admitted to the hospital with the working diagnosis of heart failure exacerbation.   32 yo female with the past medical history of T2DM, and obesity class 3 who presented with dyspnea. She endorsed worsening dyspnea for the last 2 weeks, with edema of her lower extremities, PND and orthopnea. On her initial physical examination her blood pressure was 128/86, HR 111, RR 27 and 02 saturation 67%, lungs with no wheezing, heart with S1 and S2 present and rhythmic, abdomen with no distention and positive lower extremity edema.   Na 130, K 4,6 Cl 99, bicarbonate 24, glucose 96 bun 12 cr 0,74  BNP 409 Wbc 14.6 hgb 11.4 plt 418  D dimer 2,38  High sensitive troponin 27 and 24   Chest radiograph with cardiomegaly, bilateral hilar vascular congestion with no effusions or infiltrates.   CT chest with faint bilateral ground glass opacities, no evidence of pulmonary embolism (limited evaluation due to poor signal noise ratio).  EKG 111 bpm, normal axis, normal intervals, sinus rhythm with no significant ST segment changes, negative T wave lead III, AvF, V3 and V4.   Patient was placed on furosemide for diuresis.   06/03 volume status is improving. Continue diuresis.  06/04 patient with clinically euvolemic. Plan for follow up as outpatient.

## 2022-08-16 NOTE — Progress Notes (Signed)
   08/15/22 2300  BiPAP/CPAP/SIPAP  Reason BIPAP/CPAP not in use Other(comment) (pt resting on 2l  with no distress. PT said will let RT know  when she  wants to wear it. Bipap on standby. will continue to monitor.)

## 2022-08-16 NOTE — Assessment & Plan Note (Addendum)
Echocardiogram with mild reduction in systolic function 45 to 50%, entire inferior wall and posterior wall are hypokinetic. RV with preserved systolic function. RVSP 28,8 no significant valvular disease.  Flattening of interventricular septum on diastole.   Patient was placed on IV furosemide for diuresis, negative fluid balance was achieved, with significant improvement in her symptoms. She lost 9 kg during this hospitalization.   Patient will continue heart failure management with spironolactone and SGLT 2 inh Hold on B blocker and ARB for now, possible addition as outpatient.  As needed furosemide for signs of volume overload.   Echocardiogram wall motion abnormalities, likely related to hypertension and pulmonary hypertension, related to obesity. Low pre test probability for ischemic disease.  Findings discussed with Cardiology and agreement with continue aggressive medical therapy and life style modifications.  Follow up as outpatient.

## 2022-08-16 NOTE — TOC Initial Note (Signed)
Transition of Care [facility]) - Initial/Assessment Note    Patient Details  Name: Christine Singleton MRN: [id number] Date of Birth: Apr 08, 1990  [facility]) CM/SW Contact:    Leone Haven, RN Phone Number: 08/16/2022, 7:06 AM  Clinical Narrative:                 From home, presents with Acute CHF, has insurance on file, she has seen Shea Stakes with Family Medicine at [facility] which Is noted in the notes.          Patient Goals and CMS Choice            Expected Discharge Plan and Services                                              Prior Living Arrangements/Services                       Activities of Daily Living      Permission Sought/Granted                  Emotional Assessment              Admission diagnosis:  Acute CHF (congestive heart failure) (HCC) [I50.9] BMI 60.0-69.9, adult (HCC) [Z68.44] Dyspnea, unspecified type [R06.00] Hypertension, unspecified type [I10] Acute on chronic congestive heart failure, unspecified heart failure type [facility]) [I50.9] Patient Active Problem List   Diagnosis Date Noted   Acute CHF (congestive heart failure) (HCC) 08/14/2022   Acute respiratory failure with hypoxia (HCC) 08/14/2022   Non-insulin dependent type 2 diabetes mellitus (HCC) 08/14/2022   Leukocytosis 08/14/2022   Thrombocytosis 08/14/2022   History of cesarean delivery 04/23/2016   Delivery by elective cesarean section 02/03/2016   PCP:  Patient, No Pcp Per Pharmacy:   [id number] [place], [place] - [address] [place] [address] Phone: [phone] Fax: [phone]  [id number] [place], [place] - [address] [place] [address] Phone: [phone] Fax: [phone]  [facility] [facility] [address], [address] [place] [address] Phone: [phone] Fax:  [phone]     Social Determinants of Health (SDOH) Social History: SDOH Screenings   Tobacco Use: Medium Risk (08/14/2022)   SDOH Interventions:     Readmission Risk Interventions     No data to display

## 2022-08-16 NOTE — Progress Notes (Addendum)
Progress Note   Patient: Christine Singleton ZOX:[id number] DOB: [birth year] DOA: 08/14/2022     2 DOS: the patient was seen and examined on 08/16/2022   Brief hospital course: Christine Singleton was admitted to the hospital with the working diagnosis of heart failure exacerbation.   32 yo female with the past medical history of T2DM, and obesity class 3 who presented with dyspnea. She endorsed worsening dyspnea for the last 2 weeks, with edema of her lower extremities, PND and orthopnea. On her initial physical examination her blood pressure was 128/86, HR 111, RR 27 and 02 saturation 67%, lungs with no wheezing, heart with S1 and S2 present and rhythmic, abdomen with no distention and positive lower extremity edema.   Na 130, K 4,6 Cl 99, bicarbonate 24, glucose 96 bun 12 cr 0,74  BNP 409 Wbc 14.6 hgb 11.4 plt 418  D dimer 2,38   Chest radiograph with cardiomegaly, bilateral hilar vascular congestion with no effusions or infiltrates.   CT chest with faint bilateral ground glass opacities, no evidence of pulmonary embolism (limited evaluation due to poor signal noise ratio).  EKG 111 bpm, normal axis, normal intervals, sinus rhythm with no significant ST segment changes, negative T wave lead III, AvF, V3 and V4.   Patient was placed on furosemide for diuresis.     Assessment and Plan: * Acute on chronic diastolic CHF (congestive heart failure) (HCC) Echocardiogram with mild reduction in systolic function 45 to 50%, entire inferior wall and posterior wall are hypokinetic. RV with preserved systolic function. RVSP 28,8 no significant valvular disease.   Documented urine output is 775 ml Systolic blood pressure 153 to 122 mmHg.   Plan to increase furosemide to 60 mg IV q12 hrs Add SGLT 2 inh and spironolactone.  Follow up response to diuresis.   Doubt ischemic cardiomyopathy, but considering wall motion abnormalities, mild troponin elevation and T wave inversions may need further work up, will  check with cardiology.  EKG today.   Non-insulin dependent type 2 diabetes mellitus (HCC) Fasting glucose is 141 Check Hgb A1c  Capillary glucose has been 136, 64 and 86. Will discontinue insulin therapy.   Class 3 obesity (HCC) Calculated BMI is 64  Possible OSA, continue Cpap at night and will need outpatient sleep study.   Thrombocytosis Leukocytosis  Chronic elevation of wbc and plt Will check differential in am.         Subjective: Patient continue to have dyspnea, feeling tightness in her legs. Positive headache but not chest pain. She has not been out of bed yet.   Physical Exam: Vitals:   08/16/22 0442 08/16/22 0500 08/16/22 0727 08/16/22 1100  BP: 138/74  (!) 153/129 122/80  Pulse: (!) 110  (!) 117 (!) 106  Resp: 14  20 17   Temp: 97.8 F (36.6 C)  98.1 F (36.7 C) 99.1 F (37.3 C)  TempSrc: Oral  Oral Axillary  SpO2: 99%  90% 96%  Weight:  (!) 158.8 kg    Height:       Neurology awake and alert ENT with no pallor Cardiovascular with S1 and S2 present and rhythmic, tachycardic, no gallops, rubs or murmurs No mild JVD (wide neck) Respiratory with no rales or wheezing, no ronchi Abdomen with no distention  No lower extremity edema  Data Reviewed:    Family Communication: no family at the bedside   Disposition: Status is: Inpatient Remains inpatient appropriate because: heart failure   Planned Discharge Destination: Home    Author: Delrae Sawyers  Annett Gula, MD 08/16/2022 1:08 PM  For on call review [email or web address].

## 2022-08-16 NOTE — Assessment & Plan Note (Addendum)
Uncontrolled T2Dm with Hgb A1c of 7.3  Add Metformin and encourage life style modifications.

## 2022-08-16 NOTE — Assessment & Plan Note (Signed)
Leukocytosis and thrombocytosis.  Serum iron 25, TIBC 554, transferrin saturation 5 and ferritin 20, transferrin 384, consistent with iron deficiency anemia.   Wbc is 15 down from 17, with 10,7 PMN and 1,2 Monocytes.  Plan to give on dose of IV iron and follow up cell count and iron stores as outpatient.

## 2022-08-17 ENCOUNTER — Other Ambulatory Visit: Payer: Self-pay

## 2022-08-17 DIAGNOSIS — D75839 Thrombocytosis, unspecified: Secondary | ICD-10-CM | POA: Diagnosis not present

## 2022-08-17 DIAGNOSIS — I5033 Acute on chronic diastolic (congestive) heart failure: Secondary | ICD-10-CM | POA: Diagnosis not present

## 2022-08-17 DIAGNOSIS — E119 Type 2 diabetes mellitus without complications: Secondary | ICD-10-CM | POA: Diagnosis not present

## 2022-08-17 LAB — BASIC METABOLIC PANEL
Anion gap: 9 (ref 5–15)
BUN: 11 mg/dL (ref 6–20)
CO2: 35 mmol/L — ABNORMAL HIGH (ref 22–32)
Calcium: 9.1 mg/dL (ref 8.9–10.3)
Chloride: 91 mmol/L — ABNORMAL LOW (ref 98–111)
Creatinine, Ser: 0.85 mg/dL (ref 0.44–1.00)
GFR, Estimated: >60 mL/min (ref >60)
Glucose, Bld: 111 mg/dL — ABNORMAL HIGH (ref 70–99)
Potassium: 4.3 mmol/L (ref 3.5–5.1)
Sodium: 135 mmol/L (ref 135–145)

## 2022-08-17 LAB — MAGNESIUM: Magnesium: 1.9 mg/dL (ref 1.7–2.4)

## 2022-08-17 LAB — GLUCOSE, CAPILLARY: Glucose-Capillary: 116 mg/dL — ABNORMAL HIGH (ref 70–99)

## 2022-08-17 NOTE — Progress Notes (Signed)
Progress Note   Patient: Sinclaire Kosin ZOX:[id number] DOB: 12-05-1990 DOA: 08/14/2022     3 DOS: the patient was seen and examined on 08/17/2022   Brief hospital course: Mrs. Frigo was admitted to the hospital with the working diagnosis of heart failure exacerbation.   32 yo female with the past medical history of T2DM, and obesity class 3 who presented with dyspnea. She endorsed worsening dyspnea for the last 2 weeks, with edema of her lower extremities, PND and orthopnea. On her initial physical examination her blood pressure was 128/86, HR 111, RR 27 and 02 saturation 67%, lungs with no wheezing, heart with S1 and S2 present and rhythmic, abdomen with no distention and positive lower extremity edema.   Na 130, K 4,6 Cl 99, bicarbonate 24, glucose 96 bun 12 cr 0,74  BNP 409 Wbc 14.6 hgb 11.4 plt 418  D dimer 2,38   Chest radiograph with cardiomegaly, bilateral hilar vascular congestion with no effusions or infiltrates.   CT chest with faint bilateral ground glass opacities, no evidence of pulmonary embolism (limited evaluation due to poor signal noise ratio).  EKG 111 bpm, normal axis, normal intervals, sinus rhythm with no significant ST segment changes, negative T wave lead III, AvF, V3 and V4.   Patient was placed on furosemide for diuresis.     Assessment and Plan: * Acute on chronic diastolic CHF (congestive heart failure) (HCC) Echocardiogram with mild reduction in systolic function 45 to 50%, entire inferior wall and posterior wall are hypokinetic. RV with preserved systolic function. RVSP 28,8 no significant valvular disease.  Flattening of interventricular septum on diastole.   Documented urine output is 600 ml. Likely not accurate.  She has lost 7 kg since admission.  Systolic blood pressure 143 to 118 mmHg.   Continue with furosemide to 60 mg IV q12 hrs Continue with SGLT 2 inh and spironolactone.   Doubt ischemic cardiomyopathy, but considering wall motion  abnormalities, mild troponin elevation and T wave inversions may need further work up, will check with cardiology.  EKG with persistent T wave inversions with no changes.   Non-insulin dependent type 2 diabetes mellitus (HCC) Fasting glucose is 111 Check Hgb A1c    Class 3 obesity (HCC) Calculated BMI is 64  Possible OSA, continue Cpap at night and will need outpatient sleep study.   Thrombocytosis Leukocytosis  Chronic elevation of wbc and plt Will check differential in am.         Subjective: patient had indigestion this am that improved with antiacid therapy, no chest pain, dyspnea continue to improve.   Physical Exam: Vitals:   08/17/22 0038 08/17/22 0405 08/17/22 0616 08/17/22 0951  BP: (!) 141/88 (!) 143/97    Pulse: 93 93    Resp: 16 11    Temp: 98.4 F (36.9 C) 98.4 F (36.9 C)  98.1 F (36.7 C)  TempSrc: Oral Oral  Oral  SpO2: 99% 97%    Weight:   (!) 155.6 kg   Height:       Neurology awake and alert ENT with no pallor Cardiovascular with S1 and S2 present and rhythmic with no gallops, rubs or murmurs No JVD No lower extremity edema Respiratory with no rales or wheezing, no rhonchi Abdomen protuberant with no distention   Data Reviewed:    Family Communication: no family at the bedside   Disposition: Status is: Inpatient Remains inpatient appropriate because: heart failure with IV furosemide   Planned Discharge Destination: Home     Author:  Embry Huss Annett Gula, MD 08/17/2022 12:06 PM  For on call review [email or web address].

## 2022-08-17 NOTE — Progress Notes (Signed)
Patient ID: Christine Singleton, female   DOB: Apr 11, 1990, 32 y.o.   MRN: [id number]  Vomiting episode x 2. Compazine given. Patient instructed not to eat or drink and stay in an upright position. Will continue to monitor.  Lidia Collum, RN

## 2022-08-17 NOTE — Progress Notes (Signed)
Initial Nutrition Assessment  DOCUMENTATION CODES:   Morbid obesity  INTERVENTION:  - Attach diet handout to discharge paperwork.   NUTRITION DIAGNOSIS:   Inadequate oral intake related to nausea, vomiting as evidenced by meal completion < 50%.  GOAL:   Patient will meet greater than or equal to 90% of their needs  MONITOR:   PO intake  REASON FOR ASSESSMENT:   Consult Assessment of nutrition requirement/status, Diet education  ASSESSMENT:   32 y.o. female admits related to SOB, orthopnea, PND, chest heaviness and leg swelling. PMH includes: gestational DM. Pt is currently receiving medical management related to acute on chronic diastolic CHF.  Meds reviewed: lasix, aldactone. Labs reviewed: WDL.   MD consult for diet education and assessment of nutrition status. RD attempted to call pt's room but no answer. Per RN note from this am, pt had 2 episodes of vomiting this am. No wt loss per record. RD will continue to monitor for diet advancement. RD will attach diet handouts to discharge paperwork and attempt diet education at f/u. Will continue to monitor PO intakes.   NUTRITION - FOCUSED PHYSICAL EXAM:  Remote assessment.   Diet Order:   Diet Order             Diet Heart Room service appropriate? Yes; Fluid consistency: Thin  Diet effective now                   EDUCATION NEEDS:   Not appropriate for education at this time  Skin:  Skin Assessment: Reviewed RN Assessment  Last BM:  PTA  Height:   Ht Readings from Last 1 Encounters:  08/14/22 5\' 2"  (1.575 m)    Weight:   Wt Readings from Last 1 Encounters:  08/17/22 (!) 155.6 kg    Ideal Body Weight:     BMI:  Body mass index is 62.74 kg/m.  Estimated Nutritional Needs:   Kcal:  1700-2175 kcals  Protein:  85-110 gm  Fluid:  </= 1.7 L or per MD (CHF dx)  Bethann Humble, RD, LDN, CNSC.

## 2022-08-17 NOTE — Progress Notes (Signed)
Patient ID: Christine Singleton, female   DOB: Sep 07, 1990, 32 y.o.   MRN: [id number]  Patient napping without CPAP on. Oxygen saturation dropped into low 70s. CPAP applied and oxygen remains in 90s.  Patient stated that at home when she wakes up, it feels like their is an elephant on her chest and she can't breath.  MD made aware.  Lidia Collum, RN

## 2022-08-17 NOTE — Evaluation (Signed)
Physical Therapy Evaluation Patient Details Name: Christine Singleton MRN: [id number] DOB: 1991/02/05 Today's Date: 08/17/2022  History of Present Illness  Pt is a 32 y/o F presentign to ED on 5/30 with SOB, chest CT with bil ground glass opacities, negative for PE, LLE duplex negative for DVT. Admitted for acute on chronic diastolic CHF. No pertinent PMH on file.  Clinical Impression  PTA, pt is typically independent and works at [employer]. Pt presents with significantly decreased cardiopulmonary endurance and slowed gait speed for age. Pt ambulating 200 ft with no assistive device; requires multiple short standing rest breaks. SpO2 95-100% on RA, HR stable. CHF education initiated in addition to activity recommendations and progression. Will benefit from 3x/day ambulation while inpatient. No follow up PT needs anticipated.     Recommendations for follow up therapy are one component of a multi-disciplinary discharge planning process, led by the attending physician.  Recommendations may be updated based on patient status, additional functional criteria and insurance authorization.  Follow Up Recommendations       Assistance Recommended at Discharge PRN  Patient can return home with the following  Assistance with cooking/housework;Assist for transportation;Help with stairs or ramp for entrance    Equipment Recommendations None recommended by PT  Recommendations for Other Services       Functional Status Assessment Patient has had a recent decline in their functional status and demonstrates the ability to make significant improvements in function in a reasonable and predictable amount of time.     Precautions / Restrictions Precautions Precautions: Fall Precaution Comments: watch O2 Restrictions Weight Bearing Restrictions: No      Mobility  Bed Mobility               General bed mobility comments: Received OOB with OT    Transfers Overall transfer level: Modified  independent Equipment used: None                    Ambulation/Gait Ambulation/Gait assistance: Supervision Gait Distance (Feet): 200 Feet Assistive device: None Gait Pattern/deviations: Step-through pattern, Decreased stride length, Wide base of support   Gait velocity interpretation: <1.8 ft/sec, indicate of risk for recurrent falls   General Gait Details: Pt self cueing for activity pacing and rest breaks, requiring ~3 total during walk, slowed pace for age  Stairs            Wheelchair Mobility    Modified Rankin (Stroke Patients Only)       Balance Overall balance assessment: Mild deficits observed, not formally tested                                           Pertinent Vitals/Pain Pain Assessment Pain Assessment: Faces Faces Pain Scale: Hurts little more Pain Location: L abdomen Pain Descriptors / Indicators: Cramping Pain Intervention(s): Monitored during session    Home Living Family/patient expects to be discharged to:: Private residence Living Arrangements: Alone   Type of Home: House Home Access: Stairs to enter   Secretary/administrator of Steps: 6 or 7   Home Layout: One level Home Equipment: None      Prior Function Prior Level of Function : Independent/Modified Independent;Working/employed;Driving             Mobility Comments: no AD use ADLs Comments: works at [employer]        Extremity/Trunk  Assessment   Upper Extremity Assessment Upper Extremity Assessment: Overall WFL for tasks assessed    Lower Extremity Assessment Lower Extremity Assessment: Overall WFL for tasks assessed    Cervical / Trunk Assessment Cervical / Trunk Assessment: Other exceptions Cervical / Trunk Exceptions: body habitus  Communication   Communication: No difficulties  Cognition Arousal/Alertness: Awake/alert Behavior During Therapy: WFL for tasks assessed/performed Overall Cognitive Status: Within  Functional Limits for tasks assessed                                          General Comments General comments (skin integrity, edema, etc.): VSS on 3L O2, attempted to titrate down to 2L O2 however pt desatting to 85%    Exercises     Assessment/Plan    PT Assessment Patient needs continued PT services  PT Problem List Decreased strength;Decreased activity tolerance;Decreased mobility;Cardiopulmonary status limiting activity       PT Treatment Interventions Gait training;Stair training;Functional mobility training;Therapeutic activities;Therapeutic exercise;Balance training;Patient/family education    PT Goals (Current goals can be found in the Care Plan section)  Acute Rehab PT Goals Patient Stated Goal: to return to baseline and to work PT Goal Formulation: With patient Time For Goal Achievement: 08/31/22 Potential to Achieve Goals: Good    Frequency Min 1X/week     Co-evaluation               AM-PAC PT "6 Clicks" Mobility  Outcome Measure Help needed turning from your back to your side while in a flat bed without using bedrails?: None Help needed moving from lying on your back to sitting on the side of a flat bed without using bedrails?: None Help needed moving to and from a bed to a chair (including a wheelchair)?: None Help needed standing up from a chair using your arms (e.g., wheelchair or bedside chair)?: None Help needed to walk in hospital room?: A Little Help needed climbing 3-5 steps with a railing? : A Little 6 Click Score: 22    End of Session Equipment Utilized During Treatment: Oxygen Activity Tolerance: Patient tolerated treatment well Patient left: in chair;with call bell/phone within reach Nurse Communication: Mobility status PT Visit Diagnosis: Difficulty in walking, not elsewhere classified (R26.2)    Time: 1610-9604 PT Time Calculation (min) (ACUTE ONLY): 17 min   Charges:   PT Evaluation $PT Eval Low Complexity: 1  Low          Lillia Pauls, PT, DPT Acute Rehabilitation Services Office [phone]   Norval Morton 08/17/2022, 1:47 PM

## 2022-08-17 NOTE — Evaluation (Signed)
Occupational Therapy Evaluation Patient Details Name: Christine Singleton MRN: [id number] DOB: [birth year] Today's Date: 08/17/2022   History of Present Illness Pt is a 32 y/o F presentign to ED on 5/30 with SOB, chest CT with bil ground glass opacities, negative for PE, LLE duplex negative for DVT. Admitted for acute on chronic diastolic CHF. No pertinent PMH on file.   Clinical Impression   Pt reports independence at baseline with ADLs/functional mobility, lives alone. Upon arrival, pt sleeping with O2 off, SpO2 low 80's on RA, increased to 90's with 3L O2 donned. Pt supervision-minA for ADLs, mod I for bed mobility and supervision for transfers without AD. Attempted to wean pt to 2L O2 however, desatted to mid 80's while walking to bathroom, returned to 3L. Pt to benefit from energy conservation strategies in future session. Pt presenting with impairments listed below, will follow acutely. Anticipate no OT follow up needs at d/c.      Recommendations for follow up therapy are one component of a multi-disciplinary discharge planning process, led by the attending physician.  Recommendations may be updated based on patient status, additional functional criteria and insurance authorization.   Assistance Recommended at Discharge PRN  Patient can return home with the following Assistance with cooking/housework;Help with stairs or ramp for entrance;Assist for transportation    Functional Status Assessment  Patient has had a recent decline in their functional status and demonstrates the ability to make significant improvements in function in a reasonable and predictable amount of time.  Equipment Recommendations  Tub/shower seat    Recommendations for Other Services PT consult     Precautions / Restrictions Precautions Precautions: Fall Precaution Comments: watch O2 Restrictions Weight Bearing Restrictions: No      Mobility Bed Mobility Overal bed mobility: Modified Independent                   Transfers Overall transfer level: Needs assistance Equipment used: None Transfers: Sit to/from Stand Sit to Stand: Supervision                  Balance Overall balance assessment: Mild deficits observed, not formally tested                                         ADL either performed or assessed with clinical judgement   ADL Overall ADL's : Needs assistance/impaired Eating/Feeding: Supervision/ safety   Grooming: Wash/dry hands;Supervision/safety;Standing   Upper Body Bathing: Minimal assistance   Lower Body Bathing: Minimal assistance   Upper Body Dressing : Supervision/safety   Lower Body Dressing: Supervision/safety   Toilet Transfer: Supervision/safety;Ambulation;Regular Social worker and Hygiene: Supervision/safety       Functional mobility during ADLs: Supervision/safety       Vision   Vision Assessment?: No apparent visual deficits     Perception Perception Perception Tested?: No   Praxis Praxis Praxis tested?: Not tested    Pertinent Vitals/Pain Pain Assessment Pain Assessment: Faces Pain Score: 3  Faces Pain Scale: Hurts little more Pain Location: L abdomen Pain Descriptors / Indicators: Cramping Pain Intervention(s): Limited activity within patient's tolerance, Monitored during session, Repositioned     Hand Dominance     Extremity/Trunk Assessment Upper Extremity Assessment Upper Extremity Assessment: Overall WFL for tasks assessed   Lower Extremity Assessment Lower Extremity Assessment: Defer to PT evaluation   Cervical / Trunk Assessment Cervical / Trunk Assessment: Other  exceptions Cervical / Trunk Exceptions: body habitus   Communication Communication Communication: No difficulties   Cognition Arousal/Alertness: Awake/alert Behavior During Therapy: Flat affect Overall Cognitive Status: No family/caregiver present to determine baseline cognitive functioning                                  General Comments: appears baseline, some slowed processing/responses initially, however pt was sleeping upon arrival     General Comments  VSS on 3L O2, attempted to titrate down to 2L O2 however pt desatting to 85%    Exercises     Shoulder Instructions      Home Living Family/patient expects to be discharged to:: Private residence Living Arrangements: Alone   Type of Home: House Home Access: Stairs to enter [employer] of Steps: 6 or 7   Home Layout: One level     Bathroom Shower/Tub: Runner, broadcasting/film/video: None          Prior Functioning/Environment Prior Level of Function : Independent/Modified Independent;Working/employed;Driving             Mobility Comments: no AD use ADLs Comments: works at [employer]        [employer] List: Decreased strength;Decreased range of motion;Decreased activity tolerance;Cardiopulmonary status limiting activity      OT Treatment/Interventions: Self-care/ADL training;Therapeutic exercise;Energy conservation;DME and/or AE instruction;Therapeutic activities;Balance training;Patient/family education    OT Goals(Current goals can be found in the care plan section) Acute Rehab OT Goals Patient Stated Goal: none stated OT Goal Formulation: With patient Time For Goal Achievement: 08/31/22 Potential to Achieve Goals: Good ADL Goals Pt Will Perform Tub/Shower Transfer: Tub transfer;Shower transfer;Independently;ambulating Additional ADL Goal #1: pt will verbalize 3 energy conservation strategies in prep for ADLs  OT Frequency: Min 1X/week    Co-evaluation              AM-PAC OT "6 Clicks" Daily Activity     Outcome Measure Help from another person eating meals?: None Help from another person taking care of personal grooming?: None Help from another person toileting, which includes using toliet, bedpan, or urinal?: A Little Help from another person bathing  (including washing, rinsing, drying)?: A Little Help from another person to put on and taking off regular upper body clothing?: None Help from another person to put on and taking off regular lower body clothing?: A Little 6 Click Score: 21   End of Session Equipment Utilized During Treatment: Oxygen (3L) Nurse Communication: Mobility status  Activity Tolerance: Patient tolerated treatment well Patient left: Other (comment) (handoff in room to PT)  OT Visit Diagnosis: Unsteadiness on feet (R26.81);Other abnormalities of gait and mobility (R26.89);Muscle weakness (generalized) (M62.81)                Time: 4098-1191 OT Time Calculation (min): 16 min Charges:  OT General Charges $OT Visit: 1 Visit OT Evaluation $OT Eval Low Complexity: 1 Low  Darsi Tien K, OTD, OTR/L SecureChat Preferred Acute Rehab [phone]   Latayvia Mandujano K Koonce 08/17/2022, 12:09 PM

## 2022-08-18 ENCOUNTER — Other Ambulatory Visit (HOSPITAL_COMMUNITY): Payer: Self-pay

## 2022-08-18 ENCOUNTER — Encounter (HOSPITAL_COMMUNITY): Payer: Self-pay | Admitting: Family Medicine

## 2022-08-18 DIAGNOSIS — I5033 Acute on chronic diastolic (congestive) heart failure: Secondary | ICD-10-CM | POA: Diagnosis not present

## 2022-08-18 DIAGNOSIS — E119 Type 2 diabetes mellitus without complications: Secondary | ICD-10-CM | POA: Diagnosis not present

## 2022-08-18 DIAGNOSIS — D75839 Thrombocytosis, unspecified: Secondary | ICD-10-CM | POA: Diagnosis not present

## 2022-08-18 DIAGNOSIS — E871 Hypo-osmolality and hyponatremia: Secondary | ICD-10-CM | POA: Insufficient documentation

## 2022-08-18 LAB — CBC WITH DIFFERENTIAL/PLATELET
Abs Immature Granulocytes: 0.04 10*3/uL (ref 0.00–0.07)
Basophils Absolute: 0.1 10*3/uL (ref 0.0–0.1)
Basophils Relative: 0 %
Eosinophils Absolute: 0.2 10*3/uL (ref 0.0–0.5)
Eosinophils Relative: 1 %
HCT: 41.1 % (ref 36.0–46.0)
Hemoglobin: 12.6 g/dL (ref 12.0–15.0)
Immature Granulocytes: 0 %
Lymphocytes Relative: 20 %
Lymphs Abs: 3 10*3/uL (ref 0.7–4.0)
MCH: 19.5 pg — ABNORMAL LOW (ref 26.0–34.0)
MCHC: 30.7 g/dL (ref 30.0–36.0)
MCV: 63.5 fL — ABNORMAL LOW (ref 80.0–100.0)
Monocytes Absolute: 1.2 10*3/uL — ABNORMAL HIGH (ref 0.1–1.0)
Monocytes Relative: 8 %
Neutro Abs: 10.7 10*3/uL — ABNORMAL HIGH (ref 1.7–7.7)
Neutrophils Relative %: 71 %
Platelets: 480 10*3/uL — ABNORMAL HIGH (ref 150–400)
RBC: 6.47 MIL/uL — ABNORMAL HIGH (ref 3.87–5.11)
RDW: 21.7 % — ABNORMAL HIGH (ref 11.5–15.5)
WBC: 15.1 10*3/uL — ABNORMAL HIGH (ref 4.0–10.5)
nRBC: 0 % (ref 0.0–0.2)

## 2022-08-18 LAB — BASIC METABOLIC PANEL
Anion gap: 9 (ref 5–15)
BUN: 11 mg/dL (ref 6–20)
CO2: 36 mmol/L — ABNORMAL HIGH (ref 22–32)
Calcium: 8.8 mg/dL — ABNORMAL LOW (ref 8.9–10.3)
Chloride: 88 mmol/L — ABNORMAL LOW (ref 98–111)
Creatinine, Ser: 0.74 mg/dL (ref 0.44–1.00)
GFR, Estimated: >60 mL/min (ref >60)
Glucose, Bld: 101 mg/dL — ABNORMAL HIGH (ref 70–99)
Potassium: 4.7 mmol/L (ref 3.5–5.1)
Sodium: 133 mmol/L — ABNORMAL LOW (ref 135–145)

## 2022-08-18 LAB — GLUCOSE, CAPILLARY: Glucose-Capillary: 129 mg/dL — ABNORMAL HIGH (ref 70–99)

## 2022-08-18 LAB — HEMOGLOBIN A1C
Hgb A1c MFr Bld: 7.3 % — ABNORMAL HIGH (ref 4.8–5.6)
Mean Plasma Glucose: 163 mg/dL

## 2022-08-18 LAB — POTASSIUM: Potassium: 3.8 mmol/L (ref 3.5–5.1)

## 2022-08-18 LAB — MAGNESIUM: Magnesium: 2.1 mg/dL (ref 1.7–2.4)

## 2022-08-18 MED ORDER — POTASSIUM CHLORIDE CRYS ER 20 MEQ PO TBCR
40.0000 meq | EXTENDED_RELEASE_TABLET | Freq: Once | ORAL | Status: AC
  Administered 2022-08-18: 40 meq via ORAL
  Filled 2022-08-18: qty 2

## 2022-08-18 MED ORDER — TRAMADOL HCL 50 MG PO TABS
50.0000 mg | ORAL_TABLET | Freq: Four times a day (QID) | ORAL | Status: DC | PRN
  Administered 2022-08-18: 50 mg via ORAL
  Filled 2022-08-18: qty 1

## 2022-08-18 NOTE — Plan of Care (Signed)
  Problem: Education: Goal: Knowledge of General Education information will improve Description: Including pain rating scale, medication(s)/side effects and non-pharmacologic comfort measures Outcome: Progressing   Problem: Clinical Measurements: Goal: Diagnostic test results will improve Outcome: Progressing   Problem: Nutrition: Goal: Adequate nutrition will be maintained Outcome: Progressing   

## 2022-08-18 NOTE — Progress Notes (Signed)
respiratory made aware of overnight oxymetry that has been ordered for Christine Singleton, plan for possible home tomorrow with nocturnal supplemental 02 until she can follow up with outpatient sleep study.

## 2022-08-18 NOTE — Progress Notes (Signed)
Mobility Specialist Progress Note:    08/18/22 1400  Mobility  Activity Ambulated with assistance in hallway  Level of Assistance Other (Comment) (HHA)  Assistive Device None  Distance Ambulated (ft) 250 ft  Activity Response Tolerated well  Mobility Referral Yes  $Mobility charge 1 Mobility  Mobility Specialist Start Time (ACUTE ONLY) 1350  Mobility Specialist Stop Time (ACUTE ONLY) 1402  Mobility Specialist Time Calculation (min) (ACUTE ONLY) 12 min   Pt received in bed, hesitant but agreeable to ambulate. Pt c/o lightheadedness, HHA given for safety otherwise no c/o. Pt assisted back in bed left sitting EOB w/ call bell in hand.   During Mobility   SPO2 98% 4L/min  SPO2 91% 2L/min   Thompson Grayer Mobility Specialist  Please contact vis Secure Chat or  Rehab Office [phone]

## 2022-08-18 NOTE — Progress Notes (Signed)
   Heart Failure Stewardship Pharmacist Progress Note  Received notification from [facility] Medicaid that prior authorization for Jardiance is required.   PA submitted on CoverMyMeds Key [id number] Status is pending   Sharen Hones, PharmD, BCPS Heart Failure Stewardship Pharmacist Phone [phone]

## 2022-08-18 NOTE — Assessment & Plan Note (Addendum)
At the time of her discharge her serum cr is 0,94 with K at 4,3 and serum bicarbonate at 35. Na 136.  Plan to follow up renal function and electrolytes.

## 2022-08-18 NOTE — Progress Notes (Signed)
   Heart Failure Stewardship Pharmacist Progress Note   PCP: Patient, No Pcp Per PCP-Cardiologist: None    HPI:  32 yo F with PMH of T2DM and obesity.   Presented to the ED with shortness of breath, chest discomfort, orthopnea, LE edema, and weight gain. CXR without cardiopulmonary disease. Dopplers negative for DVT. CTA showed atelectasis, no PE. BNP elevated. ECHO 5/31 showed LVEF 45-50%, regional wall motion abnormalities, elevated LVEDP, RV normal.   Current HF Medications: Diuretic: furosemide 60 mg IV BID MRA: spironolactone 12.5 mg daily SGLT2i: Jardiance 10 mg daily  Prior to admission HF Medications: None  Pertinent Lab Values: Serum creatinine 0.74, BUN 11, Potassium 4.7 (hemolysis), Sodium 133, BNP 409.6, Magnesium 2.1, A1c 7.3   Vital Signs: Weight: 340 lbs (admission weight: 358 lbs) Blood pressure: 120-140/80s  Heart rate: 100s  I/O: -0.1L yesterday; net -0.8L (incomplete)  Medication Assistance / Insurance Benefits Check: Does the patient have prescription insurance?  Yes Type of insurance plan: [place] Medicaid  Outpatient Pharmacy:  Prior to admission outpatient pharmacy: [facility] Is the patient willing to use [facility] TOC pharmacy at discharge? Yes   Assessment: 1. Acute CHF (LVEF 45-50%), pending ischemic evaluation. NYHA class III symptoms. - Continue furosemide 60 mg IV BID. May be able to transition to PO tomorrow. Strict I/Os and daily weights. Keep K>4 and Mg>2. - Continue spironolactone 12.5 mg daily. K 4.7 today but hemolyzed. Consider increasing to 25 mg daily tomorrow pending potassium - Continue Jardiance 10 mg daily    Plan: 1) Medication changes recommended at this time: - Repeat BMET - Increase to spironolactone 25 mg daily tomorrow pending K  2) Patient assistance: - Jardiance requires prior authorization - will complete today  3)  Education  - Patient has been educated on current HF medications and potential additions to HF  medication regimen - Patient verbalizes understanding that over the next few months, these medication doses may change and more medications may be added to optimize HF regimen - Patient has been educated on basic disease state pathophysiology and goals of therapy   Sharen Hones, PharmD, BCPS Heart Failure Stewardship Pharmacist Phone [phone]

## 2022-08-18 NOTE — Progress Notes (Addendum)
Progress Note   Patient: Christine Singleton BMW:[id number] DOB: 1990/07/10 DOA: 08/14/2022     4 DOS: the patient was seen and examined on 08/18/2022   Brief hospital course: Christine Singleton was admitted to the hospital with the working diagnosis of heart failure exacerbation.   32 yo female with the past medical history of T2DM, and obesity class 3 who presented with dyspnea. She endorsed worsening dyspnea for the last 2 weeks, with edema of her lower extremities, PND and orthopnea. On her initial physical examination her blood pressure was 128/86, HR 111, RR 27 and 02 saturation 67%, lungs with no wheezing, heart with S1 and S2 present and rhythmic, abdomen with no distention and positive lower extremity edema.   Na 130, K 4,6 Cl 99, bicarbonate 24, glucose 96 bun 12 cr 0,74  BNP 409 Wbc 14.6 hgb 11.4 plt 418  D dimer 2,38   Chest radiograph with cardiomegaly, bilateral hilar vascular congestion with no effusions or infiltrates.   CT chest with faint bilateral ground glass opacities, no evidence of pulmonary embolism (limited evaluation due to poor signal noise ratio).  EKG 111 bpm, normal axis, normal intervals, sinus rhythm with no significant ST segment changes, negative T wave lead III, AvF, V3 and V4.   Patient was placed on furosemide for diuresis.   06/03 volume status is improving. Continue diuresis.    Assessment and Plan: * Acute on chronic diastolic CHF (congestive heart failure) (HCC) Echocardiogram with mild reduction in systolic function 45 to 50%, entire inferior wall and posterior wall are hypokinetic. RV with preserved systolic function. RVSP 28,8 no significant valvular disease.  Flattening of interventricular septum on diastole.   Documented urine output is 500 ml. Likely not accurate.  She has lost 8 kg since admission.  Systolic blood pressure 143 to 118 mmHg.   Continue with furosemide to 60 mg IV q12 hrs Continue with SGLT 2 inh and spironolactone.   Doubt  ischemic cardiomyopathy, but considering wall motion abnormalities, mild troponin elevation and T wave inversions may need further work up, will check with cardiology.  EKG with persistent T wave inversions with no changes.   Non-insulin dependent type 2 diabetes mellitus (HCC) Fasting glucose is 111 Uncontrolled T2Dm with Hgb A1c of 7.3    Class 3 obesity (HCC) Calculated BMI is 64  Possible OSA, continue Cpap at night and will need outpatient sleep study.   Thrombocytosis Leukocytosis  Chronic elevation of wbc and plt Follow up wbc is 15.1 and plt 480  Check iron panel in am.   Hyponatremia Renal function with serum cr at 0,74 with K at 4,7 and serum bicarbonate at 36, Na 133. Mg 2.1  Follow up renal function and electrolytes in am.         Subjective: patient with improvement in dyspnea but not yet back to baseline, no chest pain.   Physical Exam: Vitals:   08/17/22 1945 08/18/22 0024 08/18/22 0556 08/18/22 0838  BP: 111/73 112/67 117/79 (!) 140/93  Pulse: (!) 102 96 96 (!) 106  Resp: 15 15 15 20   Temp: 98.4 F (36.9 C) 98.4 F (36.9 C) 98.4 F (36.9 C) 98.1 F (36.7 C)  TempSrc: Oral Oral Oral Oral  SpO2: 90% 93%  96%  Weight:   (!) 154.3 kg   Height:       Neurology awake and alert ENT with no pallor Cardiovascular with S1 and S2 present and rhythmic with no gallops, rubs or murmurs No JVD Trace bilateral lower  extremity edema Respiratory with no rales or wheezing Abdomen with no distention  Trace on pitting lower extremity edema  Data Reviewed:    Family Communication: no family at the bedside   Disposition: Status is: Inpatient Remains inpatient appropriate because: heart failure   Planned Discharge Destination: Home      Author: Coralie Keens, MD 08/18/2022 1:48 PM  For on call review [email or web address].

## 2022-08-18 NOTE — Progress Notes (Signed)
Heart Failure Nurse Navigator Progress Note  PCP: Patient, No Pcp Per PCP-Cardiologist: None Admission Diagnosis: Dyspnea, hypertension, Acute on chronic congestive heart failure Admitted from: Home  Presentation:   Christine Singleton presented with shortness of breath, chest heaviness and weight gain the last 2 weeks. Unable to sleep/breathe at night. BP 151/109, HR 107, BNP 409, BMI 62.21. IV lasix given, CXR without cardiopulmonary disease, Dopplers negative for a DVT.   Patient was educated in detail about the sign and symptoms of heart failure, daily weights, when to call her doctor or go to the ED, Diet/ fluid restrictions, taking all medications as prescribed and attending all medical appointments. Patient verbalized her understanding of education. A HF TOC is scheduled for 09/05/2022 @ 11 am.   ECHO/ LVEF: 45-50%  Clinical Course:  Past Medical History:  Diagnosis Date   Gestational diabetes    With first pregnancy     Social History   Socioeconomic History   Marital status: Single    Spouse name: Not on file   Number of children: Not on file   Years of education: Not on file   Highest education level: Not on file  Occupational History   Not on file  Tobacco Use   Smoking status: Former    Types: Cigarettes    Quit date: 08/03/2015    Years since quitting: 7.0   Smokeless tobacco: Never  Substance and Sexual Activity   Alcohol use: No   Drug use: No    Types: Marijuana    Comment: smokes to help with morning sickness    Sexual activity: Yes    Birth control/protection: None  Other Topics Concern   Not on file  Social History Narrative   Not on file   Social Determinants of Health   Financial Resource Strain: Not on file  Food Insecurity: Not on file  Transportation Needs: Not on file  Physical Activity: Not on file  Stress: Not on file  Social Connections: Not on file   Education Assessment and Provision:  Detailed education and instructions provided on  heart failure disease management including the following:  Signs and symptoms of Heart Failure When to call the physician Importance of daily weights Low sodium diet Fluid restriction Medication management Anticipated future follow-up appointments  Patient education given on each of the above topics.  Patient acknowledges understanding via teach back method and acceptance of all instructions.  Education Materials:  "Living Better With Heart Failure" Booklet, HF zone tool, & Daily Weight Tracker Tool.  Patient has scale at home: No, will buy one Patient has pill box at home: NA     High Risk Criteria for Readmission and/or Poor Patient Outcomes: Heart failure hospital admissions (last 6 months): 0  No Show rate: 46% Difficult social situation: No Demonstrates medication adherence: Yes Primary Language: English Literacy level: Reading, writing, and comprehension.   Barriers of Care:   Diet/ fluids/ daily weights (soda/ salty foods) Elevated BMI 62.21 New HF   Considerations/Referrals:   Referral made to Heart Failure Pharmacist Stewardship: Yes Referral made to Heart Failure CSW/NCM TOC: No Referral made to Heart & Vascular TOC clinic: Yes, 09/05/2022 @ 11 am   Items for Follow-up on DC/TOC: Diet/ fluids/ daily weights ( soda/ salty foods) Continued HF education Did she buy a scale?   Rhae Hammock, BSN, Scientist, clinical (histocompatibility and immunogenetics) Only

## 2022-08-18 NOTE — Progress Notes (Signed)
RT placed patient on overnight pulse ox study device. RT to follow up later.

## 2022-08-19 ENCOUNTER — Other Ambulatory Visit (HOSPITAL_COMMUNITY): Payer: Self-pay

## 2022-08-19 ENCOUNTER — Other Ambulatory Visit (HOSPITAL_BASED_OUTPATIENT_CLINIC_OR_DEPARTMENT_OTHER): Payer: Self-pay

## 2022-08-19 DIAGNOSIS — I10 Essential (primary) hypertension: Secondary | ICD-10-CM | POA: Diagnosis not present

## 2022-08-19 DIAGNOSIS — E871 Hypo-osmolality and hyponatremia: Secondary | ICD-10-CM | POA: Diagnosis not present

## 2022-08-19 DIAGNOSIS — I5033 Acute on chronic diastolic (congestive) heart failure: Secondary | ICD-10-CM | POA: Diagnosis not present

## 2022-08-19 LAB — IRON AND TIBC
Iron: 25 ug/dL — ABNORMAL LOW (ref 28–170)
Saturation Ratios: 5 % — ABNORMAL LOW (ref 10.4–31.8)
TIBC: 554 ug/dL — ABNORMAL HIGH (ref 250–450)
UIBC: 529 ug/dL

## 2022-08-19 LAB — BASIC METABOLIC PANEL
Anion gap: 12 (ref 5–15)
BUN: 15 mg/dL (ref 6–20)
CO2: 35 mmol/L — ABNORMAL HIGH (ref 22–32)
Calcium: 9.5 mg/dL (ref 8.9–10.3)
Chloride: 89 mmol/L — ABNORMAL LOW (ref 98–111)
Creatinine, Ser: 0.94 mg/dL (ref 0.44–1.00)
GFR, Estimated: >60 mL/min (ref >60)
Glucose, Bld: 113 mg/dL — ABNORMAL HIGH (ref 70–99)
Potassium: 4.3 mmol/L (ref 3.5–5.1)
Sodium: 136 mmol/L (ref 135–145)

## 2022-08-19 LAB — HEMOGLOBIN A1C
Hgb A1c MFr Bld: 7.1 % — ABNORMAL HIGH (ref 4.8–5.6)
Mean Plasma Glucose: 157 mg/dL

## 2022-08-19 LAB — FERRITIN: Ferritin: 20 ng/mL (ref 11–307)

## 2022-08-19 LAB — GLUCOSE, CAPILLARY: Glucose-Capillary: 139 mg/dL — ABNORMAL HIGH (ref 70–99)

## 2022-08-19 LAB — TRANSFERRIN: Transferrin: 384 mg/dL — ABNORMAL HIGH (ref 192–382)

## 2022-08-19 MED ORDER — LOSARTAN POTASSIUM 25 MG PO TABS: 25.0000 mg | ORAL_TABLET | Freq: Every day | ORAL | Status: DC

## 2022-08-19 MED ORDER — METFORMIN HCL 500 MG PO TABS
500.0000 mg | ORAL_TABLET | Freq: Every day | ORAL | 0 refills | Status: DC
  Filled 2022-08-19: qty 30, 30d supply, fill #0

## 2022-08-19 MED ORDER — EMPAGLIFLOZIN 10 MG PO TABS
10.0000 mg | ORAL_TABLET | Freq: Every day | ORAL | 0 refills | Status: DC
  Filled 2022-08-19: qty 30, 30d supply, fill #0

## 2022-08-19 MED ORDER — FUROSEMIDE 20 MG PO TABS: 20.0000 mg | ORAL_TABLET | Freq: Every day | ORAL | Status: DC | PRN

## 2022-08-19 MED ORDER — SODIUM CHLORIDE 0.9 % IV SOLN
510.0000 mg | Freq: Once | INTRAVENOUS | Status: AC
  Administered 2022-08-19: 510 mg via INTRAVENOUS
  Filled 2022-08-19: qty 17

## 2022-08-19 MED ORDER — SPIRONOLACTONE 25 MG PO TABS
25.0000 mg | ORAL_TABLET | Freq: Every day | ORAL | 0 refills | Status: DC
  Filled 2022-08-19: qty 30, 30d supply, fill #0

## 2022-08-19 MED ORDER — LOSARTAN POTASSIUM 25 MG PO TABS
25.0000 mg | ORAL_TABLET | Freq: Every day | ORAL | 0 refills | Status: DC
  Filled 2022-08-19: qty 30, 30d supply, fill #0

## 2022-08-19 MED ORDER — BLOOD PRESSURE KIT: PACK | 0 refills | Status: DC

## 2022-08-19 MED ORDER — ACETAMINOPHEN 325 MG PO TABS: 650.0000 mg | ORAL_TABLET | Freq: Four times a day (QID) | ORAL | Status: DC | PRN

## 2022-08-19 MED ORDER — CYCLOBENZAPRINE HCL 5 MG PO TABS
5.0000 mg | ORAL_TABLET | Freq: Three times a day (TID) | ORAL | Status: DC
  Administered 2022-08-19: 5 mg via ORAL
  Filled 2022-08-19: qty 1

## 2022-08-19 MED ORDER — SPIRONOLACTONE 25 MG PO TABS: 25.0000 mg | ORAL_TABLET | Freq: Every day | ORAL | Status: DC

## 2022-08-19 MED ORDER — FUROSEMIDE 20 MG PO TABS
20.0000 mg | ORAL_TABLET | Freq: Every day | ORAL | 0 refills | Status: DC | PRN
  Filled 2022-08-19: qty 30, 30d supply, fill #0

## 2022-08-19 MED ORDER — METFORMIN HCL 500 MG PO TABS: 500.0000 mg | ORAL_TABLET | Freq: Every day | ORAL | Status: DC

## 2022-08-19 NOTE — Progress Notes (Signed)
   Heart Failure Stewardship Pharmacist Progress Note   PCP: Patient, No Pcp Per PCP-Cardiologist: None    HPI:  32 yo F with PMH of T2DM and obesity.   Presented to the ED with shortness of breath, chest discomfort, orthopnea, LE edema, and weight gain. CXR without cardiopulmonary disease. Dopplers negative for DVT. CTA showed atelectasis, no PE. BNP elevated. ECHO 5/31 showed LVEF 45-50%, regional wall motion abnormalities, elevated LVEDP, RV normal.   Current HF Medications: MRA: spironolactone 12.5 mg daily SGLT2i: Jardiance 10 mg daily Other: Feraheme 510 mg IV x 1  Prior to admission HF Medications: None  Pertinent Lab Values: Serum creatinine 0.94, BUN 15, Potassium 4.3, Sodium 136, BNP 409.6, Magnesium 2.1, A1c 7.3  Ferritin 20, TSAT 5  Vital Signs: Weight: 338 lbs (admission weight: 358 lbs) Blood pressure: 120/80s  Heart rate: 110s  I/O: incomplete  Medication Assistance / Insurance Benefits Check: Does the patient have prescription insurance?  Yes Type of insurance plan: [place] Medicaid  Outpatient Pharmacy:  Prior to admission outpatient pharmacy: [facility] Is the patient willing to use [facility] TOC pharmacy at discharge? Yes   Assessment: 1. Acute CHF (LVEF 45-50%), pending ischemic evaluation. NYHA class II symptoms. - Agree with stopping IV lasix today. May be able to transition to PO tomorrow. Strict I/Os and daily weights. Keep K>4 and Mg>2. - On spironolactone 12.5 mg daily. Consider increasing to 25 mg daily - Continue Jardiance 10 mg daily - Iron stores low. Agree with adding Feraheme 510 mg IV x 1   Plan: 1) Medication changes recommended at this time: - Increase spironolactone to 25 mg daily  2) Patient assistance: - Jardiance requires prior authorization - approval pending  3)  Education  - Patient has been educated on current HF medications and potential additions to HF medication regimen - Patient verbalizes understanding that over the  next few months, these medication doses may change and more medications may be added to optimize HF regimen - Patient has been educated on basic disease state pathophysiology and goals of therapy   Sharen Hones, PharmD, BCPS Heart Failure Stewardship Pharmacist Phone [phone]

## 2022-08-19 NOTE — Progress Notes (Signed)
   08/19/22 1454  Vitals  BP 124/88  MAP (mmHg) 97  ECG Heart Rate (!) 104  Resp 17  MEWS COLOR  MEWS Score Color Green  Orthostatic Lying   BP- Lying 124/88  Pulse- Lying 103  Orthostatic Sitting  BP- Sitting 124/86  Pulse- Sitting 107  Orthostatic Standing at 0 minutes  BP- Standing at 0 minutes 114/88  Pulse- Standing at 0 minutes 117  Orthostatic Standing at 3 minutes  BP- Standing at 3 minutes (!) 129/93  Pulse- Standing at 3 minutes 118  MEWS Score  MEWS Temp 0  MEWS Systolic 0  MEWS Pulse 1  MEWS RR 0  MEWS LOC 0  MEWS Score 1   RN performed orthostatic vs with patient at bedside. Patient tolerated well. RN educated patient on improving circulation while transitioning to various position. Scale at bedside. RN provided script for blood pressure kit. TOC meds at bedside.

## 2022-08-19 NOTE — Plan of Care (Signed)
  Problem: Education: Goal: Knowledge of General Education information will improve Description: Including pain rating scale, medication(s)/side effects and non-pharmacologic comfort measures Outcome: Adequate for Discharge   Problem: Health Behavior/Discharge Planning: Goal: Ability to manage health-related needs will improve Outcome: Adequate for Discharge   Problem: Clinical Measurements: Goal: Ability to maintain clinical measurements within normal limits will improve Outcome: Adequate for Discharge Goal: Will remain free from infection Outcome: Adequate for Discharge Goal: Diagnostic test results will improve Outcome: Adequate for Discharge Goal: Respiratory complications will improve Outcome: Adequate for Discharge Goal: Cardiovascular complication will be avoided Outcome: Adequate for Discharge   Problem: Activity: Goal: Risk for activity intolerance will decrease Outcome: Adequate for Discharge   Problem: Nutrition: Goal: Adequate nutrition will be maintained Outcome: Adequate for Discharge   Problem: Coping: Goal: Level of anxiety will decrease Outcome: Adequate for Discharge   Problem: Elimination: Goal: Will not experience complications related to bowel motility Outcome: Adequate for Discharge Goal: Will not experience complications related to urinary retention Outcome: Adequate for Discharge   Problem: Pain Managment: Goal: General experience of comfort will improve Outcome: Adequate for Discharge   Problem: Safety: Goal: Ability to remain free from injury will improve Outcome: Adequate for Discharge   Problem: Skin Integrity: Goal: Risk for impaired skin integrity will decrease Outcome: Adequate for Discharge   Problem: Education: Goal: Ability to demonstrate management of disease process will improve Outcome: Adequate for Discharge Goal: Ability to verbalize understanding of medication therapies will improve Outcome: Adequate for Discharge Goal:  Individualized Educational Video(s) Outcome: Adequate for Discharge   Problem: Activity: Goal: Capacity to carry out activities will improve Outcome: Adequate for Discharge   Problem: Cardiac: Goal: Ability to achieve and maintain adequate cardiopulmonary perfusion will improve Outcome: Adequate for Discharge   Problem: Education: Goal: Ability to describe self-care measures that may prevent or decrease complications (Diabetes Survival Skills Education) will improve Outcome: Adequate for Discharge Goal: Individualized Educational Video(s) Outcome: Adequate for Discharge   Problem: Coping: Goal: Ability to adjust to condition or change in health will improve Outcome: Adequate for Discharge   Problem: Fluid Volume: Goal: Ability to maintain a balanced intake and output will improve Outcome: Adequate for Discharge   Problem: Health Behavior/Discharge Planning: Goal: Ability to identify and utilize available resources and services will improve Outcome: Adequate for Discharge Goal: Ability to manage health-related needs will improve Outcome: Adequate for Discharge   Problem: Metabolic: Goal: Ability to maintain appropriate glucose levels will improve Outcome: Adequate for Discharge   Problem: Nutritional: Goal: Maintenance of adequate nutrition will improve Outcome: Adequate for Discharge Goal: Progress toward achieving an optimal weight will improve Outcome: Adequate for Discharge   Problem: Skin Integrity: Goal: Risk for impaired skin integrity will decrease Outcome: Adequate for Discharge   Problem: Tissue Perfusion: Goal: Adequacy of tissue perfusion will improve Outcome: Adequate for Discharge   

## 2022-08-19 NOTE — Progress Notes (Signed)
Occupational Therapy Treatment Patient Details Name: Christine Singleton MRN: [id number] DOB: 1990-11-17 Today's Date: 08/19/2022   History of present illness Pt is a 32 y/o F presentign to ED on 5/30 with SOB, chest CT with bil ground glass opacities, negative for PE, LLE duplex negative for DVT. Admitted for acute on chronic diastolic CHF. No pertinent PMH on file.   OT comments  Patient on [facility] with nursing present upon entry and nursing stating patient was orthostatic and was marching in place to increase BP. BP 101/42 while on BSC and 123/97 in standing during toilet hygiene after marching in place. Patient returned to supine and BP was 128/88. Handout for energy conservation strategies provided to patient and reviewed with patient. Patient stating she was having difficulty with LB dressing and was interested in AE training next visit. Acute OT to continue to follow the discharge recommendations continue to be appropriate.    Recommendations for follow up therapy are one component of a multi-disciplinary discharge planning process, led by the attending physician.  Recommendations may be updated based on patient status, additional functional criteria and insurance authorization.    Assistance Recommended at Discharge PRN  Patient can return home with the following  Assistance with cooking/housework;Help with stairs or ramp for entrance;Assist for transportation   Equipment Recommendations  Tub/shower seat    Recommendations for Other Services      Precautions / Restrictions Precautions Precautions: Fall Precaution Comments: watch O2 Restrictions Weight Bearing Restrictions: No       Mobility Bed Mobility Overal bed mobility: Modified Independent             General bed mobility comments: on BSC upon entry and returned to supine without assistance    Transfers Overall transfer level: Modified independent Equipment used: None                     Balance                                            ADL either performed or assessed with clinical judgement   ADL Overall ADL's : Needs assistance/impaired                         Toilet Transfer: Electrical engineer Details (indicate cue type and reason): on BSC upon entry assisted back to EOB Toileting- Clothing Manipulation and Hygiene: Supervision/safety;Minimal assistance Toileting - Clothing Manipulation Details (indicate cue type and reason): assistance for bottom for throughness            Extremity/Trunk Assessment              Vision       Perception     Praxis      Cognition Arousal/Alertness: Awake/alert Behavior During Therapy: WFL for tasks assessed/performed Overall Cognitive Status: Within Functional Limits for tasks assessed                                 General Comments: alert and oriented        Exercises      Shoulder Instructions       General Comments Nursing stating patient ws orthostatic when transferring to [facility]. 101/42 seated on BSC, after marching in place 123/97 in standing, in supine 128/88    Pertinent  Vitals/ Pain       Pain Assessment Pain Assessment: Faces Faces Pain Scale: Hurts a little bit Pain Location: L abdomen Pain Descriptors / Indicators: Cramping Pain Intervention(s): Monitored during session  Home Living                                          Prior Functioning/Environment              Frequency  Min 1X/week        Progress Toward Goals  OT Goals(current goals can now be found in the care plan section)  Progress towards OT goals: Progressing toward goals  Acute Rehab OT Goals Patient Stated Goal: go home OT Goal Formulation: With patient Time For Goal Achievement: 08/31/22 Potential to Achieve Goals: Good ADL Goals Pt Will Perform Tub/Shower Transfer: Tub transfer;Shower transfer;Independently;ambulating Additional ADL Goal  #1: pt will verbalize 3 energy conservation strategies in prep for ADLs  Plan Discharge plan remains appropriate    Co-evaluation                 AM-PAC OT "6 Clicks" Daily Activity     Outcome Measure   Help from another person eating meals?: None Help from another person taking care of personal grooming?: None Help from another person toileting, which includes using toliet, bedpan, or urinal?: A Little Help from another person bathing (including washing, rinsing, drying)?: A Little Help from another person to put on and taking off regular upper body clothing?: None Help from another person to put on and taking off regular lower body clothing?: A Little 6 Click Score: 21    End of Session Equipment Utilized During Treatment: Oxygen  OT Visit Diagnosis: Unsteadiness on feet (R26.81);Other abnormalities of gait and mobility (R26.89);Muscle weakness (generalized) (M62.81)   Activity Tolerance Patient tolerated treatment well   Patient Left in bed;with call bell/phone within reach   Nurse Communication Mobility status        Time: 1610-9604 OT Time Calculation (min): 28 min  Charges: OT General Charges $OT Visit: 1 Visit OT Treatments $Self Care/Home Management : 8-22 mins $Therapeutic Activity: 8-22 mins  Alfonse Flavors, OTA Acute Rehabilitation Services  Office [phone]   Dewain Penning 08/19/2022, 11:04 AM

## 2022-08-19 NOTE — Progress Notes (Signed)
Physical Therapy Treatment Patient Details Name: Christine Singleton MRN: [id number] DOB: Jan 09, 1991 Today's Date: 08/19/2022   History of Present Illness Pt is a 32 y/o F presenting to ED on 5/30 with shortness of breath, chest CT with bil ground glass opacities, negative for PE, LLE duplex negative for DVT. Admitted for acute on chronic diastolic CHF. No pertinent PMH on file.    PT Comments    Pt received seated EOB, agreeable to therapy session and with good participation and slightly improved tolerance for standing/gait trial with rollator for energy conservation. Pt needing up to min guard for safety with functional mobility tasks and cues for safe UE placement. Pt BP stable after return to supine (did not check standing BP) but with TED hose donned, only mild c/o lightheadedness and moderate c/o headache pain (improved in supine). Reviewed CHF guidelines from booklet, with emphasis on importance of low salt diet and daily weights (scale now in room), pt with many questions about low salt diet options and may benefit from consult with registered dietician vs HF team for further education. Pt continues to benefit from PT services to progress toward functional mobility goals.    Recommendations for follow up therapy are one component of a multi-disciplinary discharge planning process, led by the attending physician.  Recommendations may be updated based on patient status, additional functional criteria and insurance authorization.  Follow Up Recommendations       Assistance Recommended at Discharge PRN  Patient can return home with the following Assistance with cooking/housework;Assist for transportation;Help with stairs or ramp for entrance   Equipment Recommendations  Rollator (4 wheels) (bariatric width)    Recommendations for Other Services       Precautions / Restrictions Precautions Precautions: Fall Precaution Comments: watch O2 (goal >88%) /BP Restrictions Weight Bearing  Restrictions: No     Mobility  Bed Mobility Overal bed mobility: Modified Independent             General bed mobility comments: Increased time/effort to scoot toward HOB in supine    Transfers Overall transfer level: Modified independent Equipment used: None Transfers: Sit to/from Stand Sit to Stand: Min guard           General transfer comment: min guard for stand>sit onto rollator due to loose brakes and for cues for use of brakes/safety; pt ignores cues to push from bed/reach back for bed surface. Pt also able to stand from EOB without using arms.    Ambulation/Gait Ambulation/Gait assistance: Min guard Gait Distance (Feet): 50 Feet (x2 with seated break) Assistive device: Rollator (4 wheels) Gait Pattern/deviations: Step-through pattern, Decreased stride length, Wide base of support       General Gait Details: Distance limited due to c/o headache and fatigue; pt took seated break on rollator halfway through trial. Mild lightheadedness but more c/o fatigue and headache than dizziness.   Stairs Stairs: Yes Stairs assistance: Min guard Stair Management: One rail Right, One rail Left, Step to pattern, Forwards Number of Stairs: 3 (x2 trials with seated break midway through) General stair comments: defer due to c/o headache   Wheelchair Mobility    Modified Rankin (Stroke Patients Only)       Balance Overall balance assessment: Mild deficits observed, not formally tested                                          Cognition Arousal/Alertness:  Awake/alert Behavior During Therapy: WFL for tasks assessed/performed Overall Cognitive Status: Within Functional Limits for tasks assessed                                 General Comments: Pt pleasantly cooperative.        Exercises      General Comments General comments (skin integrity, edema, etc.): BP 121/80 (90) HR 96 bpm resting and to 117 bpm with exertion; SpO2 98% on 2L  O2 [place] with exertion.      Pertinent Vitals/Pain Pain Assessment Pain Assessment: Faces Faces Pain Scale: Hurts even more Pain Location: headache Pain Descriptors / Indicators: Discomfort, Grimacing, Headache Pain Intervention(s): Limited activity within patient's tolerance, Monitored during session, Repositioned, Patient requesting pain meds-RN notified           PT Goals (current goals can now be found in the care plan section) Acute Rehab PT Goals PT Goal Formulation: With patient Time For Goal Achievement: 08/31/22 Progress towards PT goals: Progressing toward goals    Frequency    Min 1X/week      PT Plan Current plan remains appropriate       AM-PAC PT "6 Clicks" Mobility   Outcome Measure  Help needed turning from your back to your side while in a flat bed without using bedrails?: None Help needed moving from lying on your back to sitting on the side of a flat bed without using bedrails?: None Help needed moving to and from a bed to a chair (including a wheelchair)?: None Help needed standing up from a chair using your arms (e.g., wheelchair or bedside chair)?: None Help needed to walk in hospital room?: A Little Help needed climbing 3-5 steps with a railing? : A Little 6 Click Score: 22    End of Session Equipment Utilized During Treatment: Oxygen Activity Tolerance: Other (comment);Patient tolerated treatment well;Patient limited by pain;Patient limited by fatigue (headache.) Patient left: in bed;with call bell/phone within reach;Other (comment) (HOB >30* for ease of breathing) Nurse Communication: Mobility status;Other (comment) (headache) PT Visit Diagnosis: Difficulty in walking, not elsewhere classified (R26.2)     Time: 1610-9604 PT Time Calculation (min) (ACUTE ONLY): 21 min  Charges:  $Gait Training: 8-22 mins                     Virginio Isidore P., PTA Acute Rehabilitation Services Secure Chat Preferred 9a-5:30pm Office: [phone]    Angus Palms 08/19/2022, 4:59 PM

## 2022-08-19 NOTE — Assessment & Plan Note (Addendum)
I have reviewed old records, no significant elevation in blood pressure at outpatient visits.  Will order a home blood pressure monitor, patient will benefit from ARB if blood pressure tolerates.

## 2022-08-19 NOTE — Discharge Summary (Addendum)
Physician Discharge Summary   Patient: Christine Singleton MRN: [id number] DOB: August 24, 1990  Admit date:     08/14/2022  Discharge date: 08/19/22  Discharge Physician: York Ram Lakeeta Dobosz   PCP: Patient, No Pcp Per   Recommendations at discharge:    Patient has been placed on heart failure regimen with spironolactone and SGLT 2 inh.  Will order home blood pressure monitoring.  Holding B blocker and ARB until outpatient follow up. Risk of hypotension.  Loop diuretic as needed.  Nocturnal supplemental 02 per [place], will need referral as outpatient for sleep study. Follow up renal function and electrolytes in 7 days. Follow up with Primary care in 7 to 10 days. Follow up with Cardiology as scheduled.   Discharge Diagnoses: Principal Problem:   Acute on chronic diastolic CHF (congestive heart failure) (HCC) Active Problems:   Essential hypertension   Hyponatremia   Class 3 obesity (HCC)   Iron deficiency anemia   Non-insulin dependent type 2 diabetes mellitus (HCC)  Resolved Problems:   * No resolved hospital problems. Christine Singleton Course: Christine Singleton was admitted to the hospital with the working diagnosis of heart failure exacerbation.   32 yo female with the past medical history of T2DM, and obesity class 3 who presented with dyspnea. She endorsed worsening dyspnea for the last 2 weeks, with edema of her lower extremities, PND and orthopnea. On her initial physical examination her blood pressure was 128/86, HR 111, RR 27 and 02 saturation 67%, lungs with no wheezing, heart with S1 and S2 present and rhythmic, abdomen with no distention and positive lower extremity edema.   Na 130, K 4,6 Cl 99, bicarbonate 24, glucose 96 bun 12 cr 0,74  BNP 409 Wbc 14.6 hgb 11.4 plt 418  D dimer 2,38  High sensitive troponin 27 and 24   Chest radiograph with cardiomegaly, bilateral hilar vascular congestion with no effusions or infiltrates.   CT chest with faint bilateral ground glass opacities,  no evidence of pulmonary embolism (limited evaluation due to poor signal noise ratio).  EKG 111 bpm, normal axis, normal intervals, sinus rhythm with no significant ST segment changes, negative T wave lead III, AvF, V3 and V4.   Patient was placed on furosemide for diuresis.   06/03 volume status is improving. Continue diuresis.  06/04 patient with clinically euvolemic. Plan for follow up as outpatient.   Assessment and Plan: * Acute on chronic diastolic CHF (congestive heart failure) (HCC) Echocardiogram with mild reduction in systolic function 45 to 50%, entire inferior wall and posterior wall are hypokinetic. RV with preserved systolic function. RVSP 28,8 no significant valvular disease.  Flattening of interventricular septum on diastole.   Patient was placed on IV furosemide for diuresis, negative fluid balance was achieved, with significant improvement in her symptoms. She lost 9 kg during this hospitalization.   Patient will continue heart failure management with spironolactone and SGLT 2 inh Hold on B blocker and ARB for now, possible addition as outpatient.  As needed furosemide for signs of volume overload.   Echocardiogram wall motion abnormalities, likely related to hypertension and pulmonary hypertension, related to obesity. Low pre test probability for ischemic disease.  Findings discussed with Cardiology and agreement with continue aggressive medical therapy and life style modifications.  Follow up as outpatient.    Essential hypertension I have reviewed old records, no significant elevation in blood pressure at outpatient visits.  Will order a home blood pressure monitor, patient will benefit from ARB if blood pressure tolerates.  Hyponatremia At the time of her discharge her serum cr is 0,94 with K at 4,3 and serum bicarbonate at 35. Na 136.  Plan to follow up renal function and electrolytes.    Class 3 obesity (HCC) Calculated BMI is 64  Possible OSA,  continue Cpap at night and will need outpatient sleep study.   Iron deficiency anemia Leukocytosis and thrombocytosis.  Serum iron 25, TIBC 554, transferrin saturation 5 and ferritin 20, transferrin 384, consistent with iron deficiency anemia.   Wbc is 15 down from 17, with 10,7 PMN and 1,2 Monocytes.  Plan to give on dose of IV iron and follow up cell count and iron stores as outpatient.    Non-insulin dependent type 2 diabetes mellitus (HCC) Uncontrolled T2Dm with Hgb A1c of 7.3  Add Metformin and encourage life style modifications.           Consultants: none  Procedures performed: none  Disposition: Home Diet recommendation:  Cardiac and Carb modified diet DISCHARGE MEDICATION: Allergies as of 08/19/2022   No Known Allergies      Medication List     TAKE these medications    acetaminophen 325 MG tablet Commonly known as: TYLENOL Take 2 tablets (650 mg total) by mouth every 6 (six) hours as needed for mild pain or moderate pain (or Fever >/= 101).   Blood Pressure Kit Please check blood pressure every morning and keep a log.   furosemide 20 MG tablet Commonly known as: LASIX Take 1 tablet (20 mg total) by mouth daily as needed for edema or fluid (in case of weight gain 2 to 3 lbs in 24 hrs or 5 lbs in 7 days, take until weight back to baseline.).   Jardiance 10 MG Tabs tablet Generic drug: empagliflozin Take 1 tablet (10 mg total) by mouth daily. Start taking on: August 20, 2022   metFORMIN 500 MG tablet Commonly known as: GLUCOPHAGE Take 1 tablet (500 mg total) by mouth daily with breakfast. Start taking on: August 20, 2022   spironolactone 25 MG tablet Commonly known as: ALDACTONE Take 1 tablet (25 mg total) by mouth daily. Start taking on: August 20, 2022   Vitamin D (Ergocalciferol) 1.25 MG (50000 UNIT) Caps capsule Commonly known as: DRISDOL Take 50,000 Units by mouth every 7 (seven) days.               Durable Medical Equipment  (From  admission, onward)           Start     Ordered   08/19/22 1327  For home use only DME 4 wheeled rolling walker with seat  Once       Question:  Patient needs a walker to treat with the following condition  Answer:  Ambulatory dysfunction   08/19/22 1326   08/19/22 1249  For home use only DME Tub bench  Once       Comments: bariatric   08/19/22 1248   08/19/22 1224  For home use only DME oxygen  Once       Question Answer Comment  Length of Need 12 Months   Mode or (Route) Nasal cannula   Liters per Minute 2   Frequency Only at night (stationary unit needed)   Oxygen delivery system Gas      08/19/22 1224   08/19/22 0000  For home use only DME Other see comment       Comments: Home blood pressure monitor  Question:  Length of Need  Answer:  12  Months   08/19/22 1122            Follow-up Information     Shea Stakes, FNP Follow up.   Specialties: Emergency Medicine, General Practice Contact information: [address] [place] [address] [phone]         [facility]. Go in 17 day(s).   Specialty: Cardiology Why: Hospital follow up 09/05/2022 @ 11 am PLEASE bring a current medication list to appointment FREE valet parking, Entrance C, off [address] [employer] information: [address] [id number] mc [place] [address] [phone]               Discharge Exam: Filed Weights   08/17/22 0616 08/18/22 0556 08/19/22 0446  Weight: (!) 155.6 kg (!) 154.3 kg (!) 153.5 kg   BP 127/81 (BP Location: Left Wrist)   Pulse (!) 114   Temp 98 F (36.7 C) (Oral)   Resp 14   Ht 5\' 2"  (1.575 m)   Wt (!) 153.5 kg   SpO2 94%   BMI 61.91 kg/m   Patient with no chest pain or dyspnea. Had back pain worse to touch.   Neurology awake and alert ENT with no pallor Cardiovascular with S1 and S2 present and rhythmic with no gallops, rubs or murmurs No JVD No lower extremity  edema Respirator with no rales or wheezing.  Abdomen with no distention   Condition at discharge: stable  The results of significant diagnostics from this hospitalization (including imaging, microbiology, ancillary and laboratory) are listed below for reference.   Imaging Studies: ECHOCARDIOGRAM COMPLETE  Result Date: 08/15/2022    ECHOCARDIOGRAM REPORT   Patient Name:   LO PAAP Date of Exam: 08/15/2022 Medical Rec #:  [id number]      Height:       62.0 in Accession #:    [id number]     Weight:       357.6 lb Date of Birth:  October 29, 1990     BSA:          2.447 m Patient Age:    31 years       BP:           115/71 mmHg Patient Gender: F              HR:           103 bpm. Exam Location:  Inpatient Procedure: 2D Echo, Cardiac Doppler, Color Doppler and Intracardiac            Opacification Agent Indications:    CHF  History:        Patient has no prior history of Echocardiogram examinations.                 CHF; Risk Factors:Diabetes.  Sonographer:    Lucy Antigua Referring Phys: [id number] TIMOTHY S OPYD IMPRESSIONS  1. Left ventricular ejection fraction, by estimation, is 45 to 50%. The left ventricle has mildly decreased function. The left ventricle demonstrates regional wall motion abnormalities (see scoring diagram/findings for description). Left ventricular diastolic parameters were normal. Elevated left ventricular end-diastolic pressure.  2. Right ventricular systolic function is normal. The right ventricular size is normal. There is normal pulmonary artery systolic pressure.  3. The mitral valve is normal in structure. No evidence of mitral valve regurgitation. No evidence of mitral stenosis.  4. The aortic valve is tricuspid. Aortic valve regurgitation is not visualized. No aortic stenosis is present.  5. The inferior  vena cava is normal in size with greater than 50% respiratory variability, suggesting right atrial pressure of 3 mmHg. FINDINGS  Left Ventricle: Left ventricular ejection fraction,  by estimation, is 45 to 50%. The left ventricle has mildly decreased function. The left ventricle demonstrates regional wall motion abnormalities. The left ventricular internal cavity size was normal in size. There is no left ventricular hypertrophy. Left ventricular diastolic parameters were normal. Elevated left ventricular end-diastolic pressure.  LV Wall Scoring: The entire inferior wall and posterior wall are hypokinetic. The entire anterior wall, antero-lateral wall, entire septum, apical lateral segment, and apex are normal. Right Ventricle: The right ventricular size is normal. No increase in right ventricular wall thickness. Right ventricular systolic function is normal. There is normal pulmonary artery systolic pressure. The tricuspid regurgitant velocity is 2.54 m/s, and  with an assumed right atrial pressure of 3 mmHg, the estimated right ventricular systolic pressure is 28.8 mmHg. Left Atrium: Left atrial size was normal in size. Right Atrium: Right atrial size was normal in size. Pericardium: There is no evidence of pericardial effusion. Mitral Valve: The mitral valve is normal in structure. No evidence of mitral valve regurgitation. No evidence of mitral valve stenosis. Tricuspid Valve: The tricuspid valve is normal in structure. Tricuspid valve regurgitation is trivial. No evidence of tricuspid stenosis. Aortic Valve: The aortic valve is tricuspid. Aortic valve regurgitation is not visualized. No aortic stenosis is present. Aortic valve mean gradient measures 4.0 mmHg. Aortic valve peak gradient measures 7.4 mmHg. Aortic valve area, by VTI measures 2.30 cm. Pulmonic Valve: The pulmonic valve was normal in structure. Pulmonic valve regurgitation is trivial. No evidence of pulmonic stenosis. Aorta: The aortic root is normal in size and structure. Venous: The inferior vena cava is normal in size with greater than 50% respiratory variability, suggesting right atrial pressure of 3 mmHg. IAS/Shunts: No  atrial level shunt detected by color flow Doppler.  LEFT VENTRICLE PLAX 2D LVIDd:         4.40 cm   Diastology LVIDs:         3.30 cm   LV e' medial:    5.22 cm/s LV PW:         1.20 cm   LV E/e' medial:  18.6 LV IVS:        0.80 cm   LV e' lateral:   11.50 cm/s LVOT diam:     1.90 cm   LV E/e' lateral: 8.5 LV SV:         50 LV SV Index:   20 LVOT Area:     2.84 cm  RIGHT VENTRICLE             IVC RV S prime:     15.60 cm/s  IVC diam: 2.00 cm LEFT ATRIUM             Index        RIGHT ATRIUM           Index LA Vol (A2C):   44.1 ml 18.02 ml/m  RA Area:     22.20 cm LA Vol (A4C):   63.9 ml 26.11 ml/m  RA Volume:   63.30 ml  25.87 ml/m LA Biplane Vol: 58.4 ml 23.86 ml/m  AORTIC VALVE AV Area (Vmax):    1.92 cm AV Area (Vmean):   1.83 cm AV Area (VTI):     2.30 cm AV Vmax:           136.00 cm/s AV Vmean:  91.500 cm/s AV VTI:            0.217 m AV Peak Grad:      7.4 mmHg AV Mean Grad:      4.0 mmHg LVOT Vmax:         92.00 cm/s LVOT Vmean:        59.000 cm/s LVOT VTI:          0.176 m LVOT/AV VTI ratio: 0.81  AORTA Ao Root diam: 2.80 cm Ao Asc diam:  2.90 cm MITRAL VALVE               TRICUSPID VALVE MV Area (PHT): 4.08 cm    TR Peak grad:   25.8 mmHg MV Decel Time: 186 msec    TR Vmax:        254.00 cm/s MV E velocity: 97.30 cm/s MV A velocity: 72.40 cm/s  SHUNTS MV E/A ratio:  1.34        Systemic VTI:  0.18 m                            Systemic Diam: 1.90 cm Chilton Si MD Electronically signed by Chilton Si MD Signature Date/Time: 08/15/2022/4:02:31 PM    Final    CT Angio Chest PE W and/or Wo Contrast  Result Date: 08/14/2022 CLINICAL DATA:  Pulmonary embolism (PE) suspected, low to intermediate prob, positive D-dimer EXAM: CT ANGIOGRAPHY CHEST WITH CONTRAST TECHNIQUE: Multidetector CT imaging of the chest was performed using the standard protocol during bolus administration of intravenous contrast. Multiplanar CT image reconstructions and MIPs were obtained to evaluate the vascular  anatomy. RADIATION DOSE REDUCTION: This exam was performed according to the departmental dose-optimization program which includes automated exposure control, adjustment of the mA and/or kV according to patient size and/or use of iterative reconstruction technique. CONTRAST:  OMNIPAQUE IOHEXOL 350 MG/ML SOLN COMPARISON:  None Available. FINDINGS: Limitations: Assessment is limited due to poor signal noise ratio. Cardiovascular: Assessment of the segmental pulmonary arteries limited due to poor visualization. Assessment of the lobar pulmonary arteries is also slightly limited. This limitation no evidence of pulmonary embolism. Normal heart size. No pericardial effusion. Mediastinum/Nodes: No enlarged mediastinal, hilar, or axillary lymph nodes. Thyroid gland, trachea, and esophagus demonstrate no significant findings. Lungs/Pleura: No pleural effusion. No pneumothorax. Linear consolidative opacity in the right middle lobe (series 6, image 132) and in the left perihilar region, favored to represent atelectasis. Upper Abdomen: No acute abnormality. Musculoskeletal: No chest wall abnormality. No acute or significant osseous findings. Review of the MIP images confirms the above findings. IMPRESSION: 1. Assessment of the segmental pulmonary arteries is limited due to poor signal noise ratio. Assessment of the lobar pulmonary arteries is also slightly limited. Within this limitation, no evidence of pulmonary embolism. 2. Linear consolidative opacity in the right middle lobe and in the left perihilar region, favored to represent atelectasis. Electronically Signed   By: Lorenza Cambridge M.D.   On: 08/14/2022 15:09   US Venous Img Lower Unilateral Left  Result Date: 08/14/2022 CLINICAL DATA:  Left leg swelling EXAM: LEFT LOWER EXTREMITY VENOUS DOPPLER ULTRASOUND TECHNIQUE: Gray-scale sonography with graded compression, as well as color Doppler and duplex ultrasound were performed to evaluate the lower extremity deep  venous systems from the level of the common femoral vein and including the common femoral, femoral, profunda femoral, popliteal and calf veins including the posterior tibial, peroneal and gastrocnemius veins when visible. The superficial great saphenous  vein was also interrogated. Spectral Doppler was utilized to evaluate flow at rest and with distal augmentation maneuvers in the common femoral, femoral and popliteal veins. COMPARISON:  None Available. FINDINGS: Contralateral Common Femoral Vein: Respiratory phasicity is normal and symmetric with the symptomatic side. No evidence of thrombus. Normal compressibility. Common Femoral Vein: No evidence of thrombus. Normal compressibility, respiratory phasicity and response to augmentation. Saphenofemoral Junction: No evidence of thrombus. Normal compressibility and flow on color Doppler imaging. Profunda Femoral Vein: No evidence of thrombus. Normal compressibility and flow on color Doppler imaging. Femoral Vein: No evidence of thrombus. Normal compressibility, respiratory phasicity and response to augmentation. Popliteal Vein: No evidence of thrombus. Normal compressibility, respiratory phasicity and response to augmentation. Calf Veins: No evidence of thrombus. Normal compressibility and flow on color Doppler imaging. Superficial Great Saphenous Vein: No evidence of thrombus. Normal compressibility. Venous Reflux:  None. Other Findings:  None. IMPRESSION: No evidence of deep venous thrombosis in the left lower extremity. Electronically Signed   By: Jacob Moores M.D.   On: 08/14/2022 13:40   DG Chest 2 View  Result Date: 08/14/2022 CLINICAL DATA:  Shortness of breath EXAM: CHEST - 2 VIEW COMPARISON:  Chest x-ray March 03, 2018 FINDINGS: Limited assessment due to under penetration and overlying soft tissue. Within this limitation, the cardiomediastinal silhouette is unchanged in contour. No focal pulmonary opacity. No pleural effusion or pneumothorax. The  visualized upper abdomen is unremarkable. No acute osseous abnormality. IMPRESSION: No active cardiopulmonary disease. Electronically Signed   By: Jacob Moores M.D.   On: 08/14/2022 12:22    Microbiology: Results for orders placed or performed in visit on 01/16/16  Culture, beta strep (group b only)     Status: None   Collection Time: 01/16/16  4:06 PM   Specimen: Vaginal/Rectal; Genital  Result Value Ref Range Status   Organism ID, Bacteria NO GROUP B STREP (S.AGALACTIAE) ISOLATED  Final    Labs: CBC: Recent Labs  Lab 08/14/22 1158 08/15/22 0707 08/16/22 0051 08/18/22 0202  WBC 14.6* 15.0* 17.2* 15.1*  NEUTROABS  --   --   --  10.7*  HGB 11.4* 10.8* 11.6* 12.6  HCT 37.6 35.8* 38.9 41.1  MCV 62.6* 63.9* 63.5* 63.5*  PLT 418* 433* 437* 480*   Basic Metabolic Panel: Recent Labs  Lab 08/15/22 0707 08/16/22 0051 08/17/22 0107 08/18/22 0109 08/18/22 1252 08/19/22 0159  NA 137 137 135 133*  --  136  K 3.9 4.0 4.3 4.7 3.8 4.3  CL 92* 93* 91* 88*  --  89*  CO2 30 32 35* 36*  --  35*  GLUCOSE 106* 141* 111* 101*  --  113*  BUN 9 11 11 11   --  15  CREATININE 0.74 0.73 0.85 0.74  --  0.94  CALCIUM 8.9 9.0 9.1 8.8*  --  9.5  MG 1.8 1.8 1.9 2.1  --   --    Liver Function Tests: No results for input(s): "AST", "ALT", "ALKPHOS", "BILITOT", "PROT", "ALBUMIN" in the last 168 hours. CBG: Recent Labs  Lab 08/16/22 0610 08/16/22 1127 08/16/22 1248 08/16/22 1612 08/17/22 1506  GLUCAP 136* 64* 86 129* 116*    Discharge time spent: greater than 30 minutes.  Signed: Coralie Keens, MD [facility] 08/19/2022

## 2022-08-19 NOTE — Plan of Care (Signed)
  Problem: Activity: Goal: Risk for activity intolerance will decrease Outcome: Progressing   Problem: Elimination: Goal: Will not experience complications related to urinary retention Outcome: Progressing   Problem: Safety: Goal: Ability to remain free from injury will improve Outcome: Progressing   

## 2022-08-19 NOTE — Progress Notes (Signed)
Scale at bedside. TOC meds at bedside. Oxygen at bedside. RN and NT assisted patient with getting dressed for d/c. Tele monitor removed and CCMD notified. PIV removed. Transportation has been called. DME is to be delivered to patient's house.

## 2022-08-19 NOTE — TOC Progression Note (Addendum)
Transition of Care [facility]) - Progression Note    Patient Details  Name: Christine Singleton MRN: [id number] Date of Birth: 1990/11/02  Transition of Care [facility]) CM/SW Contact  Leone Haven, RN Phone Number: 08/19/2022, 11:27 AM  Clinical Narrative:    Patient states her Grandmother will transport her home at dc, and she is her support system as well.  She gets medications from [facility] [address] in [place], she does not use any DME at home, she has no HH in place at this time.  Her nocturnal oxygen was checked last pm.  NCM offered choice for DME agency for oxygen, she has no preference.  NCM made referral to [facility] with [employer] for the nocturnal oxygen.  She would like a bariatric tub bench also, NCM informed Jermaine with [employer] and he states this will be delivered to her home address. Per PT she will need a bariatric rollator as well.  [employer] will deliver this to the house as well.        Expected Discharge Plan and Services                                               Social Determinants of Health (SDOH) Interventions SDOH Screenings   Food Insecurity: No Food Insecurity (08/18/2022)  Housing: Patient Declined (08/18/2022)  Transportation Needs: No Transportation Needs (08/18/2022)  Utilities: Not At Risk (08/18/2022)  Alcohol Screen: Low Risk  (08/18/2022)  Financial Resource Strain: Low Risk  (08/18/2022)  Tobacco Use: Low Risk  (08/18/2022)  Recent Concern: Tobacco Use - Medium Risk (08/14/2022)    Readmission Risk Interventions     No data to display

## 2022-08-19 NOTE — Progress Notes (Signed)
Pt Discharge paper and Meds are in the room.Ted hose applied, waiting on Diabetes coordinator. Assigned Nurse will take Tele and IV out closer to time to go home.

## 2022-08-19 NOTE — TOC CM/SW Note (Signed)
CM received request for Unit CM for a scale for patient. She does not have funds. Pt will be follow up in HF [facility] clinic and spoke to HF CSW in clinic. Provided pt with scale for home. Isidoro Donning RN3 CCM, Heart Failure TOC CM [phone]

## 2022-08-19 NOTE — Progress Notes (Signed)
Physical Therapy Treatment Patient Details Name: Christine Singleton MRN: [id number] DOB: 1990-04-24 Today's Date: 08/19/2022   History of Present Illness Pt is a 32 y/o F presenting to ED on 5/30 with shortness of breath, chest CT with bil ground glass opacities, negative for PE, LLE duplex negative for DVT. Admitted for acute on chronic diastolic CHF. No pertinent PMH on file.    PT Comments    Pt received seated EOB, agreeable to therapy session and with good participation and fair tolerance for gait/stair training in the room. Pt standing/gait tolerance limited due to symptomatic orthostatic hypotension, RN/MD notified pt may benefit from trial of knee-high TED hose to see if this helps with hemodynamic stability. Pt also states she does not have a scale at home to perform her daily weights and may need further education on A1c/other health diagnoses per discussion. Pt would benefit from rollator (bariatric) for energy conservation, case mgr notified and discussed with supervising PT Rayfield Citizen B.  Recommendations for follow up therapy are one component of a multi-disciplinary discharge planning process, led by the attending physician.  Recommendations may be updated based on patient status, additional functional criteria and insurance authorization.  Follow Up Recommendations       Assistance Recommended at Discharge PRN  Patient can return home with the following Assistance with cooking/housework;Assist for transportation;Help with stairs or ramp for entrance   Equipment Recommendations  Rollator (4 wheels) (bariatric width)    Recommendations for Other Services       Precautions / Restrictions Precautions Precautions: Fall Precaution Comments: watch O2/BP Restrictions Weight Bearing Restrictions: No     Mobility  Bed Mobility Overal bed mobility: Modified Independent             General bed mobility comments: sitting EOB when PTA arrived, pt defers return to supine at end  of session.    Transfers Overall transfer level: Modified independent Equipment used: None Transfers: Sit to/from Stand Sit to Stand: Modified independent (Device/Increase time)           General transfer comment: no assist needed, pt using BUE support to rise fully and to sit on rollator/EOB surfaces    Ambulation/Gait Ambulation/Gait assistance: Supervision Gait Distance (Feet): 15 Feet (x2 (with seated break)) Assistive device: None Gait Pattern/deviations: Step-through pattern, Decreased stride length, Wide base of support       General Gait Details: Distance limited due to evolving orthostatic symptoms; stayed in room as pt fatigued after performing stair training in room and BP soft, pt lightheaded. Pt awaiting TED hose, recommended she try again after TED hose arrive to ensure BP stability.   Stairs Stairs: Yes Stairs assistance: Min guard Stair Management: One rail Right, One rail Left, Step to pattern, Forwards Number of Stairs: 3 (x2 trials with seated break midway through) General stair comments: pt limited due to evolving lightheadedness (orthostatic BP readings also) and took seated break after 3 steps, then able to perform 3 more. Per pt, she has 6-7 STE home. Currently awaiting TED hose for BLE which will hopefully help with symptoms of lightheadedness, RN ordering them for her. No buckling but pt needed to sit for a couple mins after lightheadedness before continuing.   Wheelchair Mobility    Modified Rankin (Stroke Patients Only)       Balance Overall balance assessment: Mild deficits observed, not formally tested  Cognition Arousal/Alertness: Awake/alert Behavior During Therapy: WFL for tasks assessed/performed Overall Cognitive Status: Within Functional Limits for tasks assessed                                 General Comments: Pt pleasantly cooperative.        Exercises       General Comments General comments (skin integrity, edema, etc.): During discussion she reports getting up frequently at night to use the bathroom, when PTA asked if she is diabetic she states she is not. RN/MD messaged to clarify as per chart review her A1c was elevated in April. Pt may need further education. Pt also reports she does not own a scale, RN/MD notified via secure chat as she will need to do daily weights with new CHF diagnosis.      Pertinent Vitals/Pain Pain Assessment Pain Assessment: Faces Faces Pain Scale: Hurts a little bit Pain Location: L arm "tingly" feeling (pt states she had been sleeping on that side just prior to getting up for a bath) Pain Descriptors / Indicators: Tingling, Discomfort Pain Intervention(s): Monitored during session, Repositioned, Limited activity within patient's tolerance     PT Goals (current goals can now be found in the care plan section) Acute Rehab PT Goals PT Goal Formulation: With patient Time For Goal Achievement: 08/31/22 Progress towards PT goals: Progressing toward goals    Frequency    Min 1X/week      PT Plan Current plan remains appropriate       AM-PAC PT "6 Clicks" Mobility   Outcome Measure  Help needed turning from your back to your side while in a flat bed without using bedrails?: None Help needed moving from lying on your back to sitting on the side of a flat bed without using bedrails?: None Help needed moving to and from a bed to a chair (including a wheelchair)?: None Help needed standing up from a chair using your arms (e.g., wheelchair or bedside chair)?: None Help needed to walk in hospital room?: A Little Help needed climbing 3-5 steps with a railing? : A Little 6 Click Score: 22    End of Session Equipment Utilized During Treatment: Oxygen Activity Tolerance: Treatment limited secondary to medical complications (Comment);Other (comment) (orthostatic hypotension with symptoms limiting standing  tolerance, RN/MD notified.) Patient left: in bed;with call bell/phone within reach;Other (comment) (pt sitting EOB) Nurse Communication: Mobility status;Other (comment) (pt does not have scale at home, she needs further education about A1c/blood sugars, pt may benefit from trial of TED hose to see if it helps her tolerate standing/gait longer) PT Visit Diagnosis: Difficulty in walking, not elsewhere classified (R26.2)     Time: 1610-9604 PT Time Calculation (min) (ACUTE ONLY): 26 min  Charges:  $Gait Training: 8-22 mins                     Tallen Schnorr P., PTA Acute Rehabilitation Services Secure Chat Preferred 9a-5:30pm Office: [phone]    [place]us [place] 08/19/2022, 1:59 PM

## 2022-08-19 NOTE — TOC Transition Note (Signed)
Transition of Care [facility]) - CM/SW Discharge Note   Patient Details  Name: Christine Singleton MRN: [id number] Date of Birth: Apr 23, 1990  [facility] CM/SW Contact:  Leone Haven, RN Phone Number: 08/19/2022, 2:05 PM   Clinical Narrative:     Patient is for dc today, she has transportation, the rollator and the tub bench will be delivered to her house, the oxygen has been delivered to her room by [employer].        Patient Goals and CMS Choice      Discharge Placement                         Discharge Plan and Services Additional resources added to the After Visit Summary for                                       Social Determinants of Health (SDOH) Interventions SDOH Screenings   Food Insecurity: No Food Insecurity (08/18/2022)  Housing: Patient Declined (08/18/2022)  Transportation Needs: No Transportation Needs (08/18/2022)  Utilities: Not At Risk (08/18/2022)  Alcohol Screen: Low Risk  (08/18/2022)  Financial Resource Strain: Low Risk  (08/18/2022)  Tobacco Use: Low Risk  (08/18/2022)  Recent Concern: Tobacco Use - Medium Risk (08/14/2022)     Readmission Risk Interventions     No data to display

## 2022-09-02 ENCOUNTER — Other Ambulatory Visit (HOSPITAL_COMMUNITY): Payer: Self-pay

## 2022-09-02 ENCOUNTER — Ambulatory Visit: Payer: Medicaid Other | Attending: Internal Medicine | Admitting: Internal Medicine

## 2022-09-02 NOTE — Progress Notes (Deleted)
Cardiology Office Note:    Date:  09/02/2022   ID:  Christine Singleton, DOB March 10, 1991, MRN [id number]  PCP:  Patient, No Pcp Per   [facility] Providers Cardiologist:  None { Click to update primary MD,subspecialty MD or APP then REFRESH:1}    Referring MD: Linwood Dibbles, MD   No chief complaint on file. ***  History of Present Illness:    Christine Singleton is a 32 y.o. female with a hx of T2DM, and obesity class 3 , she went to the hospital for dyspnea. O2 satted noted to be 67%. BNP 409. Cxray showed congestion. She was diuresed.   Past Medical History:  Diagnosis Date   Gestational diabetes    With first pregnancy    Past Surgical History:  Procedure Laterality Date   CESAREAN SECTION     CESAREAN SECTION N/A 02/03/2016   Procedure: CESAREAN SECTION;  Surgeon: Lesly Dukes, MD;  Location: [facility] BIRTHING SUITES;  Service: Obstetrics;  Laterality: N/A;    Current Medications: ***  Allergies:   Patient has no known allergies.   Social History   Socioeconomic History   Marital status: Single    Spouse name: Not on file   Number of children: 2   Years of education: Not on file   Highest education level: High school graduate  Occupational History   Occupation: [employer]  Tobacco Use   Smoking status: Never   Smokeless tobacco: Never  Vaping Use   Vaping Use: Never used  Substance and Sexual Activity   Alcohol use: No   Drug use: No    Types: Marijuana    Comment: before admission, smokes everyday   Sexual activity: Yes    Birth control/protection: None  Other Topics Concern   Not on file  Social History Narrative   Not on file   Social Determinants of Health   Financial Resource Strain: Low Risk  (08/18/2022)   Overall Financial Resource Strain (CARDIA)    Difficulty of Paying Living Expenses: Not very hard  Food Insecurity: No Food Insecurity (08/18/2022)   Hunger Vital Sign    Worried About Running Out of Food in the Last Year: Never true    Ran  Out of Food in the Last Year: Never true  Transportation Needs: No Transportation Needs (08/18/2022)   PRAPARE - Administrator, Civil Service (Medical): No    Lack of Transportation (Non-Medical): No  Physical Activity: Not on file  Stress: Not on file  Social Connections: Not on file     Family History: The patient's family history is negative for Cancer, Diabetes, and Hypertension.  ROS:   Please see the history of present illness.     All other systems reviewed and are negative.  EKGs/Labs/Other Studies Reviewed:    The following studies were reviewed today:   TTE 08/15/2022 EF 45-50% Elevated LVEDP RV was nl  No valve dx      Recent Labs: 08/14/2022: B Natriuretic Peptide 409.6 08/18/2022: Hemoglobin 12.6; Magnesium 2.1; Platelets 480 08/19/2022: BUN 15; Creatinine, Ser 0.94; Potassium 4.3; Sodium 136   Recent Lipid Panel No results found for: "CHOL", "TRIG", "HDL", "CHOLHDL", "VLDL", "LDLCALC", "LDLDIRECT"   Risk Assessment/Calculations:    Physical Exam:    VS:  There were no vitals taken for this visit.    Wt Readings from Last 3 Encounters:  08/19/22 (!) 338 lb 8 oz (153.5 kg)  04/22/16 237 lb 12.8 oz (107.9 kg)  01/29/16 256 lb 12 oz (116.5  kg)     GEN: *** Well nourished, well developed in no acute distress HEENT: Normal NECK: No JVD; No carotid bruits LYMPHATICS: No lymphadenopathy CARDIAC: ***RRR, no murmurs, rubs, gallops RESPIRATORY:  Clear to auscultation without rales, wheezing or rhonchi  ABDOMEN: Soft, non-tender, non-distended MUSCULOSKELETAL:  No edema; No deformity  SKIN: Warm and dry NEUROLOGIC:  Alert and oriented x 3 PSYCHIATRIC:  Normal affect   ASSESSMENT:    HFpEF PLAN:    In order of problems listed above:  Referral to healthy weight and wellness Continue lasix Continue SGLT2 and spironoalctone      {Are you ordering a CV Procedure (e.g. stress test, cath, DCCV, TEE, etc)?   Press F2        :[id number]}     Medication Adjustments/Labs and Tests Ordered: Current medicines are reviewed at length with the patient today.  Concerns regarding medicines are outlined above.  No orders of the defined types were placed in this encounter.  No orders of the defined types were placed in this encounter.   There are no Patient Instructions on file for this visit.   Signed, Maisie Fus, MD  09/02/2022 3:43 PM    [facility]

## 2022-09-04 NOTE — Progress Notes (Addendum)
HEART & VASCULAR TRANSITION OF CARE CONSULT NOTE     Referring Physician: Dr. Ella Jubilee Primary Care: Wants to establish at [facility] Primary Cardiologist: N/A  HPI: Referred to clinic by Dr. Ella Jubilee with [facility] for heart failure consultation. Christine Singleton is a 32 y.o. female with history of super morbid obesity, recent diagnosis of DM II, hx leukocytosis, thrombocytosis.   She was admitted 05/30-06/04/24 with acute respiratory failure 2/2 acute diastolic CHF. Had mild troponin elevation, 27>24. BNP 409. Ddimer elevated. CTA chest negative for PE, faint GGO (limited evaluation d/t poor signal noise ratio).  CXR with evidence of CHF. Echo reported as EF 45-50%, entire inferior and posterior walls are HK, RV okay, no significant valvular disease. Patient was diuresed with IV lasix. She was given one dose of IV iron for iron deficiency anemia. Discharged with nocturnal O2.   She was admitted at [facility] 08/20/22 with a/c CHF. She had gained 13 lb rapid weight gain and was eating more sodium. Diuresed with IV lasix and discharged home the next day.   She is here today for hospital follow-up. The patient reports she was not given a prescription for PRN lasix after discharge. She has gained about 10 lb on her home scale since discharge. Notes more dyspnea with exertion and lower extremity edema. She has been taking all of her other medications. Watching salt intake but admits to drinking a lot of fluids. Always thirsty and mouth feels dry.   She's struggled with her weight for a long time. Reports binge eating when she feels anxious or depressed.  She works at [employer]. Reports food insecurity and trouble paying her rent. Has 2 children.  Recently quit smoking. No ETOH or other drug use.   Past Medical History:  Diagnosis Date   Gestational diabetes    With first pregnancy    Current Outpatient Medications  Medication Sig Dispense Refill   acetaminophen (TYLENOL) 325 MG tablet Take 2 tablets (650  mg total) by mouth every 6 (six) hours as needed for mild pain or moderate pain (or Fever >/= 101).     empagliflozin (JARDIANCE) 10 MG TABS tablet Take 1 tablet (10 mg total) by mouth daily. 30 tablet 0   furosemide (LASIX) 20 MG tablet Take 1 tablet (20 mg total) by mouth daily as needed for edema or fluid (in case of weight gain 2 to 3 lbs in 24 hrs or 5 lbs in 7 days, take until weight back to baseline.). 30 tablet 0   metFORMIN (GLUCOPHAGE) 500 MG tablet Take 1 tablet (500 mg total) by mouth daily with breakfast. 30 tablet 0   OXYGEN Inhale 2 application  into the lungs at bedtime.     spironolactone (ALDACTONE) 25 MG tablet Take 1 tablet (25 mg total) by mouth daily. 30 tablet 0   Vitamin D, Ergocalciferol, (DRISDOL) 1.25 MG (50000 UNIT) CAPS capsule Take 50,000 Units by mouth every 7 (seven) days.     Blood Pressure KIT Please check blood pressure every morning and keep a log. (Patient not taking: Reported on 09/05/2022) 1 kit 0   No current facility-administered medications for this encounter.    No Known Allergies    Social History   Socioeconomic History   Marital status: Single    Spouse name: Not on file   Number of children: 2   Years of education: Not on file   Highest education level: High school graduate  Occupational History   Occupation: [employer]  Tobacco Use  Smoking status: Never   Smokeless tobacco: Never  Vaping Use   Vaping Use: Never used  Substance and Sexual Activity   Alcohol use: No   Drug use: No    Types: Marijuana    Comment: before admission, smokes everyday   Sexual activity: Yes    Birth control/protection: None  Other Topics Concern   Not on file  Social History Narrative   Not on file   Social Determinants of Health   Financial Resource Strain: Low Risk  (08/18/2022)   Overall Financial Resource Strain (CARDIA)    Difficulty of Paying Living Expenses: Not very hard  Food Insecurity: No Food Insecurity (08/18/2022)   Hunger Vital Sign     Worried About Running Out of Food in the Last Year: Never true    Ran Out of Food in the Last Year: Never true  Transportation Needs: No Transportation Needs (08/18/2022)   PRAPARE - Administrator, Civil Service (Medical): No    Lack of Transportation (Non-Medical): No  Physical Activity: Not on file  Stress: Not on file  Social Connections: Not on file  Intimate Partner Violence: Not on file      Family History  Problem Relation Age of Onset   Cancer Neg Hx    Diabetes Neg Hx    Hypertension Neg Hx     Vitals:   09/05/22 1052  BP: 100/60  Pulse: (!) 104  SpO2: 94%  Weight: (!) 157.4 kg (347 lb)    PHYSICAL EXAM: General:  Well appearing. Ambulated into clinic. HEENT: normal Neck: supple. JVP difficult d/t thick neck Cor: PMI nondisplaced. Regular rate & rhythm, tachy. No rubs, gallops or murmurs. Lungs: diminished Abdomen: soft, nontender, nondistended.  Extremities: no cyanosis, clubbing, rash, 1+ edema Neuro: alert & oriented x 3. Affect pleasant.  ECG: Sinus tachycardia 105 bpm   ASSESSMENT & PLAN: HFmrEF -Echo 05/24: EF 45-50%, entire inferior and posterior walls are HK, RV okay, no significant valvular disease. On Dr. Nyoka Cowden review: RV dilated and at least moderately reduced. Likely has pulmonary hypertension. -Suspect primarily RV failure d/t suspected OHS/OSA. Using nocturnal O2 currently. Needs CPAP.  -Significant weight loss is imperative GDMT  NYHA III Diuretic-Did not get Rx for lasix at discharge. Volume difficult on exam, but weight up 10 lb and more dyspneic. Start 20 mg lasix daily. BB-No, no BP room. Would be cautious with sinus tach. Ace/ARB/ARNI-Not yet, BP soft MRA-Continue spiro 25 mg daily SGLT2i-Continue Jardiance 10 mg daily Check CMET and BNP today, will need labs at f/u   DM II -A1c 7.1% -Continue Jardiance  Super morbid obesity -Significant weight loss is imperative. This was discussed with her today. Notes history  of binge eating as mechanism to cope with significant depression and anxiety.   -Consider GLP-1A  Probable OSA/OHS -Likely contributing to CHF -currently using nocturnal O2  -Recommend referral to Pulmonary for sleep study  Leukocytosis, thrombocytosis -Recently referred to Hematology/Oncology by PCP  Depression -Scored 25 on PHQ-9 -HF SW assisted with referral to be seen at [facility] today   Referred to HFSW (PCP, Medications, Transportation, ETOH Abuse, Drug Abuse, Insurance, Financial ): Yes, food and housing insecurity. She was provided 2 HF food bags, assisted with rent and utility payments. Arranged urgent PCP referral. Refer to Pharmacy: No Refer to Home Health: No Refer to Advanced Heart Failure Clinic: No Refer to General Cardiology: Yes   Follow up  TOC 1 week to assess volume. Referred to Cardiology for visit  in 4-6 weeks

## 2022-09-05 ENCOUNTER — Other Ambulatory Visit (HOSPITAL_BASED_OUTPATIENT_CLINIC_OR_DEPARTMENT_OTHER): Payer: Self-pay

## 2022-09-05 ENCOUNTER — Encounter (HOSPITAL_COMMUNITY): Payer: Self-pay

## 2022-09-05 ENCOUNTER — Other Ambulatory Visit (HOSPITAL_COMMUNITY): Payer: Self-pay

## 2022-09-05 ENCOUNTER — Ambulatory Visit (HOSPITAL_COMMUNITY)
Admission: EM | Admit: 2022-09-05 | Discharge: 2022-09-05 | Disposition: A | Discharge status: Home / Self Care | Payer: Medicaid Other | Attending: Family | Admitting: Family

## 2022-09-05 ENCOUNTER — Ambulatory Visit (HOSPITAL_COMMUNITY)
Admit: 2022-09-05 | Discharge: 2022-09-05 | Disposition: A | Discharge status: Home / Self Care | Payer: Medicaid Other | Attending: Physician Assistant | Admitting: Physician Assistant

## 2022-09-05 VITALS — BP 100/60 | HR 104 | Wt 347.0 lb

## 2022-09-05 DIAGNOSIS — I5022 Chronic systolic (congestive) heart failure: Secondary | ICD-10-CM | POA: Diagnosis not present

## 2022-09-05 DIAGNOSIS — I509 Heart failure, unspecified: Secondary | ICD-10-CM | POA: Insufficient documentation

## 2022-09-05 DIAGNOSIS — F329 Major depressive disorder, single episode, unspecified: Secondary | ICD-10-CM | POA: Insufficient documentation

## 2022-09-05 DIAGNOSIS — F1010 Alcohol abuse, uncomplicated: Secondary | ICD-10-CM | POA: Diagnosis not present

## 2022-09-05 DIAGNOSIS — E119 Type 2 diabetes mellitus without complications: Secondary | ICD-10-CM | POA: Insufficient documentation

## 2022-09-05 DIAGNOSIS — R000 Tachycardia, unspecified: Secondary | ICD-10-CM | POA: Insufficient documentation

## 2022-09-05 DIAGNOSIS — I5020 Unspecified systolic (congestive) heart failure: Secondary | ICD-10-CM

## 2022-09-05 DIAGNOSIS — F32A Depression, unspecified: Secondary | ICD-10-CM

## 2022-09-05 DIAGNOSIS — R632 Polyphagia: Secondary | ICD-10-CM | POA: Insufficient documentation

## 2022-09-05 DIAGNOSIS — D72829 Elevated white blood cell count, unspecified: Secondary | ICD-10-CM | POA: Insufficient documentation

## 2022-09-05 DIAGNOSIS — D75839 Thrombocytosis, unspecified: Secondary | ICD-10-CM | POA: Diagnosis not present

## 2022-09-05 DIAGNOSIS — I5032 Chronic diastolic (congestive) heart failure: Secondary | ICD-10-CM | POA: Diagnosis not present

## 2022-09-05 DIAGNOSIS — Z79899 Other long term (current) drug therapy: Secondary | ICD-10-CM | POA: Insufficient documentation

## 2022-09-05 DIAGNOSIS — I110 Hypertensive heart disease with heart failure: Secondary | ICD-10-CM | POA: Insufficient documentation

## 2022-09-05 DIAGNOSIS — I5023 Acute on chronic systolic (congestive) heart failure: Secondary | ICD-10-CM | POA: Insufficient documentation

## 2022-09-05 DIAGNOSIS — F411 Generalized anxiety disorder: Secondary | ICD-10-CM | POA: Insufficient documentation

## 2022-09-05 DIAGNOSIS — R634 Abnormal weight loss: Secondary | ICD-10-CM | POA: Insufficient documentation

## 2022-09-05 DIAGNOSIS — G4733 Obstructive sleep apnea (adult) (pediatric): Secondary | ICD-10-CM

## 2022-09-05 DIAGNOSIS — Z7984 Long term (current) use of oral hypoglycemic drugs: Secondary | ICD-10-CM | POA: Insufficient documentation

## 2022-09-05 DIAGNOSIS — F321 Major depressive disorder, single episode, moderate: Secondary | ICD-10-CM

## 2022-09-05 DIAGNOSIS — Z6841 Body Mass Index (BMI) 40.0 and over, adult: Secondary | ICD-10-CM

## 2022-09-05 LAB — COMPREHENSIVE METABOLIC PANEL
ALT: 21 U/L (ref 0–44)
AST: 17 U/L (ref 15–41)
Albumin: 3.5 g/dL (ref 3.5–5.0)
Alkaline Phosphatase: 109 U/L (ref 38–126)
Anion gap: 9 (ref 5–15)
BUN: 9 mg/dL (ref 6–20)
CO2: 29 mmol/L (ref 22–32)
Calcium: 9.4 mg/dL (ref 8.9–10.3)
Chloride: 98 mmol/L (ref 98–111)
Creatinine, Ser: 0.79 mg/dL (ref 0.44–1.00)
GFR, Estimated: >60 mL/min (ref >60)
Glucose, Bld: 90 mg/dL (ref 70–99)
Potassium: 4.4 mmol/L (ref 3.5–5.1)
Sodium: 136 mmol/L (ref 135–145)
Total Bilirubin: 0.7 mg/dL (ref 0.3–1.2)
Total Protein: 7.2 g/dL (ref 6.5–8.1)

## 2022-09-05 LAB — BRAIN NATRIURETIC PEPTIDE: B Natriuretic Peptide: 34.5 pg/mL (ref 0.0–100.0)

## 2022-09-05 MED ORDER — HYDROXYZINE HCL 25 MG PO TABS
25.0000 mg | ORAL_TABLET | Freq: Three times a day (TID) | ORAL | 0 refills | Status: DC | PRN
  Filled 2022-09-05: qty 30, 10d supply, fill #0

## 2022-09-05 MED ORDER — FUROSEMIDE 20 MG PO TABS
20.0000 mg | ORAL_TABLET | Freq: Every day | ORAL | 3 refills | Status: DC
  Filled 2022-09-05 (×2): qty 30, 30d supply, fill #0
  Filled 2023-02-06: qty 30, 30d supply, fill #1
  Filled 2023-03-10 – 2023-04-11 (×2): qty 30, 30d supply, fill #2

## 2022-09-05 MED ORDER — EMPAGLIFLOZIN 10 MG PO TABS
10.0000 mg | ORAL_TABLET | Freq: Every day | ORAL | 3 refills | Status: DC
  Filled 2022-09-05 – 2022-09-24 (×2): qty 30, 30d supply, fill #0
  Filled 2022-10-29: qty 30, 30d supply, fill #1
  Filled 2023-02-06: qty 30, 30d supply, fill #2
  Filled 2023-03-10 – 2023-04-11 (×2): qty 30, 30d supply, fill #3
  Filled 2023-04-20 (×2): qty 30, 30d supply, fill #0

## 2022-09-05 MED ORDER — SPIRONOLACTONE 25 MG PO TABS
25.0000 mg | ORAL_TABLET | Freq: Every day | ORAL | 3 refills | Status: DC
  Filled 2022-09-05 – 2022-10-29 (×2): qty 30, 30d supply, fill #0
  Filled 2023-02-06: qty 30, 30d supply, fill #1
  Filled 2023-03-10 – 2023-04-11 (×2): qty 30, 30d supply, fill #2
  Filled 2023-04-20 (×2): qty 30, 30d supply, fill #0
  Filled 2023-05-27: qty 30, 30d supply, fill #1

## 2022-09-05 NOTE — Patient Instructions (Addendum)
Medication Changes:  START TAKING LASIX (FUROSEMIDE) 20 mg ONCE DAILY   MEDICATIONS REFILLED- SENT TO [facility]   Lab Work:  Labs done today, your results will be available in MyChart, we will contact you for abnormal readings.   Referrals:  TO GENERAL CARDIOLOGY- SOMEONE WILL REACH OUT TO GET YOU SCHEDULED FOR THIS   Follow-Up in: 1 WEEK AS SCHEDULED   At the Advanced Heart Failure Clinic, you and your health needs are our priority. We have a designated team specialized in the treatment of Heart Failure. This Care Team includes your primary Heart Failure Specialized Cardiologist (physician), Advanced Practice Providers (APPs- Physician Assistants and Nurse Practitioners), and Pharmacist who all work together to provide you with the care you need, when you need it.   You may see any of the following providers on your designated Care Team at your next follow up:  Dr. Arvilla Meres Dr. Marca Ancona Dr. Marcos Eke, NP Robbie Lis, [place] [facility] [place], [place] Brynda Peon, NP Karle Plumber, PharmD   Please be sure to bring in all your medications bottles to every appointment.   Need to Contact us:  If you have any questions or concerns before your next appointment please send [place] a message through [place] or call our office at [phone].    TO LEAVE A MESSAGE FOR THE NURSE SELECT OPTION 2, PLEASE LEAVE A MESSAGE INCLUDING: YOUR NAME DATE OF BIRTH CALL BACK NUMBER REASON FOR CALL**this is important as we prioritize the call backs  YOU WILL RECEIVE A CALL BACK THE SAME DAY AS LONG AS YOU CALL BEFORE 4:00 PM

## 2022-09-05 NOTE — Discharge Instructions (Signed)

## 2022-09-05 NOTE — ED Provider Notes (Signed)
Behavioral Health Urgent Care Medical Screening Exam  Patient Name: Christine Singleton MRN: [id number] Date of Evaluation: 09/05/22 Chief Complaint:  " I am very anxious."  Diagnosis:  Final diagnoses:  Current moderate episode of major depressive disorder, unspecified whether recurrent (HCC)  Generalized anxiety disorder    History of Present illness: Christine Singleton is a 32 y.o. female.  Presents to [facility] urgent care reporting increased anxiety and depression symptoms.  States she recently found out that she was diagnosed with heart failure.  States her anxiety is through the roof, " I am always on edge."  Reports her coping mechanism is binge eating however, states due to her recent diagnosis she is no longer able to eat to help with her anxiety.  She denied illicit drug use or substance abuse history. Reported struggling with isolation, depression and anxiety.   Christine Singleton reports she has two children at home.  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  Denied self-harm or injures behaviors.  Denies that she has suicidal ideations.  Denied previous inpatient admissions.  States she would like to follow-up with a therapist and psychiatrist for medication management.  Plan: Patient was escorted to throughput outpatient services.  Education provided with walk-in hours.  Will make hydroxyzine 25 mg p.o. 3 times daily as needed available for increased anxiety patient was receptive to plan.   Christine Singleton is sitting in no acute distress. She is alert/oriented x 4; calm/cooperative; and mood congruent with affect. She is speaking in a clear tone at moderate volume, and normal pace; with good eye contact. Her thought process is coherent and relevant; There is no indication that she is currently responding to internal/external stimuli or experiencing delusional thought content; and she has denied suicidal/self-harm/homicidal ideation, psychosis, and paranoia. Patient has remained calm throughout  assessment and has answered questions appropriately.    At this time Christine Singleton is educated and verbalizes understanding of mental health resources and other crisis services in the community. She is instructed to call 911 and present to the nearest emergency room should she experience any suicidal/homicidal ideation, auditory/visual/hallucinations, or detrimental worsening of her mental health condition. She was a also advised by Clinical research associate that she could call the toll-free phone on insurance card to assist with identifying in network counselors and agencies or number on back of Medicaid card t speak with care coordinator   Flowsheet Row ED from 09/05/2022 in [facility] ED to Hosp-Admission (Discharged) from 08/14/2022 in [facility] 3E HF PCU  C-SSRS RISK CATEGORY No Risk No Risk       Psychiatric Specialty Exam  Presentation  General Appearance:Appropriate for Environment  Eye Contact:Good  Speech:Clear and Coherent  Speech Volume:Normal  Handedness:Right   Mood and Affect  Mood: Anxious; Depressed  Affect: Congruent   Thought Process  Thought Processes: Coherent  Descriptions of Associations:Intact  Orientation:Full (Time, Place and Person)  Thought Content:Logical    Hallucinations:Other (comment)  Ideas of Reference:None  Suicidal Thoughts:No  Homicidal Thoughts:No   Sensorium  Memory: Immediate Good; Recent Good; Remote Good  Judgment: Fair  Insight: Fair   Art therapist  Concentration: Good  Attention Span: Good  Recall: Good  Fund of Knowledge: Fair  Language: Good   Psychomotor Activity  Psychomotor Activity: Normal   Assets  Assets: Desire for Improvement; Social Support   Sleep  Sleep: Fair  Number of hours: 5  Physical Exam: Physical Exam Vitals and nursing note reviewed.  Cardiovascular:     Rate  and Rhythm: Normal rate and regular rhythm.  Pulmonary:     Effort:  Pulmonary effort is normal.     Breath sounds: Normal breath sounds.  Skin:    General: Skin is warm and dry.  Neurological:     Mental Status: She is alert and oriented to person, place, and time.  Psychiatric:        Mood and Affect: Mood normal.        Behavior: Behavior normal.   Review of Systems  Psychiatric/Behavioral:  Positive for depression. The patient is nervous/anxious.   All other systems reviewed and are negative.  Blood pressure 126/79, pulse (!) 110, temperature 98.3 F (36.8 C), temperature source Oral, resp. rate 18, SpO2 94 %. There is no height or weight on file to calculate BMI.  Musculoskeletal: Strength & Muscle Tone: within normal limits Gait & Station: normal Patient leans: N/A   [facility] MSE Discharge Disposition for Follow up and Recommendations: Based on my evaluation the patient does not appear to have an emergency medical condition and can be discharged with resources and follow up care in outpatient services for Medication Management and Individual Therapy   Oneta Rack, NP 09/05/2022, 1:10 PM

## 2022-09-05 NOTE — ED Notes (Signed)
Patient discharged by provider Tanika Lewis, NP with written and verbal instructions. Resources provided.  

## 2022-09-05 NOTE — Progress Notes (Signed)
   09/05/22 1301  [facility] Triage Screening (Walk-ins at [facility] only)  How Did You Hear About [place]? Primary Care  What Is the Reason for Your Visit/Call Today? Pt reports she was recently diagnosed with chronic heart failure and now she is "feeling on the edge". Pt denies SI, HI, AVH and substance use. Pt denies prior suicide attempts. Pt reports the depression is taking over and she is not able to take care of her kids the way she wants to. Pt reports she usually deal with depression by overeating and now that she can not over eat she is isolating. Pt does not have any outpatient services and is seeking treatment.  How Long Has This Been Causing You Problems? 1 wk - 1 month  Have You Recently Had Any Thoughts About Hurting Yourself? No  Are You Planning to Commit Suicide/Harm Yourself At This time? No  Have you Recently Had Thoughts About Hurting Someone Karolee Ohs? No  Are You Planning To Harm Someone At This Time? No  Are you currently experiencing any auditory, visual or other hallucinations? No  Have You Used Any Alcohol or Drugs in the Past 24 Hours? No  Do you have any current medical co-morbidities that require immediate attention? No  Clinician description of patient physical appearance/behavior: depressed  What Do You Feel Would Help You the Most Today? Treatment for Depression or other mood problem  If access to [facility] Urgent Care was not available, would you have sought care in the Emergency Department? No  Determination of Need Routine (7 days)  Options For Referral Medication Management;Outpatient Therapy

## 2022-09-05 NOTE — Progress Notes (Signed)
Heart and Vascular Care Navigation  09/05/2022  Betrice Wanat 1990/07/04 [id number]  Reason for Referral: Patient was seen in HF TOC. Patient is participating in a Managed Medicaid Plan:Yes  Engaged with patient face to face for initial visit for Heart and Vascular Care Coordination.                                                                                                   Assessment:    Patient is a 32 yo female who lives at home with her two children ages 70 yo and 65 yo. Her mother and grandmother recently moved in to assist with her care. She shares that she has been out of wokr due to health reasons  and is struggling financially. She works at the [employer] and just went back to work. Patient was very tearful and shaking her leg constantly during the interview. She shared that she has depression and scored a 25 on the PHQ9 taken during the clinic visit.  She states she needs help "because I am going to die". Patient reports she eats as her coping mechanism and is under a lot of stress. She has no PCP and reports she has been going elsewhere for care until recent hospitalization at [facility].  She felt like "no one was listening to me until now when I came to [facility]". Patient's mother was on the phone during the conversation and appeared very supportive of patient and encouraging.                               HRT/VAS Care Coordination     Patients Home Cardiology Office Heart Failure Clinic  HF [facility]   Outpatient Care Team Social Worker   Social Worker Name: Lasandra Beech, [place] [phone]   Living arrangements for the past 2 months Single Family Home   Lives with: Self; Minor Children  25 yo, 7 yo, Mother and Grandmother   Patient Current Insurance Coverage Medicaid   Patient Has Concern With Paying Medical Bills No   Does Patient Have Prescription Coverage? Yes   Home Assistive Devices/Equipment None       Social History:                                                                              SDOH Screenings   Food Insecurity: Food Insecurity Present (09/05/2022)  Housing: Patient Declined (09/05/2022)  Transportation Needs: No Transportation Needs (08/18/2022)  Utilities: Not At Risk (08/18/2022)  Alcohol Screen: Low Risk  (08/18/2022)  Depression (PHQ2-9): High Risk (09/05/2022)  Financial Resource Strain: Low Risk  (08/18/2022)  Tobacco Use: Low Risk  (09/05/2022)  Recent Concern: Tobacco Use - Medium Risk (08/14/2022)    SDOH Interventions: Financial Resources:  Patient assisted with rental assistance and utility bill through the Patient Care Fund.  Food Insecurity:  Food Insecurity Interventions: Other (Comment) ([employer])  Housing Insecurity:  Housing Interventions: Intervention Not Indicated  Transportation:       Other Care Navigation Interventions:     Inpatient/Outpatient Substance Abuse Counseling/Rehab Options N/a  Provided Pharmacy assistance resources  N/a  Patient expressed Mental Health concerns Yes, Referred to:  [facility]  Patient Referred to: N/a   Follow-up plan:  Patient provided a new PCP appointment with [facility] for next Tuesday 09-09-22 at 2:30 pm. CSW discussed financial assistance through the Patient Care fund to bridge the financial gap due to being out of work. Patient will gather paperwork and return to CSW for completion. Patient will go to the [facility]gent Care for assessment and further follow up for depression. Patient is agreeable to plan and grateful for the assistance. Lasandra Beech, LCSW, CCSW-MCS [phone]

## 2022-09-08 ENCOUNTER — Telehealth (HOSPITAL_COMMUNITY): Payer: Self-pay | Admitting: Licensed Clinical Social Worker

## 2022-09-08 NOTE — Telephone Encounter (Signed)
H&V Care Navigation CSW Progress Note  Clinical Social Worker contacted patient by phone to follow up on visit last week.  Patient is participating in a Managed Medicaid Plan:  Yes  CSW spoke with patient who confirmed she went to [facility] and reports she has a medication to help and feeling much better. CSW reviewed the Patient Care Fund and confirmed the utility bill assistance and awaiting return of W9 and rent invoice. Patient grateful for the assistance and will reach out to CSW when documents obtained. Lasandra Beech, LCSW, CCSW-MCS [phone]   SDOH Screenings   Food Insecurity: Food Insecurity Present (09/05/2022)  Housing: Patient Declined (09/05/2022)  Transportation Needs: No Transportation Needs (08/18/2022)  Utilities: Not At Risk (08/18/2022)  Alcohol Screen: Low Risk  (08/18/2022)  Depression (PHQ2-9): High Risk (09/05/2022)  Financial Resource Strain: High Risk (09/08/2022)  Tobacco Use: Low Risk  (09/05/2022)  Recent Concern: Tobacco Use - Medium Risk (08/14/2022)

## 2022-09-09 ENCOUNTER — Ambulatory Visit (INDEPENDENT_AMBULATORY_CARE_PROVIDER_SITE_OTHER): Payer: Medicaid Other | Admitting: Primary Care

## 2022-09-09 ENCOUNTER — Encounter (INDEPENDENT_AMBULATORY_CARE_PROVIDER_SITE_OTHER): Payer: Self-pay | Admitting: Primary Care

## 2022-09-09 VITALS — BP 104/74 | HR 100 | Resp 16 | Ht 62.0 in | Wt 344.6 lb

## 2022-09-09 DIAGNOSIS — I5022 Chronic systolic (congestive) heart failure: Secondary | ICD-10-CM

## 2022-09-09 DIAGNOSIS — G4733 Obstructive sleep apnea (adult) (pediatric): Secondary | ICD-10-CM | POA: Diagnosis not present

## 2022-09-09 DIAGNOSIS — E119 Type 2 diabetes mellitus without complications: Secondary | ICD-10-CM

## 2022-09-09 DIAGNOSIS — I10 Essential (primary) hypertension: Secondary | ICD-10-CM | POA: Diagnosis not present

## 2022-09-09 DIAGNOSIS — Z6841 Body Mass Index (BMI) 40.0 and over, adult: Secondary | ICD-10-CM | POA: Diagnosis not present

## 2022-09-09 DIAGNOSIS — Z1159 Encounter for screening for other viral diseases: Secondary | ICD-10-CM

## 2022-09-09 DIAGNOSIS — Z7984 Long term (current) use of oral hypoglycemic drugs: Secondary | ICD-10-CM

## 2022-09-09 NOTE — Progress Notes (Signed)
[facility]   Subjective:  [name] is a 32 y.o. female morbid obesity presents establish care. Admit date to the hospital was 08/14/2022 - 08/19/2022 (5 days) dx Acute on chronic diastolic CHF . Cardiology hospital f/u completed. Type 2 Diabetes - she has polyuria, polydipsia, polyphasia and  vision changes (stars/blurring).  Does not check blood sugars at home. Patient has No headache, No chest pain, No abdominal pain - No Nausea, No new weakness tingling or numbness, No Cough - shortness of breath . Snores loudly with witness apnea. Patent has a 2 years history of symptoms of daytime fatigue, morning fatigue, morning headache, and hypertension. Patient generally gets 8 hours of sleep per night, and states they generally have nightime awakenings. Snoring of severe severity is present. Apneic episodes is present. Nasal obstruction is not present.  Patient has had tonsillectomy.   Past Medical History:  Diagnosis Date   Gestational diabetes    With first pregnancy     No Known Allergies    Current Outpatient Medications on File Prior to Visit  Medication Sig Dispense Refill   acetaminophen (TYLENOL) 325 MG tablet Take 2 tablets (650 mg total) by mouth every 6 (six) hours as needed for mild pain or moderate pain (or Fever >/= 101).     empagliflozin (JARDIANCE) 10 MG TABS tablet Take 1 tablet (10 mg total) by mouth daily. 30 tablet 3   furosemide (LASIX) 20 MG tablet Take 1 tablet (20 mg total) by mouth daily. 30 tablet 3   hydrOXYzine (ATARAX) 25 MG tablet Take 1 tablet (25 mg total) by mouth 3 (three) times daily as needed for anxiety. 30 tablet 0   metFORMIN (GLUCOPHAGE) 500 MG tablet Take 1 tablet (500 mg total) by mouth daily with breakfast. 30 tablet 0   spironolactone (ALDACTONE) 25 MG tablet Take 1 tablet (25 mg total) by mouth daily. 30 tablet 3   Vitamin D, Ergocalciferol, (DRISDOL) 1.25 MG (50000 UNIT) CAPS capsule Take 50,000 Units by mouth every 7 (seven)  days.     Blood Pressure KIT Please check blood pressure every morning and keep a log. (Patient not taking: Reported on 09/05/2022) 1 kit 0   OXYGEN Inhale 2 application  into the lungs at bedtime.     No current facility-administered medications on file prior to visit.   Review of System: Comprehensive ROS Pertinent positive and negative noted in HPI    Objective:  Blood Pressure 104/74   Pulse 100   Respiration 16   Height 5\' 2"  (1.575 m)   Weight (Abnormal) 344 lb 9.6 oz (156.3 kg)   Oxygen Saturation 93%   Body Mass Index 63.03 kg/m   Physical Exam: General Appearance: Well nourished, severe morbid obesity in no apparent distress. Eyes: PERRLA, EOMs, conjunctiva no swelling or erythema Sinuses: No Frontal/maxillary tenderness ENT/Mouth: Ext aud canals clear, TMs without erythema, bulging.  Hearing normal.  Neck: Supple, thyroid normal.  Respiratory: Respiratory effort normal, BS equal bilaterally without rales, rhonchi, wheezing or stridor.  Cardio: RRR with no MRGs. Brisk peripheral pulses without edema.  Abdomen: Soft, + BS.  Non tender, no guarding, rebound, hernias, masses. Lymphatics: Non tender without lymphadenopathy.  Musculoskeletal: Full ROM, 5/5 strength, normal gait.  Skin: Warm, dry without rashes, lesions, ecchymosis.  Neuro: Cranial nerves intact. Normal muscle tone, no cerebellar symptoms. Sensation intact.  Psych: Awake and oriented X 3, normal affect, Insight and Judgment appropriate.    Assessment:  Christine Singleton was seen today for hospitalization follow-up.  Diagnoses and all orders for this visit:  OSA (obstructive sleep apnea) -     PSG Sleep Study; Future  Class 3 obesity (HCC) Severe morbid obesity is >40 indicating an excess in caloric intake or underlining conditions.  . Educated on lifestyle modifications of diet and exercise which may reduce obesity.    Essential hypertension 2/2 Chronic systolic heart failure (HCC) Managed by  cardiology   Non-insulin dependent type 2 diabetes mellitus (HCC) She is on Jardiance and metformin with an A1c of 7.1  - educated on lifestyle modifications, including but not limited to diet choices and adding exercise to daily routine.   -     Microalbumin / creatinine urine ratio -     Ambulatory referral to Ophthalmology -     Ambulatory referral to Optometry  Encounter for HCV screening test for low risk patient -     HCV Ab w Reflex to Quant PCR; Future   This note has been created with Education officer, environmental. Any transcriptional errors are unintentional.   Grayce Sessions, NP 09/09/2022, 3:01 PM

## 2022-09-10 LAB — MICROALBUMIN / CREATININE URINE RATIO
Creatinine, Urine: 165.4 mg/dL
Microalb/Creat Ratio: 7 mg/g creat (ref 0–29)
Microalbumin, Urine: 12.3 ug/mL

## 2022-09-16 NOTE — Progress Notes (Signed)
HEART IMPACT TRANSITIONS OF CARE    PCP: Primary Cardiologist:  HPI:  Christine Singleton is a 32 y.o. female with history of super morbid obesity, recent diagnosis of DM II, leukocytosis, thrombocytosis, and HFmEF.    She was admitted 05/30-06/04/24 with acute respiratory failure 2/2 acute diastolic CHF. Had mild troponin elevation, 27>24. BNP 409. Ddimer elevated. CTA chest negative for PE, faint GGO (limited evaluation d/t poor signal noise ratio).  CXR with evidence of CHF. Echo reported as EF 45-50%, entire inferior and posterior walls are HK, RV okay, no significant valvular disease. Patient was diuresed with IV lasix. She was given one dose of IV iron for iron deficiency anemia. Discharged with nocturnal O2.    She was admitted at [facility] 08/20/22 with a/c CHF. She had gained 13 lb rapid weight gain and was eating more sodium. Diuresed with IV lasix and discharged home the next day.   She was seen in HF [facility] clinic 09/05/22. Started on lasix 20 mg daily. Referred to [facility].   Overall feeling fine. Denies SOB/PND/Orthopnea. Appetite ok. No fever or chills. Weight at home 170 pounds. Taking all medications    Cardiac Test Echo 08/15/2022 1. Left ventricular ejection fraction, by estimation, is 45 to 50%. The  left ventricle has mildly decreased function. The left ventricle  demonstrates regional wall motion abnormalities (see scoring  diagram/findings for description). Left ventricular  diastolic parameters were normal. Elevated left ventricular end-diastolic  pressure.   2. Right ventricular systolic function is normal. The right ventricular  size is normal. There is normal pulmonary artery systolic pressure.   3. The mitral valve is normal in structure. No evidence of mitral valve  regurgitation. No evidence of mitral stenosis.   4. The aortic valve is tricuspid. Aortic valve regurgitation is not  visualized. No aortic stenosis is present.    ROS: All systems negative  except as listed in HPI, PMH and Problem List.  SH:  Social History   Socioeconomic History   Marital status: Single    Spouse name: Not on file   Number of children: 2   Years of education: Not on file   Highest education level: High school graduate  Occupational History   Occupation: [employer]  Tobacco Use   Smoking status: Never   Smokeless tobacco: Never  Vaping Use   Vaping Use: Never used  Substance and Sexual Activity   Alcohol use: No   Drug use: No    Types: Marijuana    Comment: before admission, smokes everyday   Sexual activity: Yes    Birth control/protection: None  Other Topics Concern   Not on file  Social History Narrative   Not on file   Social Determinants of Health   Financial Resource Strain: High Risk (09/08/2022)   Overall Financial Resource Strain (CARDIA)    Difficulty of Paying Living Expenses: Hard  Food Insecurity: Food Insecurity Present (09/05/2022)   Hunger Vital Sign    Worried About Running Out of Food in the Last Year: Sometimes true    Ran Out of Food in the Last Year: Sometimes true  Transportation Needs: No Transportation Needs (08/18/2022)   PRAPARE - Administrator, Civil Service (Medical): No    Lack of Transportation (Non-Medical): No  Physical Activity: Not on file  Stress: Not on file  Social Connections: Not on file  Intimate Partner Violence: Not on file    FH:  Family History  Problem Relation Age of Onset  Cancer Neg Hx    Diabetes Neg Hx    Hypertension Neg Hx     Past Medical History:  Diagnosis Date   Gestational diabetes    With first pregnancy    Current Outpatient Medications  Medication Sig Dispense Refill   acetaminophen (TYLENOL) 325 MG tablet Take 2 tablets (650 mg total) by mouth every 6 (six) hours as needed for mild pain or moderate pain (or Fever >/= 101).     Blood Pressure KIT Please check blood pressure every morning and keep a log. (Patient not taking: Reported on 09/05/2022) 1 kit  0   empagliflozin (JARDIANCE) 10 MG TABS tablet Take 1 tablet (10 mg total) by mouth daily. 30 tablet 3   furosemide (LASIX) 20 MG tablet Take 1 tablet (20 mg total) by mouth daily. 30 tablet 3   hydrOXYzine (ATARAX) 25 MG tablet Take 1 tablet (25 mg total) by mouth 3 (three) times daily as needed for anxiety. 30 tablet 0   metFORMIN (GLUCOPHAGE) 500 MG tablet Take 1 tablet (500 mg total) by mouth daily with breakfast. 30 tablet 0   OXYGEN Inhale 2 application  into the lungs at bedtime.     spironolactone (ALDACTONE) 25 MG tablet Take 1 tablet (25 mg total) by mouth daily. 30 tablet 3   Vitamin D, Ergocalciferol, (DRISDOL) 1.25 MG (50000 UNIT) CAPS capsule Take 50,000 Units by mouth every 7 (seven) days.     No current facility-administered medications for this visit.    There were no vitals filed for this visit.  Wt Readings from Last 3 Encounters:  09/09/22 (!) 156.3 kg (344 lb 9.6 oz)  09/05/22 (!) 157.4 kg (347 lb)  08/19/22 (!) 153.5 kg (338 lb 8 oz)     PHYSICAL EXAM:  General:  Well appearing. No resp difficulty HEENT: normal Neck: supple. JVP flat. Carotids 2+ bilaterally; no bruits. No lymphadenopathy or thryomegaly appreciated. Cor: PMI normal. Regular rate & rhythm. No rubs, gallops or murmurs. Lungs: clear Abdomen: soft, nontender, nondistended. No hepatosplenomegaly. No bruits or masses. Good bowel sounds. Extremities: no cyanosis, clubbing, rash, edema Neuro: alert & orientedx3, cranial nerves grossly intact. Moves all 4 extremities w/o difficulty. Affect pleasant.   ECG:   ASSESSMENT & PLAN: 1. HFmrEF -Echo 05/24: EF 45-50%, entire inferior and posterior walls are HK, RV okay, no significant valvular disease. On Dr. Nyoka Cowden review: RV dilated and at least moderately reduced. Likely has pulmonary hypertension. -Suspect primarily RV failure d/t suspected OHS/OSA. Using nocturnal O2 currently. Needs CPAP.  NYHA  GDMT  Diuretic: Continue lasix  20 mg lasix  daily. BB-No, no BP room. Would be cautious with sinus tach. Ace/ARB/ARNI- MRA-Continue spiro 25 mg daily SGLT2i-Continue Jardiance 10 mg daily    2. DM II -A1c 7.1% -Continue Jardiance   3. Super morbid obesity -There is no height or weight on file to calculate BMI.   -Consider GLP-1A   4. Probable OSA/OHS -Likely contributing to CHF -currently using nocturnal O2  -Recommend referral to Pulmonary for sleep study   5. Leukocytosis, thrombocytosis -Recently referred to Hematology/Oncology by PCP   6. Depression -Scored 25 on PHQ-9 -HF SW assisted with referral to be seen at [facility]          Follow up

## 2022-09-17 ENCOUNTER — Ambulatory Visit (HOSPITAL_COMMUNITY)
Admission: RE | Admit: 2022-09-17 | Discharge: 2022-09-17 | Disposition: A | Discharge status: Home / Self Care | Payer: Medicaid Other | Source: Ambulatory Visit | Attending: Adult Health | Admitting: Adult Health

## 2022-09-17 ENCOUNTER — Encounter (HOSPITAL_COMMUNITY): Payer: Self-pay

## 2022-09-17 VITALS — BP 108/76 | HR 111 | Ht 62.0 in | Wt 345.4 lb

## 2022-09-17 DIAGNOSIS — D75839 Thrombocytosis, unspecified: Secondary | ICD-10-CM | POA: Diagnosis not present

## 2022-09-17 DIAGNOSIS — F419 Anxiety disorder, unspecified: Secondary | ICD-10-CM | POA: Diagnosis not present

## 2022-09-17 DIAGNOSIS — Z5986 Financial insecurity: Secondary | ICD-10-CM | POA: Insufficient documentation

## 2022-09-17 DIAGNOSIS — Z6841 Body Mass Index (BMI) 40.0 and over, adult: Secondary | ICD-10-CM | POA: Diagnosis not present

## 2022-09-17 DIAGNOSIS — I5022 Chronic systolic (congestive) heart failure: Secondary | ICD-10-CM | POA: Insufficient documentation

## 2022-09-17 DIAGNOSIS — E119 Type 2 diabetes mellitus without complications: Secondary | ICD-10-CM | POA: Insufficient documentation

## 2022-09-17 DIAGNOSIS — Z79899 Other long term (current) drug therapy: Secondary | ICD-10-CM | POA: Insufficient documentation

## 2022-09-17 DIAGNOSIS — J9611 Chronic respiratory failure with hypoxia: Secondary | ICD-10-CM

## 2022-09-17 DIAGNOSIS — Z7984 Long term (current) use of oral hypoglycemic drugs: Secondary | ICD-10-CM | POA: Diagnosis not present

## 2022-09-17 DIAGNOSIS — D72829 Elevated white blood cell count, unspecified: Secondary | ICD-10-CM | POA: Diagnosis not present

## 2022-09-17 DIAGNOSIS — F32A Depression, unspecified: Secondary | ICD-10-CM | POA: Insufficient documentation

## 2022-09-17 DIAGNOSIS — D509 Iron deficiency anemia, unspecified: Secondary | ICD-10-CM | POA: Diagnosis not present

## 2022-09-17 LAB — BASIC METABOLIC PANEL
Anion gap: 9 (ref 5–15)
BUN: 12 mg/dL (ref 6–20)
CO2: 29 mmol/L (ref 22–32)
Calcium: 8.8 mg/dL — ABNORMAL LOW (ref 8.9–10.3)
Chloride: 96 mmol/L — ABNORMAL LOW (ref 98–111)
Creatinine, Ser: 0.8 mg/dL (ref 0.44–1.00)
GFR, Estimated: >60 mL/min (ref >60)
Glucose, Bld: 134 mg/dL — ABNORMAL HIGH (ref 70–99)
Potassium: 4.1 mmol/L (ref 3.5–5.1)
Sodium: 134 mmol/L — ABNORMAL LOW (ref 135–145)

## 2022-09-17 NOTE — Patient Instructions (Addendum)
Thank you for your visit today.  Labs done today, your results will be available in MyChart, we will contact you for abnormal readings.  Thank you for allowing [place] to provider your heart failure care after your recent hospitalization. Please follow-up with Dr. Wyline Mood.  If you have any questions, issues, or concerns before your next appointment please call our office at [phone], opt. 2 and leave a message for the triage nurse.

## 2022-09-17 NOTE — Progress Notes (Signed)
H&V Care Navigation CSW Progress Note  Clinical Social Worker met with patient to follow up on resources provided at last visit.  Patient is participating in a Managed Medicaid Plan:  Yes  CSW met with patient in the clinic to follow up on last visit. Patient states she followed immediately after HF clinic appointment to [facility] for evaluation as instructed. She states she felt much better and feels as though her mental health is getting addressed. She also followed up with the PCP appointment at [facility] and reports she really liked Christine Singleton who is now her new PCP. CSW discussed financial assistance for utility bill and rent through the Patient Care Fund and she is very grateful for the financial relief. Patient appeared in great spirits and states she is feeling much better and hopeful for the future. Her mother and grandmother have moved in to assist with the finances and support as well. Patient denies any other concerns at this time.CSW available as needed. Christine Beech, LCSW, CCSW-MCS [phone]   SDOH Screenings   Food Insecurity: Food Insecurity Present (09/05/2022)  Housing: Patient Declined (09/05/2022)  Transportation Needs: No Transportation Needs (08/18/2022)  Utilities: Not At Risk (08/18/2022)  Alcohol Screen: Low Risk  (08/18/2022)  Depression (PHQ2-9): High Risk (09/05/2022)  Financial Resource Strain: High Risk (09/08/2022)  Tobacco Use: Low Risk  (09/17/2022)  Recent Concern: Tobacco Use - Medium Risk (08/14/2022)

## 2022-09-17 NOTE — Telephone Encounter (Signed)
Reached out to [place] in regards to form Requested form be refaxed only because received 1 page out of 3.  Received all 3 pages of fax and will give to provider

## 2022-09-17 NOTE — Telephone Encounter (Signed)
Copied from CRM [id number]. Topic: General - Other >> Sep 17, 2022 11:57 AM Dondra Prader A wrote: Reason for CRM: Lasandra Beech states that she will be faxing over a form for Gwinda Passe to fill out. The form will need to be faxed back, it is a request change in PCP form [facility]. Pt seen Gwinda Passe on 09/09/2022 and want to keep seeing Gwinda Passe. Lasandra Beech is wanting to thank Marcelino Duster for the care that she gave the pt, and the pt could not stop saying wonderful things about Gwinda Passe when she seen her this morning.

## 2022-09-19 ENCOUNTER — Other Ambulatory Visit (HOSPITAL_BASED_OUTPATIENT_CLINIC_OR_DEPARTMENT_OTHER): Payer: Self-pay

## 2022-09-19 ENCOUNTER — Telehealth: Payer: Self-pay

## 2022-09-19 ENCOUNTER — Other Ambulatory Visit: Payer: Self-pay | Admitting: Pharmacist

## 2022-09-19 MED ORDER — OZEMPIC (0.25 OR 0.5 MG/DOSE) 2 MG/3ML ~~LOC~~ SOPN
0.2500 mg | PEN_INJECTOR | SUBCUTANEOUS | 1 refills | Status: DC
  Filled 2022-09-19 – 2022-09-26 (×2): qty 3, 42d supply, fill #0

## 2022-09-19 NOTE — Telephone Encounter (Signed)
A PRIOR AUTHORIZATION REQUEST FOR OZEMPIC WAS SUBMITTED TO INSURANCE TODAY VIA [employer] Key: [id number]

## 2022-09-22 ENCOUNTER — Other Ambulatory Visit (HOSPITAL_BASED_OUTPATIENT_CLINIC_OR_DEPARTMENT_OTHER): Payer: Self-pay

## 2022-09-22 ENCOUNTER — Other Ambulatory Visit: Payer: Self-pay

## 2022-09-23 ENCOUNTER — Other Ambulatory Visit: Payer: Self-pay

## 2022-09-23 ENCOUNTER — Telehealth: Payer: Self-pay

## 2022-09-23 ENCOUNTER — Other Ambulatory Visit (INDEPENDENT_AMBULATORY_CARE_PROVIDER_SITE_OTHER): Payer: Self-pay | Admitting: Primary Care

## 2022-09-23 NOTE — Telephone Encounter (Signed)
Requested medication (s) are due for refill today: Metformin yes  Requested medication (s) are on the active medication list: yes both    Last refill: Metformin 08/19/22 #30  0 refills   Jardiance  09/05/22  #30  3 refills  Future visit scheduled 10/20/22 with Pharmacist.  Notes to clinic: Both meds historical provider.   Jardiance, cannot refuse non- delegated meds per protocol.   Requested Prescriptions  Pending Prescriptions Disp Refills   metFORMIN (GLUCOPHAGE) 500 MG tablet 30 tablet 0    Sig: Take 1 tablet (500 mg total) by mouth daily with breakfast.     Endocrinology:  Diabetes - Biguanides Failed - 09/23/2022  2:16 PM      Failed - B12 Level in normal range and within 720 days    No results found for: "VITAMINB12"       Passed - Cr in normal range and within 360 days    Creatinine, Ser  Date Value Ref Range Status  09/17/2022 0.80 0.44 - 1.00 mg/dL Final   Creatinine, Urine  Date Value Ref Range Status  07/31/2015 415.50 >20.0 mg/dL Final    Comment:    Result repeated and verified. Result confirmed by automatic dilution.          Passed - HBA1C is between 0 and 7.9 and within 180 days    Hgb A1c MFr Bld  Date Value Ref Range Status  08/18/2022 7.1 (H) 4.8 - 5.6 % Final    Comment:    (NOTE)         Prediabetes: 5.7 - 6.4         Diabetes: >6.4         Glycemic control for adults with diabetes: <7.0          Passed - eGFR in normal range and within 360 days    GFR calc Af Amer  Date Value Ref Range Status  06/19/2015 >60 >60 mL/min Final    Comment:    (NOTE) The eGFR has been calculated using the CKD EPI equation. This calculation has not been validated in all clinical situations. eGFR's persistently <60 mL/min signify possible Chronic Kidney Disease.    GFR, Estimated  Date Value Ref Range Status  09/17/2022 >60 >60 mL/min Final    Comment:    (NOTE) Calculated using the CKD-EPI Creatinine Equation (2021)          Passed - Valid encounter within  last 6 months    Recent Outpatient Visits           2 weeks ago OSA (obstructive sleep apnea)   [place] [facility] Grayce Sessions, NP       Future Appointments             In 3 weeks Lois Huxley, Cornelius Moras, RPH-CPP [facility]   In 1 month Branch, Alben Spittle, MD  HeartCare at [facility]            Passed - CBC within normal limits and completed in the last 12 months    WBC  Date Value Ref Range Status  08/18/2022 15.1 (H) 4.0 - 10.5 K/uL Final   RBC  Date Value Ref Range Status  08/18/2022 6.47 (H) 3.87 - 5.11 MIL/uL Final   Hemoglobin  Date Value Ref Range Status  08/18/2022 12.6 12.0 - 15.0 g/dL Final   Hemoglobin Other  Date Value Ref Range Status  07/31/2015 31.1 % Final  Comment:    Abnormal hemoglobin detected. Reflexed for further evaluation. Report to follow.   Reviewed by Dallas Breeding, MD, PhD, FCAP (Electronic Signature on File)    HCT  Date Value Ref Range Status  08/18/2022 41.1 36.0 - 46.0 % Final   MCHC  Date Value Ref Range Status  08/18/2022 30.7 30.0 - 36.0 g/dL Final   [facility]  Date Value Ref Range Status  08/18/2022 19.5 (L) 26.0 - 34.0 pg Final   MCV  Date Value Ref Range Status  08/18/2022 63.5 (L) 80.0 - 100.0 fL Final   No results found for: "PLTCOUNTKUC", "LABPLAT", "POCPLA" RDW  Date Value Ref Range Status  08/18/2022 21.7 (H) 11.5 - 15.5 % Final          empagliflozin (JARDIANCE) 10 MG TABS tablet 30 tablet 3    Sig: Take 1 tablet (10 mg total) by mouth daily.     Endocrinology:  Diabetes - SGLT2 Inhibitors Passed - 09/23/2022  2:16 PM      Passed - Cr in normal range and within 360 days    Creatinine, Ser  Date Value Ref Range Status  09/17/2022 0.80 0.44 - 1.00 mg/dL Final   Creatinine, Urine  Date Value Ref Range Status  07/31/2015 415.50 >20.0 mg/dL Final    Comment:    Result repeated and verified. Result confirmed by automatic  dilution.          Passed - HBA1C is between 0 and 7.9 and within 180 days    Hgb A1c MFr Bld  Date Value Ref Range Status  08/18/2022 7.1 (H) 4.8 - 5.6 % Final    Comment:    (NOTE)         Prediabetes: 5.7 - 6.4         Diabetes: >6.4         Glycemic control for adults with diabetes: <7.0          Passed - eGFR in normal range and within 360 days    GFR calc Af Amer  Date Value Ref Range Status  06/19/2015 >60 >60 mL/min Final    Comment:    (NOTE) The eGFR has been calculated using the CKD EPI equation. This calculation has not been validated in all clinical situations. eGFR's persistently <60 mL/min signify possible Chronic Kidney Disease.    GFR, Estimated  Date Value Ref Range Status  09/17/2022 >60 >60 mL/min Final    Comment:    (NOTE) Calculated using the CKD-EPI Creatinine Equation (2021)          Passed - Valid encounter within last 6 months    Recent Outpatient Visits           2 weeks ago OSA (obstructive sleep apnea)   [place] [facility] Grayce Sessions, NP       Future Appointments             In 3 weeks Lois Huxley, Cornelius Moras, RPH-CPP [facility]   In 1 month Branch, Alben Spittle, MD [facility] at [facility]

## 2022-09-23 NOTE — Telephone Encounter (Signed)
Kelly would you be able to look into this  

## 2022-09-23 NOTE — Telephone Encounter (Unsigned)
Copied from CRM [id number]. Topic: General - Other >> Sep 23, 2022 12:56 PM Everette C wrote: Reason for CRM: Medication Refill - Medication: Rx #: [id number]  metFORMIN (GLUCOPHAGE) 500 MG tablet [[id number]]    Rx #: [id number]  empagliflozin (JARDIANCE) 10 MG TABS tablet [[id number]]   Has the patient contacted their pharmacy? Yes.   (Agent: If no, request that the patient contact the pharmacy for the refill. If patient does not wish to contact the pharmacy document the reason why and proceed with request.) (Agent: If yes, when and what did the pharmacy advise?)  Preferred Pharmacy (with phone number or street name): [facility] [address], [address] [place] [address] Phone: [phone] Fax: [phone] Hours: M-F 7:30am-6pm   Has the patient been seen for an appointment in the last year OR does the patient have an upcoming appointment? Yes.    Agent: Please be advised that RX refills may take up to 3 business days. We ask that you follow-up with your pharmacy.

## 2022-09-23 NOTE — Telephone Encounter (Signed)
Copied from CRM [id number]. Topic: General - Other >> Sep 23, 2022 12:54 PM Christine Singleton wrote: Reason for CRM: The patient has called to follow up on completion of a previously request prior authorization for their ozempic prescription   The patient has been contacted by their insurance provider and been directed to provide additional information regarding their current prescriptions  Please contact the patient further when possible

## 2022-09-24 ENCOUNTER — Other Ambulatory Visit (HOSPITAL_BASED_OUTPATIENT_CLINIC_OR_DEPARTMENT_OTHER): Payer: Self-pay

## 2022-09-24 ENCOUNTER — Ambulatory Visit (INDEPENDENT_AMBULATORY_CARE_PROVIDER_SITE_OTHER): Payer: Self-pay | Admitting: *Deleted

## 2022-09-24 ENCOUNTER — Other Ambulatory Visit: Payer: Self-pay

## 2022-09-24 ENCOUNTER — Other Ambulatory Visit (INDEPENDENT_AMBULATORY_CARE_PROVIDER_SITE_OTHER): Payer: Self-pay

## 2022-09-24 MED ORDER — METFORMIN HCL 500 MG PO TABS
500.0000 mg | ORAL_TABLET | Freq: Every day | ORAL | 0 refills | Status: DC
  Filled 2022-09-24: qty 30, 30d supply, fill #0

## 2022-09-24 NOTE — Telephone Encounter (Signed)
Message from [place] sent at 09/24/2022 10:08 AM EDT  Summary: med management   Pt stated she has been contacted by her insurance provider and has been directed to call her PCP and ask that we provide her with information regarding her current prescriptions and what she should be currently taking.   Seeking clinical advice.          Call History   Type Contact Phone/Fax User  09/24/2022 10:03 AM EDT Phone (Incoming) Delillo, [place] (Self) [phone] (H) McGill, Alondra   Reason for Disposition  [1] Caller has URGENT medicine question about med that PCP or specialist prescribed AND [2] triager unable to answer question  Answer Assessment - Initial Assessment Questions 1. NAME of MEDICINE: "What medicine(s) are you calling about?"      My insurance company is wanting a list of my current medications.  They called me and told me my doctor needs to send a list to them.    They have denied a new medicine Michelle prescribed for me for weight loss.   They said something about a prior authorization.  I don't know the name of the weight loss drug she prescribed.   It's an injection I'm to do weekly.   That's the one the insurance company has refused.  There was a question about if I was taking metformin or not.    I am taking metformin but I am out of it and the Jardiance.    Can Marcelino Duster give me refills for both of these?           2. QUESTION: "What is your question?" (e.g., double dose of medicine, side effect)     Can Marcelino Duster send a list of my current medications to [employer]?    She did not have a contact number or fax number for [employer].    "They called me on a 1-800 number but they didn't tell me where the list needed to be sent". 3. PRESCRIBER: "Who prescribed the medicine?" Reason: if prescribed by specialist, call should be referred to that group.     Gwinda Passe, NP 4. SYMPTOMS: "Do you have any symptoms?" If Yes, ask: "What symptoms are you having?"   "How bad are the symptoms (e.g., mild, moderate, severe)     N/A 5. PREGNANCY:  "Is there any chance that you are pregnant?" "When was your last menstrual period?"     N/A  Protocols used: Medication Question Call-A-AH

## 2022-09-24 NOTE — Telephone Encounter (Signed)
Tresa Endo are you able to do prior auth for [employer]

## 2022-09-24 NOTE — Telephone Encounter (Signed)
  Chief Complaint: [employer] called pt.   [employer] needing a list of her current medications sent to them. The new weight loss drug Marcelino Duster ordered has been refused.   Needs a prior authorization per pt.    She is out of the metformin and Jardiance. Symptoms: N/A Frequency: N/A Pertinent Negatives: Patient denies N/A Disposition: [] ED /[] Urgent Care (no appt availability in office) / [] Appointment(In office/virtual)/ []  [place] [facility]/ [] Home Care/ [] Refused Recommended Disposition /[] [facility] [facility]/ [x]  Follow-up with PCP Additional Notes: Message sent to Gwinda Passe, NP for follow up for refills and current list of medications to be sent to her insurance company.

## 2022-09-26 ENCOUNTER — Other Ambulatory Visit: Payer: Self-pay

## 2022-09-26 ENCOUNTER — Other Ambulatory Visit (HOSPITAL_BASED_OUTPATIENT_CLINIC_OR_DEPARTMENT_OTHER): Payer: Self-pay

## 2022-09-26 ENCOUNTER — Encounter (HOSPITAL_BASED_OUTPATIENT_CLINIC_OR_DEPARTMENT_OTHER): Payer: Self-pay

## 2022-09-30 ENCOUNTER — Other Ambulatory Visit (HOSPITAL_BASED_OUTPATIENT_CLINIC_OR_DEPARTMENT_OTHER): Payer: Self-pay

## 2022-10-02 ENCOUNTER — Other Ambulatory Visit (HOSPITAL_BASED_OUTPATIENT_CLINIC_OR_DEPARTMENT_OTHER): Payer: Self-pay

## 2022-10-06 ENCOUNTER — Encounter (INDEPENDENT_AMBULATORY_CARE_PROVIDER_SITE_OTHER): Payer: Self-pay

## 2022-10-06 ENCOUNTER — Ambulatory Visit (INDEPENDENT_AMBULATORY_CARE_PROVIDER_SITE_OTHER): Payer: Medicaid Other

## 2022-10-13 ENCOUNTER — Other Ambulatory Visit (HOSPITAL_BASED_OUTPATIENT_CLINIC_OR_DEPARTMENT_OTHER): Payer: Self-pay

## 2022-10-15 ENCOUNTER — Ambulatory Visit (HOSPITAL_COMMUNITY): Payer: Medicaid Other | Admitting: Student

## 2022-10-20 ENCOUNTER — Encounter (INDEPENDENT_AMBULATORY_CARE_PROVIDER_SITE_OTHER): Payer: Self-pay | Admitting: Primary Care

## 2022-10-20 ENCOUNTER — Ambulatory Visit: Payer: Medicaid Other | Admitting: Pharmacist

## 2022-10-24 ENCOUNTER — Ambulatory Visit: Payer: Medicaid Other | Admitting: Internal Medicine

## 2022-10-29 ENCOUNTER — Other Ambulatory Visit (INDEPENDENT_AMBULATORY_CARE_PROVIDER_SITE_OTHER): Payer: Self-pay | Admitting: Primary Care

## 2022-10-29 ENCOUNTER — Other Ambulatory Visit: Payer: Self-pay

## 2022-10-29 ENCOUNTER — Ambulatory Visit (HOSPITAL_COMMUNITY): Payer: Medicaid Other | Admitting: Mental Health

## 2022-10-29 ENCOUNTER — Other Ambulatory Visit (HOSPITAL_BASED_OUTPATIENT_CLINIC_OR_DEPARTMENT_OTHER): Payer: Self-pay

## 2022-10-29 MED ORDER — METFORMIN HCL 500 MG PO TABS
500.0000 mg | ORAL_TABLET | Freq: Every day | ORAL | 1 refills | Status: DC
  Filled 2022-10-29: qty 90, 90d supply, fill #0
  Filled 2023-02-06: qty 90, 90d supply, fill #1

## 2022-10-31 ENCOUNTER — Ambulatory Visit: Payer: Medicaid Other | Admitting: Pharmacist

## 2022-11-04 ENCOUNTER — Ambulatory Visit: Payer: Medicaid Other | Attending: Primary Care | Admitting: Pharmacist

## 2022-11-04 ENCOUNTER — Encounter: Payer: Self-pay | Admitting: Pharmacist

## 2022-11-04 ENCOUNTER — Other Ambulatory Visit (HOSPITAL_BASED_OUTPATIENT_CLINIC_OR_DEPARTMENT_OTHER): Payer: Self-pay

## 2022-11-04 DIAGNOSIS — Z7984 Long term (current) use of oral hypoglycemic drugs: Secondary | ICD-10-CM

## 2022-11-04 DIAGNOSIS — E119 Type 2 diabetes mellitus without complications: Secondary | ICD-10-CM

## 2022-11-04 DIAGNOSIS — Z7985 Long-term (current) use of injectable non-insulin antidiabetic drugs: Secondary | ICD-10-CM

## 2022-11-04 MED ORDER — ACCU-CHEK SOFTCLIX LANCETS MISC
1.0000 | Freq: Every day | 6 refills | Status: DC
Start: 2022-11-04 — End: 2023-10-21
  Filled 2022-11-04: qty 100, 90d supply, fill #0
  Filled 2023-02-06: qty 100, 90d supply, fill #1
  Filled 2023-06-24: qty 100, 90d supply, fill #2
  Filled 2023-10-07: qty 100, 90d supply, fill #3

## 2022-11-04 MED ORDER — ACCU-CHEK GUIDE VI STRP
1.0000 | ORAL_STRIP | Freq: Every day | 6 refills | Status: DC
Start: 2022-11-04 — End: 2023-10-21
  Filled 2022-11-04: qty 100, 90d supply, fill #0

## 2022-11-04 MED ORDER — ACCU-CHEK GUIDE W/DEVICE KIT
PACK | 0 refills | Status: DC
Start: 2022-11-04 — End: 2023-10-21
  Filled 2022-11-04: qty 1, 30d supply, fill #0

## 2022-11-04 NOTE — Progress Notes (Signed)
    S:    PCP: Gwinda Passe  I connected with  [name] on 11/04/22 by a video enabled telemedicine application and verified that I am speaking with the correct person using two identifiers.   I discussed the limitations of evaluation and management by telemedicine. The patient expressed understanding and agreed to proceed.  32 y.o. female who presents for diabetes evaluation, education, and management. Patient is in good spirits.  Patient was referred and last seen by Primary Care Provider, Gwinda Passe 09/09/22, on 09/09/22.   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 Reports being diagnosed with T2DM during that admission. However, I note an A1c of 7.1% ~4 months ago. After further discussion, pt reveals she was dx'd with GDM in 2014. No hx of thyroid cancer. No personal hx of pancreatitis. No CKD or clinical ASCVD history.   Family/Social History:  Fhx: clinical ASCVD  Tobacco: never smoker  Alcohol: none   Current diabetes medications include: Jardiance 10 mg daily, metformin 500 mg daily, Ozempic (not started  Current hypertension medications include: spironolactone 25 mg daily, Lasix 20 mg daily Current hyperlipidemia medications include: none  Patient reports adherence to taking all medications as prescribed.   Insurance coverage: [place] [employer]  Patient reports hypoglycemic events.  Reported home fasting blood sugars: not checking   Reported 2 hour post-meal/random blood sugars: not checking.  Patient reports nocturia (nighttime urination).  Patient reports neuropathy (nerve pain). Patient reports visual changes. Patient denies self foot exams.   Patient reported dietary habits: Eats 2 meals/day -Alternates, lately it's been lunch and dinner   Patient-reported exercise habits:  -None reported   O:  7 day average blood glucose: no GM present today.  No CGM in place.   Lab Results  Component Value Date   HGBA1C 7.1 (H) 08/18/2022    There were no vitals filed for this visit.  Lipid Panel  No results found for: "CHOL", "TRIG", "HDL", "CHOLHDL", "VLDL", "LDLCALC", "LDLDIRECT"  Clinical Atherosclerotic Cardiovascular Disease (ASCVD): No  The ASCVD Risk score (Arnett DK, et al., 2019) failed to calculate for the following reasons:   The 2019 ASCVD risk score is only valid for ages 62 to 39   Patient is participating in a Managed Medicaid Plan: no   A/P: Diabetes longstanding currently above goal. Patient is able to verbalize appropriate hypoglycemia management plan. Medication adherence appears appropriate with PO therapy but she admits that she wanted a better explanation concerning Ozempic before starting it. -Started Ozempic 0.25 mg subcutaneous once weekly for 4 weeks. Then, increase to 0.5 mg subcutaneous weekly thereafter.  -Continued metformin and Jardiance at current doses. -Accu Chek supplies sent.  -Patient educated on purpose, proper use, and potential adverse effects of Ozempic.  -Extensively discussed pathophysiology of diabetes, recommended lifestyle interventions, dietary effects on blood sugar control.  -Counseled on s/sx of and management of hypoglycemia.  -Next A1c anticipated 11/2022.   Written patient instructions provided. Patient verbalized understanding of treatment plan.  Total time counseling over the phone: 30 minutes.    Follow-up:  Pharmacist in 2 months PCP clinic visit in 1 month  Butch Penny, PharmD, [place], CPP Clinical Pharmacist [facility] [phone]

## 2022-12-03 ENCOUNTER — Ambulatory Visit (HOSPITAL_COMMUNITY): Payer: Medicaid Other | Admitting: Mental Health

## 2022-12-03 ENCOUNTER — Encounter (HOSPITAL_COMMUNITY): Payer: Self-pay

## 2022-12-09 ENCOUNTER — Telehealth (INDEPENDENT_AMBULATORY_CARE_PROVIDER_SITE_OTHER): Payer: Self-pay | Admitting: Primary Care

## 2022-12-10 ENCOUNTER — Ambulatory Visit (INDEPENDENT_AMBULATORY_CARE_PROVIDER_SITE_OTHER): Payer: Medicaid Other | Admitting: Primary Care

## 2022-12-15 ENCOUNTER — Ambulatory Visit (INDEPENDENT_AMBULATORY_CARE_PROVIDER_SITE_OTHER): Payer: Medicaid Other | Admitting: Primary Care

## 2022-12-16 ENCOUNTER — Telehealth (INDEPENDENT_AMBULATORY_CARE_PROVIDER_SITE_OTHER): Payer: Medicaid Other | Admitting: Primary Care

## 2022-12-16 DIAGNOSIS — G4733 Obstructive sleep apnea (adult) (pediatric): Secondary | ICD-10-CM

## 2022-12-16 NOTE — Progress Notes (Signed)
[facility]  Virtual Visit   I connected with Christine Singleton, on 12/16/2022 at 3:22 PM through video application or by verified that I am speaking with the correct person using two identifiers.   Consent: I discussed the limitations, risks, security and privacy concerns of performing an evaluation and management service by telephone and the availability of in person appointments. I also discussed with the patient that there may be a patient responsible charge related to this service. The patient expressed understanding and agreed to proceed.   Location of Patient: Home  Location of Provider: [facility]   Persons participating in Telemedicine visit: Kikuye Brownfield Gwinda Passe,  NP   History of Present Illness: Christine Singleton is a 32 year old female having a virtual visit for  questions why she has not had a sleep sleep - previously  noted Snores loudly with witness apnea. Patent has a 2 years history of symptoms of daytime fatigue, morning fatigue, morning headache, and hypertension. Patient generally gets 8 hours of sleep per night, and states they generally have nightime awakenings. Snoring of severe severity is present. Apneic episodes is present. Nasal obstruction is not present.  Patient has had tonsillectomy.  Ordered placed 09/09/22 provided patient with the sleep study number to follow up on appt.   Past Medical History:  Diagnosis Date   Gestational diabetes    With first pregnancy   No Known Allergies  Current Outpatient Medications on File Prior to Visit  Medication Sig Dispense Refill   Accu-Chek Softclix Lancets lancets Use to check blood sugar once daily. 100 each 6   acetaminophen (TYLENOL) 325 MG tablet Take 2 tablets (650 mg total) by mouth every 6 (six) hours as needed for mild pain or moderate pain (or Fever >/= 101).     Blood Glucose Monitoring Suppl (ACCU-CHEK GUIDE) w/Device KIT Use  to check blood sugar once daily. 1 kit 0   Blood Pressure KIT Please check blood pressure every morning and keep a log. (Patient not taking: Reported on 09/05/2022) 1 kit 0   empagliflozin (JARDIANCE) 10 MG TABS tablet Take 1 tablet (10 mg total) by mouth daily. 30 tablet 3   furosemide (LASIX) 20 MG tablet Take 1 tablet (20 mg total) by mouth daily. 30 tablet 3   glucose blood (ACCU-CHEK GUIDE) test strip Use to check blood sugar once daily. 100 each 6   hydrOXYzine (ATARAX) 25 MG tablet Take 1 tablet (25 mg total) by mouth 3 (three) times daily as needed for anxiety. 30 tablet 0   metFORMIN (GLUCOPHAGE) 500 MG tablet Take 1 tablet (500 mg total) by mouth daily with breakfast. 90 tablet 1   OXYGEN Inhale 2 application  into the lungs at bedtime.     Semaglutide,0.25 or 0.5MG /DOS, (OZEMPIC, 0.25 OR 0.5 MG/DOSE,) 2 MG/3ML SOPN Inject 0.25 mg into the skin once a week for 4 weeks. Then, increase to 0.5 mg weekly thereafter. 3 mL 1   spironolactone (ALDACTONE) 25 MG tablet Take 1 tablet (25 mg total) by mouth daily. 30 tablet 3   Vitamin D, Ergocalciferol, (DRISDOL) 1.25 MG (50000 UNIT) CAPS capsule Take 50,000 Units by mouth every 7 (seven) days.     No current facility-administered medications on file prior to visit.    Observations/Objective:   Assessment and Plan: Diagnoses and all orders for this visit:  OSA (obstructive sleep apnea) See HPI    Follow Up Instructions: Prn    I  discussed the assessment and treatment plan with the patient. The patient was provided an opportunity to ask questions and all were answered. The patient agreed with the plan and demonstrated an understanding of the instructions.   The patient was advised to call back or seek an in-person evaluation if the symptoms worsen or if the condition fails to improve as anticipated.     I provided 12 minutes total of non-face-to-face time during this encounter including median intraservice time, reviewing previous  notes, investigations, ordering medications, medical decision making, coordinating care and patient verbalized understanding at the end of the visit.    This note has been created with Education officer, environmental. Any transcriptional errors are unintentional.   Grayce Sessions, NP 12/16/2022, 3:22 PM

## 2023-01-04 NOTE — Progress Notes (Unsigned)
S:    PCP: Gwinda Passe  I connected with  Christine Singleton on 01/04/23 by a video enabled telemedicine application and verified that I am speaking with the correct person using two identifiers.   I discussed the limitations of evaluation and management by telemedicine. The patient expressed understanding and agreed to proceed.  32 y.o. female who presents for diabetes evaluation, education, and management. Patient is in good spirits.  Patient was referred and last seen by Primary Care Provider, Gwinda Passe 09/09/22, on 09/09/22.   Last pharmacist visit on 11/04/22. 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 Reported being diagnosed with T2DM during that admission. However, I noted an A1c of 7.1% ~4 months ago. After further discussion, pt revealed she was dx'd with GDM in 2014. No hx of thyroid cancer. No personal hx of pancreatitis. No CKD or clinical ASCVD history. Patient had not started Ozempic yet because she wanted a better explanation for the medication prior to beginning therapy. Luke discussed medication with her and instructed her to start Ozempic.  Patient was seen by [facility] endo on 9/13. A1c stable at 7.1%. She reported starting on Ozempic and was experiencing nausea and constipation for 1-2 days after injections. Endo planned to switch to [facility] pending PA approval to see if this was better tolerated. Patient was interested in CGM so endo sent in RX for [employer] 3+ supplies and discussed cash price with her.  Today ***   Family/Social History:  Fhx: clinical ASCVD  Tobacco: never smoker  Alcohol: none   Current diabetes medications include: Jardiance 10 mg daily, metformin 500 mg daily, Ozempic/Mounjaro? Current hypertension medications include: spironolactone 25 mg daily, Lasix 20 mg daily Current hyperlipidemia medications include: none  Patient reports adherence to taking all medications as prescribed.   Insurance coverage: [place] Medicaid  Patient  reports hypoglycemic events.  Reported home fasting blood sugars: not checking   Reported 2 hour post-meal/random blood sugars: not checking.  Patient reports nocturia (nighttime urination).  Patient reports neuropathy (nerve pain). Patient reports visual changes. Patient denies self foot exams.   Patient reported dietary habits: Eats 2 meals/day -Alternates, lately it's been lunch and dinner   Patient-reported exercise habits:  -None reported   O:  7 day average blood glucose: no GM present today.  No CGM in place.   Lab Results  Component Value Date   HGBA1C 7.1 (H) 08/18/2022   There were no vitals filed for this visit.  Lipid Panel  No results found for: "CHOL", "TRIG", "HDL", "CHOLHDL", "VLDL", "LDLCALC", "LDLDIRECT"  Clinical Atherosclerotic Cardiovascular Disease (ASCVD): No  The ASCVD Risk score (Arnett DK, et al., 2019) failed to calculate for the following reasons:   The 2019 ASCVD risk score is only valid for ages 3 to 75   Patient is participating in a Managed Medicaid Plan: no   A/P: Diabetes longstanding currently above goal. Patient is able to verbalize appropriate hypoglycemia management plan. Medication adherence appears appropriate with PO therapy but she admits that she wanted a better explanation concerning Ozempic before starting it. -Started Ozempic 0.25 mg subcutaneous once weekly for 4 weeks. Then, increase to 0.5 mg subcutaneous weekly thereafter.  -Continued metformin and Jardiance at current doses. -Accu Chek supplies sent.  -Patient educated on purpose, proper use, and potential adverse effects of Ozempic.  -Extensively discussed pathophysiology of diabetes, recommended lifestyle interventions, dietary effects on blood sugar control.  -Counseled on s/sx of and management of hypoglycemia.  -Next A1c anticipated 11/2022.  Written patient instructions provided. Patient verbalized understanding of treatment plan.  Total time counseling over the  phone: 30 minutes.    Follow-up:  Pharmacist in 2 months PCP clinic visit in 1 month  Jarrett Ables, PharmD PGY-1 Pharmacy Resident

## 2023-01-05 ENCOUNTER — Encounter: Payer: Self-pay | Admitting: Pharmacist

## 2023-01-05 ENCOUNTER — Ambulatory Visit: Payer: Medicaid Other | Attending: Primary Care | Admitting: Pharmacist

## 2023-01-05 DIAGNOSIS — Z7985 Long-term (current) use of injectable non-insulin antidiabetic drugs: Secondary | ICD-10-CM

## 2023-01-05 DIAGNOSIS — E6609 Other obesity due to excess calories: Secondary | ICD-10-CM | POA: Diagnosis not present

## 2023-01-05 DIAGNOSIS — Z7984 Long term (current) use of oral hypoglycemic drugs: Secondary | ICD-10-CM

## 2023-01-07 ENCOUNTER — Ambulatory Visit (HOSPITAL_BASED_OUTPATIENT_CLINIC_OR_DEPARTMENT_OTHER): Payer: Medicaid Other | Attending: Primary Care | Admitting: Internal Medicine

## 2023-01-07 VITALS — Ht 62.0 in | Wt 347.0 lb

## 2023-01-07 DIAGNOSIS — G4733 Obstructive sleep apnea (adult) (pediatric): Secondary | ICD-10-CM | POA: Diagnosis present

## 2023-01-07 DIAGNOSIS — G4736 Sleep related hypoventilation in conditions classified elsewhere: Secondary | ICD-10-CM | POA: Insufficient documentation

## 2023-01-07 DIAGNOSIS — G4731 Primary central sleep apnea: Secondary | ICD-10-CM | POA: Diagnosis not present

## 2023-01-10 DIAGNOSIS — G4733 Obstructive sleep apnea (adult) (pediatric): Secondary | ICD-10-CM | POA: Diagnosis not present

## 2023-01-10 NOTE — Procedures (Signed)
Patient Name: Christine Singleton, Christine Singleton Date: 01/07/2023 Gender: Female D.O.B: [birth year] Age (years): 31 Referring Provider: Grayce Sessions NP Height (inches): 62 Interpreting Physician: Jetty Duhamel MD, ABSM Weight (lbs): 347 RPSGT: Shelah Lewandowsky BMI: 63 MRN: [id number] Neck Size: 15.75  CLINICAL INFORMATION Sleep Study Type: NPSG Indication for sleep study: Diabetes, Fatigue, Hypertension, Morbid Obesity, Parasomnias, Restless Sleep with Limb Movments, Snoring, Witnesses Apnea / Gasping During Sleep  Epworth Sleepiness Score: 15  SLEEP STUDY TECHNIQUE As per the AASM Manual for the Scoring of Sleep and Associated Events v2.3 (April 2016) with a hypopnea requiring 4% desaturations.  The channels recorded and monitored were frontal, central and occipital EEG, electrooculogram (EOG), submentalis EMG (chin), nasal and oral airflow, thoracic and abdominal wall motion, anterior tibialis EMG, snore microphone, electrocardiogram, and pulse oximetry.  MEDICATIONS Medications self-administered by patient taken the night of the study : N/A  SLEEP ARCHITECTURE The study was initiated at 10:37:43 PM and ended at 4:31:18 AM.  Sleep onset time was 16.8 minutes and the sleep efficiency was 87.5%. The total sleep time was 309.3 minutes.  Stage REM latency was 53.0 minutes.  The patient spent 16.6% of the night in stage N1 sleep, 62.2% in stage N2 sleep, 0.0% in stage N3 and 21.2% in REM.  Alpha intrusion was absent.  Supine sleep was 32.65%.  RESPIRATORY PARAMETERS The overall apnea/hypopnea index (AHI) was 141.8 per hour. There were 691 total apneas, including 377 obstructive, 46 central and 268 mixed apneas. There were 40 hypopneas and 0 RERAs.  The AHI during Stage REM sleep was 107.2 per hour.  AHI while supine was 150.9 per hour.  The mean oxygen saturation was 70.6%. The minimum SpO2 during sleep was 51.0%.  moderate snoring was noted during this study.  CARDIAC  DATA The 2 lead EKG demonstrated sinus rhythm. The mean heart rate was 95.4 beats per minute. Other EKG findings include: None.  LEG MOVEMENT DATA The total PLMS were 0 with a resulting PLMS index of 0.0. Associated arousal with leg movement index was 0.0 .  IMPRESSIONS - Severe obstructive sleep apnea occurred during this study (AHI = 141.8/h). - Mild central sleep apnea occurred during this study (CAI = 8.9/h). - Severe oxygen desaturation was noted during this study (Min O2 = 51.0%, Mean 70.6%). Time withO2 saturation 88% or less was 299 minutes. - The patient snored with moderate snoring volume. - No cardiac abnormalities were noted during this study- occasional PVC . - Clinically significant periodic limb movements did not occur during sleep. No significant associated arousals.  DIAGNOSIS - Obstructive Sleep Apnea (G47.33) - Central Sleep Apnea (G47.37) - Nocturnal Hypoxemia (G47.36)  RECOMMENDATIONS - Due to severity of sleep apnea, recommend return for CPAP/ BIPAP titration sleep study. Alternative would be trial of autopap 5-20 cwp, but sleep titration sleep study preferred. - Be careful with alcohol, sedatives and other CNS depressants that may worsen sleep apnea and disrupt normal sleep architecture. - Sleep hygiene should be reviewed to assess factors that may improve sleep quality. - Weight management and regular exercise should be initiated or continued if appropriate.  [Electronically signed] 01/10/2023 02:16 PM  Jetty Duhamel MD, ABSM Diplomate, American Board of Sleep Medicine NPI: [id number]                         Jetty Duhamel Diplomate, American Board of Sleep Medicine  ELECTRONICALLY SIGNED ON:  01/10/2023, 2:11 PM [facility] PH: [phone]  FX: [phone] ACCREDITED BY THE AMERICAN ACADEMY OF SLEEP MEDICINE

## 2023-01-13 ENCOUNTER — Ambulatory Visit: Payer: Medicaid Other | Admitting: Internal Medicine

## 2023-01-13 NOTE — Progress Notes (Deleted)
  Cardiology Office Note:  .   Date:  01/13/2023  ID:  [name], DOB 1990-03-19, MRN [id number] PCP: Grayce Sessions, NP  [facility] Providers Cardiologist:  None { Click to update primary MD,subspecialty MD or APP then REFRESH:1}   History of Present Illness: .   Brailey Lupoli is a 32 y.o. female hx of morbid obesity,   ROS: ***  Studies Reviewed: .        *** Risk Assessment/Calculations:   {Does this patient have ATRIAL FIBRILLATION?:[id number]} No BP recorded.  {Refresh Note OR Click here to enter BP  :1}***       Physical Exam:   VS:  There were no vitals taken for this visit.   Wt Readings from Last 3 Encounters:  01/07/23 (!) 347 lb (157.4 kg)  09/17/22 (!) 345 lb 6.4 oz (156.7 kg)  09/09/22 (!) 344 lb 9.6 oz (156.3 kg)    GEN: Well nourished, well developed in no acute distress NECK: No JVD; No carotid bruits CARDIAC: ***RRR, no murmurs, rubs, gallops RESPIRATORY:  Clear to auscultation without rales, wheezing or rhonchi  ABDOMEN: Soft, non-tender, non-distended EXTREMITIES:  No edema; No deformity   ASSESSMENT AND PLAN: .   Reduced EF -asymptomatic and euvo    {Are you ordering a CV Procedure (e.g. stress test, cath, DCCV, TEE, etc)?   Press F2        :[id number]}  Dispo: ***  Signed, Maisie Fus, MD

## 2023-01-16 NOTE — Telephone Encounter (Signed)
Spoke to pt and she will be present at apt.

## 2023-01-23 ENCOUNTER — Telehealth (INDEPENDENT_AMBULATORY_CARE_PROVIDER_SITE_OTHER): Payer: Self-pay

## 2023-01-23 NOTE — Telephone Encounter (Signed)
Copied from CRM [id number]. Topic: General - Other >> Jan 22, 2023 10:51 AM Macon Large wrote: Reason for CRM: Pt stated that she contacted Dr. Roxy Cedar office to get the results of the sleep study but she was told to contact her pcp. Pt stated that she has been trying to get the results for over a week now. Pt requests call back asap.

## 2023-01-26 ENCOUNTER — Emergency Department (HOSPITAL_BASED_OUTPATIENT_CLINIC_OR_DEPARTMENT_OTHER): Payer: Medicaid Other

## 2023-01-26 ENCOUNTER — Ambulatory Visit (INDEPENDENT_AMBULATORY_CARE_PROVIDER_SITE_OTHER): Payer: Self-pay

## 2023-01-26 ENCOUNTER — Other Ambulatory Visit: Payer: Self-pay

## 2023-01-26 ENCOUNTER — Encounter (HOSPITAL_BASED_OUTPATIENT_CLINIC_OR_DEPARTMENT_OTHER): Payer: Self-pay | Admitting: Emergency Medicine

## 2023-01-26 ENCOUNTER — Inpatient Hospital Stay (HOSPITAL_BASED_OUTPATIENT_CLINIC_OR_DEPARTMENT_OTHER)
Admission: EM | Admit: 2023-01-26 | Discharge: 2023-01-30 | DRG: 176 | Disposition: A | Discharge status: Home / Self Care | Payer: Medicaid Other | Attending: Internal Medicine | Admitting: Internal Medicine

## 2023-01-26 DIAGNOSIS — Z1152 Encounter for screening for COVID-19: Secondary | ICD-10-CM

## 2023-01-26 DIAGNOSIS — J9611 Chronic respiratory failure with hypoxia: Secondary | ICD-10-CM | POA: Diagnosis present

## 2023-01-26 DIAGNOSIS — E876 Hypokalemia: Secondary | ICD-10-CM | POA: Diagnosis present

## 2023-01-26 DIAGNOSIS — I272 Pulmonary hypertension, unspecified: Secondary | ICD-10-CM | POA: Diagnosis present

## 2023-01-26 DIAGNOSIS — E119 Type 2 diabetes mellitus without complications: Secondary | ICD-10-CM | POA: Diagnosis present

## 2023-01-26 DIAGNOSIS — Z6841 Body Mass Index (BMI) 40.0 and over, adult: Secondary | ICD-10-CM

## 2023-01-26 DIAGNOSIS — I5022 Chronic systolic (congestive) heart failure: Secondary | ICD-10-CM | POA: Diagnosis present

## 2023-01-26 DIAGNOSIS — I110 Hypertensive heart disease with heart failure: Secondary | ICD-10-CM | POA: Diagnosis present

## 2023-01-26 DIAGNOSIS — R400 Somnolence: Secondary | ICD-10-CM | POA: Diagnosis present

## 2023-01-26 DIAGNOSIS — E662 Morbid (severe) obesity with alveolar hypoventilation: Secondary | ICD-10-CM | POA: Diagnosis present

## 2023-01-26 DIAGNOSIS — Z79899 Other long term (current) drug therapy: Secondary | ICD-10-CM

## 2023-01-26 DIAGNOSIS — I2699 Other pulmonary embolism without acute cor pulmonale: Principal | ICD-10-CM | POA: Diagnosis present

## 2023-01-26 DIAGNOSIS — Z9981 Dependence on supplemental oxygen: Secondary | ICD-10-CM

## 2023-01-26 DIAGNOSIS — Z7984 Long term (current) use of oral hypoglycemic drugs: Secondary | ICD-10-CM

## 2023-01-26 DIAGNOSIS — R0602 Shortness of breath: Principal | ICD-10-CM

## 2023-01-26 DIAGNOSIS — Z7985 Long-term (current) use of injectable non-insulin antidiabetic drugs: Secondary | ICD-10-CM

## 2023-01-26 LAB — PREGNANCY, URINE: Preg Test, Ur: NEGATIVE

## 2023-01-26 LAB — PROTIME-INR
INR: 1 (ref 0.8–1.2)
Prothrombin Time: 13.1 s (ref 11.4–15.2)

## 2023-01-26 LAB — I-STAT VENOUS BLOOD GAS, ED
Acid-Base Excess: 10 mmol/L — ABNORMAL HIGH (ref 0.0–2.0)
Bicarbonate: 39.1 mmol/L — ABNORMAL HIGH (ref 20.0–28.0)
Calcium, Ion: 1.1 mmol/L — ABNORMAL LOW (ref 1.15–1.40)
HCT: 47 % — ABNORMAL HIGH (ref 36.0–46.0)
Hemoglobin: 16 g/dL — ABNORMAL HIGH (ref 12.0–15.0)
O2 Saturation: 91 %
Patient temperature: 98.3
Potassium: 4 mmol/L (ref 3.5–5.1)
Sodium: 137 mmol/L (ref 135–145)
TCO2: 41 mmol/L — ABNORMAL HIGH (ref 22–32)
pCO2, Ven: 67 mm[Hg] — ABNORMAL HIGH (ref 44–60)
pH, Ven: 7.373 (ref 7.25–7.43)
pO2, Ven: 65 mm[Hg] — ABNORMAL HIGH (ref 32–45)

## 2023-01-26 LAB — BASIC METABOLIC PANEL
Anion gap: 9 (ref 5–15)
BUN: 11 mg/dL (ref 6–20)
CO2: 33 mmol/L — ABNORMAL HIGH (ref 22–32)
Calcium: 8.7 mg/dL — ABNORMAL LOW (ref 8.9–10.3)
Chloride: 93 mmol/L — ABNORMAL LOW (ref 98–111)
Creatinine, Ser: 0.96 mg/dL (ref 0.44–1.00)
GFR, Estimated: >60 mL/min (ref >60)
Glucose, Bld: 175 mg/dL — ABNORMAL HIGH (ref 70–99)
Potassium: 3.4 mmol/L — ABNORMAL LOW (ref 3.5–5.1)
Sodium: 135 mmol/L (ref 135–145)

## 2023-01-26 LAB — CBC
HCT: 43.3 % (ref 36.0–46.0)
Hemoglobin: 13.8 g/dL (ref 12.0–15.0)
MCH: 22.9 pg — ABNORMAL LOW (ref 26.0–34.0)
MCHC: 31.9 g/dL (ref 30.0–36.0)
MCV: 71.8 fL — ABNORMAL LOW (ref 80.0–100.0)
Platelets: 390 10*3/uL (ref 150–400)
RBC: 6.03 MIL/uL — ABNORMAL HIGH (ref 3.87–5.11)
RDW: 23.6 % — ABNORMAL HIGH (ref 11.5–15.5)
WBC: 16.1 10*3/uL — ABNORMAL HIGH (ref 4.0–10.5)
nRBC: 0 % (ref 0.0–0.2)

## 2023-01-26 LAB — TROPONIN I (HIGH SENSITIVITY)
Troponin I (High Sensitivity): 11 ng/L (ref <18)
Troponin I (High Sensitivity): 9 ng/L (ref <18)

## 2023-01-26 LAB — BRAIN NATRIURETIC PEPTIDE: B Natriuretic Peptide: 26.5 pg/mL (ref 0.0–100.0)

## 2023-01-26 MED ORDER — HEPARIN BOLUS VIA INFUSION
5000.0000 [IU] | Freq: Once | INTRAVENOUS | Status: AC
  Administered 2023-01-26: 5000 [IU] via INTRAVENOUS

## 2023-01-26 MED ORDER — IPRATROPIUM-ALBUTEROL 0.5-2.5 (3) MG/3ML IN SOLN
3.0000 mL | Freq: Once | RESPIRATORY_TRACT | Status: DC
  Filled 2023-01-26: qty 3

## 2023-01-26 MED ORDER — FUROSEMIDE 10 MG/ML IJ SOLN
40.0000 mg | Freq: Once | INTRAMUSCULAR | Status: AC
  Administered 2023-01-26: 40 mg via INTRAVENOUS
  Filled 2023-01-26: qty 4

## 2023-01-26 MED ORDER — IOHEXOL 350 MG/ML SOLN
100.0000 mL | Freq: Once | INTRAVENOUS | Status: AC | PRN
  Administered 2023-01-26: 100 mL via INTRAVENOUS

## 2023-01-26 MED ORDER — HEPARIN (PORCINE) 25000 UT/250ML-% IV SOLN
1800.0000 [IU]/h | INTRAVENOUS | Status: DC
  Administered 2023-01-26: 1500 [IU]/h via INTRAVENOUS
  Filled 2023-01-26: qty 250

## 2023-01-26 NOTE — ED Notes (Signed)
Patient transported to CT 

## 2023-01-26 NOTE — Telephone Encounter (Signed)
Will forward to provider  

## 2023-01-26 NOTE — ED Provider Notes (Signed)
Accepted handoff at shift change from Army Melia PA-C. Please see prior provider note for more detail.   Briefly: Patient is 32 y.o.   DDX: concern for Shob 1.5 weeks. Increased lasix with no relief ("been taking 4"). Getting chest xray, CT PE study, dispo pending imaging.  Plan: At time of handoff patient is pending reevaluation, PE study, plain film chest x-ray has some hazy opacity in the right lower lobe, with recommendation for follow-up, studies she has multiple subsegmental PEs noted, no evidence of infiltrate.  She had some somnolence on oxygen but was easily arousable, increased work of breathing, she improved after being placed on BiPAP considerably.  I spoke to the hospitalist, Dr. Lazarus Salines, who after review of lab work, imaging, patient clinical condition agrees to admission for acute pulmonary embolus, acute hypoxic respiratory failure.  We started heparin, on hospitalist request we additionally started duoneb for small airway disease.   .Critical Care  Performed by: Olene Floss, PA-C Authorized by: Olene Floss, PA-C   Critical care provider statement:    Critical care time (minutes):  35   Critical care was necessary to treat or prevent imminent or life-threatening deterioration of the following conditions:  Respiratory failure   Critical care was time spent personally by me on the following activities:  Development of treatment plan with patient or surrogate, discussions with consultants, evaluation of patient's response to treatment, examination of patient, ordering and review of laboratory studies, ordering and review of radiographic studies, ordering and performing treatments and interventions, pulse oximetry, re-evaluation of patient's condition and review of old charts   Care discussed with: admitting provider       RISR  EDTHIS     Olene Floss, PA-C 01/26/23 2337    Charlynne Pander, MD 01/27/23 1451

## 2023-01-26 NOTE — Progress Notes (Signed)
PHARMACY - ANTICOAGULATION CONSULT NOTE  Pharmacy Consult for IV heparin Indication: pulmonary embolus  No Known Allergies  Patient Measurements: Height: 5\' 2"  (157.5 cm) Weight: (!) 157.4 kg (347 lb 0.1 oz) IBW/kg (Calculated) : 50.1 Heparin Dosing Weight: 91.1 kg  Vital Signs: Temp: 98.3 F (36.8 C) (11/11 1939) Temp Source: Oral (11/11 1939) BP: 104/70 (11/11 1939) Pulse Rate: 102 (11/11 1939)  Labs: Recent Labs    01/26/23 1757 01/26/23 2006 01/26/23 2127  HGB 13.8  --  16.0*  HCT 43.3  --  47.0*  PLT 390  --   --   LABPROT 13.1  --   --   INR 1.0  --   --   CREATININE 0.96  --   --   TROPONINIHS 11 9  --     Estimated Creatinine Clearance: 126.9 mL/min (by C-G formula based on SCr of 0.96 mg/dL).   Medical History: Past Medical History:  Diagnosis Date   Gestational diabetes    With first pregnancy    Assessment: Christine Singleton is a 32 y.o. year old female admitted on 01/26/2023 with concern for PE. No anticoagulation prior to admission. Pharmacy consulted to dose heparin.   Goal of Therapy:  Heparin level 0.3-0.7 units/ml Monitor platelets by anticoagulation protocol: Yes   Plan:  Heparin 5000 units x 1 as bolus followed by heparin infusion at 1500 units/hr 6 heparin level  Daily heparin level, CBC, and monitoring for bleeding F/u plans for anticoagulation    Thank you for allowing pharmacy to participate in this patient's care.  Marja Kays, PharmD Emergency Medicine Clinical Pharmacist 01/26/2023,11:35 PM

## 2023-01-26 NOTE — Telephone Encounter (Signed)
  Chief Complaint: facial swelling Symptoms: facial swelling, LE edema, SOB with little activity requiring O2 Frequency: 1 week  Pertinent Negatives: NA Disposition: [x] ED /[] Urgent Care (no appt availability in office) / [] Appointment(In office/virtual)/ []  [place] [facility]/ [] Home Care/ [] Refused Recommended Disposition /[] [place] [facility]/ []  Follow-up with PCP Additional Notes: pt states she has noticed her face being swollen, wears O2 at night and indentions have been more present from swelling, pt states she has tried increased her Furosemide and has increased urine output but not effective on edema. Abdomen feels bloated as well from swelling. Pt wasn't sure if she could increase Furosemide more or if she needed to be seen. She is having to wear O2 during the day d/t SOB with little activity. Called and spoke with Johny Shears, [facility] Marcelino Duster will be OOO today so no appts available. Advised I would send pt to ED for eval. Informed pt of this and she agreed to go to ED for evaluation.   Reason for Disposition  Patient sounds very sick or weak to the triager  Answer Assessment - Initial Assessment Questions 1. ONSET: "When did the swelling start?" (e.g., minutes, hours, days)     1 week  2. LOCATION: "What part of the face is swollen?"     Facial swelling  3. SEVERITY: "How swollen is it?"     Moderate  5. PAIN: "Is the swelling painful to touch?" If Yes, ask: "How painful is it?"   (Scale 1-10; mild, moderate or severe)   - NONE (0): no pain   - MILD (1-3): doesn't interfere with normal activities    - MODERATE (4-7): interferes with normal activities or awakens from sleep    - SEVERE (8-10): excruciating pain, unable to do any normal activities      Not really  7. CAUSE: "What do you think is causing the face swelling?"     CHF 9. OTHER SYMPTOMS: "Do you have any other symptoms?" (e.g., toothache, leg swelling)     LE swelling, SOB with activity, having to wear O2 throughout  day  Protocols used: Face Swelling-A-AH

## 2023-01-26 NOTE — ED Notes (Signed)
   01/26/23 1811  Respiratory Assessment  Assessment Type Assess only  Respiratory Pattern Regular;Dyspnea at rest;Dyspnea with exertion;Symmetrical;Labored  Chest Assessment Chest expansion symmetrical  Cough Non-productive  R Upper  Breath Sounds Diminished  L Upper Breath Sounds Diminished  R Lower Breath Sounds Diminished  L Lower Breath Sounds Diminished  Oxygen Therapy/Pulse Ox  O2 Device Nasal Cannula  O2 Therapy Oxygen humidified  O2 Flow Rate (L/min) 3 L/min  SpO2 98 %   Called to room for SpO2 86% on room air.  Patient on nocturnal O2 @ 3lpm through home concentrator.  Placed #lpm O2 SpO2 now 95%.

## 2023-01-26 NOTE — ED Triage Notes (Signed)
Shortness of breath x 5 days , worsening today . Hx CHF . Add chest pain worse with ambulation .  No relief despite her Lasix .

## 2023-01-26 NOTE — Progress Notes (Addendum)
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 Tachycardic, tachypneic, requiring 3L initially -> later to BiPAP. Workup with VBG 7.37 / 67, bicarb 33. Trop neg, BNP low. WBC 16. EKG nonischemic. Imaging CTA with small clot burden PE in the PA and RUL, no RH strain. Due to worsening WOB / somnolence placed on BiPAP -> per EDP improved on both accounts prior to transfer. Treated with lasix, heparin gtt ordered. Asked for nebs with findings of small airway disease on CT as well, COVID swab. Will need duplex LE, repeat TTE and transition to DOAC   Plan of care: The patient is accepted for admission to Progressive unit, at [facility]..   Author: Dolly Rias, MD 01/26/2023  Check [email or web address] for on-call coverage.  Nursing staff, Please call [facility] Admits & Consults System-Wide number on Amion as soon as patient's arrival, so appropriate admitting provider can evaluate the pt.

## 2023-01-26 NOTE — ED Provider Notes (Signed)
[place] EMERGENCY DEPARTMENT AT [facility] Provider Note   CSN: [id number] Arrival date & time: 01/26/23  1747     History  Chief Complaint  Patient presents with   Shortness of Breath    Christine Singleton is a 32 y.o. female.  32 year old female with history of CHF, HTN, DM presents with SHOB x 1.5 weeks, progressively worsening. SHOB and chest tightness worse with exertion. Denies fevers, chills. Feels like her abdomen is bloated. Has increased in furosemide the past few days and is voiding more often without any improvement in her [facility].        Home Medications Prior to Admission medications   Medication Sig Start Date End Date Taking? Authorizing Provider  Accu-Chek Softclix Lancets lancets Use to check blood sugar once daily. 11/04/22   Hoy Register, MD  acetaminophen (TYLENOL) 325 MG tablet Take 2 tablets (650 mg total) by mouth every 6 (six) hours as needed for mild pain or moderate pain (or Fever >/= 101). 08/19/22   Arrien, York Ram, MD  Blood Glucose Monitoring Suppl (ACCU-CHEK GUIDE) w/Device KIT Use to check blood sugar once daily. 11/04/22   Hoy Register, MD  Blood Pressure KIT Please check blood pressure every morning and keep a log. Patient not taking: Reported on 09/05/2022 08/19/22   Arrien, York Ram, MD  Dulaglutide (TRULICITY) 0.75 MG/0.5ML SOPN Inject 0.75 mg into the skin once a week. 12/14/22   [provider]  empagliflozin (JARDIANCE) 10 MG TABS tablet Take 1 tablet (10 mg total) by mouth daily. 09/05/22   Andrey Farmer, PA-C  furosemide (LASIX) 20 MG tablet Take 1 tablet (20 mg total) by mouth daily. 09/05/22   Andrey Farmer, PA-C  glucose blood (ACCU-CHEK GUIDE) test strip Use to check blood sugar once daily. 11/04/22   Hoy Register, MD  hydrOXYzine (ATARAX) 25 MG tablet Take 1 tablet (25 mg total) by mouth 3 (three) times daily as needed for anxiety. 09/05/22   Oneta Rack, NP  metFORMIN (GLUCOPHAGE)  500 MG tablet Take 1 tablet (500 mg total) by mouth daily with breakfast. 10/29/22   Grayce Sessions, NP  OXYGEN Inhale 2 application  into the lungs at bedtime.    [provider]  spironolactone (ALDACTONE) 25 MG tablet Take 1 tablet (25 mg total) by mouth daily. 09/05/22 12/10/22  Andrey Farmer, PA-C  Vitamin D, Ergocalciferol, (DRISDOL) 1.25 MG (50000 UNIT) CAPS capsule Take 50,000 Units by mouth every 7 (seven) days.    [provider]      Allergies    Patient has no known allergies.    Review of Systems   Review of Systems Negative except as per HPI Physical Exam Updated Vital Signs BP (!) 112/54   Pulse (!) 119   Temp 98.1 F (36.7 C) (Oral)   Resp (!) 28   SpO2 98%  Physical Exam Vitals and nursing note reviewed.  Constitutional:      General: She is not in acute distress.    Appearance: She is well-developed. She is obese. She is not diaphoretic.  HENT:     Head: Normocephalic and atraumatic.  Cardiovascular:     Rate and Rhythm: Regular rhythm. Tachycardia present.  Pulmonary:     Effort: Pulmonary effort is normal.     Breath sounds: Rhonchi present.  Musculoskeletal:     Right lower leg: No tenderness. No edema.     Left lower leg: No tenderness. No edema.  Skin:  General: Skin is warm and dry.  Neurological:     Mental Status: She is alert and oriented to person, place, and time.  Psychiatric:        Behavior: Behavior normal.     ED Results / Procedures / Treatments   Labs (all labs ordered are listed, but only abnormal results are displayed) Labs Reviewed  BASIC METABOLIC PANEL  CBC  PREGNANCY, URINE  PROTIME-INR  BRAIN NATRIURETIC PEPTIDE  TROPONIN I (HIGH SENSITIVITY)    EKG None  Radiology No results found.  Procedures Procedures    Medications Ordered in ED Medications - No data to display  ED Course/ Medical Decision Making/ A&P                                 Medical Decision Making Amount  and/or Complexity of Data Reviewed Labs: ordered. Radiology: ordered.   This patient presents to the ED for concern of SHOB, chest tightness, this involves an extensive number of treatment options, and is a complaint that carries with it a high risk of complications and morbidity.  The differential diagnosis includes but not limited to PE, ACS, bronchitis, PNA, PNX   Co morbidities that complicate the patient evaluation  CHF, HTN, DM. Non smoker   Additional history obtained:  External records from outside source obtained and reviewed including echo dated 08/15/22 with EF 45-50%   Lab Tests:  I Ordered, and personally interpreted labs.  The pertinent results include:  BNP, trop, CBC, BMP, INR- all pending at time of sign out to on coming provider   Imaging Studies ordered:  I ordered imaging studies including CTA PE study  Imaging pending at time of sign out to oncoming provider    Cardiac Monitoring: / EKG:  The patient was maintained on a cardiac monitor.  I personally viewed and interpreted the cardiac monitored which showed an underlying rhythm of: sinus tach, rate 116   Consultations Obtained:  I requested consultation with the ER attending, Dr. Silverio Lay,  and discussed lab and imaging findings as well as pertinent plan - they recommend: agrees with plan of care   Problem List / ED Course / Critical interventions / Medication management  32 year old female with history of diabetes, CHF, HTN, OSA, obesity presents with [facility] and chest tightness, not improving with furosemide despite increase in dose at home. Mildly course lung sounds on exam, somewhat limited due to habitus. No lower extremity edema. Plan is to obtain labs, imaging and reassess.  Care signed out to PA Prosperi at change of shift pending work up.  I have reviewed the patients home medicines and have made adjustments as needed   Social Determinants of Health:  Has PCP   Test / Admission -  Considered:  Disposition pending at time of sign out to oncoming provider         Final Clinical Impression(s) / ED Diagnoses Final diagnoses:  Shortness of breath    Rx / DC Orders ED Discharge Orders     None         Jeannie Fend, PA-C 01/26/23 1833    Charlynne Pander, MD 01/26/23 2312

## 2023-01-27 ENCOUNTER — Encounter (INDEPENDENT_AMBULATORY_CARE_PROVIDER_SITE_OTHER): Payer: Self-pay | Admitting: Primary Care

## 2023-01-27 ENCOUNTER — Inpatient Hospital Stay (HOSPITAL_COMMUNITY): Payer: Medicaid Other

## 2023-01-27 DIAGNOSIS — R0602 Shortness of breath: Secondary | ICD-10-CM | POA: Diagnosis present

## 2023-01-27 DIAGNOSIS — I2699 Other pulmonary embolism without acute cor pulmonale: Secondary | ICD-10-CM | POA: Diagnosis present

## 2023-01-27 DIAGNOSIS — E119 Type 2 diabetes mellitus without complications: Secondary | ICD-10-CM | POA: Diagnosis present

## 2023-01-27 DIAGNOSIS — Z6841 Body Mass Index (BMI) 40.0 and over, adult: Secondary | ICD-10-CM | POA: Diagnosis not present

## 2023-01-27 DIAGNOSIS — E876 Hypokalemia: Secondary | ICD-10-CM | POA: Diagnosis present

## 2023-01-27 DIAGNOSIS — R9431 Abnormal electrocardiogram [ECG] [EKG]: Secondary | ICD-10-CM | POA: Diagnosis not present

## 2023-01-27 DIAGNOSIS — Z7984 Long term (current) use of oral hypoglycemic drugs: Secondary | ICD-10-CM | POA: Diagnosis not present

## 2023-01-27 DIAGNOSIS — I5022 Chronic systolic (congestive) heart failure: Secondary | ICD-10-CM | POA: Diagnosis present

## 2023-01-27 DIAGNOSIS — E662 Morbid (severe) obesity with alveolar hypoventilation: Secondary | ICD-10-CM | POA: Diagnosis present

## 2023-01-27 DIAGNOSIS — Z86711 Personal history of pulmonary embolism: Secondary | ICD-10-CM

## 2023-01-27 DIAGNOSIS — J9611 Chronic respiratory failure with hypoxia: Secondary | ICD-10-CM | POA: Diagnosis present

## 2023-01-27 DIAGNOSIS — Z79899 Other long term (current) drug therapy: Secondary | ICD-10-CM | POA: Diagnosis not present

## 2023-01-27 DIAGNOSIS — Z9981 Dependence on supplemental oxygen: Secondary | ICD-10-CM | POA: Diagnosis not present

## 2023-01-27 DIAGNOSIS — G4733 Obstructive sleep apnea (adult) (pediatric): Secondary | ICD-10-CM

## 2023-01-27 DIAGNOSIS — Z7985 Long-term (current) use of injectable non-insulin antidiabetic drugs: Secondary | ICD-10-CM | POA: Diagnosis not present

## 2023-01-27 DIAGNOSIS — I272 Pulmonary hypertension, unspecified: Secondary | ICD-10-CM | POA: Diagnosis present

## 2023-01-27 DIAGNOSIS — I110 Hypertensive heart disease with heart failure: Secondary | ICD-10-CM | POA: Diagnosis present

## 2023-01-27 DIAGNOSIS — R400 Somnolence: Secondary | ICD-10-CM | POA: Diagnosis present

## 2023-01-27 DIAGNOSIS — Z1152 Encounter for screening for COVID-19: Secondary | ICD-10-CM | POA: Diagnosis not present

## 2023-01-27 LAB — CBC
HCT: 45.4 % (ref 36.0–46.0)
Hemoglobin: 14.5 g/dL (ref 12.0–15.0)
MCH: 23 pg — ABNORMAL LOW (ref 26.0–34.0)
MCHC: 31.9 g/dL (ref 30.0–36.0)
MCV: 72.1 fL — ABNORMAL LOW (ref 80.0–100.0)
Platelets: 408 10*3/uL — ABNORMAL HIGH (ref 150–400)
RBC: 6.3 MIL/uL — ABNORMAL HIGH (ref 3.87–5.11)
RDW: 23.6 % — ABNORMAL HIGH (ref 11.5–15.5)
WBC: 14.7 10*3/uL — ABNORMAL HIGH (ref 4.0–10.5)
nRBC: 0 % (ref 0.0–0.2)

## 2023-01-27 LAB — RESP PANEL BY RT-PCR (RSV, FLU A&B, COVID)  RVPGX2
Influenza A by PCR: NEGATIVE
Influenza B by PCR: NEGATIVE
Resp Syncytial Virus by PCR: NEGATIVE
SARS Coronavirus 2 by RT PCR: NEGATIVE

## 2023-01-27 LAB — ECHOCARDIOGRAM COMPLETE
Area-P 1/2: 4.8 cm2
Height: 62 in
S' Lateral: 3.1 cm
Weight: 5552.06 [oz_av]

## 2023-01-27 LAB — GLUCOSE, CAPILLARY
Glucose-Capillary: 137 mg/dL — ABNORMAL HIGH (ref 70–99)
Glucose-Capillary: 112 mg/dL — ABNORMAL HIGH (ref 70–99)
Glucose-Capillary: 144 mg/dL — ABNORMAL HIGH (ref 70–99)

## 2023-01-27 LAB — HEPARIN LEVEL (UNFRACTIONATED)
Heparin Unfractionated: <0.1 [IU]/mL — ABNORMAL LOW (ref 0.30–0.70)
Heparin Unfractionated: <0.1 [IU]/mL — ABNORMAL LOW (ref 0.30–0.70)

## 2023-01-27 LAB — PROTIME-INR
INR: 1 (ref 0.8–1.2)
Prothrombin Time: 13.1 s (ref 11.4–15.2)

## 2023-01-27 LAB — HEMOGLOBIN A1C
Hgb A1c MFr Bld: 6.6 % — ABNORMAL HIGH (ref 4.8–5.6)
Mean Plasma Glucose: 142.72 mg/dL

## 2023-01-27 MED ORDER — APIXABAN 5 MG PO TABS: 5.0000 mg | ORAL_TABLET | Freq: Two times a day (BID) | ORAL | Status: DC

## 2023-01-27 MED ORDER — EMPAGLIFLOZIN 10 MG PO TABS
10.0000 mg | ORAL_TABLET | Freq: Every day | ORAL | Status: DC
  Administered 2023-01-27 – 2023-01-30 (×4): 10 mg via ORAL
  Filled 2023-01-27 (×4): qty 1

## 2023-01-27 MED ORDER — APIXABAN 5 MG PO TABS: 10.0000 mg | ORAL_TABLET | Freq: Two times a day (BID) | ORAL | Status: DC

## 2023-01-27 MED ORDER — WARFARIN SODIUM 5 MG PO TABS
10.0000 mg | ORAL_TABLET | Freq: Once | ORAL | Status: AC
  Administered 2023-01-27: 10 mg via ORAL
  Filled 2023-01-27: qty 2

## 2023-01-27 MED ORDER — ALBUTEROL SULFATE (2.5 MG/3ML) 0.083% IN NEBU: 2.5000 mg | INHALATION_SOLUTION | Freq: Four times a day (QID) | RESPIRATORY_TRACT | Status: DC | PRN

## 2023-01-27 MED ORDER — ACETAMINOPHEN 325 MG PO TABS: 650.0000 mg | ORAL_TABLET | Freq: Four times a day (QID) | ORAL | Status: DC | PRN

## 2023-01-27 MED ORDER — HYDRALAZINE HCL 20 MG/ML IJ SOLN: 10.0000 mg | Freq: Four times a day (QID) | INTRAMUSCULAR | Status: DC | PRN

## 2023-01-27 MED ORDER — INSULIN ASPART 100 UNIT/ML IJ SOLN: 0.0000 [IU] | Freq: Three times a day (TID) | INTRAMUSCULAR | Status: DC

## 2023-01-27 MED ORDER — INSULIN ASPART 100 UNIT/ML IJ SOLN: 0.0000 [IU] | Freq: Every day | INTRAMUSCULAR | Status: DC

## 2023-01-27 MED ORDER — ENOXAPARIN SODIUM 150 MG/ML IJ SOSY
150.0000 mg | PREFILLED_SYRINGE | Freq: Two times a day (BID) | INTRAMUSCULAR | Status: DC
  Administered 2023-01-27 – 2023-01-29 (×4): 150 mg via SUBCUTANEOUS
  Filled 2023-01-27 (×6): qty 1

## 2023-01-27 MED ORDER — SPIRONOLACTONE 25 MG PO TABS
25.0000 mg | ORAL_TABLET | Freq: Every day | ORAL | Status: DC
  Administered 2023-01-27 – 2023-01-30 (×4): 25 mg via ORAL
  Filled 2023-01-27 (×4): qty 1

## 2023-01-27 MED ORDER — ACETAMINOPHEN 650 MG RE SUPP: 650.0000 mg | Freq: Four times a day (QID) | RECTAL | Status: DC | PRN

## 2023-01-27 MED ORDER — HEPARIN BOLUS VIA INFUSION
2700.0000 [IU] | Freq: Once | INTRAVENOUS | Status: DC
  Filled 2023-01-27: qty 2700

## 2023-01-27 MED ORDER — WARFARIN - PHARMACIST DOSING INPATIENT: Freq: Every day | Status: DC

## 2023-01-27 MED ORDER — OXYCODONE HCL 5 MG PO TABS: 5.0000 mg | ORAL_TABLET | ORAL | Status: DC | PRN

## 2023-01-27 MED ORDER — FUROSEMIDE 20 MG PO TABS
20.0000 mg | ORAL_TABLET | Freq: Every day | ORAL | Status: DC
  Administered 2023-01-27 – 2023-01-30 (×4): 20 mg via ORAL
  Filled 2023-01-27 (×4): qty 1

## 2023-01-27 NOTE — Progress Notes (Signed)
  Echocardiogram 2D Echocardiogram has been performed.  Delcie Roch 01/27/2023, 4:58 PM

## 2023-01-27 NOTE — Progress Notes (Signed)
Per nightshift nurse, MD aware of patient needing orders. Was informed that someone will be around to assess this morning.

## 2023-01-27 NOTE — Progress Notes (Signed)
Echo being performed at bedside at this time.

## 2023-01-27 NOTE — ED Notes (Addendum)
Report given to Luther Redo, RN

## 2023-01-27 NOTE — H&P (Addendum)
[facility] History and Physical  Christine Singleton ZOX:[id number] DOB: 1990/09/22 DOA: 01/26/2023 PCP: Grayce Sessions, NP  Presented from: Home Chief Complaint: Shortness of breath  History of Present Illness: Christine Singleton is a 32 y.o. female with PMH significant for morbid obesity, OSA, pulmonary hypertension, mild systolic CHF, chronic respiratory failure on 3 L nocturnal oxygen. Patient presented to the ED on 11/11 with complaint of shortness of breath and chest tightness.  He has the symptoms ongoing for few weeks.  She is on nocturnal oxygen but lately been using it during the day as well.  Dyspnea is more on exertion.  For the last few days, she has also been trying extra dose of Lasix and making a lot of urine despite which she continued to feel short of breath and hence presented to ED.  In the ED, patient was afebrile, initial heart rate 119, tachypneic to 28, blood pressure 112/54, initially maintaining oxygen saturation 3 L but later required BiPAP Labs with WC count 16.1, hemoglobin 13.8, potassium 3.4 BNP 26, troponin 11>9 EKG did not show ischemic changes Respiratory virus panel unremarkable CT angio chest suspected linear thrombus in the right main pulmonary artery extending from the pulmonary trunk bifurcation, and suspected subsegmental right upper and lower lobe embolic disease. Overall the suspected clot burden is small without findings of acute right heart strain.  Patient was started on heparin drip, given IV Lasix Hospitalist service consulted for inpatient management  I received this patient as a carryover admission from last night. Overnight, no fever, heart rate in 90s, on BiPAP, respiratory rate less than 20 mostly, blood pressure in 130s  At the time of my evaluation, patient was sitting up in her recliner.  Not on oxygen at rest. On heparin drip.  No family at bedside. Denies any recent travel, surgery.  Denies any past history of DVT/PE.  Not on  any blood thinners She tried Ozempic but stopped it after it started making her feel weird.  She is in process of getting Greggory Keen approved through her primary care provider. Patient denies being in any oral contraceptive pills.  In fact she has not had her menstrual period since the beginning of this year.  She says she was seen by GYN but has not followed up with them lately.  Urine pregnancy test negative in the ED  Review of Systems:  All systems were reviewed and were negative unless otherwise mentioned in the HPI   Past medical history: Past Medical History:  Diagnosis Date   Gestational diabetes    With first pregnancy    Past surgical history: Past Surgical History:  Procedure Laterality Date   CESAREAN SECTION     CESAREAN SECTION N/A 02/03/2016   Procedure: CESAREAN SECTION;  Surgeon: Lesly Dukes, MD;  Location: [facility] BIRTHING SU[facility];  Service: Obstetrics;  Laterality: N/A;    Social History:  reports that she has never smoked. She has never used smokeless tobacco. She reports that she does not drink alcohol and does not use drugs.  Allergies:  Allergies  Allergen Reactions   Strawberry (Diagnostic) Hives    Fresh Fruit   Strawberry (diagnostic)   Family history:  Family History  Problem Relation Age of Onset   Cancer Neg Hx    Diabetes Neg Hx    Hypertension Neg Hx      Physical Exam: Vitals:   01/27/23 0348 01/27/23 0354 01/27/23 0413 01/27/23 0727  BP:  123/77  130/75  Pulse:  99 (!) 102  97  Resp:  15 18 15   Temp: 98.4 F (36.9 C) 98.4 F (36.9 C)  98.5 F (36.9 C)  TempSrc:  Oral  Oral  SpO2:    97%  Weight:      Height:       Wt Readings from Last 3 Encounters:  01/26/23 (!) 157.4 kg  01/07/23 (!) 157.4 kg  09/17/22 (!) 156.7 kg   Body mass index is 63.47 kg/m.  General exam: Pleasant young African-American female, morbidly obese Skin: No rashes, lesions or ulcers. HEENT: Atraumatic, normocephalic, no obvious bleeding Lungs: Clear  to auscultation bilaterally CVS: Regular rate and rhythm, no murmur GI/Abd soft, distended from obesity, nontender, bowel sound present CNS: Alert, awake, oriented x 3 Psychiatry: Mood appropriate Extremities: No pedal edema.  Wrinkles seen on both her extremities probably suggestive of improving edema.  No calf tenderness.   ------------------------------------------------------------------------------------------------------ Assessment/Plan: Principal Problem:   Pulmonary embolism (HCC)  Acute pulmonary embolism Presented with progressively worsening shortness of breath for few weeks. CT angio chest suspected linear thrombus in the right main pulmonary artery extending from the pulmonary trunk bifurcation, and suspected subsegmental right upper and lower lobe embolic disease. Overall the suspected clot burden is small without findings of acute right heart strain. No recent travel, surgery, or personal history of malignancy. No prior history of DVT/PE.  Not on any anticoagulant prior for any other reason Patient has been started on heparin drip Obtain echocardiogram and ultrasound duplex of lower extremities. If clot burden is not significant, will switch to DOAC tonight or tomorrow morning.  Patient does not have any preference to Eliquis or Xarelto.  Chronic systolic CHF Pulm hypertension Patient was given IV Lasix in the ED.  Looks euvolemic at the time of my evaluation. PTA meds-Lasix, Aldactone, Jardiance Resume oral meds. Net IO Since Admission: No IO data has been entered for this period [01/27/23 1016] Continue to monitor for daily intake output, weight, blood pressure, BNP, renal function and electrolytes. Recent Labs  Lab 01/26/23 1757 01/26/23 1830 01/26/23 2127  BNP  --  26.5  --   BUN 11  --   --   CREATININE 0.96  --   --   NA 135  --  137  K 3.4*  --  4.0   Hypokalemia Improved with replacement  Dyspnea on exertion chronic hypoxic respiratory failure   OSA On 3 L nocturnal oxygen at home.  Lately using it during the day as well. Required BiPAP last night. Currently at rest without supplemental oxygen.  Check ambulatory oxygen requirement Continue to monitor  Type 2 diabetes mellitus A1c 7.1 on 08/18/2022.  Update A1c PTA meds-tried but did not tolerate Ozempic recently.  In process of getting Mounjaro approved.  Currently on Jardiance, metformin.  Continue Jardiance.  Keep metformin meds on hold Start SSI/Accu-Cheks Lab Results  Component Value Date   HGBA1C 7.1 (H) 08/18/2022   No results for input(s): "GLUCAP" in the last 168 hours.   Morbid Obesity  Body mass index is 63.47 kg/m. Patient has been advised to make an attempt to improve diet and exercise patterns to aid in weight loss.   Mobility: Encourage ambulation  Goals of care   Code Status: Full Code    DVT prophylaxis: Currently on heparin drip   Antimicrobials: None Fluid: None Consultants: None Family Communication: None at bedside  Dispo: The patient is from: Home              Anticipated d/c is to:  Home  Diet: Diet Order             Diet heart healthy/carb modified Fluid consistency: Thin  Diet effective now                    ------------------------------------------------------------------------------------- Severity of Illness: The appropriate patient status for this patient is INPATIENT. Inpatient status is judged to be reasonable and necessary in order to provide the required intensity of service to ensure the patient's safety. The patient's presenting symptoms, physical exam findings, and initial radiographic and laboratory data in the context of their chronic comorbidities is felt to place them at high risk for further clinical deterioration. Furthermore, it is not anticipated that the patient will be medically stable for discharge from the hospital within 2 midnights of admission.   * I certify that at the point of admission it is my  clinical judgment that the patient will require inpatient hospital care spanning beyond 2 midnights from the point of admission due to high intensity of service, high risk for further deterioration and high frequency of surveillance required.* -------------------------------------------------------------------------------------  Home Meds: Prior to Admission medications   Medication Sig Start Date End Date Taking? Authorizing Provider  Accu-Chek Softclix Lancets lancets Use to check blood sugar once daily. 11/04/22   Hoy Register, MD  acetaminophen (TYLENOL) 325 MG tablet Take 2 tablets (650 mg total) by mouth every 6 (six) hours as needed for mild pain or moderate pain (or Fever >/= 101). 08/19/22   Arrien, York Ram, MD  Blood Glucose Monitoring Suppl (ACCU-CHEK GUIDE) w/Device KIT Use to check blood sugar once daily. 11/04/22   Hoy Register, MD  Blood Pressure KIT Please check blood pressure every morning and keep a log. Patient not taking: Reported on 09/05/2022 08/19/22   Arrien, York Ram, MD  Dulaglutide (TRULICITY) 0.75 MG/0.5ML SOPN Inject 0.75 mg into the skin once a week. 12/14/22   [provider]  empagliflozin (JARDIANCE) 10 MG TABS tablet Take 1 tablet (10 mg total) by mouth daily. 09/05/22   Andrey Farmer, PA-C  furosemide (LASIX) 20 MG tablet Take 1 tablet (20 mg total) by mouth daily. 09/05/22   Andrey Farmer, PA-C  glucose blood (ACCU-CHEK GUIDE) test strip Use to check blood sugar once daily. 11/04/22   Hoy Register, MD  hydrOXYzine (ATARAX) 25 MG tablet Take 1 tablet (25 mg total) by mouth 3 (three) times daily as needed for anxiety. 09/05/22   Oneta Rack, NP  metFORMIN (GLUCOPHAGE) 500 MG tablet Take 1 tablet (500 mg total) by mouth daily with breakfast. 10/29/22   Grayce Sessions, NP  OXYGEN Inhale 2 application  into the lungs at bedtime.    [provider]  spironolactone (ALDACTONE) 25 MG tablet Take 1 tablet (25 mg total)  by mouth daily. 09/05/22 12/10/22  Andrey Farmer, PA-C  Vitamin D, Ergocalciferol, (DRISDOL) 1.25 MG (50000 UNIT) CAPS capsule Take 50,000 Units by mouth every 7 (seven) days.    [provider]    Labs on Admission:   CBC: Recent Labs  Lab 01/26/23 1757 01/26/23 2127  WBC 16.1*  --   HGB 13.8 16.0*  HCT 43.3 47.0*  MCV 71.8*  --   PLT 390  --     Basic Metabolic Panel: Recent Labs  Lab 01/26/23 1757 01/26/23 2127  NA 135 137  K 3.4* 4.0  CL 93*  --   CO2 33*  --   GLUCOSE 175*  --  BUN 11  --   CREATININE 0.96  --   CALCIUM 8.7*  --     Liver Function Tests: No results for input(s): "AST", "ALT", "ALKPHOS", "BILITOT", "PROT", "ALBUMIN" in the last 168 hours. No results for input(s): "LIPASE", "AMYLASE" in the last 168 hours. No results for input(s): "AMMONIA" in the last 168 hours.  Cardiac Enzymes: No results for input(s): "CKTOTAL", "CKMB", "CKMBINDEX", "TROPONINI" in the last 168 hours.  BNP (last 3 results) Recent Labs    08/14/22 1244 09/05/22 1216 01/26/23 1830  BNP 409.6* 34.5 26.5    ProBNP (last 3 results) No results for input(s): "PROBNP" in the last 8760 hours.  CBG: No results for input(s): "GLUCAP" in the last 168 hours.  Lipase     Component Value Date/Time   LIPASE 23 06/19/2015 1125     Urinalysis    Component Value Date/Time   COLORURINE YELLOW 11/15/2015 1434   APPEARANCEUR HAZY (A) 11/15/2015 1434   LABSPEC >=1.030 11/27/2015 1450   PHURINE 6.5 11/27/2015 1450   GLUCOSEU NEGATIVE 11/27/2015 1450   HGBUR TRACE (A) 11/27/2015 1450   BILIRUBINUR SMALL (A) 11/27/2015 1450   KETONESUR TRACE (A) 11/27/2015 1450   PROTEINUR 100 (A) 11/27/2015 1450   UROBILINOGEN 1.0 11/27/2015 1450   NITRITE NEGATIVE 11/27/2015 1450   LEUKOCYTESUR TRACE (A) 11/27/2015 1450     Drugs of Abuse     Component Value Date/Time   LABOPIA NONE DETECTED 11/15/2015 1434   COCAINSCRNUR NONE DETECTED 11/15/2015 1434   COCAINSCRNUR  NEG 07/31/2015 1448   LABBENZ NONE DETECTED 11/15/2015 1434   AMPHETMU NONE DETECTED 11/15/2015 1434   THCU POSITIVE (A) 11/15/2015 1434   LABBARB NONE DETECTED 11/15/2015 1434      Radiological Exams on Admission: CT Angio Chest PE W/Cm &/Or Wo Cm  Result Date: 01/26/2023 CLINICAL DATA:  32 year old with history of CHF, hypertension, presents for shortness of breath progressively worsening times 1.5 weeks. Pulmonary embolism suspected. EXAM: CT ANGIOGRAPHY CHEST WITH CONTRAST TECHNIQUE: Multidetector CT imaging of the chest was performed using the standard protocol during bolus administration of intravenous contrast. Multiplanar CT image reconstructions and MIPs were obtained to evaluate the vascular anatomy. RADIATION DOSE REDUCTION: This exam was performed according to the departmental dose-optimization program which includes automated exposure control, adjustment of the mA and/or kV according to patient size and/or use of iterative reconstruction technique. CONTRAST:  OMNIPAQUE IOHEXOL 350 MG/ML SOLN COMPARISON:  CTA chest 08/14/2022, portable chest today, portable chest 08/20/2022. FINDINGS: Cardiovascular: Stable mild-to-moderate cardiomegaly with left chamber predominance. Study is suboptimal, in part due to large body habitus causing beam hardening and in part due to breathing motion. There is a linear hypodensity however, within the right main pulmonary artery extending from the pulmonary trunk bifurcation on 302: 42-47 suspicious for a linear thrombus, and I suspect there is also subsegmental right lower lobe arterial embolic disease on 302:57 and on 03/02: 58-62. There is concern for subsegmental right upper lobe embolic disease on 601:84 and on 302:35 and on 302: 33 and 34. The findings could be being exaggerated due to motion artifact. Overall the suspected clot burden is small without findings of right heart strain and with again noted ectatic pulmonary trunk measuring 3.5 cm,  unchanged, consistent with arterial hypertension. There is no pericardial effusion. The pulmonary veins, aorta and great vessels are normal. Mediastinum/Nodes: No enlarged mediastinal, hilar, or axillary lymph nodes. Thyroid gland, trachea, and esophagus demonstrate no significant findings. Both main bronchi are patent. Lungs/Pleura: There is  diffuse mild bronchial thickening. There is mosaicism of the lungs consistent with air trapping and small airway disease but no focal pneumonia is evident. The lungs are otherwise clear. There is no pleural effusion, thickening or pneumothorax. Mild elevation right hemidiaphragm. Upper Abdomen: No acute abnormality. Musculoskeletal: No acute or significant osseous findings. Slight thoracic dextroscoliosis. The ribcage is intact. The visualized chest wall without mass or focal abnormality. Review of the MIP images confirms the above findings. IMPRESSION: 1. Suboptimal study due to body habitus and breathing motion. 2. Suspected linear thrombus in the right main pulmonary artery extending from the pulmonary trunk bifurcation, and suspected subsegmental right upper and lower lobe embolic disease. Overall the suspected clot burden is small without findings of acute right heart strain. 3. Stable cardiomegaly with left chamber predominance. 4. Stable ectatic pulmonary trunk measuring 3.5 cm consistent with arterial hypertension. 5. Diffuse mild bronchial thickening, with mosaicism of the lungs consistent with air trapping and small airway disease. No focal pneumonia is evident. 6. PRA is attempting to reach the ordering physician for stat notification at the time of signing. This report will be addended when contact has been made. Electronically Signed   By: Almira Bar M.D.   On: 01/26/2023 23:00   DG Chest Port 1 View  Result Date: 01/26/2023 CLINICAL DATA:  SHOB EXAM: PORTABLE CHEST 1 VIEW COMPARISON:  Chest x-ray 08/20/2022, CT angio chest 08/14/2022 FINDINGS: Stable  cardiomegaly. The heart and mediastinal contours are unchanged. Right lower lobe patchy airspace opacity. No pulmonary edema. No pleural effusion. No pneumothorax. No acute osseous abnormality. IMPRESSION: Right lower lobe patchy airspace opacity. Followup PA and lateral chest X-ray is recommended in 3-4 weeks following therapy to ensure resolution and exclude underlying malignancy. Electronically Signed   By: Tish Frederickson M.D.   On: 01/26/2023 21:58     Signed, Lorin Glass, MD [facility] 01/27/2023

## 2023-01-27 NOTE — Progress Notes (Signed)
Per pharmacist, "going to start warfarin  and do a Lovenox bridge (150 mg Q12h) until INR is therapeutic ". Was informed someone would be up to speak to her this afternoon

## 2023-01-27 NOTE — Progress Notes (Signed)
SATURATION QUALIFICATIONS: (This note is used to comply with regulatory documentation for home oxygen)  Patient Saturations on Room Air at Rest = 93%  Patient Saturations on Room Air while Ambulating = 90-93%  Patient reported that chest was starting to feel heavy. HR in the 110s (110-113). Dyspnea noted. Patient returned to room. SpO2% 93 at rest.

## 2023-01-27 NOTE — Plan of Care (Signed)
  Problem: Education: Goal: Knowledge of General Education information will improve Description: Including pain rating scale, medication(s)/side effects and non-pharmacologic comfort measures 01/27/2023 0626 by Luther Redo, RN Outcome: Progressing 01/27/2023 0626 by Luther Redo, RN Outcome: Progressing   Problem: Health Behavior/Discharge Planning: Goal: Ability to manage health-related needs will improve 01/27/2023 0626 by Luther Redo, RN Outcome: Progressing 01/27/2023 0626 by Luther Redo, RN Outcome: Progressing   Problem: Clinical Measurements: Goal: Ability to maintain clinical measurements within normal limits will improve 01/27/2023 0626 by Luther Redo, RN Outcome: Progressing 01/27/2023 0626 by Luther Redo, RN Outcome: Progressing Goal: Will remain free from infection 01/27/2023 0626 by Luther Redo, RN Outcome: Progressing 01/27/2023 0626 by Luther Redo, RN Outcome: Progressing Goal: Diagnostic test results will improve 01/27/2023 0626 by Luther Redo, RN Outcome: Progressing 01/27/2023 0626 by Luther Redo, RN Outcome: Progressing Goal: Respiratory complications will improve 01/27/2023 0626 by Luther Redo, RN Outcome: Progressing 01/27/2023 0626 by Luther Redo, RN Outcome: Progressing Goal: Cardiovascular complication will be avoided 01/27/2023 0626 by Luther Redo, RN Outcome: Progressing 01/27/2023 0626 by Luther Redo, RN Outcome: Progressing   Problem: Activity: Goal: Risk for activity intolerance will decrease 01/27/2023 0626 by Luther Redo, RN Outcome: Progressing 01/27/2023 0626 by Luther Redo, RN Outcome: Progressing   Problem: Nutrition: Goal: Adequate nutrition will be maintained 01/27/2023 0626 by Luther Redo, RN Outcome: Progressing 01/27/2023 0626 by Luther Redo, RN Outcome: Progressing   Problem: Coping: Goal: Level  of anxiety will decrease 01/27/2023 0626 by Luther Redo, RN Outcome: Progressing 01/27/2023 0626 by Luther Redo, RN Outcome: Progressing   Problem: Elimination: Goal: Will not experience complications related to bowel motility 01/27/2023 0626 by Luther Redo, RN Outcome: Progressing 01/27/2023 0626 by Luther Redo, RN Outcome: Progressing Goal: Will not experience complications related to urinary retention 01/27/2023 0626 by Luther Redo, RN Outcome: Progressing 01/27/2023 0626 by Luther Redo, RN Outcome: Progressing   Problem: Pain Management: Goal: General experience of comfort will improve 01/27/2023 0626 by Luther Redo, RN Outcome: Progressing 01/27/2023 0626 by Luther Redo, RN Outcome: Progressing   Problem: Safety: Goal: Ability to remain free from injury will improve 01/27/2023 0626 by Luther Redo, RN Outcome: Progressing 01/27/2023 0626 by Luther Redo, RN Outcome: Progressing   Problem: Skin Integrity: Goal: Risk for impaired skin integrity will decrease 01/27/2023 0626 by Luther Redo, RN Outcome: Progressing 01/27/2023 0626 by Luther Redo, RN Outcome: Progressing

## 2023-01-27 NOTE — ED Notes (Signed)
[facility] called for transport. 

## 2023-01-27 NOTE — Progress Notes (Signed)
Orders in. Patient guide and menu provided. Patient informed of diet order. Patient assisted to bathroom. Telemetry contacted- will plan to switch patient to telemetry box.

## 2023-01-27 NOTE — Progress Notes (Signed)
PHARMACY - ANTICOAGULATION CONSULT NOTE  Pharmacy Consult for IV heparin Indication: pulmonary embolus  Allergies  Allergen Reactions   Strawberry (Diagnostic) Hives    Fresh Fruit    Patient Measurements: Height: 5\' 2"  (157.5 cm) Weight: (!) 157.4 kg (347 lb 0.1 oz) IBW/kg (Calculated) : 50.1 Heparin Dosing Weight: 91.1 kg  Vital Signs: Temp: 98.5 F (36.9 C) (11/12 1138) Temp Source: Oral (11/12 1138) BP: 116/89 (11/12 1138) Pulse Rate: 99 (11/12 1138)  Labs: Recent Labs    01/26/23 1757 01/26/23 2006 01/26/23 2127 01/27/23 0734 01/27/23 1031  HGB 13.8  --  16.0*  --   --   HCT 43.3  --  47.0*  --   --   PLT 390  --   --   --   --   LABPROT 13.1  --   --   --   --   INR 1.0  --   --   --   --   HEPARINUNFRC  --   --   --  <0.10* <0.10*  CREATININE 0.96  --   --   --   --   TROPONINIHS 11 9  --   --   --     Estimated Creatinine Clearance: 126.9 mL/min (by C-G formula based on SCr of 0.96 mg/dL).   Medical History: Past Medical History:  Diagnosis Date   Gestational diabetes    With first pregnancy    Assessment: Christine Singleton is a 32 y.o. year old female admitted on 01/26/2023 with concern for PE, CTA chest with probable thrombus (imaging limited by body habitus).  No anticoagulation prior to admission.  Transitioning to warfarin with Lovenox bridge given questionable efficacy with DOAC in BMI >60.    Baseline INR 1.0.  Hgb 14.5, pltc 408.      Goal of Therapy:  INR 2-3 Anti-Xa level 0.6-1 units/ml 4hrs after LMWH dose given Monitor platelets by anticoagulation protocol: Yes   Plan:  START Lovenox 150 mg Q12h (~1 mg/kg q12h) as bridge until INR therapeutic  Warfarin 10 mg x1 Daily INR, CBC  Thank you for allowing pharmacy to participate in this patient's care.  Trixie Rude, PharmD Clinical Pharmacist 01/27/2023  12:49 PM

## 2023-01-27 NOTE — Progress Notes (Signed)
[facility] admits and consults paged. Receiving incoming call that provider has been assigned to patient.

## 2023-01-27 NOTE — Plan of Care (Signed)

## 2023-01-27 NOTE — Plan of Care (Signed)

## 2023-01-27 NOTE — Progress Notes (Signed)
Heparin unfractionated level resulted- pharmacy informed. No signs of bleeding or bruising noted.

## 2023-01-27 NOTE — Progress Notes (Signed)
[facility] was page to inform them that the the pt has arrive. That individual told me the admitting physician is Dr. Julian Reil, but Dr. Julian Reil would probably not see pt until after 7a.m. If I need something urgent I can page Dr. Julian Reil.

## 2023-01-28 ENCOUNTER — Other Ambulatory Visit (HOSPITAL_COMMUNITY): Payer: Self-pay

## 2023-01-28 DIAGNOSIS — I2699 Other pulmonary embolism without acute cor pulmonale: Secondary | ICD-10-CM | POA: Diagnosis not present

## 2023-01-28 LAB — BASIC METABOLIC PANEL
Anion gap: 8 (ref 5–15)
BUN: 9 mg/dL (ref 6–20)
CO2: 31 mmol/L (ref 22–32)
Calcium: 9.1 mg/dL (ref 8.9–10.3)
Chloride: 96 mmol/L — ABNORMAL LOW (ref 98–111)
Creatinine, Ser: 0.64 mg/dL (ref 0.44–1.00)
GFR, Estimated: >60 mL/min (ref >60)
Glucose, Bld: 111 mg/dL — ABNORMAL HIGH (ref 70–99)
Potassium: 3.9 mmol/L (ref 3.5–5.1)
Sodium: 135 mmol/L (ref 135–145)

## 2023-01-28 LAB — CBC
HCT: 43 % (ref 36.0–46.0)
Hemoglobin: 13.7 g/dL (ref 12.0–15.0)
MCH: 22.8 pg — ABNORMAL LOW (ref 26.0–34.0)
MCHC: 31.9 g/dL (ref 30.0–36.0)
MCV: 71.7 fL — ABNORMAL LOW (ref 80.0–100.0)
Platelets: 345 10*3/uL (ref 150–400)
RBC: 6 MIL/uL — ABNORMAL HIGH (ref 3.87–5.11)
RDW: 23.1 % — ABNORMAL HIGH (ref 11.5–15.5)
WBC: 13.9 10*3/uL — ABNORMAL HIGH (ref 4.0–10.5)
nRBC: 0 % (ref 0.0–0.2)

## 2023-01-28 LAB — GLUCOSE, CAPILLARY
Glucose-Capillary: 120 mg/dL — ABNORMAL HIGH (ref 70–99)
Glucose-Capillary: 141 mg/dL — ABNORMAL HIGH (ref 70–99)
Glucose-Capillary: 108 mg/dL — ABNORMAL HIGH (ref 70–99)
Glucose-Capillary: 130 mg/dL — ABNORMAL HIGH (ref 70–99)

## 2023-01-28 LAB — PROTIME-INR
INR: 1.1 (ref 0.8–1.2)
Prothrombin Time: 13.9 s (ref 11.4–15.2)

## 2023-01-28 MED ORDER — WARFARIN SODIUM 5 MG PO TABS
10.0000 mg | ORAL_TABLET | Freq: Once | ORAL | Status: AC
  Administered 2023-01-28: 10 mg via ORAL
  Filled 2023-01-28: qty 2

## 2023-01-28 NOTE — TOC Initial Note (Signed)
Transition of Care [facility]) - Initial/Assessment Note    Patient Details  Name: Christine Singleton MRN: [id number] Date of Birth: 09-09-1990  Transition of Care [facility]) CM/SW Contact:    Leone Haven, RN Phone Number: 01/28/2023, 2:20 PM  Clinical Narrative:                 From home with mother and grandmother, has PCP ( [facility]) and insurance on file, states has no HH services in place at this time or DME at home.  States family member will transport them home at [employer] and family is support system, states gets [facility].  Pta self ambulatory. NCM assisted patient with getting transportation to doctors appt, by calling [employer] [phone], she has scheduled pickups for two of her doctors appts.    Expected Discharge Plan: Home/Self Care Barriers to Discharge: Continued Medical Work up   Patient Goals and CMS Choice Patient states their goals for this hospitalization and ongoing recovery are:: return home   Choice offered to / list presented to : NA      Expected Discharge Plan and Services In-house Referral: NA Discharge Planning Services: CM Consult Post Acute Care Choice: NA Living arrangements for the past 2 months: Single Family Home                 DME Arranged: N/A DME Agency: NA       HH Arranged: NA          Prior Living Arrangements/Services Living arrangements for the past 2 months: Single Family Home Lives with:: Parents Patient language and need for interpreter reviewed:: Yes Do you feel safe going back to the place where you live?: Yes      Need for Family Participation in Patient Care: Yes (Comment) Care giver support system in place?: Yes (comment) Current home services: DME (home oxygen 3 liters at night) Criminal Activity/Legal Involvement Pertinent to Current Situation/Hospitalization: No - Comment as needed  Activities of Daily Living   ADL Screening (condition at time of admission) Independently performs  ADLs?: Yes (appropriate for developmental age) Is the patient deaf or have difficulty hearing?: No Does the patient have difficulty seeing, even when wearing glasses/contacts?: No Does the patient have difficulty concentrating, remembering, or making decisions?: No  Permission Sought/Granted Permission sought to share information with : Case Manager Permission granted to share information with : Yes, Verbal Permission Granted              Emotional Assessment Appearance:: Appears stated age Attitude/Demeanor/Rapport: Engaged Affect (typically observed): Appropriate Orientation: : Oriented to Self, Oriented to Place, Oriented to  Time, Oriented to Situation Alcohol / Substance Use: Not Applicable Psych Involvement: No (comment)  Admission diagnosis:  Shortness of breath [R06.02] Pulmonary embolism (HCC) [I26.99] Patient Active Problem List   Diagnosis Date Noted   Pulmonary embolism (HCC) 01/26/2023   OSA (obstructive sleep apnea) 01/07/2023   Chronic systolic heart failure (HCC) 09/05/2022   Essential hypertension 08/19/2022   Hyponatremia 08/18/2022   Class 3 obesity 08/16/2022   Acute on chronic diastolic CHF (congestive heart failure) (HCC) 08/14/2022   Acute respiratory failure with hypoxia (HCC) 08/14/2022   Non-insulin dependent type 2 diabetes mellitus (HCC) 08/14/2022   Leukocytosis 08/14/2022   Iron deficiency anemia 08/14/2022   History of cesarean delivery 04/23/2016   Delivery by elective cesarean section 02/03/2016   PCP:  Grayce Sessions, NP Pharmacy:   [facility] [address]  [address], [address] [place] [address] Phone: [phone] Fax: [phone]  [facility] [address] [place] [address] Phone: [phone] Fax: [phone]     Social Determinants of Health (SDOH) Social History: SDOH Screenings   Food Insecurity: No Food Insecurity (01/27/2023)   Housing: Patient Declined (01/27/2023)  Transportation Needs: Unmet Transportation Needs (01/27/2023)  Utilities: Not At Risk (01/27/2023)  Alcohol Screen: Low Risk  (08/18/2022)  Depression (PHQ2-9): High Risk (09/05/2022)  Financial Resource Strain: High Risk (09/08/2022)  Tobacco Use: Low Risk  (01/26/2023)  Recent Concern: Tobacco Use - Medium Risk (12/04/2022)   Received from [facility]   SDOH Interventions:     Readmission Risk Interventions     No data to display

## 2023-01-28 NOTE — Progress Notes (Addendum)
PHARMACY - ANTICOAGULATION CONSULT NOTE  Pharmacy Consult for warfarin + lovenox bridge Indication: pulmonary embolus  Allergies  Allergen Reactions   Strawberry (Diagnostic) Hives    Fresh Fruit    Patient Measurements: Height: 5\' 2"  (157.5 cm) Weight: (!) 156.6 kg (345 lb 3.9 oz) IBW/kg (Calculated) : 50.1 Heparin Dosing Weight: 91.1 kg  Vital Signs: Temp: 97.6 F (36.4 C) (11/13 0403) Temp Source: Axillary (11/13 0403) BP: 121/80 (11/13 0720) Pulse Rate: 87 (11/13 0720)  Labs: Recent Labs    01/26/23 1757 01/26/23 2006 01/26/23 2127 01/27/23 0734 01/27/23 1031 01/27/23 1227 01/28/23 0342  HGB 13.8  --  16.0*  --   --  14.5 13.7  HCT 43.3  --  47.0*  --   --  45.4 43.0  PLT 390  --   --   --   --  408* 345  LABPROT 13.1  --   --   --  13.1  --  13.9  INR 1.0  --   --   --  1.0  --  1.1  HEPARINUNFRC  --   --   --  <0.10* <0.10*  --   --   CREATININE 0.96  --   --   --   --   --  0.64  TROPONINIHS 11 9  --   --   --   --   --     Estimated Creatinine Clearance: 151.8 mL/min (by C-G formula based on SCr of 0.64 mg/dL).   Medical History: Past Medical History:  Diagnosis Date   Gestational diabetes    With first pregnancy    Assessment: Christine Singleton is a 32 y.o. year old female admitted on 01/26/2023 with concern for PE, CTA chest with probable thrombus (imaging limited by body habitus).  No anticoagulation prior to admission.  Transitioning to warfarin with Lovenox bridge given questionable efficacy with DOAC in BMI >60, weight >150 kg.    INR 1.1, subtherapeutic.  Hgb 13.7, pltc 345.   Of note, patient endorses eating 1-2 servings of vitamin K foods at home and plans to continue this at discharge.   Goal of Therapy:  INR 2-3 Anti-Xa level 0.6-1 units/ml 4hrs after LMWH dose given Monitor platelets by anticoagulation protocol: Yes   Plan:  Continue Lovenox 150 mg Q12h (~1 mg/kg q12h) as bridge until INR therapeutic  Warfarin 10 mg x1 Daily INR,  CBC Obtain anti-Xa level 11/14 AM @ steady state Educated 11/12, provided additional materials on VitK foods 11/13 per patient request  Thank you for allowing pharmacy to participate in this patient's care.  Trixie Rude, PharmD Clinical Pharmacist 01/28/2023  11:16 AM

## 2023-01-28 NOTE — TOC Benefit Eligibility Note (Signed)
Patient Product/process development scientist completed.    The patient is insured through [employer].     Ran test claim for enoxaparin (Lovenox) 100 mg/ml and the current 30 day co-pay is $4.00.   This test claim was processed through [facility]- copay amounts may vary at other pharmacies due to pharmacy/plan contracts, or as the patient moves through the different stages of their insurance plan.     Roland Earl, CPHT Pharmacy Technician III Certified Patient Advocate [facility] Patient Advocate Team Direct Number: [phone]  Fax: [phone]

## 2023-01-28 NOTE — Progress Notes (Signed)
PROGRESS NOTE  Engineering geologist  DOB: April 26, 1990  PCP: Grayce Sessions, NP ZOX:[id number]  DOA: 01/26/2023  LOS: 1 day  Hospital Day: 3  Brief narrative:  Christine Singleton is a 32 y.o. female with PMH significant for morbid obesity, OSA, pulmonary hypertension, mild systolic CHF, chronic respiratory failure on 3 L nocturnal oxygen. Patient presented to the ED on 11/11 with complaint of shortness of breath and chest tightness.  He has the symptoms ongoing for few weeks.  She is on nocturnal oxygen but lately been using it during the day as well.  Dyspnea is more on exertion.  For the preceding few days, she has also been trying extra dose of Lasix and making a lot of urine despite which she continued to feel short of breath and hence presented to ED.  In the ED, patient was afebrile, initial heart rate 119, tachypneic to 28, blood pressure 112/54, initially maintaining oxygen saturation 3 L but later required BiPAP Labs with WC count 16.1, hemoglobin 13.8, potassium 3.4 BNP 26, troponin 11>9 EKG did not show ischemic changes Respiratory virus panel unremarkable CT angio chest suspected linear thrombus in the right main pulmonary artery extending from the pulmonary trunk bifurcation, and suspected subsegmental right upper and lower lobe embolic disease. Overall the suspected clot burden is small without findings of acute right heart strain.  Patient was started on heparin drip, given IV Lasix Admitted to [facility]  Of note, patient denies any recent travel, surgery.  Denies any past history of DVT/PE.  Not on any blood thinners She tried Ozempic but stopped it after it started making her feel weird.  She is in process of getting Greggory Keen approved through her primary care provider. Patient denies being in any oral contraceptive pills.  In fact she has not had her menstrual period since the beginning of this year.  She says she was seen by GYN but has not followed up with them lately.  Urine  pregnancy test negative in the ED  Subjective: Patient was seen and examined this morning. Morbidly obese young African-American female.  Lying in bed. Not in distress. Switched from heparin drip to Coumadin and Lovenox bridge yesterday.  Assessment/Plan: Principal Problem:   Pulmonary embolism (HCC)  Acute pulmonary embolism Presented with progressively worsening shortness of breath for few weeks. CT angio chest suspected linear thrombus in the right main pulmonary artery extending from the pulmonary trunk bifurcation, and suspected subsegmental right upper and lower lobe embolic disease. Overall the suspected clot burden is small without findings of acute right heart strain. No recent travel, surgery, or personal history of malignancy. No prior history of DVT/PE.  Not on any anticoagulant prior for any other reason Patient was started on heparin drip. Post admission, she was switched to Coumadin and Lovenox bridge. Echocardiogram without any RV strain. Ultrasound duplex of lower extremities negative for DVT. Recent Labs  Lab 01/26/23 1757 01/27/23 1031 01/28/23 0342  INR 1.0 1.0 1.1   Chronic systolic CHF Pulm hypertension Patient was given IV Lasix in the ED.  Looks euvolemic  PTA meds-Lasix, Aldactone, Jardiance.  Continue all Echocardiogram with EF 45 to 50%, LV global hypokinesis, unchanged echo from May 2024. Net IO Since Admission: 306 mL [01/28/23 1543] Continue to monitor for daily intake output, weight, blood pressure, BNP, renal function and electrolytes. Recent Labs  Lab 01/26/23 1757 01/26/23 1830 01/26/23 2127 01/28/23 0342  BNP  --  26.5  --   --   BUN 11  --   --  9  CREATININE 0.96  --   --  0.64  NA 135  --  137 135  K 3.4*  --  4.0 3.9   Hypokalemia Improved with replacement  Dyspnea on exertion chronic hypoxic respiratory failure  OSA On 3 L nocturnal oxygen at home.  Lately using it during the day as well. Currently not on supplemental  oxygen at rest.  Continue nocturnal oxygen. Continue to monitor ambulatory oxygen requirement  Type 2 diabetes mellitus A1c 6.6 on 01/27/2023 PTA meds-tried but did not tolerate Ozempic recently.  In process of getting Mounjaro approved.  Currently on Jardiance, metformin.  Continue Jardiance.  Keep metformin meds on hold Currently on SSI/Accu-Cheks Recent Labs  Lab 01/27/23 1115 01/27/23 1626 01/27/23 2109 01/28/23 0609 01/28/23 1127  GLUCAP 137* 112* 144* 120* 141*   Morbid Obesity  Body mass index is 63.15 kg/m. Patient has been advised to make an attempt to improve diet and exercise patterns to aid in weight loss.  Mobility: Encourage ambulation  Goals of care   Code Status: Full Code    DVT prophylaxis:  warfarin (COUMADIN) tablet 10 mg   Antimicrobials: None Fluid: None Consultants: None Family Communication: None at bedside  Status: Inpatient Level of care:  Progressive   Patient is from: Home Needs to continue in-hospital care: INR is subtherapeutic Anticipated d/c to: Home after INR is in therapeutic range    Diet:  Diet Order             Diet heart healthy/carb modified Fluid consistency: Thin  Diet effective now                   Scheduled Meds:  empagliflozin  10 mg Oral Daily   enoxaparin (LOVENOX) injection  150 mg Subcutaneous Q12H   furosemide  20 mg Oral Daily   insulin aspart  0-5 Units Subcutaneous QHS   insulin aspart  0-9 Units Subcutaneous TID WC   ipratropium-albuterol  3 mL Nebulization Once   spironolactone  25 mg Oral Daily   warfarin  10 mg Oral ONCE-1600   Warfarin - Pharmacist Dosing Inpatient   Does not apply q1600    PRN meds: acetaminophen **OR** acetaminophen, albuterol, hydrALAZINE, oxyCODONE   Infusions:    Antimicrobials: Anti-infectives (From admission, onward)    None       Objective: Vitals:   01/28/23 0720 01/28/23 1147  BP: 121/80 (!) 135/104  Pulse: 87 91  Resp: 16 20  Temp:    SpO2: 100%  91%    Intake/Output Summary (Last 24 hours) at 01/28/2023 1545 Last data filed at 01/28/2023 1330 Gross per 24 hour  Intake 1069 ml  Output 800 ml  Net 269 ml   Filed Weights   01/26/23 2303 01/28/23 0403  Weight: (!) 157.4 kg (!) 156.6 kg   Weight change: -0.8 kg Body mass index is 63.15 kg/m.   Physical Exam: General exam: Pleasant young African-American female, morbidly obese Skin: No rashes, lesions or ulcers. HEENT: Atraumatic, normocephalic, no obvious bleeding Lungs: Clear to auscultation bilaterally CVS: Regular rate and rhythm, no murmur GI/Abd soft, distended from obesity, nontender, bowel sound present CNS: Alert, awake, oriented x 3 Psychiatry: Mood appropriate Extremities: No pedal edema.  Wrinkles seen on both her extremities probably suggestive of improving edema.  No calf tenderness.  Data Review: I have personally reviewed the laboratory data and studies available.  F/u labs ordered [employer] (From admission, onward)     Start  Ordered   01/29/23 0500  Low molecular wgt heparin (fractionated) (hepr anti-XA)  Once-Timed,   R        01/28/23 1421   01/28/23 0500  CBC  Daily,   R      01/27/23 1149   01/28/23 0500  Protime-INR  Daily,   R      01/27/23 1249            Total time spent in review of labs and imaging, patient evaluation, formulation of plan, documentation and communication with family: 45 minutes  Signed, Lorin Glass, MD [facility] 01/28/2023

## 2023-01-29 DIAGNOSIS — I2699 Other pulmonary embolism without acute cor pulmonale: Secondary | ICD-10-CM | POA: Diagnosis not present

## 2023-01-29 LAB — CBC
HCT: 43.2 % (ref 36.0–46.0)
Hemoglobin: 13.8 g/dL (ref 12.0–15.0)
MCH: 23 pg — ABNORMAL LOW (ref 26.0–34.0)
MCHC: 31.9 g/dL (ref 30.0–36.0)
MCV: 72 fL — ABNORMAL LOW (ref 80.0–100.0)
Platelets: 364 10*3/uL (ref 150–400)
RBC: 6 MIL/uL — ABNORMAL HIGH (ref 3.87–5.11)
RDW: 23.2 % — ABNORMAL HIGH (ref 11.5–15.5)
WBC: 12.5 10*3/uL — ABNORMAL HIGH (ref 4.0–10.5)
nRBC: 0 % (ref 0.0–0.2)

## 2023-01-29 LAB — HEPARIN ANTI-XA: Heparin LMW: 1.16 [IU]/mL

## 2023-01-29 LAB — GLUCOSE, CAPILLARY
Glucose-Capillary: 112 mg/dL — ABNORMAL HIGH (ref 70–99)
Glucose-Capillary: 103 mg/dL — ABNORMAL HIGH (ref 70–99)
Glucose-Capillary: 95 mg/dL (ref 70–99)
Glucose-Capillary: 128 mg/dL — ABNORMAL HIGH (ref 70–99)

## 2023-01-29 LAB — PROTIME-INR
INR: 1.1 (ref 0.8–1.2)
Prothrombin Time: 14.4 s (ref 11.4–15.2)

## 2023-01-29 MED ORDER — ONDANSETRON HCL 4 MG/2ML IJ SOLN
4.0000 mg | Freq: Four times a day (QID) | INTRAMUSCULAR | Status: DC | PRN
  Filled 2023-01-29: qty 2

## 2023-01-29 MED ORDER — ENOXAPARIN SODIUM 120 MG/0.8ML IJ SOSY
120.0000 mg | PREFILLED_SYRINGE | Freq: Two times a day (BID) | INTRAMUSCULAR | Status: DC
  Administered 2023-01-29 (×2): 120 mg via SUBCUTANEOUS
  Filled 2023-01-29 (×3): qty 0.8

## 2023-01-29 MED ORDER — WARFARIN SODIUM 5 MG PO TABS
10.0000 mg | ORAL_TABLET | Freq: Once | ORAL | Status: AC
  Administered 2023-01-29: 10 mg via ORAL
  Filled 2023-01-29: qty 2

## 2023-01-29 NOTE — Progress Notes (Signed)
PROGRESS NOTE  Engineering geologist  DOB: Aug 07, 1990  PCP: Grayce Sessions, NP JXB:[id number]  DOA: 01/26/2023  LOS: 2 days  Hospital Day: 4  Brief narrative:  Christine Singleton is a 32 y.o. female with PMH significant for morbid obesity, OSA, pulmonary hypertension, mild systolic CHF, chronic respiratory failure on 3 L nocturnal oxygen. Patient presented to the ED on 11/11 with complaint of shortness of breath and chest tightness.  He has the symptoms ongoing for few weeks.  She is on nocturnal oxygen but lately been using it during the day as well.  Dyspnea is more on exertion.  For the preceding few days, she has also been trying extra dose of Lasix and making a lot of urine despite which she continued to feel short of breath and hence presented to ED.  In the ED, patient was afebrile, initial heart rate 119, tachypneic to 28, blood pressure 112/54, initially maintaining oxygen saturation 3 L but later required BiPAP Labs with WC count 16.1, hemoglobin 13.8, potassium 3.4 BNP 26, troponin 11>9 EKG did not show ischemic changes Respiratory virus panel unremarkable CT angio chest suspected linear thrombus in the right main pulmonary artery extending from the pulmonary trunk bifurcation, and suspected subsegmental right upper and lower lobe embolic disease. Overall the suspected clot burden is small without findings of acute right heart strain.  Patient was started on heparin drip, given IV Lasix Admitted to [facility]  Of note, patient denies any recent travel, surgery.  Denies any past history of DVT/PE.  Not on any blood thinners She tried Ozempic but stopped it after it started making her feel weird.  She is in process of getting Greggory Keen approved through her primary care provider. Patient denies being in any oral contraceptive pills.  In fact she has not had her menstrual period since the beginning of this year.  She says she was seen by GYN but has not followed up with them lately.  Urine  pregnancy test negative in the ED  Subjective: Patient was seen and examined this morning. Lying on bed.  Not in distress.  Tearful when mentioned about inability to discharge because of INR not buzzing yet.  She understands the need to stay in the hospital  Assessment/Plan: Acute pulmonary embolism Presented with progressively worsening shortness of breath for few weeks. CT angio chest suspected linear thrombus in the right main pulmonary artery extending from the pulmonary trunk bifurcation, and suspected subsegmental right upper and lower lobe embolic disease. Overall the suspected clot burden is small without findings of acute right heart strain. No recent travel, surgery, or personal history of malignancy. No prior history of DVT/PE.  Not on any anticoagulant prior for any other reason Patient was started on heparin drip and subsequently switched to Coumadin with Lovenox bridge. Echocardiogram without any RV strain. Ultrasound duplex of lower extremities negative for DVT. Currently we are looking forward for a plan to discharge home on Coumadin and Lovenox bridge.  However unable to do that today because INR has not shown any effect at all from escalating dose of Coumadin.  Probably related to her weight.  Discussed with pharmacy. Recent Labs  Lab 01/26/23 1757 01/27/23 1031 01/28/23 0342 01/29/23 0550  INR 1.0 1.0 1.1 1.1   Chronic systolic CHF Pulm hypertension Patient was given IV Lasix in the ED.  Looks euvolemic  PTA meds-Lasix, Aldactone, Jardiance.  Continue all Echocardiogram with EF 45 to 50%, LV global hypokinesis, unchanged echo from May 2024. Net IO Since Admission: -376  mL [01/29/23 1125] Continue to monitor for daily intake output, weight, blood pressure, BNP, renal function and electrolytes. Recent Labs  Lab 01/26/23 1757 01/26/23 1830 01/26/23 2127 01/28/23 0342  BNP  --  26.5  --   --   BUN 11  --   --  9  CREATININE 0.96  --   --  0.64  NA 135  --  137  135  K 3.4*  --  4.0 3.9   Hypokalemia Improved with replacement  Dyspnea on exertion chronic hypoxic respiratory failure  OSA On 3 L nocturnal oxygen at home.  Lately using it during the day as well. Currently not on supplemental oxygen at rest.  Continue nocturnal oxygen. Continue to monitor ambulatory oxygen requirement  Type 2 diabetes mellitus A1c 6.6 on 01/27/2023 PTA meds-tried but did not tolerate Ozempic recently.  In process of getting Mounjaro approved.  Currently on Jardiance, metformin.  Continue Jardiance.  Keep metformin meds on hold Currently on SSI/Accu-Cheks Recent Labs  Lab 01/28/23 1127 01/28/23 1618 01/28/23 2141 01/29/23 0559 01/29/23 1116  GLUCAP 141* 108* 130* 112* 103*   Morbid Obesity  Body mass index is 62.19 kg/m. Patient has been advised to make an attempt to improve diet and exercise patterns to aid in weight loss.  Mobility: Encourage ambulation  Goals of care   Code Status: Full Code    DVT prophylaxis:  warfarin (COUMADIN) tablet 10 mg   Antimicrobials: None Fluid: None Consultants: None Family Communication: None at bedside  Status: Inpatient Level of care:  Progressive   Patient is from: Home Needs to continue in-hospital care: INR is subtherapeutic Anticipated d/c to: Home after INR is in therapeutic range    Diet:  Diet Order             Diet heart healthy/carb modified Fluid consistency: Thin  Diet effective now                   Scheduled Meds:  empagliflozin  10 mg Oral Daily   enoxaparin (LOVENOX) injection  120 mg Subcutaneous Q12H   furosemide  20 mg Oral Daily   insulin aspart  0-5 Units Subcutaneous QHS   insulin aspart  0-9 Units Subcutaneous TID WC   ipratropium-albuterol  3 mL Nebulization Once   spironolactone  25 mg Oral Daily   warfarin  10 mg Oral ONCE-1600   Warfarin - Pharmacist Dosing Inpatient   Does not apply q1600    PRN meds: acetaminophen **OR** acetaminophen, albuterol,  hydrALAZINE, oxyCODONE   Infusions:    Antimicrobials: Anti-infectives (From admission, onward)    None       Objective: Vitals:   01/29/23 1000 01/29/23 1117  BP:  102/81  Pulse: 88 91  Resp:  19  Temp:  98.3 F (36.8 C)  SpO2: 98% 95%    Intake/Output Summary (Last 24 hours) at 01/29/2023 1125 Last data filed at 01/29/2023 0904 Gross per 24 hour  Intake 473 ml  Output 800 ml  Net -327 ml   Filed Weights   01/28/23 0403 01/29/23 0432 01/29/23 0601  Weight: (!) 156.6 kg (!) 156.6 kg (!) 154.2 kg   Weight change: 0 kg Body mass index is 62.19 kg/m.   Physical Exam: General exam: Pleasant young African-American female, morbidly obese Skin: No rashes, lesions or ulcers. HEENT: Atraumatic, normocephalic, no obvious bleeding Lungs: Clear to auscultation bilaterally CVS: Regular rate and rhythm, no murmur GI/Abd soft, distended from obesity, nontender, bowel sound present CNS: Alert,  awake, oriented x 3 Psychiatry: Mood appropriate Extremities: No pedal edema.  Wrinkles seen on both her extremities probably suggestive of improving edema.  No calf tenderness.  Data Review: I have personally reviewed the laboratory data and studies available.  F/u labs ordered Unresulted Labs (From admission, onward)     Start     Ordered   01/28/23 0500  CBC  Daily,   R      01/27/23 1149   01/28/23 0500  Protime-INR  Daily,   R      01/27/23 1249            Total time spent in review of labs and imaging, patient evaluation, formulation of plan, documentation and communication with family: 25 minutes  Signed, Lorin Glass, MD [facility] 01/29/2023

## 2023-01-29 NOTE — Plan of Care (Signed)

## 2023-01-29 NOTE — Progress Notes (Signed)
PHARMACY - ANTICOAGULATION CONSULT NOTE  Pharmacy Consult for warfarin + lovenox bridge Indication: pulmonary embolus  Allergies  Allergen Reactions   Strawberry (Diagnostic) Hives    Fresh Fruit    Patient Measurements: Height: 5\' 2"  (157.5 cm) Weight: (!) 154.2 kg (340 lb) IBW/kg (Calculated) : 50.1 Heparin Dosing Weight: 91.1 kg  Vital Signs: Temp: 97.4 F (36.3 C) (11/14 0722) Temp Source: Oral (11/14 0722) BP: 110/69 (11/14 0722) Pulse Rate: 89 (11/14 0722)  Labs: Recent Labs    01/26/23 1757 01/26/23 2006 01/26/23 2127 01/27/23 0734 01/27/23 1031 01/27/23 1227 01/28/23 0342 01/29/23 0550  HGB 13.8  --    < >  --   --  14.5 13.7 13.8  HCT 43.3  --    < >  --   --  45.4 43.0 43.2  PLT 390  --   --   --   --  408* 345 364  LABPROT 13.1  --   --   --  13.1  --  13.9 14.4  INR 1.0  --   --   --  1.0  --  1.1 1.1  HEPARINUNFRC  --   --   --  <0.10* <0.10*  --   --   --   HEPRLOWMOCWT  --   --   --   --   --   --   --  1.16  CREATININE 0.96  --   --   --   --   --  0.64  --   TROPONINIHS 11 9  --   --   --   --   --   --    < > = values in this interval not displayed.    Estimated Creatinine Clearance: 150.2 mL/min (by C-G formula based on SCr of 0.64 mg/dL).   Medical History: Past Medical History:  Diagnosis Date   Gestational diabetes    With first pregnancy    Assessment: Christine Singleton is a 32 y.o. year old female admitted on 01/26/2023 with concern for PE, CTA chest with probable thrombus (imaging limited by body habitus).  No anticoagulation prior to admission.  Transitioning to warfarin with Lovenox bridge given questionable efficacy with DOAC in BMI >60, weight >150 kg.    Of note, patient endorses eating 1-2 servings of vitamin K foods at home and plans to continue this at discharge.  11/14: Lovenox level slightly supratherapeutic at 1.16 with lovenox dosed 150 mg [place] Q12H. Will reduce dose by 20%. INR unchanged at 1.1 following a couple of 10 mg  doses. CBC stable. No signs of bleeding noted.   Goal of Therapy:  INR 2-3 Anti-Xa level 0.6-1 units/ml 4hrs after LMWH dose given Monitor platelets by anticoagulation protocol: Yes   Plan:  Decrease Lovenox to 120 mg Q12h as bridge until INR therapeutic  Warfarin 10 mg x1 Daily INR, CBC Educated 11/12, provided additional materials on VitK foods 11/13 per patient request  Thank you for allowing pharmacy to participate in this patient's care.  Jani Gravel, PharmD Clinical Pharmacist  01/29/2023 7:50 AM

## 2023-01-29 NOTE — Progress Notes (Signed)
   01/29/23 1940  BiPAP/CPAP/SIPAP  BiPAP/CPAP/SIPAP Pt Type Adult  BiPAP/CPAP/SIPAP V60  Reason BIPAP/CPAP not in use Non-compliant  BiPAP/CPAP /SiPAP Vitals  Temp 98.2 F (36.8 C)  Pulse Rate 93  Resp 19  BP (!) 118/104  SpO2 94 %  MEWS Score/Color  MEWS Score 0  MEWS Score Color [place] Si

## 2023-01-30 ENCOUNTER — Other Ambulatory Visit (HOSPITAL_COMMUNITY): Payer: Self-pay

## 2023-01-30 DIAGNOSIS — I2699 Other pulmonary embolism without acute cor pulmonale: Secondary | ICD-10-CM | POA: Diagnosis not present

## 2023-01-30 LAB — CBC
HCT: 45 % (ref 36.0–46.0)
Hemoglobin: 14.3 g/dL (ref 12.0–15.0)
MCH: 22.9 pg — ABNORMAL LOW (ref 26.0–34.0)
MCHC: 31.8 g/dL (ref 30.0–36.0)
MCV: 72.1 fL — ABNORMAL LOW (ref 80.0–100.0)
Platelets: 359 10*3/uL (ref 150–400)
RBC: 6.24 MIL/uL — ABNORMAL HIGH (ref 3.87–5.11)
RDW: 23.2 % — ABNORMAL HIGH (ref 11.5–15.5)
WBC: 14 10*3/uL — ABNORMAL HIGH (ref 4.0–10.5)
nRBC: 0 % (ref 0.0–0.2)

## 2023-01-30 LAB — GLUCOSE, CAPILLARY: Glucose-Capillary: 118 mg/dL — ABNORMAL HIGH (ref 70–99)

## 2023-01-30 LAB — PROTIME-INR
INR: 1.2 (ref 0.8–1.2)
Prothrombin Time: 15 s (ref 11.4–15.2)

## 2023-01-30 MED ORDER — APIXABAN (ELIQUIS) VTE STARTER PACK (10MG AND 5MG)
ORAL_TABLET | ORAL | 0 refills | Status: DC
  Filled 2023-01-30: qty 74, 30d supply, fill #0

## 2023-01-30 MED ORDER — APIXABAN 5 MG PO TABS: 5.0000 mg | ORAL_TABLET | Freq: Two times a day (BID) | ORAL | Status: DC

## 2023-01-30 MED ORDER — WARFARIN SODIUM 7.5 MG PO TABS
15.0000 mg | ORAL_TABLET | Freq: Once | ORAL | Status: DC
Start: 2023-01-30 — End: 2023-01-30

## 2023-01-30 MED ORDER — APIXABAN 5 MG PO TABS
10.0000 mg | ORAL_TABLET | Freq: Two times a day (BID) | ORAL | Status: DC
  Administered 2023-01-30: 10 mg via ORAL
  Filled 2023-01-30: qty 2

## 2023-01-30 NOTE — TOC Benefit Eligibility Note (Signed)
Patient Product/process development scientist completed.    The patient is insured through [employer].     Ran test claim for Eliquis 5 mg and the current 30 day co-pay is $4.00.  Ran test claim for Xarelto 20 mg and the current 30 day co-pay is $4.00.  This test claim was processed through [facility]- copay amounts may vary at other pharmacies due to pharmacy/plan contracts, or as the patient moves through the different stages of their insurance plan.     Roland Earl, CPHT Pharmacy Technician III Certified Patient Advocate [facility] Pharmacy Patient Advocate Team Direct Number: [phone]  Fax: [phone]

## 2023-01-30 NOTE — Progress Notes (Addendum)
PHARMACY - ANTICOAGULATION CONSULT NOTE  Pharmacy Consult for warfarin + lovenox bridge > change to Eliquis Indication: pulmonary embolus  Allergies  Allergen Reactions   Strawberry (Diagnostic) Hives    Fresh Fruit    Patient Measurements: Height: 5\' 2"  (157.5 cm) Weight: (!) 154.2 kg (340 lb) IBW/kg (Calculated) : 50.1 Heparin Dosing Weight: 91.1 kg  Vital Signs: Temp: 98 F (36.7 C) (11/15 0524) Temp Source: Axillary (11/15 0524) BP: 113/73 (11/15 0524) Pulse Rate: 85 (11/15 0105)  Labs: Recent Labs    01/27/23 1031 01/27/23 1227 01/28/23 0342 01/29/23 0550 01/30/23 0337  HGB  --    < > 13.7 13.8 14.3  HCT  --    < > 43.0 43.2 45.0  PLT  --    < > 345 364 359  LABPROT 13.1  --  13.9 14.4 15.0  INR 1.0  --  1.1 1.1 1.2  HEPARINUNFRC <0.10*  --   --   --   --   HEPRLOWMOCWT  --   --   --  1.16  --   CREATININE  --   --  0.64  --   --    < > = values in this interval not displayed.    Estimated Creatinine Clearance: 150.2 mL/min (by C-G formula based on SCr of 0.64 mg/dL).   Medical History: Past Medical History:  Diagnosis Date   Gestational diabetes    With first pregnancy    Assessment: Christine Singleton is a 32 y.o. year old female admitted on 01/26/2023 with concern for PE, CTA chest with probable thrombus (imaging limited by body habitus).  No anticoagulation prior to admission.  Transitioning to warfarin with Lovenox bridge given questionable efficacy with DOAC in BMI >60, weight >150 kg.    Of note, patient endorses eating 1-2 servings of vitamin K foods at home and plans to continue this at discharge.  11/14: Lovenox level slightly supratherapeutic at 1.16 with lovenox dosed 150 mg [place] Q12H. Will reduce dose by 20%.   INR slow to move at 1. following a couple of 10 mg doses. CBC stable. No signs of bleeding noted.   11/15 update - pt reports ongoing nausea with Coumadin, vomiting after Coumadin doses.  Goal of Therapy:  INR 2-3 Anti-Xa level  0.6-1 units/ml 4hrs after LMWH dose given Monitor platelets by anticoagulation protocol: Yes   Plan:  Per discussion with MD, will stop Coumadin and initiate Eliquis due to ongoing nausea. Eliquis 10 mg BID x 7 days, then 5 mg BID. Copay will be $4  Reece Leader, Colon Flattery, [facility] Clinical Pharmacist  01/30/2023 9:54 AM   [facility] pharmacy phone numbers are listed on [email or web address]

## 2023-01-30 NOTE — Plan of Care (Signed)
Patient ID: Christine Singleton, female   DOB: 1991/01/13, 32 y.o.   MRN: [id number]  Problem: Education: Goal: Knowledge of General Education information will improve Description: Including pain rating scale, medication(s)/side effects and non-pharmacologic comfort measures Outcome: Adequate for Discharge   Problem: Health Behavior/Discharge Planning: Goal: Ability to manage health-related needs will improve Outcome: Adequate for Discharge   Problem: Clinical Measurements: Goal: Ability to maintain clinical measurements within normal limits will improve Outcome: Adequate for Discharge Goal: Will remain free from infection Outcome: Adequate for Discharge Goal: Diagnostic test results will improve Outcome: Adequate for Discharge Goal: Respiratory complications will improve Outcome: Adequate for Discharge Goal: Cardiovascular complication will be avoided Outcome: Adequate for Discharge   Problem: Activity: Goal: Risk for activity intolerance will decrease Outcome: Adequate for Discharge   Problem: Nutrition: Goal: Adequate nutrition will be maintained Outcome: Adequate for Discharge   Problem: Coping: Goal: Level of anxiety will decrease Outcome: Adequate for Discharge   Problem: Elimination: Goal: Will not experience complications related to bowel motility Outcome: Adequate for Discharge Goal: Will not experience complications related to urinary retention Outcome: Adequate for Discharge   Problem: Pain Management: Goal: General experience of comfort will improve Outcome: Adequate for Discharge   Problem: Safety: Goal: Ability to remain free from injury will improve Outcome: Adequate for Discharge   Problem: Skin Integrity: Goal: Risk for impaired skin integrity will decrease Outcome: Adequate for Discharge   Problem: Education: Goal: Ability to describe self-care measures that may prevent or decrease complications (Diabetes Survival Skills Education) will  improve Outcome: Adequate for Discharge Goal: Individualized Educational Video(s) Outcome: Adequate for Discharge   Problem: Coping: Goal: Ability to adjust to condition or change in health will improve Outcome: Adequate for Discharge   Problem: Fluid Volume: Goal: Ability to maintain a balanced intake and output will improve Outcome: Adequate for Discharge   Problem: Health Behavior/Discharge Planning: Goal: Ability to identify and utilize available resources and services will improve Outcome: Adequate for Discharge Goal: Ability to manage health-related needs will improve Outcome: Adequate for Discharge   Problem: Metabolic: Goal: Ability to maintain appropriate glucose levels will improve Outcome: Adequate for Discharge   Problem: Nutritional: Goal: Maintenance of adequate nutrition will improve Outcome: Adequate for Discharge Goal: Progress toward achieving an optimal weight will improve Outcome: Adequate for Discharge   Problem: Skin Integrity: Goal: Risk for impaired skin integrity will decrease Outcome: Adequate for Discharge   Problem: Tissue Perfusion: Goal: Adequacy of tissue perfusion will improve Outcome: Adequate for Discharge    Lidia Collum, RN

## 2023-01-30 NOTE — Discharge Summary (Signed)
Physician Discharge Summary  Christine Singleton GNF:[id number] DOB: 03/17/1991 DOA: 01/26/2023  PCP: Grayce Sessions, NP  Admit date: 01/26/2023 Discharge date: 01/30/2023  Admitted From: Home Discharge disposition: Home  Recommendations at discharge:  You have been started on Eliquis.  10 mg twice daily for 7 days followed by 5 mg twice daily for at least 6 months.  Continue to see your primary care provider as an outpatient to determine if a longer course is needed.  While on blood thinner, watch out for any obvious bleeding, black stool or easy bruisability. Avoid pregnancy while on Eliquis.   Brief narrative:  Christine Singleton is a 32 y.o. female with PMH significant for morbid obesity, OSA, pulmonary hypertension, mild systolic CHF, chronic respiratory failure on 3 L nocturnal oxygen. Patient presented to the ED on 11/11 with complaint of shortness of breath and chest tightness.  He has the symptoms ongoing for few weeks.  She is on nocturnal oxygen but lately been using it during the day as well.  Dyspnea is more on exertion.  For the preceding few days, she has also been trying extra dose of Lasix and making a lot of urine despite which she continued to feel short of breath and hence presented to ED.  In the ED, patient was afebrile, initial heart rate 119, tachypneic to 28, blood pressure 112/54, initially maintaining oxygen saturation 3 L but later required BiPAP Labs with WC count 16.1, hemoglobin 13.8, potassium 3.4 BNP 26, troponin 11>9 EKG did not show ischemic changes Respiratory virus panel unremarkable CT angio chest suspected linear thrombus in the right main pulmonary artery extending from the pulmonary trunk bifurcation, and suspected subsegmental right upper and lower lobe embolic disease. Overall the suspected clot burden is small without findings of acute right heart strain.  Patient was started on heparin drip, given IV Lasix Admitted to [facility]  Of note, patient  denies any recent travel, surgery.  Denies any past history of DVT/PE.  Not on any blood thinners She tried Ozempic but stopped it after it started making her feel weird.  She is in process of getting Greggory Keen approved through her primary care provider. Patient denies being in any oral contraceptive pills.  In fact she has not had her menstrual period since the beginning of this year.  She says she was seen by GYN but has not followed up with them lately.  Urine pregnancy test negative in the ED. See below for details.  Subjective: Patient was seen and examined this morning. Lying down on bed.   Mentions nausea and vomiting every time she has been getting Coumadin here in the hospital.. Probably the reason her INR is not rising up. Discussed with pharmacy.  Will discharge her on Eliquis.  Hospital course: Acute pulmonary embolism Presented with progressively worsening shortness of breath for few weeks. CT angio chest suspected linear thrombus in the right main pulmonary artery extending from the pulmonary trunk bifurcation, and suspected subsegmental right upper and lower lobe embolic disease. Overall the suspected clot burden is small without findings of acute right heart strain. No recent travel, surgery, or personal history of malignancy. No prior history of DVT/PE.  Not on any anticoagulant prior for any other reason Echocardiogram without any RV strain. Ultrasound duplex of lower extremities negative for DVT. Patient was initially started on heparin drip and subsequently switched to Coumadin with Lovenox bridge.  DOAC was not chosen because of her super morbid obesity. However patient could not tolerate Coumadin.  She  states she had nausea and vomiting after taking Coumadin, probably why her INR level is not rising up for last 4 days on Coumadin. Discussed with patient and pharmacist.  Will plan to discharge on Eliquis.  She has been counseled about the risk of bleeding, easy bruisability  and need of self monitoring.  She has also been counseled to avoid pregnancy while on Eliquis.  Patient understands the plan.  Chronic systolic CHF Pulm hypertension Patient was given IV Lasix in the ED.  Looks euvolemic  PTA meds-Lasix, Aldactone, Jardiance.  Echocardiogram with EF 45 to 50%, LV global hypokinesis, unchanged echo from May 2024. Continue meds  Hypokalemia Improved with replacement. Recent Labs  Lab 01/26/23 1757 01/26/23 2127 01/28/23 0342  K 3.4* 4.0 3.9   Dyspnea on exertion chronic hypoxic respiratory failure  OSA On 3 L nocturnal oxygen at home.  Lately using it during the day as well. Currently not on supplemental oxygen at rest.  Continue nocturnal oxygen. Also reports that she is waiting for sleep study as an outpatient.  Type 2 diabetes mellitus A1c 6.6 on 01/27/2023 PTA meds-tried but did not tolerate Ozempic recently.  In process of getting Mounjaro approved.  Currently on Jardiance, metformin.   Okay to continue the same.  Morbid Obesity  Body mass index is 62.19 kg/m. Patient has been advised to make an attempt to improve diet and exercise patterns to aid in weight loss.  Goals of care   Code Status: Full Code   Wounds:  -    Discharge Exam:   Vitals:   01/29/23 1117 01/29/23 1940 01/30/23 0105 01/30/23 0524  BP: 102/81 (!) 118/104 123/72 113/73  Pulse: 91 93 85   Resp: 19 19 19 20   Temp: 98.3 F (36.8 C) 98.2 F (36.8 C) 98.2 F (36.8 C) 98 F (36.7 C)  TempSrc: Oral Oral Axillary Axillary  SpO2: 95% 94% 100% 99%  Weight:      Height:        Body mass index is 62.19 kg/m.  General exam: Pleasant young African-American female, morbidly obese Skin: No rashes, lesions or ulcers. HEENT: Atraumatic, normocephalic, no obvious bleeding Lungs: Clear to auscultation bilaterally CVS: Regular rate and rhythm, no murmur GI/Abd soft, distended from obesity, nontender, bowel sound present CNS: Alert, awake, oriented x 3 Psychiatry:  Mood appropriate Extremities: No pedal edema.  Wrinkles seen on both her extremities probably suggestive of improving edema.  No calf tenderness.  Follow ups:    Discharge Instructions:   Discharge Instructions     Call MD for:  difficulty breathing, headache or visual disturbances   Complete by: As directed    Call MD for:  extreme fatigue   Complete by: As directed    Call MD for:  hives   Complete by: As directed    Call MD for:  persistant dizziness or light-headedness   Complete by: As directed    Call MD for:  persistant nausea and vomiting   Complete by: As directed    Call MD for:  severe uncontrolled pain   Complete by: As directed    Call MD for:  temperature >100.4   Complete by: As directed    Diet - low sodium heart healthy   Complete by: As directed    Diet Carb Modified   Complete by: As directed    Discharge instructions   Complete by: As directed    Recommendations at discharge:   You have been started on Eliquis.  10  mg twice daily for 7 days followed by 5 mg twice daily for at least 6 months.  Continue to see your primary care provider as an outpatient to determine if a longer course is needed.  While on blood thinner, watch out for any obvious bleeding, black stool or easy bruisability.  Avoid pregnancy while on Eliquis.  Discharge instructions for CHF Check weight daily -preferably same time every day. Restrict fluid intake to 1200 ml daily Restrict salt intake to less than 2 g daily. Call MD if you have one of the following symptoms 1) 3 pound weight gain in 24 hours or 5 pounds in 1 week  2) swelling in the hands, feet or stomach  3) progressive shortness of breath 4) if you have to sleep on extra pillows at night in order to breathe     General discharge instructions: Follow with Primary MD Grayce Sessions, NP in 7 days  Please request your PCP  to go over your hospital tests, procedures, radiology results at the follow up. Please get  your medicines reviewed and adjusted.  Your PCP may decide to repeat certain labs or tests as needed. Do not drive, operate heavy machinery, perform activities at heights, swimming or participation in water activities or provide baby sitting services if your were admitted for syncope or siezures until you have seen by Primary MD or a Neurologist and advised to do so again. [place] Controlled Substance Reporting System database was reviewed. Do not drive, operate heavy machinery, perform activities at heights, swim, participate in water activities or provide baby-sitting services while on medications for pain, sleep and mood until your outpatient physician has reevaluated you and advised to do so again.  You are strongly recommended to comply with the dose, frequency and duration of prescribed medications. Activity: As tolerated with Full fall precautions use walker/cane & assistance as needed Avoid using any recreational substances like cigarette, tobacco, alcohol, or non-prescribed drug. If you experience worsening of your admission symptoms, develop shortness of breath, life threatening emergency, suicidal or homicidal thoughts you must seek medical attention immediately by calling 911 or calling your MD immediately  if symptoms less severe. You must read complete instructions/literature along with all the possible adverse reactions/side effects for all the medicines you take and that have been prescribed to you. Take any new medicine only after you have completely understood and accepted all the possible adverse reactions/side effects.  Wear Seat belts while driving. You were cared for by a hospitalist during your hospital stay. If you have any questions about your discharge medications or the care you received while you were in the hospital after you are discharged, you can call the unit and ask to speak with the hospitalist or the covering physician. Once you are discharged, your primary care  physician will handle any further medical issues. Please note that NO REFILLS for any discharge medications will be authorized once you are discharged, as it is imperative that you return to your primary care physician (or establish a relationship with a primary care physician if you do not have one).   Increase activity slowly   Complete by: As directed        Discharge Medications:   Allergies as of 01/30/2023       Reactions   Strawberry (diagnostic) Hives   Fresh Fruit        Medication List     TAKE these medications    Accu-Chek Guide test strip Generic drug: glucose blood  Use to check blood sugar once daily.   Accu-Chek Guide w/Device Kit Use to check blood sugar once daily.   Accu-Chek Softclix Lancets lancets Use to check blood sugar once daily.   acetaminophen 325 MG tablet Commonly known as: TYLENOL Take 2 tablets (650 mg total) by mouth every 6 (six) hours as needed for mild pain or moderate pain (or Fever >/= 101).   Apixaban Starter Pack (10mg  and 5mg ) Commonly known as: ELIQUIS STARTER PACK Take as directed on package: start with two-5mg  tablets twice daily for 7 days. On day 8, switch to one-5mg  tablet twice daily.   Blood Pressure Kit Please check blood pressure every morning and keep a log.   furosemide 20 MG tablet Commonly known as: LASIX Take 1 tablet (20 mg total) by mouth daily.   Jardiance 10 MG Tabs tablet Generic drug: empagliflozin Take 1 tablet (10 mg total) by mouth daily.   metFORMIN 500 MG tablet Commonly known as: GLUCOPHAGE Take 1 tablet (500 mg total) by mouth daily with breakfast.   OXYGEN Inhale 2 application  into the lungs at bedtime.   spironolactone 25 MG tablet Commonly known as: ALDACTONE Take 1 tablet (25 mg total) by mouth daily.         The results of significant diagnostics from this hospitalization (including imaging, microbiology, ancillary and laboratory) are listed below for reference.     Procedures and Diagnostic Studies:   VAS [place] LOWER EXTREMITY VENOUS (DVT)  Result Date: 01/27/2023  Lower Venous DVT Study Patient Name:  AZELL GASCHLER  Date of Exam:   01/27/2023 Medical Rec #: [id number]       Accession #:    [id number] Date of Birth: [birth year]      Patient Gender: F Patient Age:   32 years Exam Location:  [facility] Procedure:      VAS [place] LOWER EXTREMITY VENOUS (DVT) Referring Phys: Lorin Glass --------------------------------------------------------------------------------  Indications: Pulmonary embolism.  Risk Factors: Confirmed PE. Comparison Study: Previous exam of left lower extremity on 5.30.2024. Performing Technologist: Fernande Bras  Examination Guidelines: A complete evaluation includes B-mode imaging, spectral Doppler, color Doppler, and power Doppler as needed of all accessible portions of each vessel. Bilateral testing is considered an integral part of a complete examination. Limited examinations for reoccurring indications may be performed as noted. The reflux portion of the exam is performed with the patient in reverse Trendelenburg.  +---------+---------------+---------+-----------+----------+--------------+ RIGHT    CompressibilityPhasicitySpontaneityPropertiesThrombus Aging +---------+---------------+---------+-----------+----------+--------------+ CFV      Full           Yes      Yes                                 +---------+---------------+---------+-----------+----------+--------------+ SFJ      Full                                                        +---------+---------------+---------+-----------+----------+--------------+ FV Prox  Full                                                        +---------+---------------+---------+-----------+----------+--------------+  FV Mid   Full                                                        +---------+---------------+---------+-----------+----------+--------------+  FV DistalFull                                                        +---------+---------------+---------+-----------+----------+--------------+ PFV      Full                                                        +---------+---------------+---------+-----------+----------+--------------+ POP      Full           Yes      Yes                                 +---------+---------------+---------+-----------+----------+--------------+ PTV      Full                                                        +---------+---------------+---------+-----------+----------+--------------+ PERO     Full                                                        +---------+---------------+---------+-----------+----------+--------------+   +---------+---------------+---------+-----------+----------+---------------+ LEFT     CompressibilityPhasicitySpontaneityPropertiesThrombus Aging  +---------+---------------+---------+-----------+----------+---------------+ CFV      Full           Yes      Yes                                  +---------+---------------+---------+-----------+----------+---------------+ SFJ      Full                                                         +---------+---------------+---------+-----------+----------+---------------+ FV Prox  Full                                                         +---------+---------------+---------+-----------+----------+---------------+ FV Mid   Full                                                         +---------+---------------+---------+-----------+----------+---------------+  FV DistalFull                                                         +---------+---------------+---------+-----------+----------+---------------+ PFV      Full                                                         +---------+---------------+---------+-----------+----------+---------------+ POP      Full           Yes       Yes                                  +---------+---------------+---------+-----------+----------+---------------+ PTV                                                   Patent by color +---------+---------------+---------+-----------+----------+---------------+ PERO                                                  Patent by color +---------+---------------+---------+-----------+----------+---------------+     Summary: BILATERAL: - No evidence of deep vein thrombosis seen in the lower extremities, bilaterally. -No evidence of popliteal cyst, bilaterally.   *See table(s) above for measurements and observations. Electronically signed by Lemar Livings MD on 01/27/2023 at 7:02:45 PM.    Final    ECHOCARDIOGRAM COMPLETE  Result Date: 01/27/2023    ECHOCARDIOGRAM REPORT   Patient Name:   Christine Singleton Date of Exam: 01/27/2023 Medical Rec #:  [id number]      Height:       62.0 in Accession #:    [id number]     Weight:       347.0 lb Date of Birth:  1990-07-28     BSA:          2.416 m Patient Age:    31 years       BP:           116/89 mmHg Patient Gender: F              HR:           95 bpm. Exam Location:  Inpatient Procedure: 2D Echo, Cardiac Doppler and Color Doppler Indications:    abnormal ecg  History:        Patient has prior history of Echocardiogram examinations, most                 recent 08/15/2022. Risk Factors:Sleep Apnea and Hypertension.  Sonographer:    Delcie Roch RDCS Referring Phys: [id number] [facility]  Sonographer Comments: Patient is obese. Image acquisition challenging due to patient body habitus. IMPRESSIONS  1. Left ventricular ejection fraction, by estimation, is 45 to 50%. The left ventricle has mildly decreased function. The left ventricle demonstrates global hypokinesis. Left ventricular diastolic parameters were normal.  2. Right  ventricular systolic function is mildly reduced. The right ventricular size is normal. Tricuspid regurgitation signal is inadequate for  assessing PA pressure.  3. The mitral valve is normal in structure. No evidence of mitral valve regurgitation. No evidence of mitral stenosis.  4. The aortic valve is normal in structure. Aortic valve regurgitation is not visualized. No aortic stenosis is present.  5. The inferior vena cava is normal in size with greater than 50% respiratory variability, suggesting right atrial pressure of 3 mmHg. FINDINGS  Left Ventricle: Left ventricular ejection fraction, by estimation, is 45 to 50%. The left ventricle has mildly decreased function. The left ventricle demonstrates global hypokinesis. The left ventricular internal cavity size was normal in size. There is  no left ventricular hypertrophy. Left ventricular diastolic parameters were normal. Normal left ventricular filling pressure. Right Ventricle: The right ventricular size is normal. No increase in right ventricular wall thickness. Right ventricular systolic function is mildly reduced. Tricuspid regurgitation signal is inadequate for assessing PA pressure. Left Atrium: Left atrial size was normal in size. Right Atrium: Right atrial size was normal in size. Pericardium: There is no evidence of pericardial effusion. Mitral Valve: The mitral valve is normal in structure. No evidence of mitral valve regurgitation. No evidence of mitral valve stenosis. Tricuspid Valve: The tricuspid valve is normal in structure. Tricuspid valve regurgitation is trivial. No evidence of tricuspid stenosis. Aortic Valve: The aortic valve is normal in structure. Aortic valve regurgitation is not visualized. No aortic stenosis is present. Pulmonic Valve: The pulmonic valve was normal in structure. Pulmonic valve regurgitation is not visualized. No evidence of pulmonic stenosis. Aorta: The aortic root is normal in size and structure. Venous: The inferior vena cava is normal in size with greater than 50% respiratory variability, suggesting right atrial pressure of 3 mmHg. IAS/Shunts: No atrial  level shunt detected by color flow Doppler.  LEFT VENTRICLE PLAX 2D LVIDd:         4.40 cm   Diastology LVIDs:         3.10 cm   LV e' medial:    8.92 cm/s LV PW:         1.20 cm   LV E/e' medial:  9.1 LV IVS:        1.00 cm   LV e' lateral:   9.57 cm/s LVOT diam:     2.00 cm   LV E/e' lateral: 8.5 LV SV:         38 LV SV Index:   16 LVOT Area:     3.14 cm  RIGHT VENTRICLE             IVC RV Basal diam:  2.70 cm     IVC diam: 1.40 cm RV S prime:     13.10 cm/s TAPSE (M-mode): 1.6 cm LEFT ATRIUM           Index        RIGHT ATRIUM           Index LA diam:      3.20 cm 1.32 cm/m   RA Area:     14.20 cm LA Vol (A2C): 27.0 ml 11.17 ml/m  RA Volume:   32.50 ml  13.45 ml/m LA Vol (A4C): 29.9 ml 12.38 ml/m  AORTIC VALVE LVOT Vmax:   82.50 cm/s LVOT Vmean:  52.300 cm/s LVOT VTI:    0.122 m  AORTA Ao Root diam: 3.00 cm Ao Asc diam:  2.90 cm MITRAL VALVE MV Area (PHT): 4.80 cm  SHUNTS MV Decel Time: 158 msec    Systemic VTI:  0.12 m MV E velocity: 81.00 cm/s  Systemic Diam: 2.00 cm MV A velocity: 59.00 cm/s MV E/A ratio:  1.37 Armanda Magic MD Electronically signed by Armanda Magic MD Signature Date/Time: 01/27/2023/6:26:48 PM    Final    CT Angio Chest PE W/Cm &/Or Wo Cm  Result Date: 01/26/2023 CLINICAL DATA:  32 year old with history of CHF, hypertension, presents for shortness of breath progressively worsening times 1.5 weeks. Pulmonary embolism suspected. EXAM: CT ANGIOGRAPHY CHEST WITH CONTRAST TECHNIQUE: Multidetector CT imaging of the chest was performed using the standard protocol during bolus administration of intravenous contrast. Multiplanar CT image reconstructions and MIPs were obtained to evaluate the vascular anatomy. RADIATION DOSE REDUCTION: This exam was performed according to the departmental dose-optimization program which includes automated exposure control, adjustment of the mA and/or kV according to patient size and/or use of iterative reconstruction technique. CONTRAST:  OMNIPAQUE  IOHEXOL 350 MG/ML SOLN COMPARISON:  CTA chest 08/14/2022, portable chest today, portable chest 08/20/2022. FINDINGS: Cardiovascular: Stable mild-to-moderate cardiomegaly with left chamber predominance. Study is suboptimal, in part due to large body habitus causing beam hardening and in part due to breathing motion. There is a linear hypodensity however, within the right main pulmonary artery extending from the pulmonary trunk bifurcation on 302: 42-47 suspicious for a linear thrombus, and I suspect there is also subsegmental right lower lobe arterial embolic disease on 302:57 and on 03/02: 58-62. There is concern for subsegmental right upper lobe embolic disease on 601:84 and on 302:35 and on 302: 33 and 34. The findings could be being exaggerated due to motion artifact. Overall the suspected clot burden is small without findings of right heart strain and with again noted ectatic pulmonary trunk measuring 3.5 cm, unchanged, consistent with arterial hypertension. There is no pericardial effusion. The pulmonary veins, aorta and great vessels are normal. Mediastinum/Nodes: No enlarged mediastinal, hilar, or axillary lymph nodes. Thyroid gland, trachea, and esophagus demonstrate no significant findings. Both main bronchi are patent. Lungs/Pleura: There is diffuse mild bronchial thickening. There is mosaicism of the lungs consistent with air trapping and small airway disease but no focal pneumonia is evident. The lungs are otherwise clear. There is no pleural effusion, thickening or pneumothorax. Mild elevation right hemidiaphragm. Upper Abdomen: No acute abnormality. Musculoskeletal: No acute or significant osseous findings. Slight thoracic dextroscoliosis. The ribcage is intact. The visualized chest wall without mass or focal abnormality. Review of the MIP images confirms the above findings. IMPRESSION: 1. Suboptimal study due to body habitus and breathing motion. 2. Suspected linear thrombus in the right main  pulmonary artery extending from the pulmonary trunk bifurcation, and suspected subsegmental right upper and lower lobe embolic disease. Overall the suspected clot burden is small without findings of acute right heart strain. 3. Stable cardiomegaly with left chamber predominance. 4. Stable ectatic pulmonary trunk measuring 3.5 cm consistent with arterial hypertension. 5. Diffuse mild bronchial thickening, with mosaicism of the lungs consistent with air trapping and small airway disease. No focal pneumonia is evident. 6. PRA is attempting to reach the ordering physician for stat notification at the time of signing. This report will be addended when contact has been made. Electronically Signed   By: Almira Bar M.D.   On: 01/26/2023 23:00   DG Chest Port 1 View  Result Date: 01/26/2023 CLINICAL DATA:  SHOB EXAM: PORTABLE CHEST 1 VIEW COMPARISON:  Chest x-ray 08/20/2022, CT angio chest 08/14/2022 FINDINGS: Stable cardiomegaly. The heart  and mediastinal contours are unchanged. Right lower lobe patchy airspace opacity. No pulmonary edema. No pleural effusion. No pneumothorax. No acute osseous abnormality. IMPRESSION: Right lower lobe patchy airspace opacity. Followup PA and lateral chest X-ray is recommended in 3-4 weeks following therapy to ensure resolution and exclude underlying malignancy. Electronically Signed   By: Tish Frederickson M.D.   On: 01/26/2023 21:58     Labs:   Basic Metabolic Panel: Recent Labs  Lab 01/26/23 1757 01/26/23 2127 01/28/23 0342  NA 135 137 135  K 3.4* 4.0 3.9  CL 93*  --  96*  CO2 33*  --  31  GLUCOSE 175*  --  111*  BUN 11  --  9  CREATININE 0.96  --  0.64  CALCIUM 8.7*  --  9.1   GFR Estimated Creatinine Clearance: 150.2 mL/min (by C-G formula based on SCr of 0.64 mg/dL). Liver Function Tests: No results for input(s): "AST", "ALT", "ALKPHOS", "BILITOT", "PROT", "ALBUMIN" in the last 168 hours. No results for input(s): "LIPASE", "AMYLASE" in the last 168  hours. No results for input(s): "AMMONIA" in the last 168 hours. Coagulation profile Recent Labs  Lab 01/26/23 1757 01/27/23 1031 01/28/23 0342 01/29/23 0550 01/30/23 0337  INR 1.0 1.0 1.1 1.1 1.2    CBC: Recent Labs  Lab 01/26/23 1757 01/26/23 2127 01/27/23 1227 01/28/23 0342 01/29/23 0550 01/30/23 0337  WBC 16.1*  --  14.7* 13.9* 12.5* 14.0*  HGB 13.8 16.0* 14.5 13.7 13.8 14.3  HCT 43.3 47.0* 45.4 43.0 43.2 45.0  MCV 71.8*  --  72.1* 71.7* 72.0* 72.1*  PLT 390  --  408* 345 364 359   Cardiac Enzymes: No results for input(s): "CKTOTAL", "CKMB", "CKMBINDEX", "TROPONINI" in the last 168 hours. BNP: Invalid input(s): "POCBNP" CBG: Recent Labs  Lab 01/29/23 0559 01/29/23 1116 01/29/23 1601 01/29/23 2041 01/30/23 0554  GLUCAP 112* 103* 95 128* 118*   D-Dimer No results for input(s): "DDIMER" in the last 72 hours. Hgb A1c Recent Labs    01/27/23 1031  HGBA1C 6.6*   Lipid Profile No results for input(s): "CHOL", "HDL", "LDLCALC", "TRIG", "CHOLHDL", "LDLDIRECT" in the last 72 hours. Thyroid function studies No results for input(s): "TSH", "T4TOTAL", "T3FREE", "THYROIDAB" in the last 72 hours.  Invalid input(s): "FREET3" Anemia work up No results for input(s): "VITAMINB12", "FOLATE", "FERRITIN", "TIBC", "IRON", "RETICCTPCT" in the last 72 hours. Microbiology Recent Results (from the past 240 hour(s))  Resp panel by RT-PCR (RSV, Flu A&B, Covid) Anterior Nasal Swab     Status: None   Collection Time: 01/27/23 12:44 AM   Specimen: Anterior Nasal Swab  Result Value Ref Range Status   SARS Coronavirus 2 by RT PCR NEGATIVE NEGATIVE Final    Comment: (NOTE) SARS-CoV-2 target nucleic acids are NOT DETECTED.  The SARS-CoV-2 RNA is generally detectable in upper respiratory specimens during the acute phase of infection. The lowest concentration of SARS-CoV-2 viral copies this assay can detect is 138 copies/mL. A negative result does not preclude  SARS-Cov-2 infection and should not be used as the sole basis for treatment or other patient management decisions. A negative result may occur with  improper specimen collection/handling, submission of specimen other than nasopharyngeal swab, presence of viral mutation(s) within the areas targeted by this assay, and inadequate number of viral copies(<138 copies/mL). A negative result must be combined with clinical observations, patient history, and epidemiological information. The expected result is Negative.  Fact Sheet for Patients:  BloggerCourse.com  Fact Sheet for Healthcare Providers:  SeriousBroker.it  This test is no t yet approved or cleared by the [place] and  has been authorized for detection and/or diagnosis of SARS-CoV-2 by FDA under an Emergency Use Authorization (EUA). This EUA will remain  in effect (meaning this test can be used) for the duration of the COVID-19 declaration under Section 564(b)(1) of the Act, 21 U.S.C.section 360bbb-3(b)(1), unless the authorization is terminated  or revoked sooner.       Influenza A by PCR NEGATIVE NEGATIVE Final   Influenza B by PCR NEGATIVE NEGATIVE Final    Comment: (NOTE) The Xpert Xpress SARS-CoV-2/FLU/RSV plus assay is intended as an aid in the diagnosis of influenza from Nasopharyngeal swab specimens and should not be used as a sole basis for treatment. Nasal washings and aspirates are unacceptable for Xpert Xpress SARS-CoV-2/FLU/RSV testing.  Fact Sheet for Patients: BloggerCourse.com  Fact Sheet for Healthcare Providers: SeriousBroker.it  This test is not yet approved or cleared by the [place] FDA and has been authorized for detection and/or diagnosis of SARS-CoV-2 by FDA under an Emergency Use Authorization (EUA). This EUA will remain in effect (meaning this test can be used) for the duration of  the COVID-19 declaration under Section 564(b)(1) of the Act, 21 U.S.C. section 360bbb-3(b)(1), unless the authorization is terminated or revoked.     Resp Syncytial Virus by PCR NEGATIVE NEGATIVE Final    Comment: (NOTE) Fact Sheet for Patients: BloggerCourse.com  Fact Sheet for Healthcare Providers: SeriousBroker.it  This test is not yet approved or cleared by the [place] FDA and has been authorized for detection and/or diagnosis of SARS-CoV-2 by FDA under an Emergency Use Authorization (EUA). This EUA will remain in effect (meaning this test can be used) for the duration of the COVID-19 declaration under Section 564(b)(1) of the Act, 21 U.S.C. section 360bbb-3(b)(1), unless the authorization is terminated or revoked.  Performed at [facility], [address] [place], [place] [address]     Time coordinating discharge: 45 minutes  Signed: Lorin Glass  [facility] 01/30/2023, 10:17 AM

## 2023-01-30 NOTE — TOC Transition Note (Signed)
Transition of Care [facility]) - CM/SW Discharge Note   Patient Details  Name: Christine Singleton MRN: [id number] Date of Birth: 06-25-1990  [facility] CM/SW Contact:  Leone Haven, RN Phone Number: 01/30/2023, 10:22 AM   Clinical Narrative:    For dc today, co pay for eliquis is 4.00 per benefit check.  She has transportation home.  She also has scheduled transport for MD apts as well.   Final next level of care: Home/Self Care Barriers to Discharge: Continued Medical Work up   Patient Goals and CMS Choice   Choice offered to / list presented to : NA  Discharge Placement                         Discharge Plan and Services Additional resources added to the After Visit Summary for   In-house Referral: NA Discharge Planning Services: CM Consult Post Acute Care Choice: NA          DME Arranged: N/A DME Agency: NA       HH Arranged: NA          Social Determinants of Health (SDOH) Interventions SDOH Screenings   Food Insecurity: No Food Insecurity (01/27/2023)  Housing: Patient Declined (01/27/2023)  Transportation Needs: Unmet Transportation Needs (01/27/2023)  Utilities: Not At Risk (01/27/2023)  Alcohol Screen: Low Risk  (08/18/2022)  Depression (PHQ2-9): High Risk (09/05/2022)  Financial Resource Strain: High Risk (09/08/2022)  Tobacco Use: Low Risk  (01/26/2023)  Recent Concern: Tobacco Use - Medium Risk (12/04/2022)   Received from [facility]     Readmission Risk Interventions     No data to display

## 2023-02-02 ENCOUNTER — Telehealth: Payer: Self-pay

## 2023-02-02 NOTE — Transitions of Care (Post Inpatient/ED Visit) (Signed)
   02/02/2023  Name: Christine Singleton MRN: [id number] DOB: 1990-09-11  Today's TOC FU Call Status: Today's TOC FU Call Status:: Successful TOC FU Call Completed TOC FU Call Complete Date: 02/02/23 Patient's Name and Date of Birth confirmed.  Transition Care Management Follow-up Telephone Call Date of Discharge: 01/30/23 Discharge Facility: [facility]) Type of Discharge: Inpatient Admission Primary Inpatient Discharge Diagnosis:: PE How have you been since you were released from the hospital?: Better Any questions or concerns?: Yes Patient Questions/Concerns:: She said she had a sleep study done and has not received the results yet.  She said that the technician at the sleep lab told her that she may need another sleep study.  I explained that the CPAP titration may be ordered and I will send a message to PCP about the results. Patient Questions/Concerns Addressed: Notified Provider of Patient Questions/Concerns  Items Reviewed: Did you receive and understand the discharge instructions provided?: Yes Medications obtained,verified, and reconciled?: No Medications Not Reviewed Reasons:: Other: (She said she has all of her medications and did not have any questions about the med regime. She said the hospital staff reviewed the instructions thoroughly prior to discharge. She has a glucometer but no BP monitor.) Any new allergies since your discharge?: No Dietary orders reviewed?: Yes Type of Diet Ordered:: heart healthy, low sodium, diabetic.  /day fluid restriction Do you have support at home?: Yes People in Home: parent(s) Name of Support/Comfort Primary Source: her mother and grandmother  Medications Reviewed Today: Medications Reviewed Today   Medications were not reviewed in this encounter     Home Care and Equipment/Supplies: Were Home Health Services Ordered?: No Any new equipment or medical supplies ordered?: No  Functional Questionnaire: Do you need assistance  with bathing/showering or dressing?: No (She stated she has a scale and weighs herself daily and keeps log of results.  She also has O2 that she uses @ 3L at night.) Do you need assistance with meal preparation?: No Do you need assistance with eating?: No Do you have difficulty maintaining continence: No Do you need assistance with getting out of bed/getting out of a chair/moving?: No Do you have difficulty managing or taking your medications?: No  Follow up appointments reviewed: PCP Follow-up appointment confirmed?: Yes Date of PCP follow-up appointment?: 02/10/23 Follow-up Provider: Gwinda Passe, NP Specialist Hospital Follow-up appointment confirmed?: Yes Date of Specialist follow-up appointment?: 02/05/23 Follow-Up Specialty Provider:: cardiology and 02/06/2023- Georgiana [place], [facility] Do you need transportation to your follow-up appointment?: No Do you understand care options if your condition(s) worsen?: Yes-patient verbalized understanding    SIGNATURE. Robyne Peers, RN

## 2023-02-02 NOTE — Telephone Encounter (Signed)
From [facility] call:  She said she had a sleep study done and has not received the results yet. She said that the technician at the sleep lab told her that she may need another sleep study. I explained that the CPAP titration may be ordered and I will send a message to PCP.  She has a scale and glucometer at home but she does not have a BP monitor

## 2023-02-03 NOTE — Telephone Encounter (Signed)
I called the patient and explained that the order for the titration has been placed.  She asked me how long it would take for the sleep center to schedule that appointment and I told her that I was not sure. She said she  has the phone number for the sleep center and will call and inquire.

## 2023-02-04 NOTE — Progress Notes (Deleted)
  Cardiology Office Note:  .   Date:  02/04/2023  ID:  Christine Singleton, DOB Aug 08, 1990, MRN [id number] PCP: Grayce Sessions, NP  [facility] Providers Cardiologist:  None { Click to update primary MD,subspecialty MD or APP then REFRESH:1}   History of Present Illness: .   Christine Singleton is a 32 y.o. 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  She was in the hospital for shortness of breath and chest tightness.  She initially presented tachycardic and had respiratory distress requiring BiPAP.  Her white count was 16.  A CT angio chest showed a possible linear thrombus in the right main pulmonary artery.  She was therefore given IV Lasix and started on heparin drip.  She had no signs of right heart strain  ROS: ***  Studies Reviewed: .        *** Risk Assessment/Calculations:        Physical Exam:   VS:  There were no vitals taken for this visit.   Wt Readings from Last 3 Encounters:  01/29/23 (!) 340 lb (154.2 kg)  01/07/23 (!) 347 lb (157.4 kg)  09/17/22 (!) 345 lb 6.4 oz (156.7 kg)    GEN: Well nourished, well developed in no acute distress NECK: No JVD; No carotid bruits CARDIAC: ***RRR, no murmurs, rubs, gallops RESPIRATORY:  Clear to auscultation without rales, wheezing or rhonchi  ABDOMEN: Soft, non-tender, non-distended EXTREMITIES:  No edema; No deformity   ASSESSMENT AND PLAN: .   Chronic diastolic heart failure Pulmonary HTN Group II/III OSA/ Continue Aldactone and Jardiance   PE Unknown if provoked or unprovoked  -Continue Eliquis  Obesity BMI of 62 Working on Adult nurse. She really needs a gastric bypass    {Are you ordering a CV Procedure (e.g. stress test, cath, DCCV, TEE, etc)?   Press F2        :[id number]}  Dispo: ***  Signed, Maisie Fus, MD

## 2023-02-05 ENCOUNTER — Ambulatory Visit: Payer: Medicaid Other | Admitting: Internal Medicine

## 2023-02-05 NOTE — Progress Notes (Signed)
S:    32 y.o. female who presents for diabetes evaluation, education, and management. PMH significant for T2DM w/ h/o GDM in 2014, HFmrEF (EF 45-50%), pulmonary HTN, chronic respiratory failure on 3L nocturnal O2, and morbid obesity. No hx of thyroid cancer. No personal hx of pancreatitis. No CKD or clinical ASCVD history.  Patient was referred and last seen by Primary Care Provider, Gwinda Passe 09/09/22. [facility] on 11/28/22. Last pharmacist visit on 01/05/23. Patient reported not picking up Trulicity yet due to being busy with kids at home. Reported cash price of CGM was not affordable for her at that time.  Of note, patient was recently hospitalized 11/11-11/15/24 for a PE and was discharged on Eliquis.  Today, patient arrives in good spirits. Reports adherence with metformin and Jardiance. Ran out of Jardiance yesterday, so will be stopping by pharmacy to pick up refill today. Has not started the Trulicity as she was waiting for clearance from her PCP before starting it given her recent PE and new Eliquis. In days prior to hospitalization, was not taking medications consistently. Since being discharged, has gotten back to taking meds consistently and notices that she feels much better. Reports she has had some troubles affording Medicaid copays for her medications as she is currently unemployed. Notes there are several meds ready for pick-up but they're $30 total, which is too much for her at this time.  Family/Social History:  Fhx: clinical ASCVD  Tobacco: never smoker  Alcohol: none  Has 2 children: 7 and 10YO  Current diabetes medications include: Jardiance 10 mg daily, metformin 500 mg daily, Trulicity 0.75 mg weekly (not started) Previous diabetes medications: Ozempic (nausea and constipation for 1-2 days after injections)  > Attempted to switch Ozempic to [facility] 11/2022 however insurance required trial and failure of multiple preferred options first so Trulicity was  initiated  Insurance coverage: [place] Medicaid  Patient denies hypoglycemic events.  Reported home fasting blood sugars: checks 1x/day in the AM Usually 100s, nothing > 200 Maybe a little higher lately as she has sometimes been eating later at night  Patient reports nocturia (nighttime urination) has almost resolved. Suspects this is due to being diuresed in the hospital. Patient denies neuropathy (nerve pain). Patient reports vision changes - new blurry vision on occasion, feels like someone just put pressure on her eye. Patient denies self foot exams.   Patient reported dietary habits: Eats 3 meals/day -Started eating 3 meals/day in the past 2-3 months  Patient-reported exercise habits:  -None reported   O:  Lab Results  Component Value Date   HGBA1C 6.6 (H) 01/27/2023   There were no vitals filed for this visit.  Lipid Panel  No results found for: "CHOL", "TRIG", "HDL", "CHOLHDL", "VLDL", "LDLCALC", "LDLDIRECT"  Clinical Atherosclerotic Cardiovascular Disease (ASCVD): No  The ASCVD Risk score (Arnett DK, et al., 2019) failed to calculate for the following reasons:   The 2019 ASCVD risk score is only valid for ages 4 to 43   A/P: Diabetes longstanding, controlled based on current A1c of 6.6% (01/2023), which has decreased from previous A1c of 7.1% (08/2022). Commended patient for being at goal. Informed patient she was okay to start Trulicity today and patient was agreeable. Reeducated on injection technique and how to mitigate side effects. Script sent to [facility]. Also let patient know of her right to pick up medications even if she can't afford her copay with Medicaid. Advised patient to inform PCP of new vision changes  at next visit. Patient is able to verbalize appropriate hypoglycemia management plan. Medication adherence appears appropriate. -Start Trulicity at 0.75 mg weekly -Continue metformin 500 mg daily -Continue Jardiance 10 mg daily -Extensively  discussed pathophysiology of diabetes, recommended lifestyle interventions, dietary effects on blood sugar control.  -Counseled on s/sx of and management of hypoglycemia.  -Next A1c anticipated 04/2023  Written patient instructions provided. Patient verbalized understanding of treatment plan.  Total time counseling in person: 30 minutes.    Follow-up:  Pharmacist: 03/13/23 PCP: 02/10/23  Nicole Kindred, PharmD PGY1 Pharmacy Resident 02/06/2023 11:31 AM

## 2023-02-06 ENCOUNTER — Encounter: Payer: Self-pay | Admitting: Pharmacist

## 2023-02-06 ENCOUNTER — Other Ambulatory Visit (HOSPITAL_BASED_OUTPATIENT_CLINIC_OR_DEPARTMENT_OTHER): Payer: Self-pay

## 2023-02-06 ENCOUNTER — Ambulatory Visit: Payer: Medicaid Other | Attending: Family Medicine | Admitting: Pharmacist

## 2023-02-06 DIAGNOSIS — Z7985 Long-term (current) use of injectable non-insulin antidiabetic drugs: Secondary | ICD-10-CM

## 2023-02-06 DIAGNOSIS — E119 Type 2 diabetes mellitus without complications: Secondary | ICD-10-CM | POA: Diagnosis not present

## 2023-02-06 DIAGNOSIS — Z7984 Long term (current) use of oral hypoglycemic drugs: Secondary | ICD-10-CM | POA: Diagnosis not present

## 2023-02-06 MED ORDER — TRULICITY 0.75 MG/0.5ML ~~LOC~~ SOAJ
0.7500 mg | SUBCUTANEOUS | 1 refills | Status: DC
  Filled 2023-02-06: qty 2, 28d supply, fill #0
  Filled 2023-03-10 – 2023-04-11 (×2): qty 2, 28d supply, fill #1
  Filled 2023-04-20 (×2): qty 2, 28d supply, fill #0

## 2023-02-09 ENCOUNTER — Telehealth (INDEPENDENT_AMBULATORY_CARE_PROVIDER_SITE_OTHER): Payer: Self-pay | Admitting: Primary Care

## 2023-02-09 NOTE — Telephone Encounter (Signed)
Called pt to remind them about apt.

## 2023-02-10 ENCOUNTER — Encounter (INDEPENDENT_AMBULATORY_CARE_PROVIDER_SITE_OTHER): Payer: Self-pay | Admitting: Primary Care

## 2023-02-10 ENCOUNTER — Telehealth: Payer: Self-pay | Admitting: Internal Medicine

## 2023-02-10 ENCOUNTER — Ambulatory Visit (INDEPENDENT_AMBULATORY_CARE_PROVIDER_SITE_OTHER): Payer: Medicaid Other | Admitting: Primary Care

## 2023-02-10 VITALS — BP 129/90 | HR 110 | Resp 16 | Wt 340.0 lb

## 2023-02-10 DIAGNOSIS — Z09 Encounter for follow-up examination after completed treatment for conditions other than malignant neoplasm: Secondary | ICD-10-CM

## 2023-02-10 DIAGNOSIS — I5022 Chronic systolic (congestive) heart failure: Secondary | ICD-10-CM

## 2023-02-10 DIAGNOSIS — G4733 Obstructive sleep apnea (adult) (pediatric): Secondary | ICD-10-CM

## 2023-02-10 DIAGNOSIS — E66813 Obesity, class 3: Secondary | ICD-10-CM | POA: Diagnosis not present

## 2023-02-10 DIAGNOSIS — I10 Essential (primary) hypertension: Secondary | ICD-10-CM | POA: Diagnosis not present

## 2023-02-10 DIAGNOSIS — I2699 Other pulmonary embolism without acute cor pulmonale: Secondary | ICD-10-CM

## 2023-02-10 NOTE — Patient Instructions (Signed)
Avoid using multisymptom OTC cold preparations that generally contain sudafed which can rise BP. Consult with pharmacist on best cold relief products to use for persons with HTN

## 2023-02-10 NOTE — Progress Notes (Signed)
[facility]   Subjective:   [name] is a 32 y.o. female presents for hospital follow up. Patient presented yo the ED with shortness of breath for 1 and 1/2  weeks with chest tightness. Admit date to the hospital was 01/26/23, patient was discharged from the hospital on 01/30/23, patient was admitted for: Pulmonary embolism started on Eliquis. Today she denies headaches, chest pain or lower extremity edema, sudden onset, vision changes, unilateral weakness, dizziness, paresthesias. She shortness of breath only with exertion. Consulted with pulmonologist noted as follows 1) PE dx'd in November,, 2024- provocative risks include obesity. She is on Eliquis and should continue minimum of 6 months. Ensure she is not pregnant.   2) She has severe obstructive sleep apnea with significant nocturnal hypoxemia. Recommend- order CPAP to BIPAP titration sleep study at sleep disorders center. This will define what thera peutic PAP pressure is needed, and whether supplemental oxygen will be needed.  Past Medical History:  Diagnosis Date   Gestational diabetes    With first pregnancy     Allergies  Allergen Reactions   Strawberry (Diagnostic) Hives    Fresh Fruit      Current Outpatient Medications on File Prior to Visit  Medication Sig Dispense Refill   Accu-Chek Softclix Lancets lancets Use to check blood sugar once daily. 100 each 6   acetaminophen (TYLENOL) 325 MG tablet Take 2 tablets (650 mg total) by mouth every 6 (six) hours as needed for mild pain or moderate pain (or Fever >/= 101).     APIXABAN (ELIQUIS) VTE STARTER PACK (10MG  AND 5MG ) Take as directed on package: start with two-5mg  tablets twice daily for 7 days. On day 8, switch to one-5mg  tablet twice daily. 74 each 0   Blood Glucose Monitoring Suppl (ACCU-CHEK GUIDE) w/Device KIT Use to check blood sugar once daily. 1 kit 0   Blood Pressure KIT Please check blood pressure every morning and keep a log. (Patient  not taking: Reported on 09/05/2022) 1 kit 0   Dulaglutide (TRULICITY) 0.75 MG/0.5ML SOAJ Inject 0.75 mg into the skin once a week. 2 mL 1   empagliflozin (JARDIANCE) 10 MG TABS tablet Take 1 tablet (10 mg total) by mouth daily. 30 tablet 3   furosemide (LASIX) 20 MG tablet Take 1 tablet (20 mg total) by mouth daily. 30 tablet 3   glucose blood (ACCU-CHEK GUIDE) test strip Use to check blood sugar once daily. 100 each 6   metFORMIN (GLUCOPHAGE) 500 MG tablet Take 1 tablet (500 mg total) by mouth daily with breakfast. 90 tablet 1   OXYGEN Inhale 2 application  into the lungs at bedtime.     spironolactone (ALDACTONE) 25 MG tablet Take 1 tablet (25 mg total) by mouth daily. 30 tablet 3   No current facility-administered medications on file prior to visit.     Review of System: Comprehensive ROS Pertinent positive and negative noted in HPI    Objective:  BP (!) 129/90 (BP Location: Right Arm, Patient Position: Sitting, Cuff Size: Normal)   Pulse (!) 110   Resp 16   Wt (!) 340 lb (154.2 kg)   SpO2 95%   BMI 62.19 kg/m   Filed Weights   02/10/23 1557  Weight: (!) 340 lb (154.2 kg)    Physical Exam: General Appearance: severely morbid obese Body mass index is 62.19 kg/m.  in no apparent distress. Eyes: PERRLA, EOMs, conjunctiva no swelling or erythema Sinuses: No Frontal/maxillary tenderness ENT/Mouth: Ext aud canals clear, TMs without  erythema, bulging. Marland Kitchen Hearing normal.  Neck: thick Supple, thyroid normal.  Respiratory: Respiratory effort normal, BS equal bilaterally without rales, rhonchi, wheezing or stridor.  Cardio: RRR with no MRGs. Brisk peripheral pulses without edema.  Abdomen: Soft, + BS.  Non tender, no guarding, rebound, hernias, masses. Lymphatics: Non tender without lymphadenopathy.  Musculoskeletal: Full ROM, 5/5 strength, normal gait.  Skin: Warm, dry without rashes, lesions, ecchymosis.  Neuro: Cranial nerves intact. Normal muscle tone, no cerebellar symptoms.  Sensation intact.  Psych: Awake and oriented X 3, normal affect, Insight and Judgment appropriate.    Assessment:  [name] was seen today for hospitalization follow-up.  Diagnoses and all orders for this visit:  OSA (obstructive sleep apnea) Order placed per pulmonology recommendation   Class 3 obesity Body mass index is 62.19 kg/m.  Other co morbidities factor into weight CHF  Essential hypertension 2/2 Chronic systolic heart failure (HCC)  Scheduled appt with Dr. Wyline Mood January 15, @ 240   Acute pulmonary embolism, unspecified pulmonary embolism type, unspecified whether acute cor pulmonale present [facility])  PE dx'd in November,, 2024- provocative risks include obesity. She is on Eliquis and should continue minimum of 6 months. Ensure she is not pregnant.  Hospital discharge follow-up See HPI   This note has been created with Dragon speech recognition software and Paediatric nurse. Any transcriptional errors are unintentional.   Grayce Sessions, NP 02/10/2023, 4:14 PM

## 2023-02-10 NOTE — Telephone Encounter (Signed)
I was asked for advice by Gwinda Passe, NP. I have not examined this patient.  1) PE dx'd in November,, 2024- provocative risks include obesity. She is on Eliquis and should continue minimum of 6 months. Ensure she is not pregnant.  2) She has severe obstructive sleep apnea with significant nocturnal hypoxemia. Recommend- order CPAP to BIPAP titration sleep study at sleep disorders center. This will define what thera peutic PAP pressure is needed, and whether supplemental oxygen will be needed.

## 2023-02-10 NOTE — Telephone Encounter (Signed)
Christine Singleton asked management advice. I have not examined this patient. 1) She had PE dx'd by CT November, 2024. Provoking factors at least include obesity.    She is now on Eliquis. A) ensure she is not pregnant B) Continue anticoagulation AT Least 6 months.   2)

## 2023-02-17 ENCOUNTER — Inpatient Hospital Stay (INDEPENDENT_AMBULATORY_CARE_PROVIDER_SITE_OTHER): Payer: Medicaid Other | Admitting: Primary Care

## 2023-03-09 ENCOUNTER — Ambulatory Visit (HOSPITAL_BASED_OUTPATIENT_CLINIC_OR_DEPARTMENT_OTHER): Payer: Medicaid Other | Attending: Primary Care | Admitting: Internal Medicine

## 2023-03-09 DIAGNOSIS — G4733 Obstructive sleep apnea (adult) (pediatric): Secondary | ICD-10-CM | POA: Diagnosis present

## 2023-03-10 ENCOUNTER — Other Ambulatory Visit (HOSPITAL_BASED_OUTPATIENT_CLINIC_OR_DEPARTMENT_OTHER): Payer: Self-pay

## 2023-03-12 ENCOUNTER — Other Ambulatory Visit: Payer: Self-pay

## 2023-03-13 ENCOUNTER — Ambulatory Visit: Payer: Medicaid Other | Admitting: Pharmacist

## 2023-03-19 ENCOUNTER — Telehealth: Payer: Self-pay | Admitting: Primary Care

## 2023-03-19 NOTE — Telephone Encounter (Signed)
 Copied from CRM [id number]. Topic: General - Inquiry >> Mar 19, 2023  3:19 PM [name] wrote: Reason for CRM: Pt requesting results of sleep study that was done on 03/09/2023. Pt states she is really needing her machine and would like to know results before her visit with Rosaline Bohr, NP on 04/13/2023.  Pt requesting a call as soon as they come in.

## 2023-03-20 NOTE — Telephone Encounter (Signed)
 Will forward to provider

## 2023-03-21 DIAGNOSIS — G4733 Obstructive sleep apnea (adult) (pediatric): Secondary | ICD-10-CM

## 2023-03-23 NOTE — Telephone Encounter (Signed)
Will forward to Dr. Maple Hudson.

## 2023-03-24 ENCOUNTER — Other Ambulatory Visit (HOSPITAL_BASED_OUTPATIENT_CLINIC_OR_DEPARTMENT_OTHER): Payer: Self-pay

## 2023-03-25 NOTE — Progress Notes (Deleted)
 S:     No chief complaint on file.  33 y.o. 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   Patient was referred by Primary Care Provider, Rosaline Bohr, on 09/09/2022. She was last seen by PCP on 02/10/2023 for post admission follow up. On 01/26/2023 she was admitted for PE. She was discharged on 01/30/2023 after starting Eliquis . During the PCP visit she was also given a referral for OSA sleep study. This was completed on 03/09/2023.   At last pharmacy visit on 02/06/2023   Patient reports Diabetes was diagnosed in ***.   Patient arrives in *** good spirits and presents without *** any assistance. ***Patient is accompanied by ***.   Family/Social History:  -Family History: clinical ASCVD -Tobacco: Never smoker  -Alcohol: -Has 2 children (72 years old and 27 years old)  Current diabetes medications include:  -Trulicity  0.75 weekly  -Metformin  500 mg every day -Jardiance  10 mg every day   Previous diabetes medications: -Ozempic  (nausea and constipation for 1-2 days after injections)  -Mounjaro (never started due to insurance requiring failure of multiple options first)  Patient reports adherence to taking all medications as prescribed.  *** Patient denies adherence with medications, reports missing *** medications *** times per week, on average.  Do you feel that your medications are working for you? {YES NO:22349} Have you been experiencing any side effects to the medications prescribed? {YES NO:22349} Do you have any problems obtaining medications due to transportation or finances? {YES [id number] Insurance coverage: [place] Medicaid   Patient {Actions; denies-reports:120008} hypoglycemic events.  Reported home fasting blood sugars: ***  Reported 2 hour post-meal/random blood sugars: ***.  Patient {Actions;  denies-reports:120008} nocturia (nighttime urination).  Patient {Actions; denies-reports:120008} neuropathy (nerve pain). Patient {Actions; denies-reports:120008} visual changes. Patient {Actions; denies-reports:120008} self foot exams.   Patient reported dietary habits: Eats *** meals/day Breakfast: *** Lunch: *** Dinner: *** Snacks: *** Drinks: ***  Within the past 12 months, did you worry whether your food would run out before you got money to buy more? {YES NO:22349} Within the past 12 months, did the food you bought run out, and you didn't have money to get more? {YES NO:22349}   Patient-reported exercise habits: ***   O:   7 day average blood glucose: ***  Libre3 CGM Download today *** % Time CGM is active: ***% Average Glucose: *** mg/dL Glucose Management Indicator: ***  Glucose Variability: ***% (goal <36%) Time in Goal:  - Time in range 70-180: ***% - Time above range: ***% - Time below range: ***% Observed patterns:   Lab Results  Component Value Date   HGBA1C 6.6 (H) 01/27/2023   There were no vitals filed for this visit.  Lipid Panel  No results found for: CHOL, TRIG, HDL, CHOLHDL, VLDL, LDLCALC, LDLDIRECT  Clinical Atherosclerotic Cardiovascular Disease (ASCVD): {YES/NO:21197} The ASCVD Risk score (Arnett DK, et al., 2019) failed to calculate for the following reasons:   The 2019 ASCVD risk score is only valid for ages 57 to 89   Patient is participating in a Managed Medicaid Plan:  Yes   A/P: Diabetes longstanding *** currently ***. Last A1c at goal at 6.6 (goal <7). Patient is *** able to verbalize appropriate hypoglycemia management plan. Medication adherence appears ***. Control is suboptimal due to ***. -{Meds adjust:18428} basal insulin  *** Lantus/Basaglar/Semglee (insulin  glargine) *** Tresiba (insulin  degludec) from ***  units to *** units daily in the morning. Patient will continue to titrate 1 unit every *** days if fasting  blood sugar > 100mg /dl until fasting blood sugars reach goal or next visit.  -{Meds adjust:18428} rapid insulin  *** Novolog  (insulin  aspart) *** Humalog (insulin  lispro) from *** to ***.  -{Meds adjust:18428} GLP-1 *** Trulicity  (dulaglutide ) *** Ozempic  (semaglutide ) *** Mounjaro (tirzepatide) from *** mg to *** mg .  -{Meds adjust:18428} SGLT2-I *** Farxiga (dapagliflozin) *** Jardiance  (empagliflozin ) 10 mg. Counseled on sick day rules. -{Meds adjust:18428} metformin  ***.  -Patient educated on purpose, proper use, and potential adverse effects of ***.  -Extensively discussed pathophysiology of diabetes, recommended lifestyle interventions, dietary effects on blood sugar control.  -Counseled on s/sx of and management of hypoglycemia.  -Next A1c anticipated ***.   ASCVD risk - primary ***secondary prevention in patient with diabetes. Last LDL is *** not at goal of <29 *** mg/dL. ASCVD risk factors include *** and 10-year ASCVD risk score of ***. {Desc; low/moderate/high:110033} intensity statin indicated.  -{Meds adjust:18428} ***statin *** mg.    Written patient instructions provided. Patient verbalized understanding of treatment plan.  Total time in face to face counseling *** minutes.    Follow-up:  Pharmacist *** PCP clinic visit in *** Patient seen with ***

## 2023-03-26 ENCOUNTER — Ambulatory Visit: Payer: Medicaid Other | Admitting: Pharmacist

## 2023-03-30 ENCOUNTER — Emergency Department (HOSPITAL_COMMUNITY)
Admission: EM | Admit: 2023-03-30 | Discharge: 2023-03-31 | Discharge status: Against Medical Advice | Payer: Medicaid Other | Attending: Emergency Medicine | Admitting: Emergency Medicine

## 2023-03-30 ENCOUNTER — Emergency Department (HOSPITAL_COMMUNITY): Payer: Medicaid Other

## 2023-03-30 ENCOUNTER — Encounter (HOSPITAL_COMMUNITY): Payer: Self-pay | Admitting: Emergency Medicine

## 2023-03-30 DIAGNOSIS — R0602 Shortness of breath: Secondary | ICD-10-CM | POA: Insufficient documentation

## 2023-03-30 DIAGNOSIS — Z5321 Procedure and treatment not carried out due to patient leaving prior to being seen by health care provider: Secondary | ICD-10-CM | POA: Diagnosis not present

## 2023-03-30 DIAGNOSIS — I509 Heart failure, unspecified: Secondary | ICD-10-CM | POA: Insufficient documentation

## 2023-03-30 DIAGNOSIS — R059 Cough, unspecified: Secondary | ICD-10-CM | POA: Insufficient documentation

## 2023-03-30 LAB — BASIC METABOLIC PANEL
Anion gap: 12 (ref 5–15)
BUN: 9 mg/dL (ref 6–20)
CO2: 29 mmol/L (ref 22–32)
Calcium: 9.3 mg/dL (ref 8.9–10.3)
Chloride: 96 mmol/L — ABNORMAL LOW (ref 98–111)
Creatinine, Ser: 0.82 mg/dL (ref 0.44–1.00)
GFR, Estimated: >60 mL/min (ref >60)
Glucose, Bld: 151 mg/dL — ABNORMAL HIGH (ref 70–99)
Potassium: 4.3 mmol/L (ref 3.5–5.1)
Sodium: 137 mmol/L (ref 135–145)

## 2023-03-30 LAB — CBC
HCT: 47 % — ABNORMAL HIGH (ref 36.0–46.0)
Hemoglobin: 15.1 g/dL — ABNORMAL HIGH (ref 12.0–15.0)
MCH: 23.5 pg — ABNORMAL LOW (ref 26.0–34.0)
MCHC: 32.1 g/dL (ref 30.0–36.0)
MCV: 73.1 fL — ABNORMAL LOW (ref 80.0–100.0)
Platelets: 409 10*3/uL — ABNORMAL HIGH (ref 150–400)
RBC: 6.43 MIL/uL — ABNORMAL HIGH (ref 3.87–5.11)
RDW: 20.9 % — ABNORMAL HIGH (ref 11.5–15.5)
WBC: 16 10*3/uL — ABNORMAL HIGH (ref 4.0–10.5)
nRBC: 0 % (ref 0.0–0.2)

## 2023-03-30 LAB — BRAIN NATRIURETIC PEPTIDE: B Natriuretic Peptide: 141.6 pg/mL — ABNORMAL HIGH (ref 0.0–100.0)

## 2023-03-30 LAB — TROPONIN I (HIGH SENSITIVITY)
Troponin I (High Sensitivity): 20 ng/L — ABNORMAL HIGH (ref <18)
Troponin I (High Sensitivity): 21 ng/L — ABNORMAL HIGH (ref <18)

## 2023-03-30 LAB — HCG, SERUM, QUALITATIVE: Preg, Serum: NEGATIVE

## 2023-03-30 NOTE — ED Provider Triage Note (Signed)
 Emergency Medicine Provider Triage Evaluation Note  Christine Singleton , a 33 y.o. female  was evaluated in triage.  Pt complains of shortness of breath. States that same has been ongoing x 1 week. Hx CHF, on Lasix . She has been complaint with her Lasix . Her weight has not increased. Denies chest pain. States she wakes up at night short of breath. Endorses cough as well. No fevers or chills  Review of Systems  Positive:  Negative:   Physical Exam  There were no vitals taken for this visit. Gen:   Awake, no distress dyspnea noted walking to room  Resp:  Normal effort  MSK:   Moves extremities without difficulty  Other:  No leg swelling  Medical Decision Making  Medically screening exam initiated at 9:06 PM.  Appropriate orders placed.  Shakeera Arechiga was informed that the remainder of the evaluation will be completed by another provider, this initial triage assessment does not replace that evaluation, and the importance of remaining in the ED until their evaluation is complete.     Nealy Hickmon A, PA-C 03/30/23 2109

## 2023-03-30 NOTE — ED Triage Notes (Signed)
 PT reports week long feeling of worsening SOB, lethargy, fatigue. Pt is SOB is worse with ambulation or exertion. But also appears SOB at rest.

## 2023-03-30 NOTE — ED Notes (Signed)
 Called and no answer.

## 2023-03-31 NOTE — Progress Notes (Deleted)
  Cardiology Office Note:  .   Date:  03/31/2023  ID:  Christine Singleton, DOB 14-Nov-1990, MRN [id number] PCP: Marius Siemens, NP  [facility] Providers Cardiologist:  None { Click to update primary MD,subspecialty MD or APP then REFRESH:1}   History of Present Illness: .   Christine Singleton is a 33 y.o. female w/ hx of obesity BMI 62, who is referred to cardiology for reduced EF. She had a PE during this time in November. She is on eliquis . She has obstructive sleep apnea on BIPAP. She had hx of HTN. She went to the emergency room yesterday with shortness of breath.  Her BNP was 141; it was elevated to 407 months ago.  ROS: ***  Studies Reviewed: .        *** Risk Assessment/Calculations:   {Does this patient have ATRIAL FIBRILLATION?[phone]}         Physical Exam:   VS: ***   LMP 02/25/2023    Wt Readings from Last 3 Encounters:  03/09/23 (!) 347 lb (157.4 kg)  02/10/23 (!) 340 lb (154.2 kg)  01/29/23 (!) 340 lb (154.2 kg)    GEN: Well nourished, well developed in no acute distress NECK: No JVD; No carotid bruits CARDIAC: ***RRR, no murmurs, rubs, gallops RESPIRATORY:  Clear to auscultation without rales, wheezing or rhonchi  ABDOMEN: Soft, non-tender, non-distended EXTREMITIES:  No edema; No deformity   ASSESSMENT AND PLAN: .   Diastolic CHF Mildy reduced EF. EF 45-50 No hx of ischemia.  In the setting of HTN and significant obesity.  HTN - continue current regimen  Hx of PE 01/2023 - continue eliquis  5 mg BID    {Are you ordering a CV Procedure (e.g. stress test, cath, DCCV, TEE, etc)?   Press F2        :[id number]}  Dispo: ***  Signed, Bridgette Campus, MD

## 2023-03-31 NOTE — ED Notes (Signed)
 Pt called for vitals to be reassessed, no response.

## 2023-04-01 ENCOUNTER — Ambulatory Visit (INDEPENDENT_AMBULATORY_CARE_PROVIDER_SITE_OTHER): Payer: Self-pay

## 2023-04-01 ENCOUNTER — Ambulatory Visit: Payer: Medicaid Other | Admitting: Internal Medicine

## 2023-04-01 NOTE — Telephone Encounter (Signed)
   Chief Complaint: Christine Singleton to ED 03/30/23 with chest pain and left. Still has chest tightness and SOB. Has appointment with cardiology tomorrow. Asking to be seen sooner with PCP than the end of the month. No availability. Symptoms: Above Frequency: 1 week Pertinent Negatives: Patient denies  Disposition: [] ED /[] Urgent Care (no appt availability in office) / [] Appointment(In office/virtual)/ []  [facility]/ [] Home Care/ [] Refused Recommended Disposition /[] [place] [facility]/ [x]  Follow-up with PCP Additional Notes: Will call 911 for worsening of symptoms. Reason for Disposition  [1] MILD difficulty breathing (e.g., minimal/no SOB at rest, SOB with walking, pulse <100) AND [2] NEW-onset or WORSE than normal  Answer Assessment - Initial Assessment Questions 1. RESPIRATORY STATUS: "Describe your breathing?" (e.g., wheezing, shortness of breath, unable to speak, severe coughing)      SOB 2. ONSET: "When did this breathing problem begin?"       1 WEEK 3. PATTERN "Does the difficult breathing come and go, or has it been constant since it started?"      Comes and goes 4. SEVERITY: "How bad is your breathing?" (e.g., mild, moderate, severe)    - MILD: No SOB at rest, mild SOB with walking, speaks normally in sentences, can lie down, no retractions, pulse < 100.    - MODERATE: SOB at rest, SOB with minimal exertion and prefers to sit, cannot lie down flat, speaks in phrases, mild retractions, audible wheezing, pulse 100-120.    - SEVERE: Very SOB at rest, speaks in single words, struggling to breathe, sitting hunched forward, retractions, pulse > 120      Moderate 5. RECURRENT SYMPTOM: "Have you had difficulty breathing before?" If Yes, ask: "When was the last time?" and "What happened that time?"      Yes 6. CARDIAC HISTORY: "Do you have any history of heart disease?" (e.g., heart attack, angina, bypass surgery, angioplasty)      CHF 7. LUNG HISTORY: "Do you have any history of lung  disease?"  (e.g., pulmonary embolus, asthma, emphysema)     pe 8. CAUSE: "What do you think is causing the breathing problem?"      Unsure 9. OTHER SYMPTOMS: "Do you have any other symptoms? (e.g., dizziness, runny nose, cough, chest pain, fever)     No 10. O2 SATURATION MONITOR:  "Do you use an oxygen saturation monitor (pulse oximeter) at home?" If Yes, ask: "What is your reading (oxygen level) today?" "What is your usual oxygen saturation reading?" (e.g., 95%)       No 11. PREGNANCY: "Is there any chance you are pregnant?" "When was your last menstrual period?"       No 12. TRAVEL: "Have you traveled out of the country in the last month?" (e.g., travel history, exposures)       No  Protocols used: Breathing Difficulty-A-AH

## 2023-04-02 ENCOUNTER — Ambulatory Visit: Payer: Medicaid Other | Attending: Internal Medicine | Admitting: Internal Medicine

## 2023-04-02 ENCOUNTER — Telehealth (INDEPENDENT_AMBULATORY_CARE_PROVIDER_SITE_OTHER): Payer: Self-pay | Admitting: Primary Care

## 2023-04-02 VITALS — BP 103/58 | HR 118 | Ht 62.0 in | Wt 345.0 lb

## 2023-04-02 DIAGNOSIS — R0602 Shortness of breath: Secondary | ICD-10-CM | POA: Diagnosis not present

## 2023-04-02 DIAGNOSIS — I5022 Chronic systolic (congestive) heart failure: Secondary | ICD-10-CM | POA: Diagnosis not present

## 2023-04-02 NOTE — Telephone Encounter (Signed)
Please let pt know per provider to keep appt with Cardiology since she is still having chest pain and sob because they will be able to do a thorough workup. If her chest pain and sob gets worse she will need to be seen in the ED.

## 2023-04-02 NOTE — Patient Instructions (Signed)
Medication Instructions:  Increase lasix to 40mg  for 5 days *If you need a refill on your cardiac medications before your next appointment, please call your pharmacy*   Lab Work: BMET in 1 week If you have labs (blood work) drawn today and your tests are completely normal, you will receive your results only by: MyChart Message (if you have MyChart) OR A paper copy in the mail If you have any lab test that is abnormal or we need to change your treatment, we will call you to review the results.   Testing/Procedures: Your physician has requested that you have a lexiscan myoview. For further information please visit [email or web address] Please follow instruction sheet, as given.   Non-Cardiac CT Angiography (CTA), is a special type of CT scan that uses a computer to produce multi-dimensional views of major blood vessels throughout the body. In CT angiography, a contrast material is injected through an IV to help visualize the blood vessels   Follow-Up: At [facility], you and your health needs are our priority.  As part of our continuing mission to provide you with exceptional heart care, we have created designated Provider Care Teams.  These Care Teams include your primary Cardiologist (physician) and Advanced Practice Providers (APPs -  Physician Assistants and Nurse Practitioners) who all work together to provide you with the care you need, when you need it.  Your next appointment:   1 week(s) with an APP   Provider:   Any APP or NP  Other Instructions

## 2023-04-02 NOTE — Progress Notes (Signed)
  Cardiology Office Note:  .   Date:  04/02/2023  ID:  Christine Singleton, DOB October 27, 1990, MRN [id number] PCP: Grayce Sessions, NP  [facility] Providers Cardiologist:  None    History of Present Illness: .   Christine Singleton is a 33 y.o. female w/ hx of obesity BMI 62, who is referred to cardiology for reduced EF estimated to be 45 to 50%.  She had normal RV function.  No pulmonary hypertension.  She was diagnosed with a PE during this time in November 2024. She is on eliquis. She has obstructive sleep apnea on BIPAP. She had hx of HTN. She went to the emergency room yesterday with shortness of breath.  She comes in today for follow-up of mildly reduced EF.  Her EF is closer to 50%.  Think 45% is an underestimation.  She does have hypokinesis of the inferior/inferior septal wall.  This was identified in May and she has not had an ischemic evaluation.  She went to the emergency room on 03/30/2023.  She was reporting shortness of breath which is worse with exertion.  She was noted to be on chronic Lasix. Her BNP was 141; it was elevated to 407 months ago.  Today she comes in for follow-up her prior EKG showed sinus tachycardia.  No family hx  ROS:  per HPI otherwise negative   Studies Reviewed: .         Risk Assessment/Calculations:        Physical Exam:   VS: Vitals:   04/02/23 1428  BP: (!) 103/58  Pulse: (!) 118  SpO2: 94%      LMP 02/25/2023    Wt Readings from Last 3 Encounters:  03/09/23 (!) 347 lb (157.4 kg)  02/10/23 (!) 340 lb (154.2 kg)  01/29/23 (!) 340 lb (154.2 kg)    GEN: Well nourished, well developed in no acute distress NECK: No JVD;  CARDIAC: tachycardic,, no murmurs, rubs, gallops RESPIRATORY:  Clear to auscultation without rales, wheezing or rhonchi  ABDOMEN: Soft, non-tender, non-distended EXTREMITIES:  No edema; No deformity   ASSESSMENT AND PLAN: .   Mildy reduced EF. EF ~50; mid to distal inferior/inferior septal hypokinesis -->Will get a  SPECT to determine if this is in the setting of prior ischemia.  Diastolic CHF SOB Ddx recurrent PE vs. Decompensated CHF.  She possibly has recurrence of her PE.  She has sinus tachycardia which could be related to PE versus obesity.  Her BNP was mildly elevated however can be underestimated with weight.  This can also be contributing. Otherwise volume status and clinical assessment is challenging with her weight.  -->CT PE -->will increase lasix to 40 mg daily for 5 days will get BMET in 1 week   HTN - continue current regimen  Hx of ?provoked PE 01/2023 - she is s/p eliquis 5 mg BID    Informed Consent   Shared Decision Making/Informed Consent The risks [chest pain, shortness of breath, cardiac arrhythmias, dizziness, blood pressure fluctuations, myocardial infarction, stroke/transient ischemic attack, nausea, vomiting, allergic reaction, radiation exposure, metallic taste sensation and life-threatening complications (estimated to be 1 in 10,000)], benefits (risk stratification, diagnosing coronary artery disease, treatment guidance) and alternatives of a nuclear stress test were discussed in detail with Christine Singleton and she agrees to proceed.  Obesity BMI 63 -tried ozempic. Reconsidering another GLP1     Dispo: Follow up with an APP in 1 week  Signed, Gelsey Amyx, Alben Spittle, MD

## 2023-04-02 NOTE — Telephone Encounter (Signed)
wILL DO.

## 2023-04-02 NOTE — Telephone Encounter (Signed)
Called pt and explained to her that she would need to follow up with Cardiology. They would be able to do a thorough workup on her.  Told pt that if she continued to have issues then she needed to go to the ED.

## 2023-04-07 ENCOUNTER — Encounter (HOSPITAL_COMMUNITY): Payer: Self-pay

## 2023-04-07 NOTE — Progress Notes (Signed)
Cardiology Office Note:  .   Date:  04/10/2023  ID:  Christine Singleton, DOB 1990-08-07, MRN [id number] PCP: Grayce Sessions, NP  [facility] Providers Cardiologist:  Carolan Clines, MD   History of Present Illness: .   Christine Singleton is a 33 y.o. female with history of reduced ejection fraction of 45 to 50% with normal RV function, history of PE in 2024 on Eliquis, OSA on BiPAP, hypertension.    Last seen by Dr. Carolan Clines on 04/02/2023 after being seen in the emergency room on 03/30/2023 short of breath.  She was treated and released.  The patient was scheduled for SPECT Myoview to determine if the setting of prior ischemia causing reduced EF, she felt that she may have a possible recurrence of her PE.  Because she was tachycardic.  She was sent to the hospital for CAT scan to rule out PE and Lasix was increased to 40 mg daily for 5 days.  CT scan was ordered for 04/11/2023 and has not been completed yet.  SPECT Myoview is still pending.  She is here for follow-up to evaluate response to Lasix and to have a repeat BMET.  She comes today continuing to be chronically short of breath, she admits to smoking marijuana several times a day every day.  She also states that because of her breathing status she is trying to back off and inhaled THC and is now using edibles.  She states she uses marijuana because she has significant anxiety.  The patient has not been wearing BiPAP but she states she never got it.  She is due to follow-up with PCP on April 13, 2023.  She is due to have her stress Myoview and CT scan within the next week.  She basically is bed ridden.  She states she stays in the bed a good bit of the day is not very active.  She is tolerating the Lasix as directed.  Does not really feel much different with the diuretic.  ROS: As above otherwise negative  Studies Reviewed: Marland Kitchen          Physical Exam:   VS:  BP 112/80 (BP Location: Left Arm, Patient Position: Sitting, Cuff Size:  Large)   Pulse 100   Ht 5\' 2"  (1.575 m)   Wt (!) 352 lb 3.2 oz (159.8 kg)   LMP 02/25/2023   SpO2 94%   BMI 64.42 kg/m    Wt Readings from Last 3 Encounters:  04/10/23 (!) 352 lb 3.2 oz (159.8 kg)  04/02/23 (!) 345 lb (156.5 kg)  03/09/23 (!) 347 lb (157.4 kg)    GEN: Well nourished, well developed in no acute distress NECK: No JVD; No carotid bruits CARDIAC: RRR, no murmurs, rubs, gallops RESPIRATORY:  Clear to auscultation without rales, wheezing or rhonchi diminished in the bases, some wheezing in the upper airway section. ABDOMEN: Soft, non-tender, non-distended EXTREMITIES:  No edema; No deformity   ASSESSMENT AND PLAN: .    Tachycardia: Multifactorial in the setting of significant anxiety, use of marijuana, obesity, and OSA.  She is scheduled to have chest CT and stress Myoview within the next week.  I have advised her to avoid inhaled marijuana as this could be very caustic to her lungs tissue and cause worsening shortness of breath.  She states she is trying to cut back on inhaled marijuana but will continue to use edibles.  She is to use inhalers as directed to help with breathing.  I think some of  her breathing is related to the tachycardia as well.  I will not add beta-blocker in the setting of probable interstitial lung disease.  2.  OSA: The patient should be on BiPAP but has not received her machine.   she is never used it.  She is to follow-up with primary care on April 13, 2023 to have this taken care of and ordered.  She continues to have daytime fatigue, dyspnea on exertion, and is pretty sedentary at home due to her breathing status.    3.  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  CT is being completed to rule out PE, stress Myoview is also ordered for ischemic evaluation.  She denies chest pain or edema.  Morbid obesity makes it difficult for overall assessment.  Remains on Lasix 20 mg daily.   Signed, Bettey Mare. Liborio Nixon, ANP, AACC

## 2023-04-10 ENCOUNTER — Ambulatory Visit: Payer: Medicaid Other | Attending: Adult Health | Admitting: Adult Health

## 2023-04-10 ENCOUNTER — Encounter: Payer: Self-pay | Admitting: Adult Health

## 2023-04-10 VITALS — BP 112/80 | HR 100 | Ht 62.0 in | Wt 352.2 lb

## 2023-04-10 DIAGNOSIS — I5042 Chronic combined systolic (congestive) and diastolic (congestive) heart failure: Secondary | ICD-10-CM

## 2023-04-10 DIAGNOSIS — G4733 Obstructive sleep apnea (adult) (pediatric): Secondary | ICD-10-CM

## 2023-04-10 DIAGNOSIS — R000 Tachycardia, unspecified: Secondary | ICD-10-CM

## 2023-04-10 DIAGNOSIS — J9611 Chronic respiratory failure with hypoxia: Secondary | ICD-10-CM

## 2023-04-10 DIAGNOSIS — Z6841 Body Mass Index (BMI) 40.0 and over, adult: Secondary | ICD-10-CM

## 2023-04-10 NOTE — Patient Instructions (Signed)
Medication Instructions:  No Changes *If you need a refill on your cardiac medications before your next appointment, please call your pharmacy*   Lab Work: No labs If you have labs (blood work) drawn today and your tests are completely normal, you will receive your results only by: MyChart Message (if you have MyChart) OR A paper copy in the mail If you have any lab test that is abnormal or we need to change your treatment, we will call you to review the results.   Testing/Procedures: No Testing   Follow-Up: At [facility], you and your health needs are our priority.  As part of our continuing mission to provide you with exceptional heart care, we have created designated Provider Care Teams.  These Care Teams include your primary Cardiologist (physician) and Advanced Practice Providers (APPs -  Physician Assistants and Nurse Practitioners) who all work together to provide you with the care you need, when you need it.  We recommend signing up for the patient portal called "MyChart".  Sign up information is provided on this After Visit Summary.  MyChart is used to connect with patients for Virtual Visits (Telemedicine).  Patients are able to view lab/test results, encounter notes, upcoming appointments, etc.  Non-urgent messages can be sent to your provider as well.   To learn more about what you can do with MyChart, go to [email or web address].    Your next appointment:   1 month(s)  Provider:   Carolan Clines, MD

## 2023-04-11 ENCOUNTER — Ambulatory Visit (HOSPITAL_COMMUNITY): Payer: Medicaid Other

## 2023-04-13 ENCOUNTER — Emergency Department (HOSPITAL_COMMUNITY): Payer: Medicaid Other

## 2023-04-13 ENCOUNTER — Other Ambulatory Visit: Payer: Self-pay

## 2023-04-13 ENCOUNTER — Ambulatory Visit (INDEPENDENT_AMBULATORY_CARE_PROVIDER_SITE_OTHER): Payer: Medicaid Other | Admitting: Primary Care

## 2023-04-13 ENCOUNTER — Inpatient Hospital Stay (HOSPITAL_COMMUNITY)
Admission: EM | Admit: 2023-04-13 | Discharge: 2023-04-17 | DRG: 291 | Disposition: A | Discharge status: Home / Self Care | Payer: Medicaid Other | Attending: Internal Medicine | Admitting: Internal Medicine

## 2023-04-13 ENCOUNTER — Encounter (INDEPENDENT_AMBULATORY_CARE_PROVIDER_SITE_OTHER): Payer: Self-pay | Admitting: Primary Care

## 2023-04-13 VITALS — BP 129/90 | HR 124 | Resp 16 | Ht 62.0 in | Wt 356.0 lb

## 2023-04-13 DIAGNOSIS — T45516A Underdosing of anticoagulants, initial encounter: Secondary | ICD-10-CM | POA: Diagnosis present

## 2023-04-13 DIAGNOSIS — I509 Heart failure, unspecified: Secondary | ICD-10-CM

## 2023-04-13 DIAGNOSIS — Z9981 Dependence on supplemental oxygen: Secondary | ICD-10-CM | POA: Diagnosis not present

## 2023-04-13 DIAGNOSIS — E119 Type 2 diabetes mellitus without complications: Secondary | ICD-10-CM | POA: Diagnosis present

## 2023-04-13 DIAGNOSIS — I2489 Other forms of acute ischemic heart disease: Secondary | ICD-10-CM | POA: Diagnosis present

## 2023-04-13 DIAGNOSIS — Z6841 Body Mass Index (BMI) 40.0 and over, adult: Secondary | ICD-10-CM

## 2023-04-13 DIAGNOSIS — R079 Chest pain, unspecified: Secondary | ICD-10-CM | POA: Diagnosis not present

## 2023-04-13 DIAGNOSIS — R0902 Hypoxemia: Secondary | ICD-10-CM | POA: Diagnosis present

## 2023-04-13 DIAGNOSIS — I110 Hypertensive heart disease with heart failure: Principal | ICD-10-CM | POA: Diagnosis present

## 2023-04-13 DIAGNOSIS — Z7901 Long term (current) use of anticoagulants: Secondary | ICD-10-CM

## 2023-04-13 DIAGNOSIS — Z7985 Long-term (current) use of injectable non-insulin antidiabetic drugs: Secondary | ICD-10-CM

## 2023-04-13 DIAGNOSIS — E662 Morbid (severe) obesity with alveolar hypoventilation: Secondary | ICD-10-CM | POA: Diagnosis present

## 2023-04-13 DIAGNOSIS — Z7984 Long term (current) use of oral hypoglycemic drugs: Secondary | ICD-10-CM | POA: Diagnosis not present

## 2023-04-13 DIAGNOSIS — E860 Dehydration: Secondary | ICD-10-CM

## 2023-04-13 DIAGNOSIS — Z5982 Transportation insecurity: Secondary | ICD-10-CM

## 2023-04-13 DIAGNOSIS — R0689 Other abnormalities of breathing: Secondary | ICD-10-CM | POA: Diagnosis present

## 2023-04-13 DIAGNOSIS — Z1152 Encounter for screening for COVID-19: Secondary | ICD-10-CM | POA: Diagnosis not present

## 2023-04-13 DIAGNOSIS — Z86711 Personal history of pulmonary embolism: Secondary | ICD-10-CM | POA: Diagnosis not present

## 2023-04-13 DIAGNOSIS — Z79899 Other long term (current) drug therapy: Secondary | ICD-10-CM

## 2023-04-13 DIAGNOSIS — Z8249 Family history of ischemic heart disease and other diseases of the circulatory system: Secondary | ICD-10-CM

## 2023-04-13 DIAGNOSIS — J9601 Acute respiratory failure with hypoxia: Secondary | ICD-10-CM | POA: Diagnosis present

## 2023-04-13 DIAGNOSIS — E66813 Obesity, class 3: Secondary | ICD-10-CM

## 2023-04-13 DIAGNOSIS — D72829 Elevated white blood cell count, unspecified: Secondary | ICD-10-CM | POA: Diagnosis present

## 2023-04-13 DIAGNOSIS — G4733 Obstructive sleep apnea (adult) (pediatric): Secondary | ICD-10-CM

## 2023-04-13 DIAGNOSIS — I5023 Acute on chronic systolic (congestive) heart failure: Secondary | ICD-10-CM | POA: Diagnosis present

## 2023-04-13 DIAGNOSIS — Z713 Dietary counseling and surveillance: Secondary | ICD-10-CM

## 2023-04-13 DIAGNOSIS — Z91018 Allergy to other foods: Secondary | ICD-10-CM | POA: Diagnosis not present

## 2023-04-13 DIAGNOSIS — Z5986 Financial insecurity: Secondary | ICD-10-CM | POA: Diagnosis not present

## 2023-04-13 DIAGNOSIS — I5043 Acute on chronic combined systolic (congestive) and diastolic (congestive) heart failure: Secondary | ICD-10-CM | POA: Diagnosis not present

## 2023-04-13 LAB — CBC WITH DIFFERENTIAL/PLATELET
Abs Immature Granulocytes: 0.07 10*3/uL (ref 0.00–0.07)
Basophils Absolute: 0.1 10*3/uL (ref 0.0–0.1)
Basophils Relative: 1 %
Eosinophils Absolute: 0.2 10*3/uL (ref 0.0–0.5)
Eosinophils Relative: 1 %
HCT: 45.5 % (ref 36.0–46.0)
Hemoglobin: 14.2 g/dL (ref 12.0–15.0)
Immature Granulocytes: 1 %
Lymphocytes Relative: 25 %
Lymphs Abs: 3.6 10*3/uL (ref 0.7–4.0)
MCH: 23 pg — ABNORMAL LOW (ref 26.0–34.0)
MCHC: 31.2 g/dL (ref 30.0–36.0)
MCV: 73.6 fL — ABNORMAL LOW (ref 80.0–100.0)
Monocytes Absolute: 0.7 10*3/uL (ref 0.1–1.0)
Monocytes Relative: 5 %
Neutro Abs: 10 10*3/uL — ABNORMAL HIGH (ref 1.7–7.7)
Neutrophils Relative %: 67 %
Platelets: 398 10*3/uL (ref 150–400)
RBC: 6.18 MIL/uL — ABNORMAL HIGH (ref 3.87–5.11)
RDW: 20.4 % — ABNORMAL HIGH (ref 11.5–15.5)
WBC: 14.6 10*3/uL — ABNORMAL HIGH (ref 4.0–10.5)
nRBC: 0.1 % (ref 0.0–0.2)

## 2023-04-13 LAB — COMPREHENSIVE METABOLIC PANEL
ALT: 18 U/L (ref 0–44)
AST: 28 U/L (ref 15–41)
Albumin: 3.3 g/dL — ABNORMAL LOW (ref 3.5–5.0)
Alkaline Phosphatase: 106 U/L (ref 38–126)
Anion gap: 10 (ref 5–15)
BUN: 7 mg/dL (ref 6–20)
CO2: 29 mmol/L (ref 22–32)
Calcium: 9 mg/dL (ref 8.9–10.3)
Chloride: 98 mmol/L (ref 98–111)
Creatinine, Ser: 0.73 mg/dL (ref 0.44–1.00)
GFR, Estimated: >60 mL/min (ref >60)
Glucose, Bld: 85 mg/dL (ref 70–99)
Potassium: 5.7 mmol/L — ABNORMAL HIGH (ref 3.5–5.1)
Sodium: 137 mmol/L (ref 135–145)
Total Bilirubin: 1.4 mg/dL — ABNORMAL HIGH (ref 0.0–1.2)
Total Protein: 7.1 g/dL (ref 6.5–8.1)

## 2023-04-13 LAB — TROPONIN I (HIGH SENSITIVITY)
Troponin I (High Sensitivity): 23 ng/L — ABNORMAL HIGH (ref <18)
Troponin I (High Sensitivity): 20 ng/L — ABNORMAL HIGH (ref <18)

## 2023-04-13 LAB — RESP PANEL BY RT-PCR (RSV, FLU A&B, COVID)  RVPGX2
Influenza A by PCR: NEGATIVE
Influenza B by PCR: NEGATIVE
Resp Syncytial Virus by PCR: NEGATIVE
SARS Coronavirus 2 by RT PCR: NEGATIVE

## 2023-04-13 LAB — BRAIN NATRIURETIC PEPTIDE: B Natriuretic Peptide: 226.2 pg/mL — ABNORMAL HIGH (ref 0.0–100.0)

## 2023-04-13 LAB — D-DIMER, QUANTITATIVE: D-Dimer, Quant: 0.92 ug{FEU}/mL — ABNORMAL HIGH (ref 0.00–0.50)

## 2023-04-13 LAB — HCG, SERUM, QUALITATIVE: Preg, Serum: NEGATIVE

## 2023-04-13 MED ORDER — ACETAMINOPHEN 325 MG PO TABS
650.0000 mg | ORAL_TABLET | Freq: Four times a day (QID) | ORAL | Status: DC | PRN
Start: 2023-04-13 — End: 2023-04-17
  Administered 2023-04-14 (×2): 650 mg via ORAL
  Filled 2023-04-13 (×2): qty 2

## 2023-04-13 MED ORDER — ACETAMINOPHEN 650 MG RE SUPP: 650.0000 mg | Freq: Four times a day (QID) | RECTAL | Status: DC | PRN

## 2023-04-13 MED ORDER — SPIRONOLACTONE 25 MG PO TABS
25.0000 mg | ORAL_TABLET | Freq: Every day | ORAL | Status: DC
  Administered 2023-04-14 – 2023-04-17 (×4): 25 mg via ORAL
  Filled 2023-04-13: qty 2
  Filled 2023-04-13 (×3): qty 1

## 2023-04-13 MED ORDER — FUROSEMIDE 10 MG/ML IJ SOLN
40.0000 mg | Freq: Once | INTRAMUSCULAR | Status: AC
  Administered 2023-04-13: 40 mg via INTRAVENOUS
  Filled 2023-04-13: qty 4

## 2023-04-13 MED ORDER — ONDANSETRON HCL 4 MG/2ML IJ SOLN: 4.0000 mg | Freq: Four times a day (QID) | INTRAMUSCULAR | Status: DC | PRN

## 2023-04-13 MED ORDER — IOHEXOL 350 MG/ML SOLN
75.0000 mL | Freq: Once | INTRAVENOUS | Status: AC | PRN
  Administered 2023-04-13: 75 mL via INTRAVENOUS

## 2023-04-13 MED ORDER — ONDANSETRON HCL 4 MG PO TABS: 4.0000 mg | ORAL_TABLET | Freq: Four times a day (QID) | ORAL | Status: DC | PRN

## 2023-04-13 MED ORDER — FUROSEMIDE 20 MG PO TABS: 40.0000 mg | ORAL_TABLET | Freq: Every day | ORAL | Status: DC

## 2023-04-13 MED ORDER — OXYCODONE HCL 5 MG PO TABS: 5.0000 mg | ORAL_TABLET | ORAL | Status: DC | PRN

## 2023-04-13 NOTE — ED Notes (Signed)
Placed pt on a new O2 tank at 3L/min

## 2023-04-13 NOTE — ED Notes (Signed)
Patient had one large episode of urinary incontinence after IV lasix. Linens changed and patient cleaned up by [facility], NT

## 2023-04-13 NOTE — ED Provider Notes (Signed)
[facility] AT [facility] Provider Note   CSN: [id number] Arrival date & time: 04/13/23  1519     History  Chief Complaint  Patient presents with   Shortness of Breath    Christine Singleton is a 33 y.o. female past medical history significant for morbid obesity, CHF, diabetes, previous pulmonary embolus who is not currently on anticoagulation who presents with concern for shortness of breath for 8 days, weight gain, and hypoxia.  Patient seen by Dr. Earlier today, hypoxic to around 70, no oxygen use at baseline.  She reports some intermittent chest pain.  She denies any unilateral leg swelling, recent travel.  She does think that she may have stopped taking her blood thinner early, because she was confused about when she was supposed to stop taking it.  She reports she has not taken it in 1-1/2 months.  She reports this does feel somewhat similar to when she had a previous blood clot.  She denies any nausea, vomiting.  Chest pain not worse with exertion.   Shortness of Breath      Home Medications Prior to Admission medications   Medication Sig Start Date End Date Taking? Authorizing Provider  acetaminophen (TYLENOL) 325 MG tablet Take 2 tablets (650 mg total) by mouth every 6 (six) hours as needed for mild pain or moderate pain (or Fever >/= 101). 08/19/22  Yes Arrien, York Ram, MD  Dulaglutide (TRULICITY) 0.75 MG/0.5ML SOAJ Inject 0.75 mg into the skin once a week. Patient taking differently: Inject 0.75 mg into the skin once a week. Monday 02/06/23  Yes Hoy Register, MD  empagliflozin (JARDIANCE) 10 MG TABS tablet Take 1 tablet (10 mg total) by mouth daily. 09/05/22  Yes Andrey Farmer, PA-C  furosemide (LASIX) 20 MG tablet Take 1 tablet (20 mg total) by mouth daily. Patient taking differently: Take 40 mg by mouth daily. 09/05/22  Yes Andrey Farmer, PA-C  guaiFENesin (MUCINEX) 600 MG 12 hr tablet Take 600 mg by mouth 2 (two) times  daily as needed for cough or to loosen phlegm.   Yes [provider]  metFORMIN (GLUCOPHAGE) 500 MG tablet Take 1 tablet (500 mg total) by mouth daily with breakfast. 10/29/22  Yes Grayce Sessions, NP  OXYGEN Inhale 2 application  into the lungs at bedtime.   Yes [provider]  spironolactone (ALDACTONE) 25 MG tablet Take 1 tablet (25 mg total) by mouth daily. 09/05/22 05/11/23 Yes Andrey Farmer, PA-C  Accu-Chek Softclix Lancets lancets Use to check blood sugar once daily. 11/04/22   Hoy Register, MD  APIXABAN Everlene Balls) VTE STARTER PACK (10MG  AND 5MG ) Take as directed on package: start with two-5mg  tablets twice daily for 7 days. On day 8, switch to one-5mg  tablet twice daily. Patient not taking: Reported on 04/13/2023 01/30/23   Lorin Glass, MD  Blood Glucose Monitoring Suppl (ACCU-CHEK GUIDE) w/Device KIT Use to check blood sugar once daily. 11/04/22   Hoy Register, MD  Blood Pressure KIT Please check blood pressure every morning and keep a log. 08/19/22   Arrien, York Ram, MD  glucose blood (ACCU-CHEK GUIDE) test strip Use to check blood sugar once daily. 11/04/22   Hoy Register, MD      Allergies    Strawberry (diagnostic)    Review of Systems   Review of Systems  Respiratory:  Positive for shortness of breath.   All other systems reviewed and are negative.   Physical Exam Updated Vital Signs BP Marland Kitchen)  118/106 (BP Location: Left Arm)   Pulse (!) 101   Temp 98.2 F (36.8 C) (Oral)   Resp 13   LMP 02/25/2023   SpO2 99%  Physical Exam Vitals and nursing note reviewed.  Constitutional:      General: She is not in acute distress.    Appearance: Normal appearance.  HENT:     Head: Normocephalic and atraumatic.  Eyes:     General:        Right eye: No discharge.        Left eye: No discharge.  Cardiovascular:     Rate and Rhythm: Regular rhythm. Tachycardia present.     Heart sounds: No murmur heard.    No friction rub. No gallop.   Pulmonary:     Effort: Pulmonary effort is normal.     Breath sounds: Normal breath sounds.     Comments: Diminished bilaterally, no wheezing, rhonchi, stridor, rales, no focal consolidation. Abdominal:     General: Bowel sounds are normal.     Palpations: Abdomen is soft.  Musculoskeletal:     Comments: No significant bilateral lower extremity edema.  No unilateral leg swelling.  Skin:    General: Skin is warm and dry.     Capillary Refill: Capillary refill takes less than 2 seconds.  Neurological:     Mental Status: She is alert and oriented to person, place, and time.  Psychiatric:        Mood and Affect: Mood normal.        Behavior: Behavior normal.     ED Results / Procedures / Treatments   Labs (all labs ordered are listed, but only abnormal results are displayed) Labs Reviewed  BRAIN NATRIURETIC PEPTIDE - Abnormal; Notable for the following components:      Result Value   B Natriuretic Peptide 226.2 (*)    All other components within normal limits  COMPREHENSIVE METABOLIC PANEL - Abnormal; Notable for the following components:   Potassium 5.7 (*)    Albumin 3.3 (*)    Total Bilirubin 1.4 (*)    All other components within normal limits  CBC WITH DIFFERENTIAL/PLATELET - Abnormal; Notable for the following components:   WBC 14.6 (*)    RBC 6.18 (*)    MCV 73.6 (*)    MCH 23.0 (*)    RDW 20.4 (*)    Neutro Abs 10.0 (*)    All other components within normal limits  D-DIMER, QUANTITATIVE - Abnormal; Notable for the following components:   D-Dimer, Quant 0.92 (*)    All other components within normal limits  TROPONIN I (HIGH SENSITIVITY) - Abnormal; Notable for the following components:   Troponin I (High Sensitivity) 23 (*)    All other components within normal limits  TROPONIN I (HIGH SENSITIVITY) - Abnormal; Notable for the following components:   Troponin I (High Sensitivity) 20 (*)    All other components within normal limits  RESP PANEL BY RT-PCR (RSV, FLU  A&B, COVID)  RVPGX2  HCG, SERUM, QUALITATIVE    EKG EKG Interpretation Date/Time:  Monday April 13 2023 16:35:29 EST Ventricular Rate:  101 PR Interval:  134 QRS Duration:  68 QT Interval:  360 QTC Calculation: 466 R Axis:   129  Text Interpretation: Suspect arm lead reversal, interpretation assumes no reversal AGE AND GENDER SPECIFIC ECG ANALYSIS Unusual P axis, possible ectopic atrial tachycardia Right axis deviation Right ventricular hypertrophy ST elevation consider inferior injury or acute infarct ACUTE MI / STEMI Consider right  ventricular involvement in acute inferior infarct Abnormal ECG When compared with ECG of 10-Apr-2023 11:15, PREVIOUS ECG IS PRESENT Confirmed by Glyn Ade [phone]) on 04/13/2023 4:40:23 PM  Radiology CT Angio Chest PE W and/or Wo Contrast Result Date: 04/13/2023 CLINICAL DATA:  Shortness of breath.  CHF.  PE suspected. EXAM: CT ANGIOGRAPHY CHEST WITH CONTRAST TECHNIQUE: Multidetector CT imaging of the chest was performed using the standard protocol during bolus administration of intravenous contrast. Multiplanar CT image reconstructions and MIPs were obtained to evaluate the vascular anatomy. RADIATION DOSE REDUCTION: This exam was performed according to the departmental dose-optimization program which includes automated exposure control, adjustment of the mA and/or kV according to patient size and/or use of iterative reconstruction technique. CONTRAST:  75mL OMNIPAQUE IOHEXOL 350 MG/ML SOLN COMPARISON:  Same-day radiograph and CTA chest 01/26/2023 FINDINGS: Cardiovascular: Negative for acute pulmonary embolism. The main pulmonary artery is dilated measuring 37 mm in maximum diameter. Cardiomegaly. No pericardial effusion. Normal caliber thoracic aorta. Mediastinum/Nodes: No thoracic adenopathy by size. Trachea and esophagus are unremarkable. Lungs/Pleura: Bilateral patchy ground-glass opacities. No pleural effusion or pneumothorax. Upper Abdomen: No acute  abnormality. Musculoskeletal: No acute fracture. Review of the MIP images confirms the above findings. IMPRESSION: 1. Negative for acute pulmonary embolism. 2. Bilateral patchy ground-glass opacities, favor pulmonary edema. 3. Dilated main pulmonary artery, which can be seen in the setting of pulmonary hypertension. Electronically Signed   By: Minerva Fester M.D.   On: 04/13/2023 21:10   DG Chest 2 View Result Date: 04/13/2023 CLINICAL DATA:  Shortness of breath.  CHF. EXAM: CHEST - 2 VIEW COMPARISON:  Radiograph 03/30/2023.  CT 01/26/2023 FINDINGS: Cardiomegaly is stable. Unchanged mediastinal contours. Vascular congestion without frank pulmonary edema. Chronic elevation of right hemidiaphragm. No significant pleural effusion. No focal airspace disease. No pneumothorax. Stable osseous structures. IMPRESSION: Stable cardiomegaly with vascular congestion. Electronically Signed   By: Narda Rutherford M.D.   On: 04/13/2023 16:58    Procedures Procedures   Medications Ordered in ED Medications  furosemide (LASIX) injection 40 mg (has no administration in time range)  iohexol (OMNIPAQUE) 350 MG/ML injection 75 mL (75 mLs Intravenous Contrast Given 04/13/23 2044)    ED Course/ Medical Decision Making/ A&P Clinical Course as of 04/13/23 2204  Mon Apr 13, 2023  2022 Hx of CHF, is on eliquis for hx of PE, hasn't been taking it since then, stopped about a month and a half ago.  [CP]    Clinical Course User Index [CP] Olene Floss, PA-C                                 Medical Decision Making Amount and/or Complexity of Data Reviewed Radiology: ordered.  Risk Prescription drug management.   This patient is a 33 y.o. female who presents to the ED for concern of shob, this involves an extensive number of treatment options, and is a complaint that carries with it a high risk of complications and morbidity. The emergent differential diagnosis prior to evaluation includes, but is not limited  to,  asthma exacerbation, COPD exacerbation, acute upper respiratory infection, acute bronchitis, chronic bronchitis, interstitial lung disease, ARDS, PE, pneumonia, atypical ACS, carbon monoxide poisoning, spontaneous pneumothorax, new CHF vs CHF exacerbation, versus other . This is not an exhaustive differential.   Past Medical History / Co-morbidities / Social History: morbid obesity, CHF, diabetes, previous pulmonary embolus who is not currently on anticoagulation  Additional history: Chart  reviewed. Pertinent results include: Reviewed lab work, imaging from previous emergency department visits, outpatient family medicine, cardiology visits.  Physical Exam: Physical exam performed. The pertinent findings include: Diminished bilaterally, no wheezing, rhonchi, stridor, rales, no focal consolidation.  No significant bilateral lower extremity edema.  No unilateral leg swelling.   Some tachycardia on arrival with normal rhythm.  Diastolic hypertension, blood pressure 118/106.  Afebrile, stable oxygen saturation on 3 L nasal cannula but hypoxic on room air.  Lab Tests: I ordered, and personally interpreted labs.  The pertinent results include: CMP notable for hyperkalemia, testing 5.7, mildly elevated total bilirubin at 1.4.  D-dimer elevated at 0.92, COVID, flu negative.  Initial troponin elevated 24 but with delta at 20, no evidence of ACS.  Her BNP is mildly elevated to 26.2 suspicious for heart failure exacerbation especially given her weight gain.  CBC notable for leukocytosis, white blood cells 14.6.  Pregnancy test negative.   Imaging Studies: I ordered imaging studies including CT PE study. I independently visualized and interpreted imaging which showed patient with evidence of pulmonary hypertension, pulmonary vascular congestion, cardiomegaly, no evidence of acute pulmonary embolus. I agree with the radiologist interpretation.   Cardiac Monitoring:  The patient was maintained on a  cardiac monitor.  My attending physician Dr. Doran Durand viewed and interpreted the cardiac monitored which showed an underlying rhythm of: Right ventricular hypertrophy, some ST-T changes without evidence of acute STEMI. I agree with this interpretation.   Medications: I ordered medication including lasix  for heart failure exacerbation.   Consultations Obtained: I requested consultation with the hospitalist,  and discussed lab and imaging findings as well as pertinent plan - they recommend: Admission for acute hypoxic respiratory failure likely secondary to pulmonary hypertension, CHF exacerbation   Disposition: After consideration of the diagnostic results and the patients response to treatment, I feel that patient would benefit from admission for acute hypoxic respiratory failure as described above.   I discussed this case with my attending physician Dr. Doran Durand who cosigned this note including patient's presenting symptoms, physical exam, and planned diagnostics and interventions. Attending physician stated agreement with plan or made changes to plan which were implemented.    Final Clinical Impression(s) / ED Diagnoses Final diagnoses:  Hypoxia  Acute congestive heart failure, unspecified heart failure type [facility])    Rx / DC Orders ED Discharge Orders     None         [place] 04/13/23 2204    Glyn Ade, MD 04/13/23 2249

## 2023-04-13 NOTE — ED Triage Notes (Signed)
Patient with hx of CHF here via EMS for sob x 8 days. Increased dose of Lasix from 20mg /day to 40mg /day.  15lbs of weight gain the last two days 90% SPO2 room air and placed on 3L with improvement to 98%.

## 2023-04-13 NOTE — ED Provider Triage Note (Signed)
Emergency Medicine Provider Triage Evaluation Note  Christine Singleton , a 33 y.o. female  was evaluated in triage.  Pt complains of shortness of breath.  History of PE not currently on anticoagulation, heart failure, type 2 diabetes and obstructive sleep apnea.  Just over a week of increased shortness of breath and dyspnea on exertion.  15 pounds of weight gain over the last 48 hours.  Cardiology increased Lasix from 20 daily to 40 daily.  She has been taking this increased dose for the last 4 days without significant relief.  Noted to be hypoxic earlier.  Also reports some chest tightness.  Feels similar to prior episode of pulmonary embolism.  She has been off of Eliquis for the last month.  At baseline she has home O2 only at night (3 L).  No fevers, productive coughing, chills, nausea, vomiting or other complaint at this time  Review of Systems  Positive: As above Negative: As above  Physical Exam  BP (!) 158/110   Pulse (!) 103   Temp 99 F (37.2 C)   Resp 20   LMP 02/25/2023   SpO2 90%  Gen:   Awake, no distress Resp:  Normal effort on 3 L nasal cannula.  No wheezing rales or rhonchi MSK:   Moves extremities without difficulty Other:  Medical Decision Making  Medically screening exam initiated at 4:21 PM.  Appropriate orders placed.  Christine Singleton was informed that the remainder of the evaluation will be completed by another provider, this initial triage assessment does not replace that evaluation, and the importance of remaining in the ED until their evaluation is complete.  33-year-old female with history as above presenting for shortness of breath and increased weight gain.  Differential diagnosis based upon my triage assessment include acute heart failure exacerbation, pulmonary embolism, ACS, pneumonia, viral respiratory illness among others.  Stable on 3 L nasal cannula at this time.  Will obtain workup in triage to evaluate for these etiologies.  Remainder of care per ED team    Royanne Foots, DO 04/13/23 1624

## 2023-04-13 NOTE — Progress Notes (Unsigned)
[facility]  Christine Singleton, is a 33 y.o. female  OZH:[id number]  GEX:[id number]  DOB - Dec 10, 1990  Chief Complaint  Patient presents with   Shortness of Breath   Chest Pain   Hypertension       Subjective:   Christine Singleton is a 33 y.o. female here today for an acute visit.  HPI She presents with shortness of breath and chest pains. Sending out EMS chest pain shortness of breath stats 82 with exertion 84 room air desat to 77 place on 2 liters went up to 4 liters 94% - eloquios last taken a month ago. Spoke with Pulmonology agreed with sending out and f/u after d/c for bipap . Sent secure chat to cardiologist regarding same.  No problems updated.  Comprehensive ROS Pertinent positive and negative noted in HPI   Allergies  Allergen Reactions   Strawberry (Diagnostic) Anaphylaxis    Past Medical History:  Diagnosis Date   Gestational diabetes    With first pregnancy    No current facility-administered medications on file prior to visit.   Current Outpatient Medications on File Prior to Visit  Medication Sig Dispense Refill   Accu-Chek Softclix Lancets lancets Use to check blood sugar once daily. 100 each 6   acetaminophen (TYLENOL) 325 MG tablet Take 2 tablets (650 mg total) by mouth every 6 (six) hours as needed for mild pain or moderate pain (or Fever >/= 101).     APIXABAN (ELIQUIS) VTE STARTER PACK (10MG  AND 5MG ) Take as directed on package: start with two-5mg  tablets twice daily for 7 days. On day 8, switch to one-5mg  tablet twice daily. (Patient not taking: Reported on 04/13/2023) 74 each 0   Blood Glucose Monitoring Suppl (ACCU-CHEK GUIDE) w/Device KIT Use to check blood sugar once daily. 1 kit 0   Blood Pressure KIT Please check blood pressure every morning and keep a log. 1 kit 0   Dulaglutide (TRULICITY) 0.75 MG/0.5ML SOAJ Inject 0.75 mg into the skin once a week. (Patient taking differently: Inject 0.75 mg into the skin once a week. Monday) 2 mL 1    empagliflozin (JARDIANCE) 10 MG TABS tablet Take 1 tablet (10 mg total) by mouth daily. 30 tablet 3   furosemide (LASIX) 20 MG tablet Take 1 tablet (20 mg total) by mouth daily. (Patient taking differently: Take 40 mg by mouth daily.) 30 tablet 3   glucose blood (ACCU-CHEK GUIDE) test strip Use to check blood sugar once daily. 100 each 6   metFORMIN (GLUCOPHAGE) 500 MG tablet Take 1 tablet (500 mg total) by mouth daily with breakfast. 90 tablet 1   OXYGEN Inhale 2 application  into the lungs at bedtime.     spironolactone (ALDACTONE) 25 MG tablet Take 1 tablet (25 mg total) by mouth daily. 30 tablet 3   Health Maintenance  Topic Date Due   Pneumococcal Vaccination (1 of 2 - PCV) Never done   Complete foot exam   Never done   Eye exam for diabetics  Never done   Hepatitis C Screening  Never done   COVID-19 Vaccine (1 - 2024-25 season) Never done   Pap with HPV screening  02/05/2023   Flu Shot  06/15/2023*   Hemoglobin A1C  07/27/2023   Yearly kidney health urinalysis for diabetes  09/09/2023   Yearly kidney function blood test for diabetes  04/13/2024   DTaP/Tdap/Td vaccine (2 - Td or Tdap) 11/26/2025   HIV Screening  Completed   HPV Vaccine  Aged Out  *  Topic was postponed. The date shown is not the original due date.    Objective:   Vitals:   04/13/23 1349  BP: (!) 129/90  Pulse: (!) 124  Resp: 16  SpO2: (!) 84%  Weight: (!) 356 lb (161.5 kg)  Height: 5\' 2"  (1.575 m)   BP Readings from Last 3 Encounters:  04/14/23 (!) 98/55  04/13/23 (!) 129/90  04/10/23 112/80      Physical Exam Vitals reviewed.  Constitutional:      Appearance: She is obese.     Comments: morbid  HENT:     Head: Normocephalic.     Comments: Dehydrated tongue and lips crack eyes suncken  Cardiovascular:     Rate and Rhythm: Tachycardia present.     Heart sounds: Normal heart sounds.  Pulmonary:     Breath sounds: Normal breath sounds.  Abdominal:     General: Bowel sounds are normal.   Musculoskeletal:     Cervical back: Neck supple.  Skin:    General: Skin is warm and dry.  Neurological:     Mental Status: She is alert and oriented to person, place, and time.  Psychiatric:        Mood and Affect: Mood is anxious.        Behavior: Behavior normal.       Assessment & Plan  Juliani "Nilda Hannula" was seen today for shortness of breath, chest pain and hypertension.  Diagnoses and all orders for this visit:  Class 3 obesity/OSA daytime fatigue, dyspnea on exertion, and is pretty sedentary at home due to her breathing and weight   Hypoxia See HPI  Dehydration On lasix 5 days 40mg  than return to 20mg  - stated urination frequently and felt better   Chest pain, unspecified type Notified Dr. Wyline Mood via secure chat about this visit and sending out EMS    Patient have been counseled extensively about nutrition and exercise. Other issues discussed during this visit include: low cholesterol diet, weight control and daily exercise, foot care, annual eye examinations at Ophthalmology, importance of adherence with medications and regular follow-up. We also discussed long term complications of uncontrolled diabetes and hypertension.   No follow-ups on file.  The patient was given clear instructions to go to ER or return to medical center if symptoms don't improve, worsen or new problems develop. The patient verbalized understanding. The patient was told to call to get lab results if they haven't heard anything in the next week.   This note has been created with Education officer, environmental. Any transcriptional errors are unintentional.   Grayce Sessions, NP 04/14/2023, 8:55 AM

## 2023-04-14 ENCOUNTER — Encounter (HOSPITAL_COMMUNITY): Payer: Self-pay | Admitting: Internal Medicine

## 2023-04-14 DIAGNOSIS — I5023 Acute on chronic systolic (congestive) heart failure: Secondary | ICD-10-CM | POA: Diagnosis not present

## 2023-04-14 DIAGNOSIS — I5043 Acute on chronic combined systolic (congestive) and diastolic (congestive) heart failure: Secondary | ICD-10-CM

## 2023-04-14 LAB — CBC
HCT: 46.1 % — ABNORMAL HIGH (ref 36.0–46.0)
Hemoglobin: 14.5 g/dL (ref 12.0–15.0)
MCH: 23 pg — ABNORMAL LOW (ref 26.0–34.0)
MCHC: 31.5 g/dL (ref 30.0–36.0)
MCV: 73.2 fL — ABNORMAL LOW (ref 80.0–100.0)
Platelets: 433 10*3/uL — ABNORMAL HIGH (ref 150–400)
RBC: 6.3 MIL/uL — ABNORMAL HIGH (ref 3.87–5.11)
RDW: 20.3 % — ABNORMAL HIGH (ref 11.5–15.5)
WBC: 15.7 10*3/uL — ABNORMAL HIGH (ref 4.0–10.5)
nRBC: 0.1 % (ref 0.0–0.2)

## 2023-04-14 LAB — BASIC METABOLIC PANEL
Anion gap: 11 (ref 5–15)
BUN: 7 mg/dL (ref 6–20)
CO2: 33 mmol/L — ABNORMAL HIGH (ref 22–32)
Calcium: 9 mg/dL (ref 8.9–10.3)
Chloride: 92 mmol/L — ABNORMAL LOW (ref 98–111)
Creatinine, Ser: 0.73 mg/dL (ref 0.44–1.00)
GFR, Estimated: >60 mL/min (ref >60)
Glucose, Bld: 120 mg/dL — ABNORMAL HIGH (ref 70–99)
Potassium: 4.3 mmol/L (ref 3.5–5.1)
Sodium: 136 mmol/L (ref 135–145)

## 2023-04-14 LAB — GLUCOSE, CAPILLARY: Glucose-Capillary: 101 mg/dL — ABNORMAL HIGH (ref 70–99)

## 2023-04-14 MED ORDER — FUROSEMIDE 40 MG PO TABS: 40.0000 mg | ORAL_TABLET | Freq: Every day | ORAL | Status: DC

## 2023-04-14 MED ORDER — FUROSEMIDE 10 MG/ML IJ SOLN: 40.0000 mg | Freq: Every day | INTRAMUSCULAR | Status: DC

## 2023-04-14 MED ORDER — FUROSEMIDE 10 MG/ML IJ SOLN
40.0000 mg | Freq: Every day | INTRAMUSCULAR | Status: DC
  Administered 2023-04-15 – 2023-04-17 (×3): 40 mg via INTRAVENOUS
  Filled 2023-04-14 (×3): qty 4

## 2023-04-14 MED ORDER — RIVAROXABAN 20 MG PO TABS
20.0000 mg | ORAL_TABLET | Freq: Every day | ORAL | Status: DC
  Administered 2023-04-14 – 2023-04-17 (×4): 20 mg via ORAL
  Filled 2023-04-14: qty 1
  Filled 2023-04-14: qty 2
  Filled 2023-04-14 (×2): qty 1

## 2023-04-14 MED ORDER — FUROSEMIDE 10 MG/ML IJ SOLN
40.0000 mg | Freq: Once | INTRAMUSCULAR | Status: AC
  Administered 2023-04-14: 40 mg via INTRAVENOUS
  Filled 2023-04-14: qty 4

## 2023-04-14 NOTE — Plan of Care (Signed)
Problem: Elimination: Goal: Will not experience complications related to bowel motility Outcome: Progressing Goal: Will not experience complications related to urinary retention Outcome: Progressing   Problem: Pain Managment: Goal: General experience of comfort will improve and/or be controlled Outcome: Progressing   Problem: Safety: Goal: Ability to remain free from injury will improve Outcome: Progressing

## 2023-04-14 NOTE — H&P (Signed)
History and Physical    Lesta Limbert ZOX:[id number] DOB: 1990-07-20 DOA: 04/13/2023  PCP: Grayce Sessions, NP   Chief Complaint: sob  HPI: Christine Singleton is a 33 y.o. female with medical history significant of morbid obesity, PE on Eliquis, obstructive sleep apnea not on BiPAP, heart failure reduced ejection fraction, hypertension who presents emergency department due to shortness of breath.  Patient was recently seen by cardiology 2 weeks ago at that time she was plan having stress test to determine ischemic etiology of her heart failure.  She was maintained on p.o. Lasix.  She states that over the last 2 days she has had progressively worsening shortness of breath and weight gain and hypoxia.  She went to her doctor who found her to be hypoxic in the 70s and recommended she present to the ER.  On arrival to the emergency department she was afebrile hemodynamically stable.  Labs were obtained which demonstrated BNP 226, potassium 5.7, creatinine 0.73, WBC 14.6, hemoglobin 14.2, respiratory viral panel negative, troponin 23, 20.  Patient underwent CTA pulmonary embolism study which showed no evidence of PE however pulmonary edema and evidence of pulmonary hypertension.  Chest x-ray showed cardiomegaly and vascular congestion.  Patient was admitted due to heart failure exacerbation.  On admission explained to patient the importance of obtaining her CPAP.  She said she has been diagnosed with sleep apnea but she is unable to get her device yet.  She is also been started on tirzepatide however she is not taking it because of fear of what the medication will do.  She is  volume overload and short of breath just with lying flat.   Review of Systems: Review of Systems  Constitutional:  Negative for chills and fever.  HENT: Negative.    Respiratory: Negative.    Cardiovascular:  Positive for orthopnea, leg swelling and PND.  Gastrointestinal: Negative.   Genitourinary: Negative.   Musculoskeletal:  Negative.   Skin: Negative.   Neurological: Negative.   Endo/Heme/Allergies: Negative.   Psychiatric/Behavioral: Negative.       As per HPI otherwise 10 point review of systems negative.   Allergies  Allergen Reactions   Strawberry (Diagnostic) Anaphylaxis    Past Medical History:  Diagnosis Date   Gestational diabetes    With first pregnancy    Past Surgical History:  Procedure Laterality Date   CESAREAN SECTION     CESAREAN SECTION N/A 02/03/2016   Procedure: CESAREAN SECTION;  Surgeon: Lesly Dukes, MD;  Location: [facility] BIRTHING SUITES;  Service: Obstetrics;  Laterality: N/A;     reports that she has never smoked. She has never used smokeless tobacco. She reports that she does not drink alcohol and does not use drugs.  Family History  Problem Relation Age of Onset   Cancer Neg Hx    Diabetes Neg Hx    Hypertension Neg Hx     Prior to Admission medications   Medication Sig Start Date End Date Taking? Authorizing Provider  acetaminophen (TYLENOL) 325 MG tablet Take 2 tablets (650 mg total) by mouth every 6 (six) hours as needed for mild pain or moderate pain (or Fever >/= 101). 08/19/22  Yes Arrien, York Ram, MD  Dulaglutide (TRULICITY) 0.75 MG/0.5ML SOAJ Inject 0.75 mg into the skin once a week. Patient taking differently: Inject 0.75 mg into the skin once a week. Monday 02/06/23  Yes Hoy Register, MD  empagliflozin (JARDIANCE) 10 MG TABS tablet Take 1 tablet (10 mg total) by mouth daily. 09/05/22  Yes Andrey Farmer, PA-C  furosemide (LASIX) 20 MG tablet Take 1 tablet (20 mg total) by mouth daily. Patient taking differently: Take 40 mg by mouth daily. 09/05/22  Yes Andrey Farmer, PA-C  guaiFENesin (MUCINEX) 600 MG 12 hr tablet Take 600 mg by mouth 2 (two) times daily as needed for cough or to loosen phlegm.   Yes [provider]  metFORMIN (GLUCOPHAGE) 500 MG tablet Take 1 tablet (500 mg total) by mouth daily with breakfast. 10/29/22  Yes  Grayce Sessions, NP  OXYGEN Inhale 2 application  into the lungs at bedtime.   Yes [provider]  spironolactone (ALDACTONE) 25 MG tablet Take 1 tablet (25 mg total) by mouth daily. 09/05/22 05/11/23 Yes Andrey Farmer, PA-C  Accu-Chek Softclix Lancets lancets Use to check blood sugar once daily. 11/04/22   Hoy Register, MD  APIXABAN Everlene Balls) VTE STARTER PACK (10MG  AND 5MG ) Take as directed on package: start with two-5mg  tablets twice daily for 7 days. On day 8, switch to one-5mg  tablet twice daily. Patient not taking: Reported on 04/13/2023 01/30/23   Lorin Glass, MD  Blood Glucose Monitoring Suppl (ACCU-CHEK GUIDE) w/Device KIT Use to check blood sugar once daily. 11/04/22   Hoy Register, MD  Blood Pressure KIT Please check blood pressure every morning and keep a log. 08/19/22   Arrien, York Ram, MD  glucose blood (ACCU-CHEK GUIDE) test strip Use to check blood sugar once daily. 11/04/22   Hoy Register, MD    Physical Exam: Vitals:   04/13/23 2030 04/13/23 2219 04/13/23 2300 04/14/23 0024  BP: 90/61 (!) 144/79    Pulse:      Resp: 19 (!) 21 18   Temp:    98.2 F (36.8 C)  TempSrc:    Oral  SpO2:   100%    Physical Exam Constitutional:      Appearance: She is obese.  HENT:     Head: Normocephalic.     Mouth/Throat:     Mouth: Mucous membranes are moist.  Eyes:     Pupils: Pupils are equal, round, and reactive to light.  Cardiovascular:     Rate and Rhythm: Normal rate and regular rhythm.  Pulmonary:     Effort: Pulmonary effort is normal.     Breath sounds: Normal breath sounds.  Abdominal:     Palpations: Abdomen is soft.  Musculoskeletal:        General: Normal range of motion.     Cervical back: Normal range of motion.  Skin:    General: Skin is warm.     Capillary Refill: Capillary refill takes less than 2 seconds.  Neurological:     General: No focal deficit present.     Mental Status: She is alert and oriented to person, place, and  time.        Labs on Admission: I have personally reviewed the patients's labs and imaging studies.  Assessment/Plan Principal Problem:   CHF exacerbation (HCC)   # Acute on chronic heart failure reduced ejection fraction exacerbation, POA, active - Patient follows with cardiology has undergone workup for the etiology of her heart failure - Last echocardiogram November 2024 which showed EF 45% - Patient is not on goal-directed medical therapy - Intermittent compliance  Plan: Continue IV Lasix Continue spironolactone Patient will need to be started on beta-blocker and ACE inhibitor No need for repeat TTE CPAP ordered  # Morbid obesity-educated on weight loss extensively  # History of PE-start xarelto due  to poor compliance  #OSA- CPAP  Admission status: Inpatient Telemetry Cardiac  Certification: The appropriate patient status for this patient is INPATIENT. Inpatient status is judged to be reasonable and necessary in order to provide the required intensity of service to ensure the patient's safety. The patient's presenting symptoms, physical exam findings, and initial radiographic and laboratory data in the context of their chronic comorbidities is felt to place them at high risk for further clinical deterioration. Furthermore, it is not anticipated that the patient will be medically stable for discharge from the hospital within 2 midnights of admission.   * I certify that at the point of admission it is my clinical judgment that the patient will require inpatient hospital care spanning beyond 2 midnights from the point of admission due to high intensity of service, high risk for further deterioration and high frequency of surveillance required.Alan Mulder MD [facility] If 7PM-7AM, please contact night-coverage [email or web address]  04/14/2023, 12:26 AM

## 2023-04-14 NOTE — Consult Note (Addendum)
Cardiology Consultation   Patient ID: Christine Singleton MRN: [id number]; DOB: 1990/09/09  Admit date: 04/13/2023 Date of Consult: 04/14/2023  PCP:  Grayce Sessions, NP   [facility] Providers Cardiologist:  Maisie Fus, MD     Patient Profile:   Christine Singleton is a 33 y.o. 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 Renford Dills.  History of Present Illness:   Christine Singleton is a 33 yo female with PMH noted above. She has been followed by Dr. Wyline Mood, as well as briefly in the AHF clinic.  She was initially admitted 07/2022 with acute respiratory failure secondary to acute CHF.  Had mildly elevated troponin of 27 and 24.  D-dimer was elevated.  CT a chest negative for PE, faint GGO though limited due to poor signal-to-noise ratio.  Chest x-ray with evidence of CHF.  Echocardiogram showed LVEF of 45 to 50%, entire inferior wall and posterior walls hypokinetic, normal RV, no significant valvular disease.  She was diuresed with IV Lasix and given IV iron for iron deficiency anemia.  She was discharged with nocturnal O2.  Readmitted to [facility] shortly thereafter with acute on chronic CHF where she had gained 13 pounds but was eating more sodium.  She was discharged after being diuresed with IV Lasix.  Seen in the heart failure clinic 08/2022 with mild volume overload.  Started on Lasix 20 mg daily.   Last seen in the clinic on 09/17/2022 reported taking all of her medications and working.  She was continued on Lasix 20 mg daily, spironolactone 25 mg daily, Jardiance 10 mg daily.  Recommended for outpatient sleep study which was pending follow-up with the PCP.  Seen by Dr. Carolan Clines 04/02/2023 and follow-up.  She had recently been to the ED reporting shortness of breath on exertion and was diuresed and discharged.  Recommendations at this visit were to get  outpatient SPECT Myoview, Lasix was increased to 40 mg daily x 5 days with plans for outpatient bimah in a week.  Last seen in the clinic on 1/24 with Metta Clines and was noted to be tachycardic.  She had recently been set up for CT chest as an outpatient and was instructed to keep this appointment.  Beta-blocker was avoided in the setting of possible interstitial lung disease.  In talking with the patient, she reports continued shortness of breath despite compliance with her meds.  She has undergone her outpatient sleep study and is waiting to hear further recommendations.  Not received any new equipment still uses nocturnal O2.  In the ED her labs showed sodium 137, potassium 5.7, creatinine 0.73, albumin 3.3, BNP 226, stable troponin 23>20, WBC 14.6, hemoglobin 14.2, D-dimer 0.92.  Respiratory panel negative.  CT angio negative for PE, bilateral groundglass opacities favoring pulmonary edema, dilated main pulmonary artery.  EKG showed sinus tachycardia, 101 bpm, nonspecific changes.  She was admitted to internal medicine and started on IV Lasix.  Cardiology asked to evaluate.  Past Medical History:  Diagnosis Date   Gestational diabetes    With first pregnancy    Past Surgical History:  Procedure Laterality Date   CESAREAN SECTION     CESAREAN SECTION N/A 02/03/2016   Procedure: CESAREAN SECTION;  Surgeon: Lesly Dukes, MD;  Location: [facility] BIRTHING SUITES;  Service: Obstetrics;  Laterality: N/A;     Inpatient  Medications: Scheduled Meds:  rivaroxaban  20 mg Oral Daily   spironolactone  25 mg Oral Daily   Continuous Infusions:  PRN Meds: acetaminophen **OR** acetaminophen, ondansetron **OR** ondansetron (ZOFRAN) IV, oxyCODONE  Allergies:    Allergies  Allergen Reactions   Strawberry (Diagnostic) Anaphylaxis    Social History:   Social History   Socioeconomic History   Marital status: Single    Spouse name: Not on file   Number of children: 2   Years of education:  Not on file   Highest education level: High school graduate  Occupational History   Occupation: [employer]  Tobacco Use   Smoking status: Never   Smokeless tobacco: Never  Vaping Use   Vaping status: Never Used  Substance and Sexual Activity   Alcohol use: No   Drug use: No    Types: Marijuana    Comment: before admission, smokes everyday   Sexual activity: Yes    Birth control/protection: None  Other Topics Concern   Not on file  Social History Narrative   Not on file   Social Drivers of Health   Financial Resource Strain: High Risk (09/08/2022)   Overall Financial Resource Strain (CARDIA)    Difficulty of Paying Living Expenses: Hard  Food Insecurity: No Food Insecurity (04/14/2023)   Hunger Vital Sign    Worried About Running Out of Food in the Last Year: Never true    Ran Out of Food in the Last Year: Never true  Transportation Needs: No Transportation Needs (04/14/2023)   PRAPARE - Administrator, Civil Service (Medical): No    Lack of Transportation (Non-Medical): No  Recent Concern: Transportation Needs - Unmet Transportation Needs (01/27/2023)   PRAPARE - Administrator, Civil Service (Medical): Yes    Lack of Transportation (Non-Medical): Yes  Physical Activity: Not on file  Stress: Not on file  Social Connections: Not on file  Intimate Partner Violence: Not At Risk (04/14/2023)   Humiliation, Afraid, Rape, and Kick questionnaire    Fear of Current or Ex-Partner: No    Emotionally Abused: No    Physically Abused: No    Sexually Abused: No    Family History:    Family History  Problem Relation Age of Onset   Migraines Mother    Hypertension Mother    Hyperlipidemia Mother    Hypertension Father    Hyperlipidemia Father    Asthma Sister    Cancer Neg Hx    Diabetes Neg Hx      ROS:  Please see the history of present illness.   All other ROS reviewed and negative.     Physical Exam/Data:   Vitals:   04/14/23 0754 04/14/23  0801 04/14/23 1211 04/14/23 1212  BP: (!) 81/53 (!) 98/55  103/81  Pulse: (!) 115  (!) 1 (!) 102  Resp: 16   20  Temp: 98.2 F (36.8 C)   98.2 F (36.8 C)  TempSrc: Oral     SpO2: 98%   96%    Intake/Output Summary (Last 24 hours) at 04/14/2023 1303 Last data filed at 04/14/2023 0200 Gross per 24 hour  Intake --  Output 3400 ml  Net -3400 ml      04/13/2023    1:49 PM 04/10/2023   11:01 AM 04/02/2023    2:28 PM  Last 3 Weights  Weight (lbs) 356 lb 352 lb 3.2 oz 345 lb  Weight (kg) 161.481 kg 159.757 kg 156.491 kg  There is no height or weight on file to calculate BMI.  General:  Obese, young female, sitting up in bed. No distress HEENT: normal Neck: Unable to assess due to neck girth Vascular: No carotid bruits; Distal pulses 2+ bilaterally Cardiac:  normal S1, S2; tachycardia; no murmur  Lungs:  diminished in bases Abd: soft, nontender, no hepatomegaly  Ext: 1+ bilateral LE edema Musculoskeletal:  No deformities, BUE and BLE strength normal and equal Skin: warm and dry  Neuro:  CNs 2-12 intact, no focal abnormalities noted Psych:  Normal affect   EKG:  The EKG was personally reviewed and demonstrates: Sinus tachycardia, 101 bpm, nonspecific changes Telemetry:  Telemetry was personally reviewed and demonstrates:  Sinus Tachycardia  Relevant CV Studies:  Echo: 01/2023  IMPRESSIONS     1. Left ventricular ejection fraction, by estimation, is 45 to 50%. The  left ventricle has mildly decreased function. The left ventricle  demonstrates global hypokinesis. Left ventricular diastolic parameters  were normal.   2. Right ventricular systolic function is mildly reduced. The right  ventricular size is normal. Tricuspid regurgitation signal is inadequate  for assessing PA pressure.   3. The mitral valve is normal in structure. No evidence of mitral valve  regurgitation. No evidence of mitral stenosis.   4. The aortic valve is normal in structure. Aortic valve  regurgitation is  not visualized. No aortic stenosis is present.   5. The inferior vena cava is normal in size with greater than 50%  respiratory variability, suggesting right atrial pressure of 3 mmHg.   FINDINGS   Left Ventricle: Left ventricular ejection fraction, by estimation, is 45  to 50%. The left ventricle has mildly decreased function. The left  ventricle demonstrates global hypokinesis. The left ventricular internal  cavity size was normal in size. There is   no left ventricular hypertrophy. Left ventricular diastolic parameters  were normal. Normal left ventricular filling pressure.   Right Ventricle: The right ventricular size is normal. No increase in  right ventricular wall thickness. Right ventricular systolic function is  mildly reduced. Tricuspid regurgitation signal is inadequate for assessing  PA pressure.   Left Atrium: Left atrial size was normal in size.   Right Atrium: Right atrial size was normal in size.   Pericardium: There is no evidence of pericardial effusion.   Mitral Valve: The mitral valve is normal in structure. No evidence of  mitral valve regurgitation. No evidence of mitral valve stenosis.   Tricuspid Valve: The tricuspid valve is normal in structure. Tricuspid  valve regurgitation is trivial. No evidence of tricuspid stenosis.   Aortic Valve: The aortic valve is normal in structure. Aortic valve  regurgitation is not visualized. No aortic stenosis is present.   Pulmonic Valve: The pulmonic valve was normal in structure. Pulmonic valve  regurgitation is not visualized. No evidence of pulmonic stenosis.   Aorta: The aortic root is normal in size and structure.   Venous: The inferior vena cava is normal in size with greater than 50%  respiratory variability, suggesting right atrial pressure of 3 mmHg.   IAS/Shunts: No atrial level shunt detected by color flow Doppler.    Laboratory Data:  High Sensitivity Troponin:   Recent Labs  Lab  03/30/23 2115 03/30/23 2310 04/13/23 1647 04/13/23 1959  TROPONINIHS 20* 21* 23* 20*     Chemistry Recent Labs  Lab 04/13/23 1647 04/14/23 0232  NA 137 136  K 5.7* 4.3  CL 98 92*  CO2 29 33*  GLUCOSE 85  120*  BUN 7 7  CREATININE 0.73 0.73  CALCIUM 9.0 9.0  GFRNONAA >60 >60  ANIONGAP 10 11    Recent Labs  Lab 04/13/23 1647  PROT 7.1  ALBUMIN 3.3*  AST 28  ALT 18  ALKPHOS 106  BILITOT 1.4*   Lipids No results for input(s): "CHOL", "TRIG", "HDL", "LABVLDL", "LDLCALC", "CHOLHDL" in the last 168 hours.  Hematology Recent Labs  Lab 04/13/23 1647 04/14/23 0232  WBC 14.6* 15.7*  RBC 6.18* 6.30*  HGB 14.2 14.5  HCT 45.5 46.1*  MCV 73.6* 73.2*  MCH 23.0* 23.0*  MCHC 31.2 31.5  RDW 20.4* 20.3*  PLT 398 433*   Thyroid No results for input(s): "TSH", "FREET4" in the last 168 hours.  BNP Recent Labs  Lab 04/13/23 1647  BNP 226.2*    DDimer  Recent Labs  Lab 04/13/23 1647  DDIMER 0.92*     Radiology/Studies:  CT Angio Chest PE W and/or Wo Contrast Result Date: 04/13/2023 CLINICAL DATA:  Shortness of breath.  CHF.  PE suspected. EXAM: CT ANGIOGRAPHY CHEST WITH CONTRAST TECHNIQUE: Multidetector CT imaging of the chest was performed using the standard protocol during bolus administration of intravenous contrast. Multiplanar CT image reconstructions and MIPs were obtained to evaluate the vascular anatomy. RADIATION DOSE REDUCTION: This exam was performed according to the departmental dose-optimization program which includes automated exposure control, adjustment of the mA and/or kV according to patient size and/or use of iterative reconstruction technique. CONTRAST:  75mL OMNIPAQUE IOHEXOL 350 MG/ML SOLN COMPARISON:  Same-day radiograph and CTA chest 01/26/2023 FINDINGS: Cardiovascular: Negative for acute pulmonary embolism. The main pulmonary artery is dilated measuring 37 mm in maximum diameter. Cardiomegaly. No pericardial effusion. Normal caliber thoracic aorta.  Mediastinum/Nodes: No thoracic adenopathy by size. Trachea and esophagus are unremarkable. Lungs/Pleura: Bilateral patchy ground-glass opacities. No pleural effusion or pneumothorax. Upper Abdomen: No acute abnormality. Musculoskeletal: No acute fracture. Review of the MIP images confirms the above findings. IMPRESSION: 1. Negative for acute pulmonary embolism. 2. Bilateral patchy ground-glass opacities, favor pulmonary edema. 3. Dilated main pulmonary artery, which can be seen in the setting of pulmonary hypertension. Electronically Signed   By: Minerva Fester M.D.   On: 04/13/2023 21:10   DG Chest 2 View Result Date: 04/13/2023 CLINICAL DATA:  Shortness of breath.  CHF. EXAM: CHEST - 2 VIEW COMPARISON:  Radiograph 03/30/2023.  CT 01/26/2023 FINDINGS: Cardiomegaly is stable. Unchanged mediastinal contours. Vascular congestion without frank pulmonary edema. Chronic elevation of right hemidiaphragm. No significant pleural effusion. No focal airspace disease. No pneumothorax. Stable osseous structures. IMPRESSION: Stable cardiomegaly with vascular congestion. Electronically Signed   By: Narda Rutherford M.D.   On: 04/13/2023 16:58     Assessment and Plan:   Christine Singleton is a 33 y.o. 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 Renford Dills.  Acute on Chronic HFmrEF -- Echocardiogram from 11/24 with LVEF of 45 to 50%, global hypokinesis. Likely in the setting of OHS/OSA -- Reports compliance with her home Lasix 20 mg daily, spironolactone 25 mg daily and Jardiance but came in short of breath with increasing lower extremity edema -- BNP 226, CT chest with pulmonary edema -- s/p IV lasix 40mg  x1 in the ED, no UOP noted yet -- continue IV lasix 40mg  daily, spiro 25mg  daily and Jardiance -- notes indicate outpatient plans for possible spect myoview, though with her body habitus  likely to have poor  imaging  HTN -- blood pressures have been variable in the ED -- continue spiro, would add low dose Toprol if BP able to tolerate tomorrow  OSA -- Uses nocturnal O2, underwent home sleep study and waiting for equipment  Morbid Obesity  -- BMI 65 -- On Trulicity PTA  History of PE -- Reports not having been on her Eliquis PTA as she didn't realize she needed to continue it -- now on Xarelto per primary team   Leukocytosis -- no infectious symptoms, resp panel negative  -- per primary  For questions or updates, please contact [facility] Please consult [email or web address] for contact info under    Signed, Laverda Page, NP  04/14/2023 1:03 PM  I have personally seen and examined this patient. I agree with the assessment and plan as outlined above.  33 yo morbidly obese female with history of mild LV systolic dysfunction (LVEF around 45-50% by echo in November 2024), HTN, chronic systolic CHF, PE in November 2024 and sleep apnea admitted with c/o dyspnea.She has been taking Lasix 20 mg by mouth daily at home. Recent weight gain and LE edema. She stopped her Xarelto which was prescribed for PE in November 2024.  Today she is tachycardic in the ED with sinus tach at 120 bpm on telemetry.  EKG reviewed by me and shows sinus tachycardia with non-specific T wave abnormalities Labs reviewed by me. Troponin 23 --> 20.  Chest CTA without evidence of PE but there is pulmonary edema My exam: Morbidly obese female in NAD, resting comfortably CV: Tachy, regular, no murmurs  Lungs: clear anteriorly, unable to hear any crackles. Ext: 1+ bilateral LE edema  Plan: Acute on chronic systolic CHF with mildly decreased LV function: She has evidence of volume overload on exam and on chest CT. Agree with diuresis with IV Lasix today. Agree with restarting Xarelto given recent PE. She is tachycardic today. Consider addition of a beta blocker tomorrow if her BP tolerates. There has been discussion of  an ischemic workup as an outpatient. Given her morbid obesity, non-invasive testing may be challenging. 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  We will follow with you.   Verne Carrow, MD, [facility] 04/14/2023 2:14 PM

## 2023-04-14 NOTE — Progress Notes (Signed)
Brief Same day note:  Patient is a 33 year old female with history of morbid obesity, PE on Eliquis, OSA not on BiPAP, HFrEF, hypertension who presented with shortness of breath.  She was recently seen by cardiology 2 weeks ago and was being worked up as outpatient to  determine etiology of heart failure, on oral Lasix at home.  Report of progressive shortness of breath, weight gain. On presentation, she was hemodynamically stable.  Lab work showed elevated BNP, potassium of 5.7, WBC count of 14.6.  CTA of chest did not show any PE but showed pulmonary edema, pulm hypertension.  Chest x-ray cardiomegaly, vascular congestion.  Patient was started on IV Lasix.  Cardiology consulted.  Patient seen and examined at bedside today.  EKG monitor showed sinus tachycardia.  She was on room air.  Denied any chest pain during my evaluation but complains of some upper abdominal discomfort.  Assessment and plan  Acute on chronic HFrEF: Last echo showed EF of 45 to 50%, no pulmonary hypertension.  On Lasix at home.  Presented with dyspnea, elevated BNP.  Chest imagings suggestive of volume overload.  Started on IV Lasix now stopped due to soft blood pressure Will get cardiology consulted.  There was plan to get a SPECT/myocardial perfusion imaging as an outpatient.  Further management as per cardiology She is in sinus tachycardia  History of PE: On xarelto  Leukocytosis: No signs of infectious process.  Continue to monitor  Elevated pain: No ischemic symptoms.  Flat trend.  Likely from demand ischemia  History of OSA/OHS: Underwent sleep study and was supposed to get BiPAP machine but not delivered yet  Morbid obesity: BMI of 65

## 2023-04-15 ENCOUNTER — Other Ambulatory Visit (HOSPITAL_COMMUNITY): Payer: Self-pay

## 2023-04-15 DIAGNOSIS — I5043 Acute on chronic combined systolic (congestive) and diastolic (congestive) heart failure: Secondary | ICD-10-CM | POA: Diagnosis not present

## 2023-04-15 DIAGNOSIS — I509 Heart failure, unspecified: Secondary | ICD-10-CM | POA: Diagnosis not present

## 2023-04-15 LAB — BLOOD GAS, ARTERIAL
Acid-Base Excess: 15.3 mmol/L — ABNORMAL HIGH (ref 0.0–2.0)
Bicarbonate: 41.2 mmol/L — ABNORMAL HIGH (ref 20.0–28.0)
Drawn by: 36527
O2 Saturation: 91.6 %
Patient temperature: 37
pCO2 arterial: 54 mm[Hg] — ABNORMAL HIGH (ref 32–48)
pH, Arterial: 7.49 — ABNORMAL HIGH (ref 7.35–7.45)
pO2, Arterial: 62 mm[Hg] — ABNORMAL LOW (ref 83–108)

## 2023-04-15 LAB — URINALYSIS, ROUTINE W REFLEX MICROSCOPIC
Bilirubin Urine: NEGATIVE
Glucose, UA: NEGATIVE mg/dL
Hgb urine dipstick: NEGATIVE
Ketones, ur: NEGATIVE mg/dL
Leukocytes,Ua: NEGATIVE
Nitrite: NEGATIVE
Protein, ur: NEGATIVE mg/dL
Specific Gravity, Urine: 1.008 (ref 1.005–1.030)
pH: 8 (ref 5.0–8.0)

## 2023-04-15 LAB — BASIC METABOLIC PANEL
Anion gap: 11 (ref 5–15)
BUN: 11 mg/dL (ref 6–20)
CO2: 33 mmol/L — ABNORMAL HIGH (ref 22–32)
Calcium: 9 mg/dL (ref 8.9–10.3)
Chloride: 91 mmol/L — ABNORMAL LOW (ref 98–111)
Creatinine, Ser: 0.75 mg/dL (ref 0.44–1.00)
GFR, Estimated: >60 mL/min (ref >60)
Glucose, Bld: 128 mg/dL — ABNORMAL HIGH (ref 70–99)
Potassium: 4 mmol/L (ref 3.5–5.1)
Sodium: 135 mmol/L (ref 135–145)

## 2023-04-15 LAB — CBC
HCT: 45 % (ref 36.0–46.0)
Hemoglobin: 14.5 g/dL (ref 12.0–15.0)
MCH: 23.2 pg — ABNORMAL LOW (ref 26.0–34.0)
MCHC: 32.2 g/dL (ref 30.0–36.0)
MCV: 71.9 fL — ABNORMAL LOW (ref 80.0–100.0)
Platelets: 404 10*3/uL — ABNORMAL HIGH (ref 150–400)
RBC: 6.26 MIL/uL — ABNORMAL HIGH (ref 3.87–5.11)
RDW: 20.2 % — ABNORMAL HIGH (ref 11.5–15.5)
WBC: 14.2 10*3/uL — ABNORMAL HIGH (ref 4.0–10.5)
nRBC: 0.1 % (ref 0.0–0.2)

## 2023-04-15 LAB — GLUCOSE, CAPILLARY
Glucose-Capillary: 110 mg/dL — ABNORMAL HIGH (ref 70–99)
Glucose-Capillary: 117 mg/dL — ABNORMAL HIGH (ref 70–99)
Glucose-Capillary: 122 mg/dL — ABNORMAL HIGH (ref 70–99)
Glucose-Capillary: 93 mg/dL (ref 70–99)

## 2023-04-15 MED ORDER — INSULIN ASPART 100 UNIT/ML IJ SOLN: 0.0000 [IU] | Freq: Three times a day (TID) | INTRAMUSCULAR | Status: DC

## 2023-04-15 MED ORDER — METOPROLOL SUCCINATE ER 25 MG PO TB24: 25.0000 mg | ORAL_TABLET | Freq: Every day | ORAL | Status: DC

## 2023-04-15 MED ORDER — EMPAGLIFLOZIN 10 MG PO TABS
10.0000 mg | ORAL_TABLET | Freq: Every day | ORAL | Status: DC
  Administered 2023-04-15 – 2023-04-17 (×3): 10 mg via ORAL
  Filled 2023-04-15 (×3): qty 1

## 2023-04-15 NOTE — Progress Notes (Addendum)
Patient Name: Christine Singleton Date of Encounter: 04/15/2023 [facility] Cardiologist: Maisie Fus, MD   Interval Summary  .    Reports feeling significantly improved from yesterday.  The CPAP helped her and she feels very well rested.  Vital Signs .    Vitals:   04/15/23 0300 04/15/23 0329 04/15/23 0513 04/15/23 0808  BP:  115/72  125/73  Pulse:  (!) 105  (!) 105  Resp:  18  16  Temp:  98.5 F (36.9 C)    TempSrc:  Oral    SpO2: 98% 98%  96%  Weight:   (!) 158.1 kg   Height:        Intake/Output Summary (Last 24 hours) at 04/15/2023 0915 Last data filed at 04/15/2023 0843 Gross per 24 hour  Intake --  Output 300 ml  Net -300 ml      04/15/2023    5:13 AM 04/14/2023    2:56 PM 04/13/2023    1:49 PM  Last 3 Weights  Weight (lbs) 348 lb 8.8 oz 345 lb 14.4 oz 356 lb  Weight (kg) 158.1 kg 156.9 kg 161.481 kg      Telemetry/ECG    Sinus rhythm heart rates in the 90s to 100s- Personally Reviewed  CV Studies    Echocardiogram 01/27/2023 1. Left ventricular ejection fraction, by estimation, is 45 to 50%. The  left ventricle has mildly decreased function. The left ventricle  demonstrates global hypokinesis. Left ventricular diastolic parameters  were normal.   2. Right ventricular systolic function is mildly reduced. The right  ventricular size is normal. Tricuspid regurgitation signal is inadequate  for assessing PA pressure.   3. The mitral valve is normal in structure. No evidence of mitral valve  regurgitation. No evidence of mitral stenosis.   4. The aortic valve is normal in structure. Aortic valve regurgitation is  not visualized. No aortic stenosis is present.   5. The inferior vena cava is normal in size with greater than 50%  respiratory variability, suggesting right atrial pressure of 3 mmHg.     Physical Exam .   GEN: No acute distress.   Neck: Difficult to assess JVD Cardiac: RRR, no murmurs, rubs, or gallops.  Respiratory: Clear to  auscultation bilaterally. GI: Soft, nontender, non-distended  MS: 1+ edema  Patient Profile    Christine Singleton is a 33 y.o. female has hx of heart failure with mildly reduced EF, PE 2024, OSA on BiPAP, hypertension.  Admitted for CHF exacerbation   Assessment & Plan .     Acute on chronic heart failure with mildy reduced EF 45-50% Reports trying to be diuresed outpatient however continued to shortness of breath.  She has global hypokinesis with mildly reduced RV function.  Still volume up however body habitus difficult to assess accurately.  Diuresing well -3.7 L. Continue IV Lasix 40 mg daily. GDMT: Continue Jardiance 10 mg, spironolactone 25 mg.  Will add toprol XL after diuresis. At home was on Lasix 20 mg, was increased to 40 however likely was too volume up to sufficiently diuresed.  Likely needs to be on 40 mg at discharge.  Add ACE/ARB as tolerated She has outpatient plans for SPECT Myoview however suspect with her body habitus this would result in poor image quality.  Can consider cardiac catheterization however EF being mildly reduced with global hypokinesis less likely to be ischemic. Reports dry weight is around 340.  Today she is 348 pounds, question accuracy with almost 4L removed.  OSA on CPAP Reports doing very well with this and feels much more energetic.  Still waiting on equipment outpatient.  History of PE in 2024 Has not been taking her Eliquis for over a month.  She knows now need for ongoing anticoagulation.  She is on Xarelto here.  Defer to MD about duration now.  BMI 64 All the above likely improved with some weight loss.  Encouraged.  She is interested in loss medications.  Discussed need for improvement in diet and exercise first.    For questions or updates, please contact [facility] Please consult [email or web address] for contact info under        Signed, Abagail Kitchens, PA-C

## 2023-04-15 NOTE — Progress Notes (Signed)
Patient inquired about home use CPAP, patient currently using hospital CPAP at bedside. RN notified CM and made aware.

## 2023-04-15 NOTE — TOC Initial Note (Signed)
Transition of Care [facility]) - Initial/Assessment Note    Patient Details  Name: Christine Singleton MRN: [id number] Date of Birth: 14-Dec-1990  [facility]) CM/SW Contact:    Lawerance Sabal, RN Phone Number: 04/15/2023, 2:12 PM  Clinical Narrative:                  Sherron Monday w attending and community provider.  Plan will be to try to obtain BiPAP before DC.  Referral has been provided to [facility]  Expected Discharge Plan: Home/Self Care Barriers to Discharge: Continued Medical Work up   Patient Goals and CMS Choice            Expected Discharge Plan and Services                                              Prior Living Arrangements/Services                       Activities of Daily Living   ADL Screening (condition at time of admission) Independently performs ADLs?: Yes (appropriate for developmental age) Is the patient deaf or have difficulty hearing?: No Does the patient have difficulty seeing, even when wearing glasses/contacts?: Yes Does the patient have difficulty concentrating, remembering, or making decisions?: No  Permission Sought/Granted                  Emotional Assessment              Admission diagnosis:  Hypoxia [R09.02] CHF exacerbation (HCC) [I50.9] Acute congestive heart failure, unspecified heart failure type (HCC) [I50.9] Patient Active Problem List   Diagnosis Date Noted   CHF exacerbation (HCC) 04/13/2023   Pulmonary embolism (HCC) 01/26/2023   OSA (obstructive sleep apnea) 01/07/2023   Acute on chronic systolic CHF (congestive heart failure), NYHA class 3 (HCC) 09/05/2022   Essential hypertension 08/19/2022   Hyponatremia 08/18/2022   Class 3 obesity 08/16/2022   Acute on chronic diastolic CHF (congestive heart failure) (HCC) 08/14/2022   Acute respiratory failure with hypoxia (HCC) 08/14/2022   Non-insulin dependent type 2 diabetes mellitus (HCC) 08/14/2022   Leukocytosis 08/14/2022   Iron deficiency  anemia 08/14/2022   History of cesarean delivery 04/23/2016   Delivery by elective cesarean section 02/03/2016   PCP:  Grayce Sessions, NP Pharmacy:   [facility] [place] [facility] [address] [place] [address] Phone: [phone] Fax: [phone]  [facility] [address] [place] [address] Phone: [phone] Fax: [phone]  [facility] [place] [facility] [address] [place] [address] Phone: [phone] Fax: [phone]     Social Drivers of Health (SDOH) Social History: SDOH Screenings   Food Insecurity: No Food Insecurity (04/14/2023)  Housing: Low Risk  (04/14/2023)  Transportation Needs: No Transportation Needs (04/14/2023)  Recent Concern: Transportation Needs - Unmet Transportation Needs (01/27/2023)  Utilities: Not At Risk (04/14/2023)  Alcohol Screen: Low Risk  (08/18/2022)  Depression (PHQ2-9): Medium Risk (02/10/2023)  Financial Resource Strain: High Risk (09/08/2022)  Tobacco Use: Low Risk  (04/14/2023)   SDOH Interventions:     Readmission Risk Interventions     No data to display

## 2023-04-15 NOTE — Plan of Care (Signed)
Problem: Education: Goal: Knowledge of General Education information will improve Description: Including pain rating scale, medication(s)/side effects and non-pharmacologic comfort measures Outcome: Progressing   Problem: Health Behavior/Discharge Planning: Goal: Ability to manage health-related needs will improve Outcome: Progressing   Problem: Clinical Measurements: Goal: Will remain free from infection Outcome: Progressing

## 2023-04-15 NOTE — Progress Notes (Signed)
PROGRESS NOTE  Christine Singleton  GEX:[id number] DOB: 31-Aug-1990 DOA: 04/13/2023 PCP: Grayce Sessions, NP   Brief Narrative: Patient is a 33 year old female with history of morbid obesity, PE on Eliquis, OSA not on cpap, on 2 to 3 days of oxygen at night chronically,, HFrEF, hypertension who presented with shortness of breath.  She was recently seen by cardiology 2 weeks ago and was being worked up as outpatient to  determine etiology of heart failure, on oral Lasix at home.  Report of progressive shortness of breath, weight gain. On presentation, she was hemodynamically stable.  Lab work showed elevated BNP, potassium of 5.7, WBC count of 14.6.  CTA of chest did not show any PE but showed pulmonary edema, pulm hypertension.  Chest x-ray cardiomegaly, vascular congestion.  Patient was started on IV Lasix.  Cardiology consulted and following  Assessment & Plan:  Principal Problem:   CHF exacerbation (HCC) Active Problems:   Acute on chronic systolic CHF (congestive heart failure), NYHA class 3 (HCC)  Acute on chronic HFrEF: Last echo showed EF of 45 to 50%, no pulmonary hypertension.  On Lasix at home.  Presented with dyspnea, elevated BNP.  Chest imagings suggestive of volume overload.  Started on IV Lasix n. here was plan to get a SPECT/myocardial perfusion imaging as an outpatient.  Further management as per cardiology.  Currently on IV Lasix 40 mg daily.  Also on Jardiance, spironolactone.  Plan to add Toprol after diuresis.  History of PE: On xarelto  Leukocytosis: No signs of infectious process.    No signs of pneumonia, no dysuria.  UA not suspicious for UTI.  On reviewing her previous lab, she has chronic leukocytosis.  She can follow-up with hematology as an outpatient.  Elevated troponin: No ischemic symptoms.  Flat trend.  Likely from demand ischemia  Diabetes type 2: Takes Trulicity, metformin, Jardiance at home.  Currently on sliding scale.  Monitor blood sugars  History of  OSA/OHS/chronic respiratory insufficiency: Underwent sleep study as per her PCP and was supposed to get CPAP machine but not delivered yet.  Will consult TOC. She is chronically on 3 L of oxygen for sleep  Morbid obesity: BMI of 65         DVT prophylaxis:Place TED hose Start: 04/13/23 2207 rivaroxaban (XARELTO) tablet 20 mg     Code Status: Full Code  Family Communication: None at bedside  Patient status:Inpatient  Patient is from :home  Anticipated discharge WN:[id number]  Estimated DC date:1-2 days   Consultants: Cardiology  Procedures:None  Antimicrobials:  Anti-infectives (From admission, onward)    None       Subjective:  Patient seen and examined at bedside today.  She was very comfortable.  Sitting at the edge of the bed.  Not in respiratory distress.  She is on oxygen at night.  Speaking in full sentences.  Lower extremity edema has significantly improved.  Objective: Vitals:   04/15/23 0300 04/15/23 0329 04/15/23 0513 04/15/23 0808  BP:  115/72  125/73  Pulse:  (!) 105  (!) 105  Resp:  18  16  Temp:  98.5 F (36.9 C)    TempSrc:  Oral    SpO2: 98% 98%  96%  Weight:   (!) 158.1 kg   Height:        Intake/Output Summary (Last 24 hours) at 04/15/2023 1110 Last data filed at 04/15/2023 0843 Gross per 24 hour  Intake --  Output 300 ml  Net -300 ml   Ceasar Mons  Weights   04/14/23 1456 04/15/23 0513  Weight: (!) 156.9 kg (!) 158.1 kg    Examination:  General exam: Overall comfortable, not in distress, morbidly obese HEENT: PERRL Respiratory system:  no wheezes or crackles , diminished air sounds bilaterally on bases Cardiovascular system: S1 & S2 heard, RRR.  Gastrointestinal system: Abdomen is nondistended, soft and nontender. Central nervous system: Alert and oriented Extremities: Very trace bilateral lower extremity edema, no clubbing ,no cyanosis Skin: No rashes, no ulcers,no icterus     Data Reviewed: I have personally reviewed following labs  and imaging studies  CBC: Recent Labs  Lab 04/13/23 1647 04/14/23 0232 04/15/23 0326  WBC 14.6* 15.7* 14.2*  NEUTROABS 10.0*  --   --   HGB 14.2 14.5 14.5  HCT 45.5 46.1* 45.0  MCV 73.6* 73.2* 71.9*  PLT 398 433* 404*   Basic Metabolic Panel: Recent Labs  Lab 04/13/23 1647 04/14/23 0232 04/15/23 0326  NA 137 136 135  K 5.7* 4.3 4.0  CL 98 92* 91*  CO2 29 33* 33*  GLUCOSE 85 120* 128*  BUN 7 7 11   CREATININE 0.73 0.73 0.75  CALCIUM 9.0 9.0 9.0     Recent Results (from the past 240 hours)  Resp panel by RT-PCR (RSV, Flu A&B, Covid) Anterior Nasal Swab     Status: None   Collection Time: 04/13/23  4:47 PM   Specimen: Anterior Nasal Swab  Result Value Ref Range Status   SARS Coronavirus 2 by RT PCR NEGATIVE NEGATIVE Final   Influenza A by PCR NEGATIVE NEGATIVE Final   Influenza B by PCR NEGATIVE NEGATIVE Final    Comment: (NOTE) The Xpert Xpress SARS-CoV-2/FLU/RSV plus assay is intended as an aid in the diagnosis of influenza from Nasopharyngeal swab specimens and should not be used as a sole basis for treatment. Nasal washings and aspirates are unacceptable for Xpert Xpress SARS-CoV-2/FLU/RSV testing.  Fact Sheet for Patients: BloggerCourse.com  Fact Sheet for Healthcare Providers: SeriousBroker.it  This test is not yet approved or cleared by the [place] FDA and has been authorized for detection and/or diagnosis of SARS-CoV-2 by FDA under an Emergency Use Authorization (EUA). This EUA will remain in effect (meaning this test can be used) for the duration of the COVID-19 declaration under Section 564(b)(1) of the Act, 21 U.S.C. section 360bbb-3(b)(1), unless the authorization is terminated or revoked.     Resp Syncytial Virus by PCR NEGATIVE NEGATIVE Final    Comment: (NOTE) Fact Sheet for Patients: BloggerCourse.com  Fact Sheet for Healthcare  Providers: SeriousBroker.it  This test is not yet approved or cleared by the [place] FDA and has been authorized for detection and/or diagnosis of SARS-CoV-2 by FDA under an Emergency Use Authorization (EUA). This EUA will remain in effect (meaning this test can be used) for the duration of the COVID-19 declaration under Section 564(b)(1) of the Act, 21 U.S.C. section 360bbb-3(b)(1), unless the authorization is terminated or revoked.  Performed at [facility], [address], [place], [place] [address]      Radiology Studies: CT Angio Chest PE W and/or Wo Contrast Result Date: 04/13/2023 CLINICAL DATA:  Shortness of breath.  CHF.  PE suspected. EXAM: CT ANGIOGRAPHY CHEST WITH CONTRAST TECHNIQUE: Multidetector CT imaging of the chest was performed using the standard protocol during bolus administration of intravenous contrast. Multiplanar CT image reconstructions and MIPs were obtained to evaluate the vascular anatomy. RADIATION DOSE REDUCTION: This exam was performed according to the departmental dose-optimization program which  includes automated exposure control, adjustment of the mA and/or kV according to patient size and/or use of iterative reconstruction technique. CONTRAST:  75mL OMNIPAQUE IOHEXOL 350 MG/ML SOLN COMPARISON:  Same-day radiograph and CTA chest 01/26/2023 FINDINGS: Cardiovascular: Negative for acute pulmonary embolism. The main pulmonary artery is dilated measuring 37 mm in maximum diameter. Cardiomegaly. No pericardial effusion. Normal caliber thoracic aorta. Mediastinum/Nodes: No thoracic adenopathy by size. Trachea and esophagus are unremarkable. Lungs/Pleura: Bilateral patchy ground-glass opacities. No pleural effusion or pneumothorax. Upper Abdomen: No acute abnormality. Musculoskeletal: No acute fracture. Review of the MIP images confirms the above findings. IMPRESSION: 1. Negative for acute pulmonary embolism. 2. Bilateral patchy  ground-glass opacities, favor pulmonary edema. 3. Dilated main pulmonary artery, which can be seen in the setting of pulmonary hypertension. Electronically Signed   By: Minerva Fester M.D.   On: 04/13/2023 21:10   DG Chest 2 View Result Date: 04/13/2023 CLINICAL DATA:  Shortness of breath.  CHF. EXAM: CHEST - 2 VIEW COMPARISON:  Radiograph 03/30/2023.  CT 01/26/2023 FINDINGS: Cardiomegaly is stable. Unchanged mediastinal contours. Vascular congestion without frank pulmonary edema. Chronic elevation of right hemidiaphragm. No significant pleural effusion. No focal airspace disease. No pneumothorax. Stable osseous structures. IMPRESSION: Stable cardiomegaly with vascular congestion. Electronically Signed   By: Narda Rutherford M.D.   On: 04/13/2023 16:58    Scheduled Meds:  empagliflozin  10 mg Oral Daily   furosemide  40 mg Intravenous Daily   rivaroxaban  20 mg Oral Daily   spironolactone  25 mg Oral Daily   Continuous Infusions:   LOS: 2 days   Burnadette Pop, MD [facility] P1/29/2025, 11:10 AM

## 2023-04-16 ENCOUNTER — Other Ambulatory Visit (HOSPITAL_COMMUNITY): Payer: Self-pay

## 2023-04-16 ENCOUNTER — Telehealth (HOSPITAL_COMMUNITY): Payer: Self-pay | Admitting: Pharmacy Technician

## 2023-04-16 DIAGNOSIS — I509 Heart failure, unspecified: Secondary | ICD-10-CM | POA: Diagnosis not present

## 2023-04-16 DIAGNOSIS — I5043 Acute on chronic combined systolic (congestive) and diastolic (congestive) heart failure: Secondary | ICD-10-CM | POA: Diagnosis not present

## 2023-04-16 LAB — BASIC METABOLIC PANEL
Anion gap: 12 (ref 5–15)
BUN: 14 mg/dL (ref 6–20)
CO2: 29 mmol/L (ref 22–32)
Calcium: 9 mg/dL (ref 8.9–10.3)
Chloride: 94 mmol/L — ABNORMAL LOW (ref 98–111)
Creatinine, Ser: 0.77 mg/dL (ref 0.44–1.00)
GFR, Estimated: >60 mL/min (ref >60)
Glucose, Bld: 108 mg/dL — ABNORMAL HIGH (ref 70–99)
Potassium: 4.9 mmol/L (ref 3.5–5.1)
Sodium: 135 mmol/L (ref 135–145)

## 2023-04-16 LAB — GLUCOSE, CAPILLARY
Glucose-Capillary: 107 mg/dL — ABNORMAL HIGH (ref 70–99)
Glucose-Capillary: 97 mg/dL (ref 70–99)
Glucose-Capillary: 97 mg/dL (ref 70–99)
Glucose-Capillary: 95 mg/dL (ref 70–99)

## 2023-04-16 NOTE — TOC Progression Note (Addendum)
Transition of Care [facility]) - Progression Note    Patient Details  Name: Christine Singleton MRN: [id number] Date of Birth: 08-01-1990  [facility]) CM/SW Contact  Lockie Pares, RN Phone Number: 04/16/2023, 1:23 PM  Clinical Narrative:     Leitha Schuller from [facility] concerning Bipap. He is emailing this RN CM a form for the physician. Do not see where the patient is on bipap here, however Co2 on ABG is elevated. 1430 Bipap orders signed by MD and sent to [email or web address] Expected Discharge Plan: Home/Self Care Barriers to Discharge: Continued Medical Work up  Expected Discharge Plan and Services        Home with DME                                       Social Determinants of Health (SDOH) Interventions SDOH Screenings   Food Insecurity: No Food Insecurity (04/14/2023)  Housing: Low Risk  (04/14/2023)  Transportation Needs: No Transportation Needs (04/14/2023)  Recent Concern: Transportation Needs - Unmet Transportation Needs (01/27/2023)  Utilities: Not At Risk (04/14/2023)  Alcohol Screen: Low Risk  (08/18/2022)  Depression (PHQ2-9): Medium Risk (02/10/2023)  Financial Resource Strain: High Risk (09/08/2022)  Tobacco Use: Low Risk  (04/14/2023)    Readmission Risk Interventions     No data to display

## 2023-04-16 NOTE — Progress Notes (Signed)
Rounding Note    Patient Name: Christine Singleton Date of Encounter: 04/16/2023  [facility] Cardiologist: Maisie Fus, MD 3  Subjective  She noted some shortness of breath overnight Sats went down to 88% overnight  Inpatient Medications    Scheduled Meds:  empagliflozin  10 mg Oral Daily   furosemide  40 mg Intravenous Daily   insulin aspart  0-15 Units Subcutaneous TID WC   rivaroxaban  20 mg Oral Daily   spironolactone  25 mg Oral Daily   Continuous Infusions:  PRN Meds: acetaminophen **OR** acetaminophen, ondansetron **OR** ondansetron (ZOFRAN) IV, oxyCODONE   Vital Signs    Vitals:   04/16/23 0051 04/16/23 0300 04/16/23 0627 04/16/23 0846  BP: 107/80  118/80 106/77  Pulse: 86   100  Resp: 20   18  Temp: 98.5 F (36.9 C)   98.5 F (36.9 C)  TempSrc: Oral   Oral  SpO2: 99% 99% (!) 88% 92%  Weight:      Height:        Intake/Output Summary (Last 24 hours) at 04/16/2023 0925 Last data filed at 04/15/2023 2300 Gross per 24 hour  Intake 240 ml  Output 1850 ml  Net -1610 ml      04/15/2023    5:13 AM 04/14/2023    2:56 PM 04/13/2023    1:49 PM  Last 3 Weights  Weight (lbs) 348 lb 8.8 oz 345 lb 14.4 oz 356 lb  Weight (kg) 158.1 kg 156.9 kg 161.481 kg      Telemetry    Sinus tachycardia- Personally Reviewed  ECG    No new - Personally Reviewed  Physical Exam   Vitals:   04/16/23 0627 04/16/23 0846  BP: 118/80 106/77  Pulse:  100  Resp:  18  Temp:  98.5 F (36.9 C)  SpO2: (!) 88% 92%    GEN: No acute distress.   Neck: No JVD Cardiac: RRR, no murmurs, rubs, or gallops.  Respiratory: Clear to auscultation bilaterally. GI: Soft, nontender, non-distended  MS: No edema; No deformity. Neuro:  Nonfocal  Psych: Normal affect   Labs    High Sensitivity Troponin:   Recent Labs  Lab 03/30/23 2115 03/30/23 2310 04/13/23 1647 04/13/23 1959  TROPONINIHS 20* 21* 23* 20*     Chemistry Recent Labs  Lab 04/13/23 1647  04/14/23 0232 04/15/23 0326 04/16/23 0250  NA 137 136 135 135  K 5.7* 4.3 4.0 4.9  CL 98 92* 91* 94*  CO2 29 33* 33* 29  GLUCOSE 85 120* 128* 108*  BUN 7 7 11 14   CREATININE 0.73 0.73 0.75 0.77  CALCIUM 9.0 9.0 9.0 9.0  PROT 7.1  --   --   --   ALBUMIN 3.3*  --   --   --   AST 28  --   --   --   ALT 18  --   --   --   ALKPHOS 106  --   --   --   BILITOT 1.4*  --   --   --   GFRNONAA >60 >60 >60 >60  ANIONGAP 10 11 11 12     Lipids No results for input(s): "CHOL", "TRIG", "HDL", "LABVLDL", "LDLCALC", "CHOLHDL" in the last 168 hours.  Hematology Recent Labs  Lab 04/13/23 1647 04/14/23 0232 04/15/23 0326  WBC 14.6* 15.7* 14.2*  RBC 6.18* 6.30* 6.26*  HGB 14.2 14.5 14.5  HCT 45.5 46.1* 45.0  MCV 73.6* 73.2* 71.9*  MCH 23.0* 23.0*  23.2*  MCHC 31.2 31.5 32.2  RDW 20.4* 20.3* 20.2*  PLT 398 433* 404*   Thyroid No results for input(s): "TSH", "FREET4" in the last 168 hours.  BNP Recent Labs  Lab 04/13/23 1647  BNP 226.2*    DDimer  Recent Labs  Lab 04/13/23 1647  DDIMER 0.92*     Radiology    No results found.  Cardiac Studies   TTE 01/27/2023  1. Left ventricular ejection fraction, by estimation, is 45 to 50%. The  left ventricle has mildly decreased function. The left ventricle  demonstrates global hypokinesis. Left ventricular diastolic parameters  were normal.   2. Right ventricular systolic function is mildly reduced. The right  ventricular size is normal. Tricuspid regurgitation signal is inadequate  for assessing PA pressure.   3. The mitral valve is normal in structure. No evidence of mitral valve  regurgitation. No evidence of mitral stenosis.   4. The aortic valve is normal in structure. Aortic valve regurgitation is  not visualized. No aortic stenosis is present.   5. The inferior vena cava is normal in size with greater than 50%  respiratory variability, suggesting right atrial pressure of 3 mmHg.   Patient Profile     33 y.o. female  significant morbid obesity and mildly reduced LV function, OSA who is here with acute shortness of breath concerning for OSA/obesity hypoventilation as well as decompensated heart failure  Assessment & Plan    EF is 45 to 50%; HFPEF She had some inferior wall hypokinesis; planning for outpatient stress; however she is quite stable here and does not have other significant risk factors for ischemic disease.  I will go ahead and cancel the stress.  I think the chest pressure that she describes is mainly related to her habitus. -She feels much improved and I do think this is mainly related to her OSA/obesity hypoventilation; when she has her equipment I think she is okay to go home -Can continue 1 more day of 40 IV Lasix and then switch to 40 mg daily tomorrow -Can continue spironolactone 25 mg daily -Continue Jardiance 10 mg daily -She does not have systolic heart failure, does not need a beta-blocker  Sinus tachycardia -In the setting of hypoxia and obesity   HTN -Her blood pressure is well-controlled -Continue antihypertensives per above  DM2 A1c 6.6 -Receiving insulin here -Started Jardiance 10 mg daily  See plan per above.  I think she can be discharged home when she has her CPAP equipment.  Can change to oral diuretics tomorrow.  She has an appointment with me scheduled.  Otherwise cardiology will sign off   Time Spent Directly with Patient:  I have spent a total of 35 minutes with the patient reviewing hospital notes, telemetry, EKGs, labs and examining the patient as well as establishing an assessment and plan that was discussed personally with the patient.     For questions or updates, please contact [facility] Please consult [email or web address] for contact info under        Signed, Maisie Fus, MD  04/16/2023, 9:25 AM

## 2023-04-16 NOTE — Discharge Instructions (Addendum)
Information on my medicine - XARELTO (rivaroxaban)  WHY WAS XARELTO PRESCRIBED FOR YOU? Xarelto was prescribed to treat blood clots that may have been found in the veins of your legs (deep vein thrombosis) or in your lungs (pulmonary embolism) and to reduce the risk of them occurring again.  What do you need to know about Xarelto? The dose is  one 20 mg tablet taken ONCE A DAY with your evening meal.  DO NOT stop taking Xarelto without talking to the health care provider who prescribed the medication.  Refill your prescription for 20 mg tablets before you run out.  After discharge, you should have regular check-up appointments with your healthcare provider that is prescribing your Xarelto.  In the future your dose may need to be changed if your kidney function changes by a significant amount.  What do you do if you miss a dose? If you are taking Xarelto TWICE DAILY and you miss a dose, take it as soon as you remember. You may take two 15 mg tablets (total 30 mg) at the same time then resume your regularly scheduled 15 mg twice daily the next day.  If you are taking Xarelto ONCE DAILY and you miss a dose, take it as soon as you remember on the same day then continue your regularly scheduled once daily regimen the next day. Do not take two doses of Xarelto at the same time.   Important Safety Information Xarelto is a blood thinner medicine that can cause bleeding. You should call your healthcare provider right away if you experience any of the following: ? Bleeding from an injury or your nose that does not stop. ? Unusual colored urine (red or dark brown) or unusual colored stools (red or black). ? Unusual bruising for unknown reasons. ? A serious fall or if you hit your head (even if there is no bleeding).  Some medicines may interact with Xarelto and might increase your risk of bleeding while on Xarelto. To help avoid this, consult your healthcare provider or pharmacist prior to  using any new prescription or non-prescription medications, including herbals, vitamins, non-steroidal anti-inflammatory drugs (NSAIDs) and supplements.  This website has more information on Xarelto: VisitDestination.com.br.

## 2023-04-16 NOTE — Progress Notes (Signed)
Message had been sent to triage and office NP to cancel outpatient stress myoview per Dr Carolan Clines request today.

## 2023-04-16 NOTE — Progress Notes (Signed)
PROGRESS NOTE  Christine Singleton  ZOX:[id number] DOB: Jan 01, 1991 DOA: 04/13/2023 PCP: Grayce Sessions, NP   Brief Narrative: Patient is a 33 year old female with history of morbid obesity, PE on Eliquis, OSA not on cpap, on 2 to 3 days of oxygen at night chronically,, HFrEF, hypertension who presented with shortness of breath.  She was recently seen by cardiology 2 weeks ago and was being worked up as outpatient to  determine etiology of heart failure, on oral Lasix at home.  Report of progressive shortness of breath, weight gain. On presentation, she was hemodynamically stable.  Lab work showed elevated BNP, potassium of 5.7, WBC count of 14.6.  CTA of chest did not show any PE but showed pulmonary edema, pulm hypertension.  Chest x-ray cardiomegaly, vascular congestion.  Patient was started on IV Lasix.  Cardiology consulted and following.  Plan for discharge to home with oral Lasix tomorrow  Assessment & Plan:  Principal Problem:   CHF exacerbation (HCC) Active Problems:   Acute on chronic systolic CHF (congestive heart failure), NYHA class 3 (HCC)  Acute on chronic HFrEF: Last echo showed EF of 45 to 50%, no pulmonary hypertension.  On Lasix at home.  Presented with dyspnea, elevated BNP.  Chest imagings suggestive of volume overload.  Started on IV Lasix n. here was plan to get a SPECT/myocardial perfusion imaging as an outpatient.  Further management as per cardiology.  Currently on IV Lasix 40 mg daily.  Also on Jardiance, spironolactone.  Plan to change to 40 mg of Lasix oral tomorrow  History of PE: On xarelto  Leukocytosis: No signs of infectious process.    No signs of pneumonia, no dysuria.  UA not suspicious for UTI.  On reviewing her previous lab, she has chronic leukocytosis.  She can follow-up with hematology as an outpatient.  Elevated troponin: No ischemic symptoms.  Flat trend.  Likely from demand ischemia  Diabetes type 2: Takes Trulicity, metformin, Jardiance at home.   Currently on sliding scale.  Monitor blood sugars  History of OSA/OHS/chronic respiratory insufficiency: Underwent sleep study as per her PCP and was supposed to get CPAP machine but not delivered yet.  She is chronically on 3 L of oxygen for sleep. We consulted TOC for arrangement of BiPAP for discharge.  TOC working on it.  ABG collected on 1/29.  Will follow-up  Morbid obesity: BMI of 65         DVT prophylaxis:Place TED hose Start: 04/13/23 2207 rivaroxaban (XARELTO) tablet 20 mg     Code Status: Full Code  Family Communication: None at bedside  Patient status:Inpatient  Patient is from :home  Anticipated discharge WJ:XBJY  Estimated DC date: Tomorrow   Consultants: Cardiology  Procedures:None  Antimicrobials:  Anti-infectives (From admission, onward)    None       Subjective:  Patient seen and examined at bedside today.  Hemodynamically stable comfortably lying in bed.  On room air.  No significant lower extremity pain.  Denies shortness of breath or cough  Objective: Vitals:   04/16/23 0051 04/16/23 0300 04/16/23 0627 04/16/23 0846  BP: 107/80  118/80 106/77  Pulse: 86   100  Resp: 20   18  Temp: 98.5 F (36.9 C)   98.5 F (36.9 C)  TempSrc: Oral   Oral  SpO2: 99% 99% (!) 88% 92%  Weight:      Height:        Intake/Output Summary (Last 24 hours) at 04/16/2023 1139 Last data filed at  04/15/2023 2300 Gross per 24 hour  Intake 240 ml  Output 1850 ml  Net -1610 ml   Filed Weights   04/14/23 1456 04/15/23 0513  Weight: (!) 156.9 kg (!) 158.1 kg    Examination:   General exam: Overall comfortable, not in distress, morbidly obese HEENT: PERRL Respiratory system: Diminished air sounds bilaterally in bases, no wheezes or crackles  Cardiovascular system: S1 & S2 heard, RRR.  Gastrointestinal system: Abdomen is nondistended, soft and nontender. Central nervous system: Alert and oriented Extremities: No edema, no clubbing ,no cyanosis Skin: No  rashes, no ulcers,no icterus     Data Reviewed: I have personally reviewed following labs and imaging studies  CBC: Recent Labs  Lab 04/13/23 1647 04/14/23 0232 04/15/23 0326  WBC 14.6* 15.7* 14.2*  NEUTROABS 10.0*  --   --   HGB 14.2 14.5 14.5  HCT 45.5 46.1* 45.0  MCV 73.6* 73.2* 71.9*  PLT 398 433* 404*   Basic Metabolic Panel: Recent Labs  Lab 04/13/23 1647 04/14/23 0232 04/15/23 0326 04/16/23 0250  NA 137 136 135 135  K 5.7* 4.3 4.0 4.9  CL 98 92* 91* 94*  CO2 29 33* 33* 29  GLUCOSE 85 120* 128* 108*  BUN 7 7 11 14   CREATININE 0.73 0.73 0.75 0.77  CALCIUM 9.0 9.0 9.0 9.0     Recent Results (from the past 240 hours)  Resp panel by RT-PCR (RSV, Flu A&B, Covid) Anterior Nasal Swab     Status: None   Collection Time: 04/13/23  4:47 PM   Specimen: Anterior Nasal Swab  Result Value Ref Range Status   SARS Coronavirus 2 by RT PCR NEGATIVE NEGATIVE Final   Influenza A by PCR NEGATIVE NEGATIVE Final   Influenza B by PCR NEGATIVE NEGATIVE Final    Comment: (NOTE) The Xpert Xpress SARS-CoV-2/FLU/RSV plus assay is intended as an aid in the diagnosis of influenza from Nasopharyngeal swab specimens and should not be used as a sole basis for treatment. Nasal washings and aspirates are unacceptable for Xpert Xpress SARS-CoV-2/FLU/RSV testing.  Fact Sheet for Patients: BloggerCourse.com  Fact Sheet for Healthcare Providers: SeriousBroker.it  This test is not yet approved or cleared by the [place] FDA and has been authorized for detection and/or diagnosis of SARS-CoV-2 by FDA under an Emergency Use Authorization (EUA). This EUA will remain in effect (meaning this test can be used) for the duration of the COVID-19 declaration under Section 564(b)(1) of the Act, 21 U.S.C. section 360bbb-3(b)(1), unless the authorization is terminated or revoked.     Resp Syncytial Virus by PCR NEGATIVE NEGATIVE Final     Comment: (NOTE) Fact Sheet for Patients: BloggerCourse.com  Fact Sheet for Healthcare Providers: SeriousBroker.it  This test is not yet approved or cleared by the [place] FDA and has been authorized for detection and/or diagnosis of SARS-CoV-2 by FDA under an Emergency Use Authorization (EUA). This EUA will remain in effect (meaning this test can be used) for the duration of the COVID-19 declaration under Section 564(b)(1) of the Act, 21 U.S.C. section 360bbb-3(b)(1), unless the authorization is terminated or revoked.  Performed at [facility], [address] [place], [place] [address]      Radiology Studies: No results found.   Scheduled Meds:  empagliflozin  10 mg Oral Daily   furosemide  40 mg Intravenous Daily   insulin aspart  0-15 Units Subcutaneous TID WC   rivaroxaban  20 mg Oral Daily   spironolactone  25 mg Oral  Daily   Continuous Infusions:   LOS: 3 days   Burnadette Pop, MD [facility] P1/30/2025, 11:39 AM

## 2023-04-16 NOTE — Care Management (Signed)
Due to [employer] has Acute on Chronic respiratory failure with hypoxia and hypercapnia secondary to Obesity Hyperventilation Syndrome , non-invasive ventilation is needed to assist with normalizing carbon dioxide & oxygen levels and to reduce the risk of repeat, acute episodes of respiratory failure resulting in longer inpatient hospital stays with more acute levels of care, such as ICU and mechanical ventilation. This patient would also benefit from mouthpiece ventilation for prn daytime use. She has been hospitalized for Acute on Chronic respiratory failure with hypoxia and hypercapnia secondary to Obesity Hyperventilation Syndrome . BIPAP,BIPAP ST, AVAPS has been considered but has been ruled-out and insufficient. NIV therapy is needed Pt's PC02 was >54 during this hospital stay on 04/15/23. Interruption or failure to provide NIV would quickly lead to exacerbation of the patient's condition, lead to more hospitalizations and likely harm the patient or possibly death. Continued use of the NIV is preferred. Patient is able to maintain airway and clear secretions.

## 2023-04-16 NOTE — Telephone Encounter (Signed)
Patient Product/process development scientist completed.    The patient is insured through [employer].     Ran test claim for Xarelto 20 mg and the current 30 day co-pay is $4.00.   This test claim was processed through [facility]- copay amounts may vary at other pharmacies due to pharmacy/plan contracts, or as the patient moves through the different stages of their insurance plan.     Roland Earl, CPHT Pharmacy Technician III Certified Patient Advocate [facility] Pharm[facility] Patient Advocate Team Direct Number: [phone]  Fax: [phone]

## 2023-04-17 ENCOUNTER — Other Ambulatory Visit (HOSPITAL_COMMUNITY): Payer: Self-pay

## 2023-04-17 ENCOUNTER — Other Ambulatory Visit: Payer: Self-pay

## 2023-04-17 DIAGNOSIS — I5043 Acute on chronic combined systolic (congestive) and diastolic (congestive) heart failure: Secondary | ICD-10-CM | POA: Diagnosis not present

## 2023-04-17 LAB — GLUCOSE, CAPILLARY: Glucose-Capillary: 119 mg/dL — ABNORMAL HIGH (ref 70–99)

## 2023-04-17 MED ORDER — FUROSEMIDE 40 MG PO TABS
40.0000 mg | ORAL_TABLET | Freq: Every day | ORAL | 0 refills | Status: DC
  Filled 2023-04-17: qty 60, 60d supply, fill #0

## 2023-04-17 MED ORDER — RIVAROXABAN 20 MG PO TABS
20.0000 mg | ORAL_TABLET | Freq: Every day | ORAL | 0 refills | Status: DC
  Filled 2023-04-17: qty 30, 30d supply, fill #0
  Filled 2023-05-27: qty 30, 30d supply, fill #1

## 2023-04-17 NOTE — Discharge Summary (Signed)
Physician Discharge Summary  Christine Singleton FAO:[id number] DOB: 1990-12-29 DOA: 04/13/2023  PCP: Grayce Sessions, NP  Admit date: 04/13/2023 Discharge date: 04/17/2023  Admitted From: Home Disposition:  Home  Discharge Condition:Stable CODE STATUS:FULL Diet recommendation: Heart Healthy   Brief/Interim Summary: Patient is a 33 year old female with history of morbid obesity, PE on Eliquis, OSA not on cpap, on 2 to 3 days of oxygen at night chronically,, HFrEF, hypertension who presented with shortness of breath.  She was recently seen by cardiology 2 weeks ago and was being worked up as outpatient to  determine etiology of heart failure, on oral Lasix at home.  Report of progressive shortness of breath, weight gain. On presentation, she was hemodynamically stable.  Lab work showed elevated BNP, potassium of 5.7, WBC count of 14.6.  CTA of chest did not show any PE but showed pulmonary edema, pulm hypertension.  Chest x-ray cardiomegaly, vascular congestion.  Patient was started on IV Lasix.  Cardiology consulted and was following.  She currently appears euvolemic.  Cardiology cleared for discharge.  Medically stable for discharge home today.  She is being arranged for BiPAP at home  Following problems were addressed during the hospitalization:  Acute on chronic HFrEF: Last echo showed EF of 45 to 50%, no pulmonary hypertension.  On Lasix at home.  Presented with dyspnea, elevated BNP.  Chest imagings suggestive of volume overload.  Started on IV Lasix n. here was plan to get a SPECT/myocardial perfusion imaging as an outpatient.  Further management as per cardiology. Given IV Lasix 40 mg daily.  Also on Jardiance, spironolactone.  Changed  to 40 mg  Lasix oral  on dc.  She will be arranged cardiology follow-up   History of PE: Was taking Eliquis before, changed to xarelto   Leukocytosis: No signs of infectious process.    No signs of pneumonia, no dysuria.  UA not suspicious for UTI.  On  reviewing her previous lab, she has chronic leukocytosis.  She can follow-up with hematology as an outpatient.   Elevated troponin: No ischemic symptoms.  Flat trend.  Likely from demand ischemia   Diabetes type 2: Takes Trulicity, metformin, Jardiance at home.   Monitor blood sugars at home   History of OSA/OHS/chronic respiratory insufficiency: Underwent sleep study as per her PCP and was supposed to get CPAP machine but not delivered yet.  She is chronically on 3 L of oxygen for sleep. We consulted TOC for arrangement of BiPAP for discharge.  Bipap being arranged   Morbid obesity: BMI of 65   Discharge Diagnoses:  Principal Problem:   CHF exacerbation (HCC) Active Problems:   Acute on chronic systolic CHF (congestive heart failure), NYHA class 3 (HCC)    Discharge Instructions  Discharge Instructions     Diet - low sodium heart healthy   Complete by: As directed    Discharge instructions   Complete by: As directed    1)Please take your medications as instructed 2)Follow up with cardiology as an outpatient.  You will be called for appointment 3)Follow up with your PCP in a week   Increase activity slowly   Complete by: As directed       Allergies as of 04/17/2023       Reactions   Strawberry (diagnostic) Anaphylaxis        Medication List     STOP taking these medications    Eliquis DVT/PE Starter Pack Generic drug: Apixaban Starter Pack (10mg  and 5mg )  TAKE these medications    Accu-Chek Guide test strip Generic drug: glucose blood Use to check blood sugar once daily.   Accu-Chek Guide w/Device Kit Use to check blood sugar once daily.   Accu-Chek Softclix Lancets lancets Use to check blood sugar once daily.   acetaminophen 325 MG tablet Commonly known as: TYLENOL Take 2 tablets (650 mg total) by mouth every 6 (six) hours as needed for mild pain or moderate pain (or Fever >/= 101).   Blood Pressure Kit Please check blood pressure every  morning and keep a log.   furosemide 40 MG tablet Commonly known as: Lasix Take 1 tablet (40 mg total) by mouth daily. What changed:  medication strength how much to take   guaiFENesin 600 MG 12 hr tablet Commonly known as: MUCINEX Take 600 mg by mouth 2 (two) times daily as needed for cough or to loosen phlegm.   Jardiance 10 MG Tabs tablet Generic drug: empagliflozin Take 1 tablet (10 mg total) by mouth daily.   metFORMIN 500 MG tablet Commonly known as: GLUCOPHAGE Take 1 tablet (500 mg total) by mouth daily with breakfast.   OXYGEN Inhale 2 application  into the lungs at bedtime.   rivaroxaban 20 MG Tabs tablet Commonly known as: XARELTO Take 1 tablet (20 mg total) by mouth daily. Start taking on: April 18, 2023   spironolactone 25 MG tablet Commonly known as: ALDACTONE Take 1 tablet (25 mg total) by mouth daily.   Trulicity 0.75 MG/0.5ML Soaj Generic drug: Dulaglutide Inject 0.75 mg into the skin once a week. What changed: additional instructions        Follow-up Information     Grayce Sessions, NP. Schedule an appointment as soon as possible for a visit in 1 week(s).   Specialty: Internal Medicine Contact information: [address] [place] [address] [phone]                Allergies  Allergen Reactions   Strawberry (Diagnostic) Anaphylaxis    Consultations: Cardiology   Procedures/Studies: CT Angio Chest PE W and/or Wo Contrast Result Date: 04/13/2023 CLINICAL DATA:  Shortness of breath.  CHF.  PE suspected. EXAM: CT ANGIOGRAPHY CHEST WITH CONTRAST TECHNIQUE: Multidetector CT imaging of the chest was performed using the standard protocol during bolus administration of intravenous contrast. Multiplanar CT image reconstructions and MIPs were obtained to evaluate the vascular anatomy. RADIATION DOSE REDUCTION: This exam was performed according to the departmental dose-optimization program which includes automated exposure  control, adjustment of the mA and/or kV according to patient size and/or use of iterative reconstruction technique. CONTRAST:  75mL OMNIPAQUE IOHEXOL 350 MG/ML SOLN COMPARISON:  Same-day radiograph and CTA chest 01/26/2023 FINDINGS: Cardiovascular: Negative for acute pulmonary embolism. The main pulmonary artery is dilated measuring 37 mm in maximum diameter. Cardiomegaly. No pericardial effusion. Normal caliber thoracic aorta. Mediastinum/Nodes: No thoracic adenopathy by size. Trachea and esophagus are unremarkable. Lungs/Pleura: Bilateral patchy ground-glass opacities. No pleural effusion or pneumothorax. Upper Abdomen: No acute abnormality. Musculoskeletal: No acute fracture. Review of the MIP images confirms the above findings. IMPRESSION: 1. Negative for acute pulmonary embolism. 2. Bilateral patchy ground-glass opacities, favor pulmonary edema. 3. Dilated main pulmonary artery, which can be seen in the setting of pulmonary hypertension. Electronically Signed   By: Minerva Fester M.D.   On: 04/13/2023 21:10   DG Chest 2 View Result Date: 04/13/2023 CLINICAL DATA:  Shortness of breath.  CHF. EXAM: CHEST - 2 VIEW COMPARISON:  Radiograph 03/30/2023.  CT 01/26/2023 FINDINGS:  Cardiomegaly is stable. Unchanged mediastinal contours. Vascular congestion without frank pulmonary edema. Chronic elevation of right hemidiaphragm. No significant pleural effusion. No focal airspace disease. No pneumothorax. Stable osseous structures. IMPRESSION: Stable cardiomegaly with vascular congestion. Electronically Signed   By: Narda Rutherford M.D.   On: 04/13/2023 16:58   DG Chest 2 View Result Date: 03/30/2023 CLINICAL DATA:  Shortness of breath on exertion. EXAM: CHEST - 2 VIEW COMPARISON:  January 26, 2023 FINDINGS: The cardiac silhouette is mildly enlarged and unchanged in size. Moderate severity prominence of the pulmonary vasculature is seen without evidence of overt pulmonary edema. There is no evidence of focal  consolidation, pleural effusion or pneumothorax. The visualized skeletal structures are unremarkable. IMPRESSION: Cardiomegaly with moderate severity pulmonary vascular congestion. Electronically Signed   By: Aram Candela M.D.   On: 03/30/2023 21:52      Subjective: Patient seen and examined at bedside today.  Hemodynamically stable.  Very comfortable.  Denies new complaints.  On room air.  Medically stable for discharge to home today.  BiPAP is being arranged for her before she goes home  Discharge Exam: Vitals:   04/17/23 0432 04/17/23 0906  BP: 124/76 103/75  Pulse: 92 99  Resp: 15 16  Temp: (!) 97.2 F (36.2 C) (!) 97.5 F (36.4 C)  SpO2: 94% 90%   Vitals:   04/17/23 0052 04/17/23 0412 04/17/23 0432 04/17/23 0906  BP: 129/82  124/76 103/75  Pulse: 89  92 99  Resp: 15  15 16   Temp: 97.8 F (36.6 C)  (!) 97.2 F (36.2 C) (!) 97.5 F (36.4 C)  TempSrc: Axillary  Axillary Oral  SpO2: 93% 96% 94% 90%  Weight:   (!) 153.8 kg   Height:        General: Pt is alert, awake, not in acute distress, morbidly obese Cardiovascular: RRR, S1/S2 +, no rubs, no gallops Respiratory: CTA bilaterally, no wheezing, no rhonchi Abdominal: Soft, NT, ND, bowel sounds + Extremities: no edema, no cyanosis    The results of significant diagnostics from this hospitalization (including imaging, microbiology, ancillary and laboratory) are listed below for reference.     Microbiology: Recent Results (from the past 240 hours)  Resp panel by RT-PCR (RSV, Flu A&B, Covid) Anterior Nasal Swab     Status: None   Collection Time: 04/13/23  4:47 PM   Specimen: Anterior Nasal Swab  Result Value Ref Range Status   SARS Coronavirus 2 by RT PCR NEGATIVE NEGATIVE Final   Influenza A by PCR NEGATIVE NEGATIVE Final   Influenza B by PCR NEGATIVE NEGATIVE Final    Comment: (NOTE) The Xpert Xpress SARS-CoV-2/FLU/RSV plus assay is intended as an aid in the diagnosis of influenza from Nasopharyngeal swab  specimens and should not be used as a sole basis for treatment. Nasal washings and aspirates are unacceptable for Xpert Xpress SARS-CoV-2/FLU/RSV testing.  Fact Sheet for Patients: BloggerCourse.com  Fact Sheet for Healthcare Providers: SeriousBroker.it  This test is not yet approved or cleared by the [place] FDA and has been authorized for detection and/or diagnosis of SARS-CoV-2 by FDA under an Emergency Use Authorization (EUA). This EUA will remain in effect (meaning this test can be used) for the duration of the COVID-19 declaration under Section 564(b)(1) of the Act, 21 U.S.C. section 360bbb-3(b)(1), unless the authorization is terminated or revoked.     Resp Syncytial Virus by PCR NEGATIVE NEGATIVE Final    Comment: (NOTE) Fact Sheet for Patients: BloggerCourse.com  Fact Sheet for Healthcare Providers:  SeriousBroker.it  This test is not yet approved or cleared by the [place] and has been authorized for detection and/or diagnosis of SARS-CoV-2 by FDA under an Emergency Use Authorization (EUA). This EUA will remain in effect (meaning this test can be used) for the duration of the COVID-19 declaration under Section 564(b)(1) of the Act, 21 U.S.C. section 360bbb-3(b)(1), unless the authorization is terminated or revoked.  Performed at [facility], [address], [place], [place] [address]      Labs: BNP (last 3 results) Recent Labs    01/26/23 1830 03/30/23 2115 04/13/23 1647  BNP 26.5 141.6* 226.2*   Basic Metabolic Panel: Recent Labs  Lab 04/13/23 1647 04/14/23 0232 04/15/23 0326 04/16/23 0250  NA 137 136 135 135  K 5.7* 4.3 4.0 4.9  CL 98 92* 91* 94*  CO2 29 33* 33* 29  GLUCOSE 85 120* 128* 108*  BUN 7 7 11 14   CREATININE 0.73 0.73 0.75 0.77  CALCIUM 9.0 9.0 9.0 9.0   Liver Function Tests: Recent Labs  Lab 04/13/23 1647   AST 28  ALT 18  ALKPHOS 106  BILITOT 1.4*  PROT 7.1  ALBUMIN 3.3*   No results for input(s): "LIPASE", "AMYLASE" in the last 168 hours. No results for input(s): "AMMONIA" in the last 168 hours. CBC: Recent Labs  Lab 04/13/23 1647 04/14/23 0232 04/15/23 0326  WBC 14.6* 15.7* 14.2*  NEUTROABS 10.0*  --   --   HGB 14.2 14.5 14.5  HCT 45.5 46.1* 45.0  MCV 73.6* 73.2* 71.9*  PLT 398 433* 404*   Cardiac Enzymes: No results for input(s): "CKTOTAL", "CKMB", "CKMBINDEX", "TROPONINI" in the last 168 hours. BNP: Invalid input(s): "POCBNP" CBG: Recent Labs  Lab 04/16/23 0630 04/16/23 1141 04/16/23 1620 04/16/23 2121 04/17/23 0600  GLUCAP 107* 97 97 95 119*   D-Dimer No results for input(s): "DDIMER" in the last 72 hours. Hgb A1c No results for input(s): "HGBA1C" in the last 72 hours. Lipid Profile No results for input(s): "CHOL", "HDL", "LDLCALC", "TRIG", "CHOLHDL", "LDLDIRECT" in the last 72 hours. Thyroid function studies No results for input(s): "TSH", "T4TOTAL", "T3FREE", "THYROIDAB" in the last 72 hours.  Invalid input(s): "FREET3" Anemia work up No results for input(s): "VITAMINB12", "FOLATE", "FERRITIN", "TIBC", "IRON", "RETICCTPCT" in the last 72 hours. Urinalysis    Component Value Date/Time   COLORURINE YELLOW 04/15/2023 0850   APPEARANCEUR CLEAR 04/15/2023 0850   LABSPEC 1.008 04/15/2023 0850   PHURINE 8.0 04/15/2023 0850   GLUCOSEU NEGATIVE 04/15/2023 0850   HGBUR NEGATIVE 04/15/2023 0850   BILIRUBINUR NEGATIVE 04/15/2023 0850   KETONESUR NEGATIVE 04/15/2023 0850   PROTEINUR NEGATIVE 04/15/2023 0850   UROBILINOGEN 1.0 11/27/2015 1450   NITRITE NEGATIVE 04/15/2023 0850   LEUKOCYTESUR NEGATIVE 04/15/2023 0850   Sepsis Labs Recent Labs  Lab 04/13/23 1647 04/14/23 0232 04/15/23 0326  WBC 14.6* 15.7* 14.2*   Microbiology Recent Results (from the past 240 hours)  Resp panel by RT-PCR (RSV, Flu A&B, Covid) Anterior Nasal Swab     Status: None    Collection Time: 04/13/23  4:47 PM   Specimen: Anterior Nasal Swab  Result Value Ref Range Status   SARS Coronavirus 2 by RT PCR NEGATIVE NEGATIVE Final   Influenza A by PCR NEGATIVE NEGATIVE Final   Influenza B by PCR NEGATIVE NEGATIVE Final    Comment: (NOTE) The Xpert Xpress SARS-CoV-2/FLU/RSV plus assay is intended as an aid in the diagnosis of influenza from Nasopharyngeal swab specimens and should not be  used as a sole basis for treatment. Nasal washings and aspirates are unacceptable for Xpert Xpress SARS-CoV-2/FLU/RSV testing.  Fact Sheet for Patients: BloggerCourse.com  Fact Sheet for Healthcare Providers: SeriousBroker.it  This test is not yet approved or cleared by the [place] FDA and has been authorized for detection and/or diagnosis of SARS-CoV-2 by FDA under an Emergency Use Authorization (EUA). This EUA will remain in effect (meaning this test can be used) for the duration of the COVID-19 declaration under Section 564(b)(1) of the Act, 21 U.S.C. section 360bbb-3(b)(1), unless the authorization is terminated or revoked.     Resp Syncytial Virus by PCR NEGATIVE NEGATIVE Final    Comment: (NOTE) Fact Sheet for Patients: BloggerCourse.com  Fact Sheet for Healthcare Providers: SeriousBroker.it  This test is not yet approved or cleared by the [place] FDA and has been authorized for detection and/or diagnosis of SARS-CoV-2 by FDA under an Emergency Use Authorization (EUA). This EUA will remain in effect (meaning this test can be used) for the duration of the COVID-19 declaration under Section 564(b)(1) of the Act, 21 U.S.C. section 360bbb-3(b)(1), unless the authorization is terminated or revoked.  Performed at [facility], [address], [place], [place] [address]     Please note: You were cared for by a hospitalist during your hospital stay.  Once you are discharged, your primary care physician will handle any further medical issues. Please note that NO REFILLS for any discharge medications will be authorized once you are discharged, as it is imperative that you return to your primary care physician (or establish a relationship with a primary care physician if you do not have one) for your post hospital discharge needs so that they can reassess your need for medications and monitor your lab values.    Time coordinating discharge: 40 minutes  SIGNED:   Burnadette Pop, MD  [facility] 04/17/2023, 11:03 AM Pager [phone]  If 7PM-7AM, please contact night-coverage [email or web address] Password [email or web address]

## 2023-04-17 NOTE — Progress Notes (Signed)
   04/17/23 0012  BiPAP/CPAP/SIPAP  $ Non-Invasive Home Ventilator  Subsequent  BiPAP/CPAP/SIPAP Pt Type Adult  BiPAP/CPAP/SIPAP DREAMSTATIOND  Mask Type Full face mask  Mask Size Large  IPAP 20 cmH20  EPAP 12 cmH2O  Flow Rate 3 lpm  Patient Home Equipment No  Auto Titrate No  CPAP/SIPAP surface wiped down Yes   Patient home settings are 25/25, adjusted for comfort

## 2023-04-17 NOTE — TOC Transition Note (Signed)
Transition of Care [facility]) - Discharge Note   Patient Details  Name: Arilla Hice MRN: [id number] Date of Birth: 07/29/1990  [facility] CM/SW Contact:  Lockie Pares, RN Phone Number: 04/17/2023, 11:30 AM   Clinical Narrative:     Bipap ordered through [employer], will be delivered to home once approved by insurance for authorization.  No further needs identified, patient will be discharged today  Final next level of care: Home/Self Care Barriers to Discharge: No Barriers Identified   Patient Goals and CMS Choice            Discharge Placement             DC self care to home          Discharge Plan and Services Additional resources added to the After Visit Summary for                  DME Arranged: Bipap DME Agency: [employer] Date DME Agency Contacted: 04/16/23 Time DME Agency Contacted: 1600 Representative spoke with at DME Agency: Orvan July            Social Drivers of Health (SDOH) Interventions SDOH Screenings   Food Insecurity: No Food Insecurity (04/14/2023)  Housing: Low Risk  (04/14/2023)  Transportation Needs: No Transportation Needs (04/14/2023)  Recent Concern: Transportation Needs - Unmet Transportation Needs (01/27/2023)  Utilities: Not At Risk (04/14/2023)  Alcohol Screen: Low Risk  (08/18/2022)  Depression (PHQ2-9): Medium Risk (02/10/2023)  Financial Resource Strain: High Risk (09/08/2022)  Tobacco Use: Low Risk  (04/14/2023)     Readmission Risk Interventions     No data to display

## 2023-04-18 ENCOUNTER — Other Ambulatory Visit (HOSPITAL_COMMUNITY): Payer: Self-pay

## 2023-04-20 ENCOUNTER — Other Ambulatory Visit (HOSPITAL_BASED_OUTPATIENT_CLINIC_OR_DEPARTMENT_OTHER): Payer: Self-pay

## 2023-04-20 ENCOUNTER — Telehealth: Payer: Self-pay | Admitting: *Deleted

## 2023-04-20 ENCOUNTER — Telehealth: Payer: Self-pay

## 2023-04-20 ENCOUNTER — Other Ambulatory Visit (HOSPITAL_COMMUNITY): Payer: Self-pay

## 2023-04-20 NOTE — Transitions of Care (Post Inpatient/ED Visit) (Signed)
   04/20/2023  Name: Christine Singleton MRN: [id number] DOB: 19-Jun-1990  Today's TOC FU Call Status: Today's TOC FU Call Status:: Successful TOC FU Call Completed TOC FU Call Complete Date: 04/20/23 Patient's Name and Date of Birth confirmed.  Transition Care Management Follow-up Telephone Call Date of Discharge: 04/17/23 Discharge Facility: [facility]) Type of Discharge: Inpatient Admission Primary Inpatient Discharge Diagnosis:: CHF exacerbation How have you been since you were released from the hospital?: Better Any questions or concerns?: No  Items Reviewed: Did you receive and understand the discharge instructions provided?: Yes Medications obtained,verified, and reconciled?: No Medications Not Reviewed Reasons:: Other: (She said she has all of her medications and did not have any questions about the med reigme and did not need to review the med list  She said she has a glucometer but does not have a home BP monitor.) Any new allergies since your discharge?: No Dietary orders reviewed?: Yes Type of Diet Ordered:: heart healthy, low sodium Do you have support at home?: Yes People in Home: parent(s) Name of Support/Comfort Primary Source: her mother and grandmother  Medications Reviewed Today: Medications Reviewed Today   Medications were not reviewed in this encounter     Home Care and Equipment/Supplies: Were Home Health Services Ordered?: No Any new equipment or medical supplies ordered?: Yes Name of Medical supply agency?: [employer]- BiPAP.  She already has home O2 that she uses @ 3L/min at night. Were you able to get the equipment/medical supplies?: Yes Do you have any questions related to the use of the equipment/supplies?: No (She said the BiPAP has helped her alot)  Functional Questionnaire: Do you need assistance with bathing/showering or dressing?: No Do you need assistance with meal preparation?: No Do you need assistance with eating?: No Do you have difficulty  maintaining continence: No Do you need assistance with getting out of bed/getting out of a chair/moving?: No Do you have difficulty managing or taking your medications?: No  Follow up appointments reviewed: PCP Follow-up appointment confirmed?: Yes Date of PCP follow-up appointment?: 04/28/23 Follow-up Provider: Efrain Sella, NP Specialist Hospital Follow-up appointment confirmed?: Yes Date of Specialist follow-up appointment?: 05/08/23 Follow-Up Specialty Provider:: cardiology. Do you need transportation to your follow-up appointment?: No Do you understand care options if your condition(s) worsen?: Yes-patient verbalized understanding    SIGNATURE Robyne Peers, RN

## 2023-04-20 NOTE — Transitions of Care (Post Inpatient/ED Visit) (Signed)
04/20/2023  Name: Christine Singleton MRN: [id number] DOB: [birth year]  Today's TOC FU Call Status: Today's TOC FU Call Status:: Successful TOC FU Call Completed TOC FU Call Complete Date: 04/20/23 Patient's Name and Date of Birth confirmed.  Transition Care Management Follow-up Telephone Call Date of Discharge: 04/17/23 Discharge Facility: [facility]) Type of Discharge: Inpatient Admission Primary Inpatient Discharge Diagnosis:: CHF exacerbation How have you been since you were released from the hospital?: Better Any questions or concerns?: No  Items Reviewed: Did you receive and understand the discharge instructions provided?: Yes Medications obtained,verified, and reconciled?: Yes (Medications Reviewed) Any new allergies since your discharge?: No Dietary orders reviewed?: Yes Type of Diet Ordered:: Low sodium heart healthy Do you have support at home?: Yes People in Home: parent(s) Name of Support/Comfort Primary Source: [place]  Medications Reviewed Today: Medications Reviewed Today     Reviewed by Luella Cook, RN (Case Manager) on 04/20/23 at 1623  Med List Status: <None>   Medication Order Taking? Sig Documenting Provider Last Dose Status Informant  Accu-Chek Softclix Lancets lancets [id number] Yes Use to check blood sugar once daily. Hoy Register, MD Taking Active Self, Pharmacy Records  acetaminophen (TYLENOL) 325 MG tablet [id number] Yes Take 2 tablets (650 mg total) by mouth every 6 (six) hours as needed for mild pain or moderate pain (or Fever >/= 101). Arrien, York Ram, MD Taking Active Self, Pharmacy Records  Blood Glucose Monitoring Suppl (ACCU-CHEK GUIDE) w/Device KIT [id number] Yes Use to check blood sugar once daily. Hoy Register, MD Taking Active Self, Pharmacy Records  Blood Pressure KIT [id number] No Please check blood pressure every morning and keep a log.  Patient not taking: Reported on 04/20/2023   Arrien, York Ram, MD Not Taking  Active Self, Pharmacy Records           Med Note Orson Aloe Apr 20, 2023  1:03 PM) She does not have this  Dulaglutide (TRULICITY) 0.75 MG/0.5ML Ivory Broad [id number] Yes Inject 0.75 mg into the skin once a week.  Patient taking differently: Inject 0.75 mg into the skin once a week. Monday   Hoy Register, MD Taking Active Self, Pharmacy Records  empagliflozin (JARDIANCE) 10 MG TABS tablet [id number] Yes Take 1 tablet (10 mg total) by mouth daily. Andrey Farmer, PA-C Taking Active Self, Pharmacy Records  furosemide (LASIX) 40 MG tablet [id number] Yes Take 1 tablet (40 mg total) by mouth daily. Burnadette Pop, MD Taking Active   glucose blood (ACCU-CHEK GUIDE) test strip [id number] Yes Use to check blood sugar once daily. Hoy Register, MD Taking Active Self, Pharmacy Records  guaiFENesin (MUCINEX) 600 MG 12 hr tablet [id number] Yes Take 600 mg by mouth 2 (two) times daily as needed for cough or to loosen phlegm. [provider] Taking Active Self, Pharmacy Records  metFORMIN (GLUCOPHAGE) 500 MG tablet [id number] Yes Take 1 tablet (500 mg total) by mouth daily with breakfast. Grayce Sessions, NP Taking Active Self, Pharmacy Records  OXYGEN [id number] Yes Inhale 2 application  into the lungs at bedtime. [provider] Taking Active Self, Pharmacy Records  rivaroxaban (XARELTO) 20 MG TABS tablet [id number] Yes Take 1 tablet (20 mg total) by mouth daily. Burnadette Pop, MD Taking Active   spironolactone (ALDACTONE) 25 MG tablet [id number] Yes Take 1 tablet (25 mg total) by mouth daily. Andrey Farmer, PA-C Taking Active Self, Pharmacy Records            Home Care and Equipment/Supplies: Were Home Health Services  Ordered?: NA Any new equipment or medical supplies ordered?: Yes Name of Medical supply agency?: [employer]) Were you able to get the equipment/medical supplies?: Yes Do you have any questions related to the use of the equipment/supplies?:  No  Functional Questionnaire: Do you need assistance with bathing/showering or dressing?: No Do you need assistance with meal preparation?: No Do you need assistance with eating?: No Do you have difficulty maintaining continence: No Do you need assistance with getting out of bed/getting out of a chair/moving?: No  Follow up appointments reviewed: PCP Follow-up appointment confirmed?: Yes Date of PCP follow-up appointment?: 04/28/23 Follow-up Provider: Gwinda Passe Specialist [facility] Follow-up appointment confirmed?: Yes Date of Specialist follow-up appointment?: 05/08/23 Follow-Up Specialty Provider:: Carolan Clines Do you need transportation to your follow-up appointment?: No Do you understand care options if your condition(s) worsen?: Yes-patient verbalized understanding  SDOH Interventions Today    Flowsheet Row Most Recent Value  SDOH Interventions   Food Insecurity Interventions Intervention Not Indicated  Housing Interventions Intervention Not Indicated  Transportation Interventions Intervention Not Indicated, Patient Resources (Friends/Family)      Interventions Today    Flowsheet Row Most Recent Value  Chronic Disease   Chronic disease during today's visit Congestive Heart Failure (CHF)  General Interventions   General Interventions Discussed/Reviewed General Interventions Discussed, General Interventions Reviewed, Doctor Visits  Doctor Visits Discussed/Reviewed Doctor Visits Discussed, Doctor Visits Reviewed  Education Interventions   Education Provided Provided Printed Education, Provided Web-based Education  Pharmacy Interventions   Pharmacy Dicussed/Reviewed Pharmacy Topics Discussed, Pharmacy Topics Reviewed        Gean Maidens BSN RN [facility] Care Management Coordinator [email or web address] Direct Dial: [phone]  Fax: [phone] Website: [email or web address]

## 2023-04-21 ENCOUNTER — Other Ambulatory Visit (HOSPITAL_COMMUNITY): Payer: Self-pay

## 2023-04-22 ENCOUNTER — Other Ambulatory Visit (HOSPITAL_BASED_OUTPATIENT_CLINIC_OR_DEPARTMENT_OTHER): Payer: Self-pay

## 2023-04-23 ENCOUNTER — Ambulatory Visit (HOSPITAL_COMMUNITY): Payer: Medicaid Other

## 2023-04-24 ENCOUNTER — Ambulatory Visit (HOSPITAL_COMMUNITY): Payer: Medicaid Other

## 2023-04-28 ENCOUNTER — Inpatient Hospital Stay (INDEPENDENT_AMBULATORY_CARE_PROVIDER_SITE_OTHER): Payer: Medicaid Other | Admitting: Primary Care

## 2023-05-04 ENCOUNTER — Encounter (INDEPENDENT_AMBULATORY_CARE_PROVIDER_SITE_OTHER): Payer: Self-pay

## 2023-05-04 ENCOUNTER — Inpatient Hospital Stay (INDEPENDENT_AMBULATORY_CARE_PROVIDER_SITE_OTHER): Payer: Medicaid Other | Admitting: Primary Care

## 2023-05-08 ENCOUNTER — Telehealth: Payer: Self-pay

## 2023-05-08 ENCOUNTER — Ambulatory Visit: Payer: Medicaid Other | Attending: Internal Medicine | Admitting: Internal Medicine

## 2023-05-08 VITALS — Wt 341.2 lb

## 2023-05-08 DIAGNOSIS — I5022 Chronic systolic (congestive) heart failure: Secondary | ICD-10-CM

## 2023-05-08 NOTE — Telephone Encounter (Signed)
  Patient Consent for Virtual Visit        Christine Singleton has provided verbal consent on 05/08/2023 for a virtual visit (video or telephone).   CONSENT FOR VIRTUAL VISIT FOR:  Christine Singleton  By participating in this virtual visit I agree to the following:  I hereby voluntarily request, consent and authorize [facility] and its employed or contracted physicians, physician assistants, nurse practitioners or other licensed health care professionals (the Practitioner), to provide me with telemedicine health care services (the "Services") as deemed necessary by the treating Practitioner. I acknowledge and consent to receive the Services by the Practitioner via telemedicine. I understand that the telemedicine visit will involve communicating with the Practitioner through live audiovisual communication technology and the disclosure of certain medical information by electronic transmission. I acknowledge that I have been given the opportunity to request an in-person assessment or other available alternative prior to the telemedicine visit and am voluntarily participating in the telemedicine visit.  I understand that I have the right to withhold or withdraw my consent to the use of telemedicine in the course of my care at any time, without affecting my right to future care or treatment, and that the Practitioner or I may terminate the telemedicine visit at any time. I understand that I have the right to inspect all information obtained and/or recorded in the course of the telemedicine visit and may receive copies of available information for a reasonable fee.  I understand that some of the potential risks of receiving the Services via telemedicine include:  Delay or interruption in medical evaluation due to technological equipment failure or disruption; Information transmitted may not be sufficient (e.g. poor resolution of images) to allow for appropriate medical decision making by the Practitioner;  and/or  In rare instances, security protocols could fail, causing a breach of personal health information.  Furthermore, I acknowledge that it is my responsibility to provide information about my medical history, conditions and care that is complete and accurate to the best of my ability. I acknowledge that Practitioner's advice, recommendations, and/or decision may be based on factors not within their control, such as incomplete or inaccurate data provided by me or distortions of diagnostic images or specimens that may result from electronic transmissions. I understand that the practice of medicine is not an exact science and that Practitioner makes no warranties or guarantees regarding treatment outcomes. I acknowledge that a copy of this consent can be made available to me via my patient portal [facility] MyChart), or I can request a printed copy by calling the office of  [facility].    I understand that my insurance will be billed for this visit.   I have read or had this consent read to me. I understand the contents of this consent, which adequately explains the benefits and risks of the Services being provided via telemedicine.  I have been provided ample opportunity to ask questions regarding this consent and the Services and have had my questions answered to my satisfaction. I give my informed consent for the services to be provided through the use of telemedicine in my medical care

## 2023-05-08 NOTE — Patient Instructions (Signed)
 Medication Instructions:  Your physician recommends that you continue on your current medications as directed. Please refer to the Current Medication list given to you today.    *If you need a refill on your cardiac medications before your next appointment, please call your pharmacy*   Lab Work: None    If you have labs (blood work) drawn today and your tests are completely normal, you will receive your results only by: MyChart Message (if you have MyChart) OR A paper copy in the mail If you have any lab test that is abnormal or we need to change your treatment, we will call you to review the results.   Testing/Procedures:  NONE   Follow-Up: At [facility], you and your health needs are our priority.  As part of our continuing mission to provide you with exceptional heart care, we have created designated Provider Care Teams.  These Care Teams include your primary Cardiologist (physician) and Advanced Practice Providers (APPs -  Physician Assistants and Nurse Practitioners) who all work together to provide you with the care you need, when you need it.  We recommend signing up for the patient portal called "MyChart".  Sign up information is provided on this After Visit Summary.  MyChart is used to connect with patients for Virtual Visits (Telemedicine).  Patients are able to view lab/test results, encounter notes, upcoming appointments, etc.  Non-urgent messages can be sent to your provider as well.   To learn more about what you can do with MyChart, go to [email or web address].    Your next appointment:   6 month(s)  The format for your next appointment:   In Person  Provider:   Edd Fabian, FNP, Micah Flesher, PA-C, Marjie Skiff, PA-C, Robet Leu, PA-C, Juanda Crumble, PA-C, Joni Reining, DNP, ANP, Azalee Course, PA-C, Bernadene Person, NP, or Reather Littler, NP       Other Instructions

## 2023-05-08 NOTE — Progress Notes (Addendum)
 Cardiology Office Note:  .   Date:  05/08/2023  ID:  Christine Singleton, DOB 1990-03-27, MRN [id number] PCP: Grayce Sessions, NP  [facility] Providers Cardiologist:  Maisie Fus, MD    History of Present Illness: .   Christine Singleton is a 33 y.o. female w/ hx of obesity BMI 62, who is referred to cardiology for reduced EF estimated to be 45 to 50%.  She had normal RV function.  No pulmonary hypertension.  She was diagnosed with a PE during this time in November 2024. She is on eliquis. She has obstructive sleep apnea on BIPAP. She had hx of HTN. She went to the emergency room yesterday with shortness of breath.  She comes in today for follow-up of mildly reduced EF.  Her EF is closer to 50%.  Think 45% is an underestimation.  She does have hypokinesis of the inferior/inferior septal wall.  This was identified in May and she has not had an ischemic evaluation.  She went to the emergency room on 03/30/2023.  She was reporting shortness of breath which is worse with exertion.  She was noted to be on chronic Lasix. Her BNP was 141; it was elevated to 407 months ago.  Today she comes in for follow-up her prior EKG showed sinus tachycardia.  No family hx  ROS:  per HPI otherwise negative  Interim hx 05/08/2023 Virtual Visit. Camera did not work. She was seen in the hospital in late January of 2025 with decompensated CHF and challenges with OSA.  She feels a lot better now. She has her CPAP machine. She denies dyspnea. She has no AM headaches.  She lost 6 pounds!   Studies Reviewed: .         Risk Assessment/Calculations:        Physical Exam:   VS:  NA  Wt Readings from Last 3 Encounters:  04/17/23 (!) 339 lb 1.1 oz (153.8 kg)  04/13/23 (!) 356 lb (161.5 kg)  04/10/23 (!) 352 lb 3.2 oz (159.8 kg)    NA  ASSESSMENT AND PLAN: .   Mildy reduced EF. EF ~50 Diastolic CHF Morbid Obesity Initially saw with SOB. She was seen in the hospital in late January of 2025 with  decompensated CHF. Clinically it is challenging to discern. BNP 226. She was diuresed. She also has sleep apnea and did not have a CPAP/O2 at home.  Considered SPECT, however mild decline in EF , likely not ischemic.  - continue jardiance 10 mg daily -continue lasix 40 mg daily -continue spironolactone 25 mg daily  DM2: well controlled. A1c 6.6. On SGLT2 and metformin.   OSA CPAP  HTN - continue current regimen  Hx of PE 01/2023 On xarelto     Obesity  -tried ozempic. Encouraging weight loss     Dispo: Follow up in 6 months  Virtual Visit via Telephone Note  I connected with Christine Singleton on 05/25/23 at 11:40 AM EST by telephone and verified that I am speaking with the correct person using two identifiers.  Location: Provider Location: Home Patient Location: Home   I discussed the limitations, risks, security and privacy concerns of performing an evaluation and management service by telephone and the availability of in person appointments. I also discussed with the patient that there may be a patient responsible charge related to this service. The patient expressed understanding and agreed to proceed.    I discussed the assessment and treatment plan with the patient. The patient was provided  an opportunity to ask questions and all were answered. The patient agreed with the plan and demonstrated an understanding of the instructions.   The patient was advised to call back or seek an in-person evaluation if the symptoms worsen or if the condition fails to improve as anticipated.  I provided 15 minutes of non-face-to-face time during this encounter.   Maisie Fus, MD    Signed, Maisie Fus, MD

## 2023-05-11 ENCOUNTER — Inpatient Hospital Stay (INDEPENDENT_AMBULATORY_CARE_PROVIDER_SITE_OTHER): Payer: Medicaid Other | Admitting: Primary Care

## 2023-05-11 ENCOUNTER — Telehealth (INDEPENDENT_AMBULATORY_CARE_PROVIDER_SITE_OTHER): Payer: Medicaid Other | Admitting: Primary Care

## 2023-05-27 ENCOUNTER — Other Ambulatory Visit (HOSPITAL_BASED_OUTPATIENT_CLINIC_OR_DEPARTMENT_OTHER): Payer: Self-pay

## 2023-05-27 ENCOUNTER — Other Ambulatory Visit (INDEPENDENT_AMBULATORY_CARE_PROVIDER_SITE_OTHER): Payer: Self-pay | Admitting: Primary Care

## 2023-05-28 ENCOUNTER — Other Ambulatory Visit (HOSPITAL_BASED_OUTPATIENT_CLINIC_OR_DEPARTMENT_OTHER): Payer: Self-pay

## 2023-05-28 NOTE — Telephone Encounter (Signed)
 Will forward to provider

## 2023-05-31 MED ORDER — METFORMIN HCL 500 MG PO TABS
500.0000 mg | ORAL_TABLET | Freq: Every day | ORAL | 1 refills | Status: DC
  Filled 2023-05-31 – 2023-06-24 (×2): qty 90, 90d supply, fill #0
  Filled 2023-10-07 – 2023-10-26 (×2): qty 90, 90d supply, fill #1

## 2023-06-01 ENCOUNTER — Ambulatory Visit (INDEPENDENT_AMBULATORY_CARE_PROVIDER_SITE_OTHER): Payer: Self-pay | Admitting: Primary Care

## 2023-06-01 ENCOUNTER — Other Ambulatory Visit (HOSPITAL_BASED_OUTPATIENT_CLINIC_OR_DEPARTMENT_OTHER): Payer: Self-pay

## 2023-06-02 ENCOUNTER — Other Ambulatory Visit (HOSPITAL_COMMUNITY): Payer: Self-pay

## 2023-06-10 ENCOUNTER — Ambulatory Visit (INDEPENDENT_AMBULATORY_CARE_PROVIDER_SITE_OTHER): Admitting: Primary Care

## 2023-06-12 ENCOUNTER — Other Ambulatory Visit (HOSPITAL_BASED_OUTPATIENT_CLINIC_OR_DEPARTMENT_OTHER): Payer: Self-pay

## 2023-06-23 ENCOUNTER — Ambulatory Visit (INDEPENDENT_AMBULATORY_CARE_PROVIDER_SITE_OTHER): Payer: Self-pay | Admitting: Primary Care

## 2023-06-24 ENCOUNTER — Other Ambulatory Visit (HOSPITAL_BASED_OUTPATIENT_CLINIC_OR_DEPARTMENT_OTHER): Payer: Self-pay

## 2023-06-24 ENCOUNTER — Other Ambulatory Visit: Payer: Self-pay

## 2023-06-24 ENCOUNTER — Telehealth (INDEPENDENT_AMBULATORY_CARE_PROVIDER_SITE_OTHER): Payer: Self-pay | Admitting: Primary Care

## 2023-06-24 NOTE — Telephone Encounter (Signed)
 Copied from CRM [id number]. Topic: General - Other >> Jun 24, 2023  8:28 AM Arley Phenix D wrote: Reason for CRM: Patient stated that she is listed as a no show for her appointment that was on yesterday 4/8. Patient stated that she joined the video appt at 3:15 and at 3:35 she received a pop up that stated " the provider will be with you shortly". Patient stated that she waited a little longer and Dr.Edwards never joined the video appointment.

## 2023-06-25 ENCOUNTER — Other Ambulatory Visit: Payer: Self-pay | Admitting: Family Medicine

## 2023-06-25 ENCOUNTER — Other Ambulatory Visit (HOSPITAL_BASED_OUTPATIENT_CLINIC_OR_DEPARTMENT_OTHER): Payer: Self-pay

## 2023-06-25 MED ORDER — TRULICITY 0.75 MG/0.5ML ~~LOC~~ SOAJ
0.7500 mg | SUBCUTANEOUS | 1 refills | Status: DC
  Filled 2023-06-25: qty 2, 28d supply, fill #0

## 2023-06-26 ENCOUNTER — Telehealth (INDEPENDENT_AMBULATORY_CARE_PROVIDER_SITE_OTHER): Payer: Self-pay | Admitting: Primary Care

## 2023-06-26 NOTE — Telephone Encounter (Signed)
 Copied from CRM [id number]. Topic: Clinical - Medication Question >> Jun 26, 2023  1:06 PM Abundio Miu S wrote: Reason for CRM: Patient states that she was reviewing her chart and sees that London Pepper is listed as one of her current medications. Patient states that she has not taken this medication in a while and does not have any refills on this medication. She is wanting to know if she should or should not be taking this medication. Please contact patient with instructions. Callback # [phone]

## 2023-07-06 ENCOUNTER — Encounter (HOSPITAL_BASED_OUTPATIENT_CLINIC_OR_DEPARTMENT_OTHER): Payer: Self-pay

## 2023-07-06 ENCOUNTER — Emergency Department (HOSPITAL_BASED_OUTPATIENT_CLINIC_OR_DEPARTMENT_OTHER)

## 2023-07-06 ENCOUNTER — Other Ambulatory Visit: Payer: Self-pay

## 2023-07-06 ENCOUNTER — Observation Stay (HOSPITAL_BASED_OUTPATIENT_CLINIC_OR_DEPARTMENT_OTHER)
Admission: EM | Admit: 2023-07-06 | Discharge: 2023-07-08 | Disposition: A | Discharge status: Home / Self Care | Attending: Internal Medicine | Admitting: Internal Medicine

## 2023-07-06 ENCOUNTER — Ambulatory Visit: Payer: Self-pay

## 2023-07-06 DIAGNOSIS — E8770 Fluid overload, unspecified: Secondary | ICD-10-CM

## 2023-07-06 DIAGNOSIS — Z7984 Long term (current) use of oral hypoglycemic drugs: Secondary | ICD-10-CM | POA: Insufficient documentation

## 2023-07-06 DIAGNOSIS — Z79899 Other long term (current) drug therapy: Secondary | ICD-10-CM | POA: Insufficient documentation

## 2023-07-06 DIAGNOSIS — R0602 Shortness of breath: Principal | ICD-10-CM

## 2023-07-06 DIAGNOSIS — Z86711 Personal history of pulmonary embolism: Secondary | ICD-10-CM | POA: Diagnosis not present

## 2023-07-06 DIAGNOSIS — I5043 Acute on chronic combined systolic (congestive) and diastolic (congestive) heart failure: Secondary | ICD-10-CM

## 2023-07-06 DIAGNOSIS — I5033 Acute on chronic diastolic (congestive) heart failure: Secondary | ICD-10-CM | POA: Diagnosis not present

## 2023-07-06 DIAGNOSIS — E119 Type 2 diabetes mellitus without complications: Secondary | ICD-10-CM | POA: Insufficient documentation

## 2023-07-06 DIAGNOSIS — Z7985 Long-term (current) use of injectable non-insulin antidiabetic drugs: Secondary | ICD-10-CM | POA: Diagnosis not present

## 2023-07-06 DIAGNOSIS — R079 Chest pain, unspecified: Secondary | ICD-10-CM | POA: Diagnosis not present

## 2023-07-06 DIAGNOSIS — Z7901 Long term (current) use of anticoagulants: Secondary | ICD-10-CM | POA: Diagnosis not present

## 2023-07-06 DIAGNOSIS — I110 Hypertensive heart disease with heart failure: Secondary | ICD-10-CM | POA: Insufficient documentation

## 2023-07-06 DIAGNOSIS — I5023 Acute on chronic systolic (congestive) heart failure: Secondary | ICD-10-CM

## 2023-07-06 DIAGNOSIS — R0902 Hypoxemia: Secondary | ICD-10-CM

## 2023-07-06 DIAGNOSIS — J9601 Acute respiratory failure with hypoxia: Secondary | ICD-10-CM | POA: Insufficient documentation

## 2023-07-06 HISTORY — DX: Sleep apnea, unspecified: G47.30

## 2023-07-06 HISTORY — DX: Essential (primary) hypertension: I10

## 2023-07-06 HISTORY — DX: Heart failure, unspecified: I50.9

## 2023-07-06 HISTORY — DX: Other pulmonary embolism without acute cor pulmonale: I26.99

## 2023-07-06 LAB — CBC
HCT: 43.4 % (ref 36.0–46.0)
Hemoglobin: 14.2 g/dL (ref 12.0–15.0)
MCH: 23.3 pg — ABNORMAL LOW (ref 26.0–34.0)
MCHC: 32.7 g/dL (ref 30.0–36.0)
MCV: 71.3 fL — ABNORMAL LOW (ref 80.0–100.0)
Platelets: 406 10*3/uL — ABNORMAL HIGH (ref 150–400)
RBC: 6.09 MIL/uL — ABNORMAL HIGH (ref 3.87–5.11)
RDW: 21.9 % — ABNORMAL HIGH (ref 11.5–15.5)
WBC: 14.4 10*3/uL — ABNORMAL HIGH (ref 4.0–10.5)
nRBC: 0 % (ref 0.0–0.2)

## 2023-07-06 LAB — BASIC METABOLIC PANEL WITH GFR
Anion gap: 7 (ref 5–15)
BUN: 11 mg/dL (ref 6–20)
CO2: 28 mmol/L (ref 22–32)
Calcium: 8.8 mg/dL — ABNORMAL LOW (ref 8.9–10.3)
Chloride: 103 mmol/L (ref 98–111)
Creatinine, Ser: 0.7 mg/dL (ref 0.44–1.00)
GFR, Estimated: >60 mL/min (ref >60)
Glucose, Bld: 109 mg/dL — ABNORMAL HIGH (ref 70–99)
Potassium: 4.3 mmol/L (ref 3.5–5.1)
Sodium: 138 mmol/L (ref 135–145)

## 2023-07-06 LAB — TROPONIN I (HIGH SENSITIVITY)
Troponin I (High Sensitivity): 10 ng/L (ref <18)
Troponin I (High Sensitivity): 9 ng/L (ref <18)

## 2023-07-06 LAB — BRAIN NATRIURETIC PEPTIDE: B Natriuretic Peptide: 78.8 pg/mL (ref 0.0–100.0)

## 2023-07-06 MED ORDER — ALBUTEROL SULFATE HFA 108 (90 BASE) MCG/ACT IN AERS: 2.0000 | INHALATION_SPRAY | RESPIRATORY_TRACT | Status: DC | PRN

## 2023-07-06 MED ORDER — FUROSEMIDE 10 MG/ML IJ SOLN
40.0000 mg | Freq: Once | INTRAMUSCULAR | Status: AC
  Administered 2023-07-06: 40 mg via INTRAVENOUS
  Filled 2023-07-06: qty 4

## 2023-07-06 NOTE — Telephone Encounter (Signed)
 Copied from CRM [id number]. Topic: Clinical - Red Word Triage >> Jul 06, 2023  3:14 PM Baldemar Lev wrote: Red Word that prompted transfer to Nurse Triage: Pt is experiencing swelling, she says her breathing is heavier and believes this is related to her medicine.   Chief Complaint: Leg swelling Symptoms: Leg swelling, difficulty breathing  Frequency: Constant  Disposition: [x] ED /[] Urgent Care (no appt availability in office) / [] Appointment(In office/virtual)/ []  [place] [facility]/ [] Home Care/ [] Refused Recommended Disposition /[] [place] [facility]/ []  Follow-up with PCP Additional Notes: Patient reports that yesterday she was on her feet a lot and that since that time she has been experiencing swelling in her legs. She states that she is unsure how high the swelling goes, but states that she is experiencing difficulty breathing with it. Patient advised that with her symptoms she should go to the ED for evaluation and treatment. Patient verbalized understanding and agreement with this plan.     Reason for Disposition  [1] Difficulty breathing with exertion (e.g., walking) AND [2] new-onset or worsening  Answer Assessment - Initial Assessment Questions 1. ONSET: "When did the swelling start?" (e.g., minutes, hours, days)     Yesterday  2. LOCATION: "What part of the leg is swollen?"  "Are both legs swollen or just one leg?"     Bilateral legs  3. SEVERITY: "How bad is the swelling?" (e.g., localized; mild, moderate, severe)   - Localized: Small area of swelling localized to one leg.   - MILD pedal edema: Swelling limited to foot and ankle, pitting edema < 1/4 inch (6 mm) deep, rest and elevation eliminate most or all swelling.   - MODERATE edema: Swelling of lower leg to knee, pitting edema > 1/4 inch (6 mm) deep, rest and elevation only partially reduce swelling.   - SEVERE edema: Swelling extends above knee, facial or hand swelling present.      Moderate  6. FEVER: "Do you have a  fever?" If Yes, ask: "What is it, how was it measured, and when did it start?"      No 7. CAUSE: "What do you think is causing the leg swelling?"     Fluid retention  8. MEDICAL HISTORY: "Do you have a history of blood clots (e.g., DVT), cancer, heart failure, kidney disease, or liver failure?"     CHF 9. RECURRENT SYMPTOM: "Have you had leg swelling before?" If Yes, ask: "When was the last time?" "What happened that time?"     Yes 10. OTHER SYMPTOMS: "Do you have any other symptoms?" (e.g., chest pain, difficulty breathing)       Difficulty breathing  Protocols used: Leg Swelling and Edema-A-AH

## 2023-07-06 NOTE — ED Notes (Signed)
 Pt. Reports she has been feeling shortness of breath building up for a week.  Pt. Feels like she has drinking more water than normal and has put on xtra fluid.    Pt. Is short of breath just lying still.

## 2023-07-06 NOTE — ED Notes (Signed)
 Ambulated patient  via order. Resting HR 109/ oxygen saturation 92%. Patient ambulated with a slow gait with patient showing signs of SOB. Patient HR increased to 144/ oxygen saturation of 94%, RR 22,  with patient complaining to SOB.Patient returned to room and placed back on monitor. Oxygen saturation of 82% on room air. Patient placed on 3L [place] and oxygen saturation increased to 95%. Physician notified.

## 2023-07-06 NOTE — ED Notes (Signed)
 Seen before triage, SpO2 98% on room air,  HR 107,  RR 20 shallow.  On cell phone without increased WOB.

## 2023-07-06 NOTE — ED Notes (Signed)
 Patient denies pain and is resting comfortably.

## 2023-07-06 NOTE — Progress Notes (Signed)
   Patient Name: Christine Singleton, Christine Singleton DOB: Oct 08, 1990 MRN: [id number] Transferring facility: [facility] Requesting provider: Tegeler, MD Reason for transfer: acute on chronic diastolic CHF exacerbation 33 yo AAF hx of super morbid obesity BMI 61, diastolic CHF, DM2, presents to ER with SOB. desats to 81% on RA with ambulation. uses 3 L/min nocturnal O2. hx of PE on eliquis . uses bipap at night. Going to: [facility] Admission Status: med/tele Bed Type: observation To Do:  [facility] will assume care on arrival to accepting facility. Until arrival, medical decision making responsibilities remain with the EDP.  However, [facility] available 24/7 for questions and assistance.   Nursing staff please page [facility] Admits and Consults [phone]) as soon as the patient arrives to the hospital.  Unk Garb, DO [facility]

## 2023-07-06 NOTE — ED Triage Notes (Signed)
 Reports SHOB w exertion and intermittent central chest pain for 3 days.  Denies cough, fevers, NV Appears out of breath after ambulating to triage   States she missed 2-3 doses of diuretic this month  Hx of CHF

## 2023-07-06 NOTE — ED Provider Notes (Signed)
 [facility] Provider Note   CSN: [id number] Arrival date & time: 07/06/23  1821     History  Chief Complaint  Patient presents with   Shortness of Breath    Christine Singleton is a 33 y.o. female.  The history is provided by the patient and medical records. No language interpreter was used.  Shortness of Breath Severity:  Severe Onset quality:  Gradual Duration:  3 days Timing:  Intermittent Progression:  Waxing and waning Chronicity:  Recurrent Context: not URI   Relieved by:  Nothing Worsened by:  Exertion Ineffective treatments:  None tried Associated symptoms: chest pain   Associated symptoms: no abdominal pain, no cough, no fever, no headaches, no hemoptysis, no neck pain, no rash, no sputum production, no vomiting and no wheezing   Risk factors: hx of PE/DVT        Home Medications Prior to Admission medications   Medication Sig Start Date End Date Taking? Authorizing Provider  Accu-Chek Softclix Lancets lancets Use to check blood sugar once daily. 11/04/22   Newlin, Enobong, MD  acetaminophen  (TYLENOL ) 325 MG tablet Take 2 tablets (650 mg total) by mouth every 6 (six) hours as needed for mild pain or moderate pain (or Fever >/= 101). 08/19/22   Arrien, Curlee Doss, MD  Blood Glucose Monitoring Suppl (ACCU-CHEK GUIDE) w/Device KIT Use to check blood sugar once daily. 11/04/22   Newlin, Enobong, MD  Blood Pressure KIT Please check blood pressure every morning and keep a log. Patient not taking: Reported on 04/20/2023 08/19/22   Arrien, Mauricio Daniel, MD  Dulaglutide  (TRULICITY ) 0.75 MG/0.5ML SOAJ Inject 0.75 mg into the skin once a week. 06/25/23   Newlin, Enobong, MD  empagliflozin  (JARDIANCE ) 10 MG TABS tablet Take 1 tablet (10 mg total) by mouth daily. 09/05/22   Arleene Belt, PA-C  furosemide  (LASIX ) 40 MG tablet Take 1 tablet (40 mg total) by mouth daily. 04/17/23 04/16/24  Leona Rake, MD  glucose blood  (ACCU-CHEK GUIDE) test strip Use to check blood sugar once daily. 11/04/22   Newlin, Enobong, MD  guaiFENesin  (MUCINEX ) 600 MG 12 hr tablet Take 600 mg by mouth 2 (two) times daily as needed for cough or to loosen phlegm. Patient not taking: Reported on 05/08/2023    [provider]  metFORMIN  (GLUCOPHAGE ) 500 MG tablet Take 1 tablet (500 mg total) by mouth daily with breakfast. 05/31/23   Marius Siemens, NP  OXYGEN Inhale 2 application  into the lungs as needed (as needed).    [provider]  rivaroxaban  (XARELTO ) 20 MG TABS tablet Take 1 tablet (20 mg total) by mouth daily. 04/18/23   Leona Rake, MD  spironolactone  (ALDACTONE ) 25 MG tablet Take 1 tablet (25 mg total) by mouth daily. 09/05/22 06/27/23  Arleene Belt, PA-C      Allergies    Strawberry (diagnostic)    Review of Systems   Review of Systems  Constitutional:  Positive for fatigue. Negative for chills and fever.  HENT:  Negative for congestion.   Eyes:  Negative for visual disturbance.  Respiratory:  Positive for chest tightness and shortness of breath. Negative for cough, hemoptysis, sputum production, wheezing and stridor.   Cardiovascular:  Positive for chest pain and leg swelling (bilateral and mild). Negative for palpitations.  Gastrointestinal:  Negative for abdominal pain, constipation, diarrhea, nausea and vomiting.  Genitourinary:  Negative for dysuria.  Musculoskeletal:  Negative for back pain, neck pain and neck stiffness.  Skin:  Negative for rash and wound.  Neurological:  Negative for weakness, light-headedness and headaches.  Psychiatric/Behavioral:  Negative for agitation and confusion.   All other systems reviewed and are negative.   Physical Exam Updated Vital Signs BP (!) 142/85   Pulse (!) 103   Temp 97.8 F (36.6 C) (Oral)   Resp 20   Ht 5\' 2"  (1.575 m)   Wt (!) 152.4 kg   SpO2 94%   BMI 61.46 kg/m  Physical Exam Vitals and nursing note reviewed.  Constitutional:       General: She is not in acute distress.    Appearance: She is well-developed. She is not ill-appearing, toxic-appearing or diaphoretic.  HENT:     Head: Normocephalic and atraumatic.  Eyes:     Extraocular Movements: Extraocular movements intact.     Conjunctiva/sclera: Conjunctivae normal.     Pupils: Pupils are equal, round, and reactive to light.  Cardiovascular:     Rate and Rhythm: Regular rhythm. Tachycardia present.     Heart sounds: No murmur heard. Pulmonary:     Effort: Pulmonary effort is normal. No respiratory distress.     Breath sounds: Rales present.  Chest:     Chest wall: Tenderness present.  Abdominal:     Palpations: Abdomen is soft.     Tenderness: There is no abdominal tenderness.  Musculoskeletal:        General: No swelling.     Cervical back: Neck supple.  Skin:    General: Skin is warm and dry.     Capillary Refill: Capillary refill takes less than 2 seconds.  Neurological:     Mental Status: She is alert.  Psychiatric:        Mood and Affect: Mood normal.     ED Results / Procedures / Treatments   Labs (all labs ordered are listed, but only abnormal results are displayed) Labs Reviewed  BASIC METABOLIC PANEL WITH GFR - Abnormal; Notable for the following components:      Result Value   Glucose, Bld 109 (*)    Calcium 8.8 (*)    All other components within normal limits  CBC - Abnormal; Notable for the following components:   WBC 14.4 (*)    RBC 6.09 (*)    MCV 71.3 (*)    MCH 23.3 (*)    RDW 21.9 (*)    Platelets 406 (*)    All other components within normal limits  PREGNANCY, URINE  BRAIN NATRIURETIC PEPTIDE  TROPONIN I (HIGH SENSITIVITY)  TROPONIN I (HIGH SENSITIVITY)    EKG None  Radiology DG Chest 2 View Result Date: 07/06/2023 CLINICAL DATA:  Central chest pain. EXAM: CHEST - 2 VIEW COMPARISON:  April 13, 2023 FINDINGS: The heart size and mediastinal contours are within normal limits. Both lungs are clear. The  visualized skeletal structures are unremarkable. IMPRESSION: No active cardiopulmonary disease. Electronically Signed   By: Virgle Grime M.D.   On: 07/06/2023 22:15    Procedures Procedures    Medications Ordered in ED Medications  albuterol  (VENTOLIN  HFA) 108 (90 Base) MCG/ACT inhaler 2 puff (has no administration in time range)  furosemide  (LASIX ) injection 40 mg (has no administration in time range)    ED Course/ Medical Decision Making/ A&P                                 Medical Decision Making Amount and/or Complexity of  Data Reviewed Labs: ordered. Radiology: ordered.  Risk Prescription drug management. Decision regarding hospitalization.    Latoyia Tecson is a 33 y.o. female with a past medical history significant for CHF, previous respiratory failure requiring admission, sleep apnea with home nighttime oxygen use and home CPAP, previous pulmonary embolism on Eliquis  therapy, and diabetes who presents with concern for fluid overload and shortness of breath.  According to patient, the last Avril days she has had mild edema in both legs and feels like he is going on to fluid in her chest.  She reports that she is very winded and cannot walk around like she normally does.  She does not take oxygen at home during the daytime and has been very short of breath.  She does agree that she may have had some dietary changes over the Easter holiday where she likely ate ham and other salty things without making any changes to her diuretic management.  She does say she is very good about her Eliquis  and has not missed any doses.  She reports this does not feel like pain she has had when she had a blood clot last year but instead feels like when she had to be admitted for fluid overload in January.  She reports no unilateral leg symptoms and denies any trauma.  She denies any fevers, chills, congestion, or cough.  She denies nausea, vomiting, constipation or diarrhea symptoms.  On exam,  patient did have some rales in the bases and chest was slightly tender to palpation.  I did not appreciate a murmur.  Abdomen nontender.  She did have good pulses in extremities and legs had some edema bilaterally.  Exam otherwise unremarkable.  Clinically I am concerned about fluid overload in the setting of holiday meal intake and extra sodium over the weekend leading to fluid retention.  She did have some rales on exam however the x-ray in triage did not show extra fluid.  Initial troponin is normal and just given her description of symptoms I have less suspicion for pulmonary embolism.  We did a amatory pulse oximetry and patient got extremely winded.  Oxygen saturation dropped to 81% and heart rate jumped to the 140s.  She was having exertional chest discomfort with it although it was not pleuritic.  Clinically I am concerned about fluid overload and heart failure exacerbation again.  BNP is still in process and has not yet returned however her kidney function was normal.  Will order IV diuretic and will call for admission for new oxygen requirement and exertional hypoxia.  10:43 PM Hospitalist team will admit for further management.         Final Clinical Impression(s) / ED Diagnoses Final diagnoses:  Exertional shortness of breath  Hypoxia  Hypervolemia, unspecified hypervolemia type  Exertional chest pain     Clinical Impression: 1. Exertional shortness of breath   2. Hypoxia   3. Hypervolemia, unspecified hypervolemia type   4. Exertional chest pain     Disposition: Admit  This note was prepared with assistance of Dragon voice recognition software. Occasional wrong-word or sound-a-like substitutions may have occurred due to the inherent limitations of voice recognition software.      Aramis Weil, Marine Sia, MD 07/06/23 [phone]

## 2023-07-07 ENCOUNTER — Encounter (HOSPITAL_BASED_OUTPATIENT_CLINIC_OR_DEPARTMENT_OTHER): Payer: Self-pay | Admitting: Internal Medicine

## 2023-07-07 DIAGNOSIS — J9601 Acute respiratory failure with hypoxia: Secondary | ICD-10-CM | POA: Diagnosis not present

## 2023-07-07 DIAGNOSIS — Z86711 Personal history of pulmonary embolism: Secondary | ICD-10-CM | POA: Diagnosis not present

## 2023-07-07 DIAGNOSIS — Z7984 Long term (current) use of oral hypoglycemic drugs: Secondary | ICD-10-CM | POA: Diagnosis not present

## 2023-07-07 DIAGNOSIS — Z79899 Other long term (current) drug therapy: Secondary | ICD-10-CM | POA: Diagnosis not present

## 2023-07-07 DIAGNOSIS — I110 Hypertensive heart disease with heart failure: Secondary | ICD-10-CM | POA: Diagnosis not present

## 2023-07-07 DIAGNOSIS — R079 Chest pain, unspecified: Secondary | ICD-10-CM | POA: Diagnosis not present

## 2023-07-07 DIAGNOSIS — E119 Type 2 diabetes mellitus without complications: Secondary | ICD-10-CM | POA: Diagnosis not present

## 2023-07-07 DIAGNOSIS — Z7901 Long term (current) use of anticoagulants: Secondary | ICD-10-CM | POA: Diagnosis not present

## 2023-07-07 DIAGNOSIS — Z7985 Long-term (current) use of injectable non-insulin antidiabetic drugs: Secondary | ICD-10-CM | POA: Diagnosis not present

## 2023-07-07 DIAGNOSIS — I5033 Acute on chronic diastolic (congestive) heart failure: Secondary | ICD-10-CM

## 2023-07-07 DIAGNOSIS — R0602 Shortness of breath: Secondary | ICD-10-CM | POA: Diagnosis present

## 2023-07-07 LAB — CBG MONITORING, ED
Glucose-Capillary: 146 mg/dL — ABNORMAL HIGH (ref 70–99)
Glucose-Capillary: 146 mg/dL — ABNORMAL HIGH (ref 70–99)
Glucose-Capillary: 113 mg/dL — ABNORMAL HIGH (ref 70–99)

## 2023-07-07 LAB — PREGNANCY, URINE: Preg Test, Ur: NEGATIVE

## 2023-07-07 LAB — GLUCOSE, CAPILLARY
Glucose-Capillary: 110 mg/dL — ABNORMAL HIGH (ref 70–99)
Glucose-Capillary: 206 mg/dL — ABNORMAL HIGH (ref 70–99)

## 2023-07-07 MED ORDER — RIVAROXABAN 20 MG PO TABS
20.0000 mg | ORAL_TABLET | Freq: Every day | ORAL | Status: DC
  Administered 2023-07-07 – 2023-07-08 (×2): 20 mg via ORAL
  Filled 2023-07-07 (×2): qty 1

## 2023-07-07 MED ORDER — RIVAROXABAN 20 MG PO TABS: 20.0000 mg | ORAL_TABLET | Freq: Every day | ORAL | Status: DC

## 2023-07-07 MED ORDER — BUTALBITAL-APAP-CAFFEINE 50-325-40 MG PO TABS
1.0000 | ORAL_TABLET | ORAL | Status: DC | PRN
  Administered 2023-07-07: 2 via ORAL
  Filled 2023-07-07: qty 2

## 2023-07-07 MED ORDER — EMPAGLIFLOZIN 10 MG PO TABS
10.0000 mg | ORAL_TABLET | Freq: Every day | ORAL | Status: DC
  Administered 2023-07-08: 10 mg via ORAL
  Filled 2023-07-07: qty 1

## 2023-07-07 MED ORDER — FUROSEMIDE 40 MG PO TABS
40.0000 mg | ORAL_TABLET | Freq: Every day | ORAL | Status: DC
  Administered 2023-07-07: 40 mg via ORAL
  Filled 2023-07-07: qty 1

## 2023-07-07 MED ORDER — TRAZODONE HCL 50 MG PO TABS: 25.0000 mg | ORAL_TABLET | Freq: Every evening | ORAL | Status: DC | PRN

## 2023-07-07 MED ORDER — ALBUTEROL SULFATE (2.5 MG/3ML) 0.083% IN NEBU: 2.5000 mg | INHALATION_SOLUTION | RESPIRATORY_TRACT | Status: DC | PRN

## 2023-07-07 MED ORDER — ACETAMINOPHEN 325 MG PO TABS: 650.0000 mg | ORAL_TABLET | Freq: Four times a day (QID) | ORAL | Status: DC | PRN

## 2023-07-07 MED ORDER — METFORMIN HCL 500 MG PO TABS
500.0000 mg | ORAL_TABLET | Freq: Every day | ORAL | Status: DC
  Administered 2023-07-07 – 2023-07-08 (×2): 500 mg via ORAL
  Filled 2023-07-07 (×2): qty 1

## 2023-07-07 MED ORDER — FUROSEMIDE 10 MG/ML IJ SOLN
40.0000 mg | Freq: Every day | INTRAMUSCULAR | Status: DC
  Administered 2023-07-07: 40 mg via INTRAVENOUS
  Filled 2023-07-07 (×2): qty 4

## 2023-07-07 MED ORDER — ONDANSETRON HCL 4 MG/2ML IJ SOLN: 4.0000 mg | Freq: Four times a day (QID) | INTRAMUSCULAR | Status: DC | PRN

## 2023-07-07 MED ORDER — EMPAGLIFLOZIN 10 MG PO TABS: 10.0000 mg | ORAL_TABLET | Freq: Every day | ORAL | Status: DC

## 2023-07-07 MED ORDER — INSULIN ASPART 100 UNIT/ML IJ SOLN: 0.0000 [IU] | Freq: Every day | INTRAMUSCULAR | Status: DC

## 2023-07-07 MED ORDER — INSULIN ASPART 100 UNIT/ML IJ SOLN
0.0000 [IU] | Freq: Three times a day (TID) | INTRAMUSCULAR | Status: DC
  Administered 2023-07-07: 2 [IU] via SUBCUTANEOUS

## 2023-07-07 MED ORDER — ONDANSETRON HCL 4 MG PO TABS: 4.0000 mg | ORAL_TABLET | Freq: Four times a day (QID) | ORAL | Status: DC | PRN

## 2023-07-07 MED ORDER — ACETAMINOPHEN 500 MG PO TABS
1000.0000 mg | ORAL_TABLET | Freq: Once | ORAL | Status: AC
  Administered 2023-07-07: 1000 mg via ORAL
  Filled 2023-07-07: qty 2

## 2023-07-07 MED ORDER — INSULIN ASPART 100 UNIT/ML IJ SOLN
0.0000 [IU] | Freq: Three times a day (TID) | INTRAMUSCULAR | Status: DC
  Administered 2023-07-08: 3 [IU] via SUBCUTANEOUS

## 2023-07-07 MED ORDER — ACETAMINOPHEN 650 MG RE SUPP: 650.0000 mg | Freq: Four times a day (QID) | RECTAL | Status: DC | PRN

## 2023-07-07 NOTE — ED Notes (Signed)
 Pt given food w medication. EDP stated to admin insulin  sliding scale and metformin 

## 2023-07-07 NOTE — ED Notes (Signed)
 Placed patient on 4L [place] due to patient having OSA. Patient positioned in sniffing position in bed. Patient tolerating well.

## 2023-07-07 NOTE — Progress Notes (Signed)
CBG 206 

## 2023-07-07 NOTE — Progress Notes (Signed)
   07/07/23 2347  BiPAP/CPAP/SIPAP  $ Non-Invasive Home Ventilator  Initial  BiPAP/CPAP/SIPAP Pt Type Adult  BiPAP/CPAP/SIPAP Resmed  Mask Type Nasal mask  Dentures removed? Not applicable  Mask Size Small  Respiratory Rate 17 breaths/min  Pressure Support 7 cmH20  FiO2 (%) 21 %  Patient Home Machine No  Patient Home Mask No  Patient Home Tubing No  Auto Titrate Yes  Minimum cmH2O 7 cmH2O (PS of 7cm H2O)  Maximum cmH2O 20 cmH2O  Device Plugged into RED Power Outlet Yes  BiPAP/CPAP /SiPAP Vitals  Resp 17  MEWS Score/Color  MEWS Score 1  MEWS Score Color Green

## 2023-07-07 NOTE — ED Notes (Signed)
 [facility] called for transport.

## 2023-07-07 NOTE — H&P (Signed)
 History and Physical  Christine Singleton JJO:[id number] DOB: 1990-07-21 DOA: 07/06/2023  PCP: Marius Siemens, NP   Chief Complaint: Shortness of breath  HPI: Christine Singleton is a 33 y.o. female with medical history significant for well-controlled type 2 diabetes, hypertension, PE on Xarelto , super morbid obesity, OSA on nightly BiPAP, heart failure with reduced EF being admitted to the hospital with hypoxic respiratory failure likely due to heart failure exacerbation.  Patient states that she is pretty well compliant with her diet, fluid recommendations.  She has done a good job of controlling her blood sugar, is compliant with her medications.  States that over the last week or so since it has been quite hot, she might have been drinking a little bit more water than she should have.  Over the last 4 to 5 days, especially in the last 48 hours, she has felt increasing dyspnea with exertion, even minimal exertion such as tying her shoes.  She denies any chest pain, she did have some bilateral lower extremity edema.  Workup in the emergency department last night showed evidence of acute heart failure, including volume overload on exam.  She was started on IV Lasix , and the patient states that she feels much better now, and her lower extremity edema seems to have resolved.  Review of Systems: Please see HPI for pertinent positives and negatives. A complete 10 system review of systems are otherwise negative.  Past Medical History:  Diagnosis Date   CHF (congestive heart failure) (HCC)    Gestational diabetes    With first pregnancy   History of cesarean delivery 04/23/2016   Hypertension    PE (pulmonary thromboembolism) (HCC)    Sleep apnea    Past Surgical History:  Procedure Laterality Date   CESAREAN SECTION     CESAREAN SECTION N/A 02/03/2016   Procedure: CESAREAN SECTION;  Surgeon: Rik Chasten, MD;  Location: [facility];  Service: Obstetrics;  Laterality: N/A;   Social  History:  reports that she has never smoked. She has never used smokeless tobacco. She reports that she does not drink alcohol and does not use drugs.  Allergies  Allergen Reactions   Strawberry (Diagnostic) Anaphylaxis    Family History  Problem Relation Age of Onset   Migraines Mother    Hypertension Mother    Hyperlipidemia Mother    Hypertension Father    Hyperlipidemia Father    Asthma Sister    Cancer Neg Hx    Diabetes Neg Hx      Prior to Admission medications   Medication Sig Start Date End Date Taking? Authorizing Provider  Accu-Chek Softclix Lancets lancets Use to check blood sugar once daily. 11/04/22   Newlin, Enobong, MD  acetaminophen  (TYLENOL ) 325 MG tablet Take 2 tablets (650 mg total) by mouth every 6 (six) hours as needed for mild pain or moderate pain (or Fever >/= 101). 08/19/22   Arrien, Curlee Doss, MD  Blood Glucose Monitoring Suppl (ACCU-CHEK GUIDE) w/Device KIT Use to check blood sugar once daily. 11/04/22   Newlin, Enobong, MD  Blood Pressure KIT Please check blood pressure every morning and keep a log. Patient not taking: Reported on 04/20/2023 08/19/22   Arrien, Mauricio Daniel, MD  Dulaglutide  (TRULICITY ) 0.75 MG/0.5ML SOAJ Inject 0.75 mg into the skin once a week. 06/25/23   Newlin, Enobong, MD  empagliflozin  (JARDIANCE ) 10 MG TABS tablet Take 1 tablet (10 mg total) by mouth daily. 09/05/22   Arleene Belt, PA-C  furosemide  (LASIX ) 40 MG  tablet Take 1 tablet (40 mg total) by mouth daily. 04/17/23 04/16/24  Leona Rake, MD  glucose blood (ACCU-CHEK GUIDE) test strip Use to check blood sugar once daily. 11/04/22   Newlin, Enobong, MD  guaiFENesin  (MUCINEX ) 600 MG 12 hr tablet Take 600 mg by mouth 2 (two) times daily as needed for cough or to loosen phlegm. Patient not taking: Reported on 05/08/2023    [provider]  metFORMIN  (GLUCOPHAGE ) 500 MG tablet Take 1 tablet (500 mg total) by mouth daily with breakfast. 05/31/23   Marius Siemens,  NP  OXYGEN Inhale 2 application  into the lungs as needed (as needed).    [provider]  rivaroxaban  (XARELTO ) 20 MG TABS tablet Take 1 tablet (20 mg total) by mouth daily. 04/18/23   Leona Rake, MD  spironolactone  (ALDACTONE ) 25 MG tablet Take 1 tablet (25 mg total) by mouth daily. 09/05/22 06/27/23  Arleene Belt, PA-C    Physical Exam: BP 134/88 (BP Location: Left Wrist)   Pulse 98   Temp 97.6 F (36.4 C) (Oral)   Resp 16   Ht 5\' 2"  (1.575 m)   Wt (!) 152.4 kg   SpO2 95%   BMI 61.46 kg/m  General:  Alert, oriented, calm, in no acute distress, speaking in full sentences, resting comfortably on room air Cardiovascular: RRR, no murmurs or rubs, no peripheral edema  Respiratory: clear to auscultation bilaterally on anterior examination, no wheezes, no crackles  Abdomen: soft, nontender, nondistended, normal bowel tones heard  Skin: dry, no rashes  Musculoskeletal: no joint effusions, normal range of motion  Psychiatric: appropriate affect, normal speech  Neurologic: extraocular muscles intact, clear speech, moving all extremities with intact sensorium         Labs on Admission:  Basic Metabolic Panel: Recent Labs  Lab 07/06/23 1855  NA 138  K 4.3  CL 103  CO2 28  GLUCOSE 109*  BUN 11  CREATININE 0.70  CALCIUM 8.8*   Liver Function Tests: No results for input(s): "AST", "ALT", "ALKPHOS", "BILITOT", "PROT", "ALBUMIN" in the last 168 hours. No results for input(s): "LIPASE", "AMYLASE" in the last 168 hours. No results for input(s): "AMMONIA" in the last 168 hours. CBC: Recent Labs  Lab 07/06/23 1855  WBC 14.4*  HGB 14.2  HCT 43.4  MCV 71.3*  PLT 406*   Cardiac Enzymes: No results for input(s): "CKTOTAL", "CKMB", "CKMBINDEX", "TROPONINI" in the last 168 hours. BNP (last 3 results) Recent Labs    03/30/23 2115 04/13/23 1647 07/06/23 2209  BNP 141.6* 226.2* 78.8    ProBNP (last 3 results) No results for input(s): "PROBNP" in the last  8760 hours.  CBG: Recent Labs  Lab 07/07/23 0559 07/07/23 0731 07/07/23 1156  GLUCAP 146* 146* 113*    Radiological Exams on Admission: DG Chest 2 View Result Date: 07/06/2023 CLINICAL DATA:  Central chest pain. EXAM: CHEST - 2 VIEW COMPARISON:  April 13, 2023 FINDINGS: The heart size and mediastinal contours are within normal limits. Both lungs are clear. The visualized skeletal structures are unremarkable. IMPRESSION: No active cardiopulmonary disease. Electronically Signed   By: Virgle Grime M.D.   On: 07/06/2023 22:15   Assessment/Plan Christine Singleton is a 33 y.o. female with medical history significant for well-controlled type 2 diabetes, hypertension, PE on Xarelto , super morbid obesity, OSA on nightly BiPAP, heart failure with reduced EF being admitted to the hospital with hypoxic respiratory failure likely due to heart failure exacerbation.   Acute on chronic heart failure  with reduced EF-while the patient has normal BNP on presentation as well as no active cardiopulmonary disease seen on chest x-ray, she did have lower extremity edema.  She also feels much better after diuresis with IV Lasix .  Volume status is often difficult to determine, given the patient's body habitus.  Patient's clinical improvement is evidence that she likely is suffering from a degree of acute heart failure exacerbation. -Observation admission -Continue telemetry monitor -Continue diabetic and heart healthy diet, with fluid restriction -Appropriate home cardiac medications continued -Continue gentle diuresis with Lasix  40 mg IV daily -Anticipate if continues to improve overnight, can likely discharge home in the morning on home dose Lasix   Non-insulin -dependent type 2 diabetes-very well-controlled, continue carb modified diet, home Jardiance  and metformin   Headache-Fioricet  as needed  History of PE-continue Xarelto , which she has been taking without interruption    Code Status: Full  Code  Consults called: None  Admission status: Observation  Time spent: 59 minutes  Christine Deacon Rickey Charm MD [facility] Pager [phone]  If 7PM-7AM, please contact night-coverage [email or web address] Password [facility]  07/07/2023, 4:47 PM

## 2023-07-08 ENCOUNTER — Other Ambulatory Visit (HOSPITAL_BASED_OUTPATIENT_CLINIC_OR_DEPARTMENT_OTHER): Payer: Self-pay

## 2023-07-08 DIAGNOSIS — R0602 Shortness of breath: Principal | ICD-10-CM

## 2023-07-08 DIAGNOSIS — E8770 Fluid overload, unspecified: Secondary | ICD-10-CM

## 2023-07-08 DIAGNOSIS — I5043 Acute on chronic combined systolic (congestive) and diastolic (congestive) heart failure: Secondary | ICD-10-CM

## 2023-07-08 LAB — BASIC METABOLIC PANEL WITH GFR
Anion gap: 10 (ref 5–15)
BUN: 11 mg/dL (ref 6–20)
CO2: 31 mmol/L (ref 22–32)
Calcium: 9 mg/dL (ref 8.9–10.3)
Chloride: 96 mmol/L — ABNORMAL LOW (ref 98–111)
Creatinine, Ser: 0.68 mg/dL (ref 0.44–1.00)
GFR, Estimated: >60 mL/min (ref >60)
Glucose, Bld: 106 mg/dL — ABNORMAL HIGH (ref 70–99)
Potassium: 4 mmol/L (ref 3.5–5.1)
Sodium: 137 mmol/L (ref 135–145)

## 2023-07-08 LAB — CBC
HCT: 45.3 % (ref 36.0–46.0)
Hemoglobin: 14.4 g/dL (ref 12.0–15.0)
MCH: 23.1 pg — ABNORMAL LOW (ref 26.0–34.0)
MCHC: 31.8 g/dL (ref 30.0–36.0)
MCV: 72.7 fL — ABNORMAL LOW (ref 80.0–100.0)
Platelets: 360 10*3/uL (ref 150–400)
RBC: 6.23 MIL/uL — ABNORMAL HIGH (ref 3.87–5.11)
RDW: 22.1 % — ABNORMAL HIGH (ref 11.5–15.5)
WBC: 15 10*3/uL — ABNORMAL HIGH (ref 4.0–10.5)
nRBC: 0 % (ref 0.0–0.2)

## 2023-07-08 LAB — GLUCOSE, CAPILLARY: Glucose-Capillary: 130 mg/dL — ABNORMAL HIGH (ref 70–99)

## 2023-07-08 MED ORDER — TORSEMIDE 20 MG PO TABS
20.0000 mg | ORAL_TABLET | Freq: Every day | ORAL | 2 refills | Status: DC
  Filled 2023-07-08: qty 30, 30d supply, fill #0
  Filled 2023-09-02: qty 30, 30d supply, fill #1

## 2023-07-08 MED ORDER — EMPAGLIFLOZIN 10 MG PO TABS
10.0000 mg | ORAL_TABLET | Freq: Every day | ORAL | 3 refills | Status: DC
  Filled 2023-07-08: qty 30, 30d supply, fill #0
  Filled 2023-09-02: qty 30, 30d supply, fill #1

## 2023-07-08 MED ORDER — TRULICITY 0.75 MG/0.5ML ~~LOC~~ SOAJ: 0.7500 mg | SUBCUTANEOUS | Status: DC

## 2023-07-08 MED ORDER — SPIRONOLACTONE 25 MG PO TABS
25.0000 mg | ORAL_TABLET | Freq: Every day | ORAL | 3 refills | Status: DC
  Filled 2023-07-08: qty 30, 30d supply, fill #0
  Filled 2023-09-02: qty 30, 30d supply, fill #1

## 2023-07-08 NOTE — Discharge Summary (Signed)
 Physician Discharge Summary   Patient: Christine Singleton MRN: [id number] DOB: Sep 01, 1990  Admit date:     07/06/2023  Discharge date: 07/08/23  Discharge Physician: Luna Salinas   PCP: Marius Siemens, NP   Recommendations at discharge:  Please obtain CBC and BMP and follow-up Follow-up with primary care provider within a week Follow-up with cardiology and in heart failure clinic  Discharge Diagnoses: Principal Problem:   Acute on chronic diastolic CHF (congestive heart failure) (HCC) Active Problems:   Exertional shortness of breath   Hypervolemia   Hospital Course: Taken from H&P.   Christine Singleton is a 33 y.o. female with medical history significant for well-controlled type 2 diabetes, hypertension, PE on Xarelto , super morbid obesity, OSA on nightly BiPAP, heart failure with reduced EF being admitted to the hospital with hypoxic respiratory failure likely due to heart failure exacerbation with worsening exertional dyspnea and bilateral lower extremity edema.  On presentation mildly elevated blood pressure, she was on 2 L of oxygen without any documented hypoxia, labs with some leukocytosis at 14.4 but it seems chronic, BNP at 78 and negative troponin.  CXR normal  Patient was started on IV Lasix .  4/23: Vitals and labs stable.  Prior echo with EF of 45 to 50%.  Lower extremity edema resolved and patient appears to be at baseline.  BNP was not significantly elevated.  Per patient she might be drinking more water to keep herself hydrated.  We switched her home Lasix  with torsemide  with instruction to take an extra pill with weight gain.  Torsemide  dose can be titrated by PCP or her cardiologist.  She has not seen her cardiologist for a while.  She was instructed to follow-up as soon as possible.  She was also given referral for heart failure clinic.  Patient will continue the rest of her home medications, few new prescriptions were provided as prior was expired.  Patient need  to follow-up with her PCP and cardiologist for further assistance.  Consultants: None Procedures performed: None Disposition: Home Diet recommendation:  Discharge Diet Orders (From admission, onward)     Start     Ordered   07/08/23 0000  Diet - low sodium heart healthy        07/08/23 1011           Cardiac and Carb modified diet DISCHARGE MEDICATION: Allergies as of 07/08/2023       Reactions   Strawberry Extract Anaphylaxis        Medication List     STOP taking these medications    furosemide  40 MG tablet Commonly known as: Lasix        TAKE these medications    Accu-Chek Guide test strip Generic drug: glucose blood Use to check blood sugar once daily.   Accu-Chek Guide w/Device Kit Use to check blood sugar once daily.   Accu-Chek Softclix Lancets lancets Use to check blood sugar once daily.   Blood Pressure Kit Please check blood pressure every morning and keep a log.   empagliflozin  10 MG Tabs tablet Commonly known as: JARDIANCE  Take 1 tablet (10 mg total) by mouth daily.   guaiFENesin  600 MG 12 hr tablet Commonly known as: MUCINEX  Take 600 mg by mouth 2 (two) times daily as needed for cough or to loosen phlegm.   metFORMIN  500 MG tablet Commonly known as: GLUCOPHAGE  Take 1 tablet (500 mg total) by mouth daily with breakfast.   OXYGEN Inhale 3 L/min into the lungs at bedtime.   PRESCRIPTION MEDICATION  See admin instructions. CPAP- At bedtime   spironolactone  25 MG tablet Commonly known as: ALDACTONE  Take 1 tablet (25 mg total) by mouth daily.   torsemide  20 MG tablet Commonly known as: DEMADEX  Take 1 tablet (20 mg total) by mouth daily.   Trulicity  0.75 MG/0.5ML Soaj Generic drug: Dulaglutide  Inject 0.75 mg into the skin once a week. What changed: when to take this   TYLENOL  500 MG tablet Generic drug: acetaminophen  Take 1,000 mg by mouth every 6 (six) hours as needed (for headaches). What changed: Another medication with the  same name was removed. Continue taking this medication, and follow the directions you see here.   Xarelto  20 MG Tabs tablet Generic drug: rivaroxaban  Take 1 tablet (20 mg total) by mouth daily.        Follow-up Information     Marius Siemens, NP. Schedule an appointment as soon as possible for a visit in 1 week(s).   Specialty: Internal Medicine Contact information: [address] [place] [address] [phone]                Discharge Exam: Cleavon Curls Weights   07/06/23 1849  Weight: (!) 152.4 kg   General.  Morbidly obese lady, in no acute distress. Pulmonary.  Lungs clear bilaterally, normal respiratory effort. CV.  Regular rate and rhythm, no JVD, rub or murmur. Abdomen.  Soft, nontender, nondistended, BS positive. CNS.  Alert and oriented .  No focal neurologic deficit. Extremities.  No edema, no cyanosis, pulses intact and symmetrical. Psychiatry.  Judgment and insight appears normal.   Condition at discharge: stable  The results of significant diagnostics from this hospitalization (including imaging, microbiology, ancillary and laboratory) are listed below for reference.   Imaging Studies: DG Chest 2 View Result Date: 07/06/2023 CLINICAL DATA:  Central chest pain. EXAM: CHEST - 2 VIEW COMPARISON:  April 13, 2023 FINDINGS: The heart size and mediastinal contours are within normal limits. Both lungs are clear. The visualized skeletal structures are unremarkable. IMPRESSION: No active cardiopulmonary disease. Electronically Signed   By: Virgle Grime M.D.   On: 07/06/2023 22:15    Microbiology: Results for orders placed or performed during the hospital encounter of 04/13/23  Resp panel by RT-PCR (RSV, Flu A&B, Covid) Anterior Nasal Swab     Status: None   Collection Time: 04/13/23  4:47 PM   Specimen: Anterior Nasal Swab  Result Value Ref Range Status   SARS Coronavirus 2 by RT PCR NEGATIVE NEGATIVE Final   Influenza A by PCR NEGATIVE NEGATIVE  Final   Influenza B by PCR NEGATIVE NEGATIVE Final    Comment: (NOTE) The Xpert Xpress SARS-CoV-2/FLU/RSV plus assay is intended as an aid in the diagnosis of influenza from Nasopharyngeal swab specimens and should not be used as a sole basis for treatment. Nasal washings and aspirates are unacceptable for Xpert Xpress SARS-CoV-2/FLU/RSV testing.  Fact Sheet for Patients: BloggerCourse.com  Fact Sheet for Healthcare Providers: SeriousBroker.it  This test is not yet approved or cleared by the [place]  FDA and has been authorized for detection and/or diagnosis of SARS-CoV-2 by FDA under an Emergency Use Authorization (EUA). This EUA will remain in effect (meaning this test can be used) for the duration of the COVID-19 declaration under Section 564(b)(1) of the Act, 21 U.S.C. section 360bbb-3(b)(1), unless the authorization is terminated or revoked.     Resp Syncytial Virus by PCR NEGATIVE NEGATIVE Final    Comment: (NOTE) Fact Sheet for Patients: BloggerCourse.com  Fact Sheet for Healthcare  Providers: SeriousBroker.it  This test is not yet approved or cleared by the [place]  FDA and has been authorized for detection and/or diagnosis of SARS-CoV-2 by FDA under an Emergency Use Authorization (EUA). This EUA will remain in effect (meaning this test can be used) for the duration of the COVID-19 declaration under Section 564(b)(1) of the Act, 21 U.S.C. section 360bbb-3(b)(1), unless the authorization is terminated or revoked.  Performed at [facility], [address] [place], [place] [address]     Labs: CBC: Recent Labs  Lab 07/06/23 1855 07/08/23 0409  WBC 14.4* 15.0*  HGB 14.2 14.4  HCT 43.4 45.3  MCV 71.3* 72.7*  PLT 406* 360   Basic Metabolic Panel: Recent Labs  Lab 07/06/23 1855 07/08/23 0409  NA 138 137  K 4.3 4.0  CL 103 96*  CO2 28 31   GLUCOSE 109* 106*  BUN 11 11  CREATININE 0.70 0.68  CALCIUM 8.8* 9.0   Liver Function Tests: No results for input(s): "AST", "ALT", "ALKPHOS", "BILITOT", "PROT", "ALBUMIN" in the last 168 hours. CBG: Recent Labs  Lab 07/07/23 0731 07/07/23 1156 07/07/23 1741 07/07/23 2000 07/08/23 0727  GLUCAP 146* 113* 110* 206* 130*    Discharge time spent: greater than 30 minutes.  This record has been created using Conservation officer, historic buildings. Errors have been sought and corrected,but may not always be located. Such creation errors do not reflect on the standard of care.   Signed: Luna Salinas, MD [facility] 07/08/2023

## 2023-07-08 NOTE — Hospital Course (Addendum)
 Taken from H&P.   Christine Singleton is a 33 y.o. female with medical history significant for well-controlled type 2 diabetes, hypertension, PE on Xarelto , super morbid obesity, OSA on nightly BiPAP, heart failure with reduced EF being admitted to the hospital with hypoxic respiratory failure likely due to heart failure exacerbation with worsening exertional dyspnea and bilateral lower extremity edema.  On presentation mildly elevated blood pressure, she was on 2 L of oxygen without any documented hypoxia, labs with some leukocytosis at 14.4 but it seems chronic, BNP at 78 and negative troponin.  CXR normal  Patient was started on IV Lasix .  4/23: Vitals and labs stable.  Prior echo with EF of 45 to 50%.  Lower extremity edema resolved and patient appears to be at baseline.  BNP was not significantly elevated.  Per patient she might be drinking more water to keep herself hydrated.  We switched her home Lasix  with torsemide  with instruction to take an extra pill with weight gain.  Torsemide  dose can be titrated by PCP or her cardiologist.  She has not seen her cardiologist for a while.  She was instructed to follow-up as soon as possible.  She was also given referral for heart failure clinic.  Patient will continue the rest of her home medications, few new prescriptions were provided as prior was expired.  Patient need to follow-up with her PCP and cardiologist for further assistance.

## 2023-07-08 NOTE — Progress Notes (Signed)
 AVS and discharge instructions reviewed w/ patient. Patient verbalized understanding and has transportation for self to home.

## 2023-07-09 ENCOUNTER — Telehealth: Payer: Self-pay

## 2023-07-09 NOTE — Transitions of Care (Post Inpatient/ED Visit) (Signed)
   07/09/2023  Name: Christine Singleton MRN: [id number] DOB: 1991/02/21  Today's TOC FU Call Status: Today's TOC FU Call Status:: Successful TOC FU Call Completed TOC FU Call Complete Date: 07/09/23 Patient's Name and Date of Birth confirmed.  Transition Care Management Follow-up Telephone Call Date of Discharge: 07/08/23 Discharge Facility: [facility]) Type of Discharge: Inpatient Admission Primary Inpatient Discharge Diagnosis:: acute on chronic diastokic CHF How have you been since you were released from the hospital?: Better (She said that she just thinks she overdid it at [place]) Any questions or concerns?: No  Items Reviewed: Did you receive and understand the discharge instructions provided?: Yes Medications obtained,verified, and reconciled?: No Medications Not Reviewed Reasons:: Other: (She said she has all medications and did not have any questions about the med regime and did not need to review the med list.  She also confirmed that she has a glucometer) Any new allergies since your discharge?: No Dietary orders reviewed?: Yes Type of Diet Ordered:: heart healthy, low sodium. Do you have support at home?: Yes People in Home [RPT]: grandparent(s), parent(s) Name of Support/Comfort Primary Source: her mother and grandmother  Medications Reviewed Today: Medications Reviewed Today   Medications were not reviewed in this encounter     Home Care and Equipment/Supplies: Were Home Health Services Ordered?: No Any new equipment or medical supplies ordered?: No (She already has a BiPAP and O2 that she uses @ 3L during the night.)  Functional Questionnaire: Do you need assistance with bathing/showering or dressing?: No Do you need assistance with meal preparation?: No Do you need assistance with eating?: No Do you have difficulty maintaining continence: No Do you need assistance with getting out of bed/getting out of a chair/moving?: No Do you have difficulty managing or  taking your medications?: No  Follow up appointments reviewed: PCP Follow-up appointment confirmed?: Yes Date of PCP follow-up appointment?: 08/18/23 Follow-up Provider: Otha Blight, NP- I offered to schedule her an appointment before 6/3 but she said to leave that appointment.  I told her that she can always call the clinic and request to be seen sooner Specialist Hospital Follow-up appointment confirmed?: Yes Date of Specialist follow-up appointment?: 11/04/23 Follow-Up Specialty Provider:: cardiology.   a referral has also been placed for CHF clinic Do you need transportation to your follow-up appointment?: No Do you understand care options if your condition(s) worsen?: Yes-patient verbalized understanding    SIGNATURE Burnett Carson, RN

## 2023-07-29 ENCOUNTER — Other Ambulatory Visit (HOSPITAL_BASED_OUTPATIENT_CLINIC_OR_DEPARTMENT_OTHER): Payer: Self-pay

## 2023-08-03 ENCOUNTER — Other Ambulatory Visit (INDEPENDENT_AMBULATORY_CARE_PROVIDER_SITE_OTHER): Payer: Self-pay | Admitting: Primary Care

## 2023-08-03 NOTE — Telephone Encounter (Signed)
 Copied from CRM [id number]. Topic: Clinical - Medication Refill >> Aug 03, 2023  3:40 PM Yolanda T wrote: Medication:  rivaroxaban  (XARELTO ) 20 MG TABS tablet   Has the patient contacted their pharmacy? No  This is the patient's preferred pharmacy:  [facility] [place] [facility] [address] [place] [address] Phone: [phone] Fax: [phone]  Is this the correct pharmacy for this prescription? Yes If no, delete pharmacy and type the correct one.   Has the prescription been filled recently? Yes  Is the patient out of the medication? Yes  Has the patient been seen for an appointment in the last year OR does the patient have an upcoming appointment? Yes  Can we respond through MyChart? Yes  Agent: Please be advised that Rx refills may take up to 3 business days. We ask that you follow-up with your pharmacy.

## 2023-08-04 ENCOUNTER — Other Ambulatory Visit (HOSPITAL_BASED_OUTPATIENT_CLINIC_OR_DEPARTMENT_OTHER): Payer: Self-pay

## 2023-08-05 NOTE — Telephone Encounter (Signed)
 Requested medication (s) are due for refill today:   Yes  Requested medication (s) are on the active medication list:   Yes  Future visit scheduled:   Yes 6/3   Last ordered: 04/18/2023 #60, 0 refills  Unable to refill because last prescribed by another provider.     Requested Prescriptions  Pending Prescriptions Disp Refills   rivaroxaban  (XARELTO ) 20 MG TABS tablet 60 tablet 0    Sig: Take 1 tablet (20 mg total) by mouth daily.     Hematology: Anticoagulants - rivaroxaban  Passed - 08/05/2023  1:10 PM      Passed - ALT in normal range and within 360 days    ALT  Date Value Ref Range Status  04/13/2023 18 0 - 44 U/L Final    Comment:    HEMOLYSIS AT THIS LEVEL MAY AFFECT RESULT         Passed - AST in normal range and within 360 days    AST  Date Value Ref Range Status  04/13/2023 28 15 - 41 U/L Final    Comment:    HEMOLYSIS AT THIS LEVEL MAY AFFECT RESULT         Passed - Cr in normal range and within 360 days    Creatinine, Ser  Date Value Ref Range Status  07/08/2023 0.68 0.44 - 1.00 mg/dL Final   Creatinine, Urine  Date Value Ref Range Status  07/31/2015 415.50 >20.0 mg/dL Final    Comment:    Result repeated and verified. Result confirmed by automatic dilution.          Passed - HCT in normal range and within 360 days    HCT  Date Value Ref Range Status  07/08/2023 45.3 36.0 - 46.0 % Final         Passed - HGB in normal range and within 360 days    Hemoglobin  Date Value Ref Range Status  07/08/2023 14.4 12.0 - 15.0 g/dL Final   Hemoglobin Other  Date Value Ref Range Status  07/31/2015 31.1 % Final    Comment:    Abnormal hemoglobin detected. Reflexed for further evaluation. Report to follow.   Reviewed by Margert Sheerer, MD, PhD, FCAP (Electronic Signature on File)          Passed - PLT in normal range and within 360 days    Platelets  Date Value Ref Range Status  07/08/2023 360 150 - 400 K/uL Final         Passed - eGFR is 15 or  above and within 360 days    GFR calc Af Amer  Date Value Ref Range Status  06/19/2015 >60 >60 mL/min Final    Comment:    (NOTE) The eGFR has been calculated using the CKD EPI equation. This calculation has not been validated in all clinical situations. eGFR's persistently <60 mL/min signify possible Chronic Kidney Disease.    GFR, Estimated  Date Value Ref Range Status  07/08/2023 >60 >60 mL/min Final    Comment:    (NOTE) Calculated using the CKD-EPI Creatinine Equation (2021)          Passed - Patient is not pregnant      Passed - Valid encounter within last 12 months    Recent Outpatient Visits           3 months ago Class 3 obesity   [place] [facility] Marius Siemens, NP   5 months ago OSA (obstructive sleep apnea)  [place] [facility] Marius Siemens, NP   6 months ago Non-insulin  dependent type 2 diabetes mellitus [facility])   [facility] Dept Of [place] Freada Jacobs, [place] L, RPH-CPP   7 months ago Long term (current) use of oral hypoglycemic drugs   [facility] - [facility] Dept Of [place]. [facility] Freada Jacobs, [place] L, RPH-CPP   7 months ago OSA (obstructive sleep apnea)    [facility] Marius Siemens, NP       Future Appointments             In 3 months Cleaver, Chet Cota, NP [facility] A Dept of [employer]. [facility] [employer], H&V

## 2023-08-11 ENCOUNTER — Telehealth (INDEPENDENT_AMBULATORY_CARE_PROVIDER_SITE_OTHER): Payer: Self-pay | Admitting: Primary Care

## 2023-08-11 NOTE — Telephone Encounter (Signed)
 Will forward to provider

## 2023-08-11 NOTE — Telephone Encounter (Signed)
 Social worker is calling about blood thinners. Med was prescribed by an ER dr and pt said she has been calling trying to get a refill. Social worker is calling asking for refill. Please advise.

## 2023-08-12 ENCOUNTER — Other Ambulatory Visit: Payer: Self-pay

## 2023-08-17 ENCOUNTER — Telehealth (INDEPENDENT_AMBULATORY_CARE_PROVIDER_SITE_OTHER): Payer: Self-pay | Admitting: Primary Care

## 2023-08-17 NOTE — Telephone Encounter (Signed)
 Called pt to confirm appt. Pt will be present at appt.

## 2023-08-18 ENCOUNTER — Inpatient Hospital Stay (INDEPENDENT_AMBULATORY_CARE_PROVIDER_SITE_OTHER): Admitting: Primary Care

## 2023-08-20 ENCOUNTER — Telehealth (INDEPENDENT_AMBULATORY_CARE_PROVIDER_SITE_OTHER): Admitting: Primary Care

## 2023-08-20 DIAGNOSIS — I2699 Other pulmonary embolism without acute cor pulmonale: Secondary | ICD-10-CM | POA: Diagnosis not present

## 2023-08-20 DIAGNOSIS — I5023 Acute on chronic systolic (congestive) heart failure: Secondary | ICD-10-CM

## 2023-08-20 DIAGNOSIS — I5043 Acute on chronic combined systolic (congestive) and diastolic (congestive) heart failure: Secondary | ICD-10-CM

## 2023-08-20 DIAGNOSIS — I5033 Acute on chronic diastolic (congestive) heart failure: Secondary | ICD-10-CM

## 2023-08-20 NOTE — Progress Notes (Unsigned)
 [facility]  {tele v video:[id number]}  I connected with Christine Singleton, on 08/20/2023 at 10:53 AM through an audio and video application and verified that I am speaking with the correct person using two identifiers.   Consent: I discussed the limitations, risks, security and privacy concerns of performing an evaluation and management service by mychart and the availability of in person appointments. I also discussed with the patient that there may be a patient responsible charge related to this service. The patient expressed understanding and agreed to proceed.   Location of Patient: Home  Location of Provider: [facility]   Persons participating in visit: Christine Singleton Christine Schick,  NP   History of Present Illness: Christine Singleton spoke with 33 year old female regarding previous hospitalization for acute on chronic diastolic congestive heart failure patient presented to the hospital with exertional shortness of breath and hypovolemia.  There were no follow-up appointments for heart failure clinic.  Reached out to cardiology and fortunately he was at hour and a half be hide on his patient schedule and a appointment has not been scheduled.  Reviewing the diuretics and medication she is on will have her to come in for a routine CMP CBC and send a message to cardiology for any further workup that needs to be done while awaiting appointment.  This was explained to patient   Past Medical History:  Diagnosis Date   CHF (congestive heart failure) (HCC)    Gestational diabetes    With first pregnancy   History of cesarean delivery 04/23/2016   Hypertension    PE (pulmonary thromboembolism) (HCC)    Sleep apnea    Allergies  Allergen Reactions   Strawberry Extract Anaphylaxis    Current Outpatient Medications on File Prior to Visit  Medication Sig Dispense Refill   Accu-Chek Softclix Lancets lancets Use to check blood  sugar once daily. 100 each 6   Blood Glucose Monitoring Suppl (ACCU-CHEK GUIDE) w/Device KIT Use to check blood sugar once daily. 1 kit 0   Blood Pressure KIT Please check blood pressure every morning and keep a log. 1 kit 0   Dulaglutide  (TRULICITY ) 0.75 MG/0.5ML SOAJ Inject 0.75 mg into the skin every Monday.     empagliflozin  (JARDIANCE ) 10 MG TABS tablet Take 1 tablet (10 mg total) by mouth daily. 30 tablet 3   glucose blood (ACCU-CHEK GUIDE) test strip Use to check blood sugar once daily. 100 each 6   guaiFENesin  (MUCINEX ) 600 MG 12 hr tablet Take 600 mg by mouth 2 (two) times daily as needed for cough or to loosen phlegm.     metFORMIN  (GLUCOPHAGE ) 500 MG tablet Take 1 tablet (500 mg total) by mouth daily with breakfast. 90 tablet 1   OXYGEN Inhale 3 L/min into the lungs at bedtime.     PRESCRIPTION MEDICATION See admin instructions. CPAP- At bedtime     rivaroxaban  (XARELTO ) 20 MG TABS tablet Take 1 tablet (20 mg total) by mouth daily. 60 tablet 0   spironolactone  (ALDACTONE ) 25 MG tablet Take 1 tablet (25 mg total) by mouth daily. 30 tablet 3   torsemide  (DEMADEX ) 20 MG tablet Take 1 tablet (20 mg total) by mouth daily. 30 tablet 2   TYLENOL  500 MG tablet Take 1,000 mg by mouth every 6 (six) hours as needed (for headaches).     No current facility-administered medications on file prior to visit.    Observations/Objective: ***  Assessment and Plan: ***  Follow  Up Instructions: ***   I discussed the assessment and treatment plan with the patient. The patient was provided an opportunity to ask questions and all were answered. The patient agreed with the plan and demonstrated an understanding of the instructions.   The patient was advised to call back or seek an in-person evaluation if the symptoms worsen or if the condition fails to improve as anticipated.     I provided *** minutes total time during this encounter including median intraservice time, reviewing previous notes,  investigations, ordering medications, medical decision making, coordinating care and patient verbalized understanding at the end of the visit.    This note has been created with Education officer, environmental. Any transcriptional errors are unintentional.   Christine Siemens, NP 08/20/2023, 10:53 AM

## 2023-09-02 ENCOUNTER — Other Ambulatory Visit (HOSPITAL_BASED_OUTPATIENT_CLINIC_OR_DEPARTMENT_OTHER): Payer: Self-pay

## 2023-09-02 ENCOUNTER — Other Ambulatory Visit: Payer: Self-pay | Admitting: Family Medicine

## 2023-09-03 ENCOUNTER — Other Ambulatory Visit: Payer: Self-pay

## 2023-09-03 ENCOUNTER — Other Ambulatory Visit (HOSPITAL_BASED_OUTPATIENT_CLINIC_OR_DEPARTMENT_OTHER): Payer: Self-pay

## 2023-09-04 ENCOUNTER — Other Ambulatory Visit (HOSPITAL_BASED_OUTPATIENT_CLINIC_OR_DEPARTMENT_OTHER): Payer: Self-pay

## 2023-09-07 ENCOUNTER — Other Ambulatory Visit (HOSPITAL_BASED_OUTPATIENT_CLINIC_OR_DEPARTMENT_OTHER): Payer: Self-pay

## 2023-09-08 ENCOUNTER — Other Ambulatory Visit (HOSPITAL_BASED_OUTPATIENT_CLINIC_OR_DEPARTMENT_OTHER): Payer: Self-pay

## 2023-09-09 ENCOUNTER — Other Ambulatory Visit (HOSPITAL_BASED_OUTPATIENT_CLINIC_OR_DEPARTMENT_OTHER): Payer: Self-pay

## 2023-09-10 ENCOUNTER — Other Ambulatory Visit (HOSPITAL_BASED_OUTPATIENT_CLINIC_OR_DEPARTMENT_OTHER): Payer: Self-pay

## 2023-09-11 ENCOUNTER — Other Ambulatory Visit (HOSPITAL_BASED_OUTPATIENT_CLINIC_OR_DEPARTMENT_OTHER): Payer: Self-pay

## 2023-09-14 ENCOUNTER — Other Ambulatory Visit (HOSPITAL_BASED_OUTPATIENT_CLINIC_OR_DEPARTMENT_OTHER): Payer: Self-pay

## 2023-09-15 ENCOUNTER — Other Ambulatory Visit (HOSPITAL_BASED_OUTPATIENT_CLINIC_OR_DEPARTMENT_OTHER): Payer: Self-pay

## 2023-09-16 ENCOUNTER — Other Ambulatory Visit (HOSPITAL_BASED_OUTPATIENT_CLINIC_OR_DEPARTMENT_OTHER): Payer: Self-pay

## 2023-09-17 ENCOUNTER — Other Ambulatory Visit: Payer: Self-pay

## 2023-09-17 ENCOUNTER — Other Ambulatory Visit: Payer: Self-pay | Admitting: Nurse Practitioner

## 2023-09-17 ENCOUNTER — Other Ambulatory Visit (HOSPITAL_BASED_OUTPATIENT_CLINIC_OR_DEPARTMENT_OTHER): Payer: Self-pay

## 2023-09-17 ENCOUNTER — Telehealth (INDEPENDENT_AMBULATORY_CARE_PROVIDER_SITE_OTHER): Payer: Self-pay

## 2023-09-17 DIAGNOSIS — Z86711 Personal history of pulmonary embolism: Secondary | ICD-10-CM

## 2023-09-17 MED ORDER — RIVAROXABAN 20 MG PO TABS
20.0000 mg | ORAL_TABLET | Freq: Every day | ORAL | 0 refills | Status: DC
  Filled 2023-09-17 – 2023-10-07 (×2): qty 60, 60d supply, fill #0

## 2023-09-17 NOTE — Telephone Encounter (Signed)
 No refills of Trulicity  until she sees her PCP. She has not been consistently following up with Christine Singleton in order to get a refill for this.    I will refill xarelto  for 60 days only with no refills until seen by Cardiology on 11-04-2023.

## 2023-09-17 NOTE — Telephone Encounter (Signed)
 Copied from CRM [id number]. Topic: Clinical - Medication Prior Auth >> Sep 17, 2023 10:07 AM Delon DASEN wrote: Reason for CRM: empagliflozin  (JARDIANCE ) 10 MG TABS tablet- per pharmacy need PA

## 2023-09-17 NOTE — Telephone Encounter (Signed)
 Trulicity  was last filled 06/30/23 for a quantity of 28 days and Xarelto  was last filled 05/28/23 for 30 days by Jillian Buttery, MD   Please approve if refill is appropriate

## 2023-09-17 NOTE — Telephone Encounter (Signed)
 Copied from CRM [id number]. Topic: Clinical - Medication Question >> Sep 17, 2023 10:12 AM Delon DASEN wrote: Reason for CRM: Dulaglutide  (TRULICITY ) 0.75 MG/0.5ML SOAJ has been out of this medication for three months and out of blood thinners, need to speak with Rosaline Bohr on Monday- Please call [phone]

## 2023-09-21 ENCOUNTER — Other Ambulatory Visit (HOSPITAL_BASED_OUTPATIENT_CLINIC_OR_DEPARTMENT_OTHER): Payer: Self-pay

## 2023-09-21 NOTE — Telephone Encounter (Signed)
 Reached out to pt and went over covering provider response. Pt states she understands and pt has been scheduled a f/u with provider for 7/14

## 2023-09-22 ENCOUNTER — Other Ambulatory Visit (HOSPITAL_BASED_OUTPATIENT_CLINIC_OR_DEPARTMENT_OTHER): Payer: Self-pay

## 2023-09-23 ENCOUNTER — Other Ambulatory Visit (HOSPITAL_COMMUNITY): Payer: Self-pay

## 2023-09-23 ENCOUNTER — Other Ambulatory Visit (HOSPITAL_BASED_OUTPATIENT_CLINIC_OR_DEPARTMENT_OTHER): Payer: Self-pay

## 2023-09-24 ENCOUNTER — Other Ambulatory Visit (HOSPITAL_BASED_OUTPATIENT_CLINIC_OR_DEPARTMENT_OTHER): Payer: Self-pay

## 2023-09-25 ENCOUNTER — Other Ambulatory Visit (HOSPITAL_BASED_OUTPATIENT_CLINIC_OR_DEPARTMENT_OTHER): Payer: Self-pay

## 2023-09-28 ENCOUNTER — Ambulatory Visit (INDEPENDENT_AMBULATORY_CARE_PROVIDER_SITE_OTHER): Admitting: Primary Care

## 2023-09-28 ENCOUNTER — Other Ambulatory Visit (HOSPITAL_BASED_OUTPATIENT_CLINIC_OR_DEPARTMENT_OTHER): Payer: Self-pay

## 2023-09-29 ENCOUNTER — Other Ambulatory Visit (HOSPITAL_BASED_OUTPATIENT_CLINIC_OR_DEPARTMENT_OTHER): Payer: Self-pay

## 2023-09-30 ENCOUNTER — Other Ambulatory Visit (HOSPITAL_BASED_OUTPATIENT_CLINIC_OR_DEPARTMENT_OTHER): Payer: Self-pay

## 2023-10-01 ENCOUNTER — Other Ambulatory Visit (HOSPITAL_BASED_OUTPATIENT_CLINIC_OR_DEPARTMENT_OTHER): Payer: Self-pay

## 2023-10-02 ENCOUNTER — Other Ambulatory Visit (HOSPITAL_BASED_OUTPATIENT_CLINIC_OR_DEPARTMENT_OTHER): Payer: Self-pay

## 2023-10-03 ENCOUNTER — Other Ambulatory Visit (HOSPITAL_BASED_OUTPATIENT_CLINIC_OR_DEPARTMENT_OTHER): Payer: Self-pay

## 2023-10-05 ENCOUNTER — Other Ambulatory Visit (HOSPITAL_BASED_OUTPATIENT_CLINIC_OR_DEPARTMENT_OTHER): Payer: Self-pay

## 2023-10-06 ENCOUNTER — Other Ambulatory Visit (HOSPITAL_BASED_OUTPATIENT_CLINIC_OR_DEPARTMENT_OTHER): Payer: Self-pay

## 2023-10-07 ENCOUNTER — Other Ambulatory Visit (HOSPITAL_BASED_OUTPATIENT_CLINIC_OR_DEPARTMENT_OTHER): Payer: Self-pay

## 2023-10-08 ENCOUNTER — Other Ambulatory Visit (HOSPITAL_BASED_OUTPATIENT_CLINIC_OR_DEPARTMENT_OTHER): Payer: Self-pay

## 2023-10-09 ENCOUNTER — Other Ambulatory Visit (HOSPITAL_BASED_OUTPATIENT_CLINIC_OR_DEPARTMENT_OTHER): Payer: Self-pay

## 2023-10-12 ENCOUNTER — Ambulatory Visit (INDEPENDENT_AMBULATORY_CARE_PROVIDER_SITE_OTHER): Admitting: Primary Care

## 2023-10-19 ENCOUNTER — Other Ambulatory Visit (HOSPITAL_BASED_OUTPATIENT_CLINIC_OR_DEPARTMENT_OTHER): Payer: Self-pay

## 2023-10-21 ENCOUNTER — Encounter (HOSPITAL_COMMUNITY): Payer: Self-pay | Admitting: Cardiology

## 2023-10-21 ENCOUNTER — Other Ambulatory Visit (HOSPITAL_BASED_OUTPATIENT_CLINIC_OR_DEPARTMENT_OTHER): Payer: Self-pay

## 2023-10-21 ENCOUNTER — Telehealth: Payer: Self-pay | Admitting: Pharmacy Technician

## 2023-10-21 ENCOUNTER — Ambulatory Visit (HOSPITAL_COMMUNITY)
Admission: RE | Admit: 2023-10-21 | Discharge: 2023-10-21 | Disposition: A | Discharge status: Home / Self Care | Source: Ambulatory Visit | Attending: Cardiology | Admitting: Cardiology

## 2023-10-21 VITALS — BP 134/62 | HR 106 | Ht 62.0 in | Wt 354.8 lb

## 2023-10-21 DIAGNOSIS — I5022 Chronic systolic (congestive) heart failure: Secondary | ICD-10-CM | POA: Diagnosis present

## 2023-10-21 DIAGNOSIS — Z86711 Personal history of pulmonary embolism: Secondary | ICD-10-CM

## 2023-10-21 MED ORDER — RIVAROXABAN 20 MG PO TABS
20.0000 mg | ORAL_TABLET | Freq: Every day | ORAL | 11 refills | Status: AC
  Filled 2023-10-21: qty 60, 60d supply, fill #0
  Filled 2024-04-03: qty 60, 60d supply, fill #1

## 2023-10-21 MED ORDER — EMPAGLIFLOZIN 10 MG PO TABS
10.0000 mg | ORAL_TABLET | Freq: Every day | ORAL | 3 refills | Status: DC
  Filled 2023-10-21 – 2023-10-26 (×2): qty 90, 90d supply, fill #0

## 2023-10-21 NOTE — Telephone Encounter (Signed)
 Pharmacy Patient Advocate Encounter   Received notification from CoverMyMeds that prior authorization for JARDIANCE  10MG  is required/requested.   Insurance verification completed.   The patient is insured through [facility] .   Per test claim: PA required; PA submitted to above mentioned insurance via LATENT Key/confirmation #/EOC [id number] Status is pending

## 2023-10-21 NOTE — Patient Instructions (Signed)
 There has been no changes to your medications.  Your physician recommends that you schedule a follow-up appointment in: as needed.  At the [facility], you and your health needs are our priority. As part of our continuing mission to provide you with exceptional heart care, we have created designated Provider Care Teams. These Care Teams include your primary Cardiologist (physician) and Advanced Practice Providers (APPs- Physician Assistants and Nurse Practitioners) who all work together to provide you with the care you need, when you need it.   You may see any of the following providers on your designated Care Team at your next follow up: Dr Toribio Fuel Dr Ezra Shuck Dr. Ria Commander Dr. Morene Brownie Amy Lenetta, NP Caffie Shed, [place] [facility] [place], [place] Beckey Coe, NP [place] Lee, NP Ellouise Class, NP Tinnie Redman, PharmD Jaun Bash, PharmD   Please be sure to bring in all your medications bottles to every appointment.    Thank you for choosing [facility]

## 2023-10-21 NOTE — Progress Notes (Signed)
   ADVANCED HEART FAILURE NEW PATIENT CLINIC NOTE  Referring Physician: Celestia Rosaline SQUIBB, NP  Primary Care: Celestia Rosaline SQUIBB, NP Primary Cardiologist:  HPI: Christine Singleton is a 33 y.o. female with a PMH of morbid obesity, HFmrEF, OSA who presents for initial visit for further evaluation and treatment of heart failure/cardiomyopathy.      Patient has followed with general cardiology in the past for a history of HFmrEF/HFpEF and has been seen in the emergency room recently with acute on chronic heart failure exacerbation. Last admission in April of 2025, was referred to AHF clinic.      SUBJECTIVE:  Patient overall reports that she is doing fair. She is working currently and reports her breathing is relatively stable, short of breath with more than moderate exertion. No significant orthopnea, but sleeps with BiPAP. Notes some lower extremity swelling, better on the diuretics. Denies any active chest pain. Waiting on refills of her GLP-1 from her PCP.   PMH, current medications, allergies, social history, and family history reviewed in epic.  PHYSICAL EXAM: Vitals:   10/21/23 1150  BP: 134/62  Pulse: (!) 106  SpO2: 97%   GENERAL: Well nourished and in no apparent distress at rest. Morbidly obese PULM:  Normal work of breathing, clear to auscultation bilaterally. Respirations are unlabored.  CARDIAC:  JVP: difficult to visualize         Normal rate with regular rhythm. No murmurs, rubs or gallops.  Trace edema. Warm and well perfused extremities. ABDOMEN: Soft, non-tender, non-distended. NEUROLOGIC: Patient is oriented x3 with no focal or lateralizing neurologic deficits.     ASSESSMENT & PLAN:  Chronic heart failure with preserved EF: Mildly hypervolemic today, though improved on loop diuretics. Suspect etiology primarily cardiometabolic, discussed need for weight loss and OSA therapy compliance. No ischemic evaluation, but given that she has primarily HFpEF physiology and  is without anginal symptoms can likely defer at this time.  - Start jardiance  10mg  daily, UTI risks discussed - Continue spironolactone  25mg  daily - Continue torsemide  20mg  daily - Weight loss as below - No need for further advanced heart failure clinic follow up, RV appears normal, no significant PH. Can refer back if any further clinical issues  Pulmonary embolism: - Noted in 2024, no evidence of Right heart strain, none on most recent study - Continue xarelto  20mg  daily  Hypertension: - Mildly elevated in clinic today - Management as above  Morbid obesity:  - Would benefit from continued GLP-1 use, follow up with PCP to get back on Trulicity   Sleep apnea:  - Continue BiPAP therapy, compliant    Morene Brownie, MD Advanced [facility] Failure Mechanical Circulatory Support 10/26/23

## 2023-10-22 ENCOUNTER — Other Ambulatory Visit (HOSPITAL_BASED_OUTPATIENT_CLINIC_OR_DEPARTMENT_OTHER): Payer: Self-pay

## 2023-10-22 NOTE — Telephone Encounter (Signed)
 There are notes for jardiance  being prescribed for heart failure and diabetes. First prior auth I submitted I submitted with heart failure diag but it was denied saying there is insufficient data showing jardiance  helped improve the heart failure. Last echo was 01/2023 and it has the same 45 to 50% as shown in 08/15/22. So, I submitted another prior auth for jardiance  with diabetes diag and sent in labs showing her A1c has improved from 7.3 to 6.6.   Key [id number]

## 2023-10-22 NOTE — Telephone Encounter (Signed)
 Pharmacy Patient Advocate Encounter  Received notification from [facility] that Prior Authorization for jardiance  has been APPROVED from 10/22/23 to 10/21/24   PA #/Case ID/Reference #: [id number]    Approved with diabetes diagnosis

## 2023-10-26 ENCOUNTER — Other Ambulatory Visit (HOSPITAL_BASED_OUTPATIENT_CLINIC_OR_DEPARTMENT_OTHER): Payer: Self-pay

## 2023-10-26 MED ORDER — SPIRONOLACTONE 25 MG PO TABS
25.0000 mg | ORAL_TABLET | Freq: Every day | ORAL | 11 refills | Status: DC
  Filled 2023-10-26: qty 30, 30d supply, fill #0

## 2023-11-02 NOTE — Progress Notes (Deleted)
 Cardiology Clinic Note   Patient Name: Christine Singleton Date of Encounter: 11/02/2023  Primary Care Provider:  Celestia Rosaline SQUIBB, NP Primary Cardiologist:  Alvan Ronal BRAVO, MD (Inactive)  Patient Profile    Christine Singleton 33 year old female presents to the clinic today for follow-up evaluation of her chronic diastolic CHF and essential hypertension.  Past Medical History    Past Medical History:  Diagnosis Date   CHF (congestive heart failure) (HCC)    Gestational diabetes    With first pregnancy   History of cesarean delivery 04/23/2016   Hypertension    PE (pulmonary thromboembolism) (HCC)    Sleep apnea    Past Surgical History:  Procedure Laterality Date   CESAREAN SECTION     CESAREAN SECTION N/A 02/03/2016   Procedure: CESAREAN SECTION;  Surgeon: Burnard VEAR Pate, MD;  Location: [facility];  Service: Obstetrics;  Laterality: N/A;    Allergies  Allergies  Allergen Reactions   Strawberry Extract Anaphylaxis    History of Present Illness    Christine Singleton has a PMH of HFmrEF, OSA, history of PE, morbid obesity, iron deficiency anemia, hypovolemia, and essential hypertension.  She was referred to cardiology for an evaluation of her reduced EF 45-50%.  She was not noted to have RV dysfunction or pulmonary hypertension.  She was diagnosed with PE 11/24.  She was placed on Eliquis .  She was noted to have obstructive sleep apnea and was using BiPAP.  She was seen in the emergency department 2/25 with shortness of breath.  She was seen in follow-up by Dr. Alvan 05/08/2023.  During that time it was felt that her EF was closer to 50%.  She was noted to have hypokinesis of the inferior septal wall.  Her EKG showed sinus tachycardia.  Nuclear stress testing was deferred.  Her reduced EF was not felt to be related to ischemia.  Her Jardiance  was continued as well as spironolactone  and furosemide .  Follow-up in 6 months was planned.  She was seen in follow-up by Dr.  Zenaida 10/21/2023.  During that time she reported that her breathing was stable.  She was noted to be short of breath with more than moderate exertion.  She denied orthopnea and was compliant with her BiPAP.  She was noted to have some lower extremity swelling.  She continued her diuretics.  She denied chest pain.  She was waiting on refills for her GLP-1 from her primary care provider.  She presents to the clinic today for follow-up evaluation and states***.  *** denies chest pain, shortness of breath, lower extremity edema, fatigue, palpitations, melena, hematuria, hemoptysis, diaphoresis, weakness, presyncope, syncope, orthopnea, and PND.   HFpEF-denies increased DOE or increased activity intolerance.  Weight today***.  Echocardiogram 1124 showed LVEF of 45-50%, normal diastolic parameters, mildly reduced RV function, and trivial tricuspid valve regurgitation. Heart healthy low-sodium diet Daily weights Continue Jardiance , spironolactone , torsemide  Lower extremity support stockings Elevate lower extremities when not active Following with advanced heart failure team  Hypertension-BP today***. Maintain blood pressure log Continue spironolactone , torsemide   OSA-reports compliance with BiPAP. Avoid supine sleeping Sleep hygiene instructions Continue weight loss  History of PE-reports compliance with Xarelto .  Denies bleeding issues. Following with PCP  Morbid obesity-weight today***. Reduced calorie diet Increase physical activity as tolerated Continue GLP-1  History of PE-reassuring echocardiogram 11/24.  Reports compliance with Xarelto .  Denies bleeding issues. Continue Xarelto   Disposition: Follow-up with Dr. Sheena or me in 6-9 months.  Home Medications  Prior to Admission medications   Medication Sig Start Date End Date Taking? Authorizing Provider  Dulaglutide  (TRULICITY ) 0.75 MG/0.5ML SOAJ Inject 0.75 mg into the skin every Monday. Patient not taking: Reported on  10/21/2023 07/13/23   Amin, Sumayya, MD  empagliflozin  (JARDIANCE ) 10 MG TABS tablet Take 1 tablet (10 mg total) by mouth daily. 10/21/23   Zenaida Morene PARAS, MD  guaiFENesin  (MUCINEX ) 600 MG 12 hr tablet Take 600 mg by mouth 2 (two) times daily as needed for cough or to loosen phlegm.    [provider]  metFORMIN  (GLUCOPHAGE ) 500 MG tablet Take 1 tablet (500 mg total) by mouth daily with breakfast. 05/31/23   Celestia Rosaline SQUIBB, NP  OXYGEN Inhale 3 L/min into the lungs at bedtime.    [provider]  PRESCRIPTION MEDICATION See admin instructions. CPAP- At bedtime    [provider]  rivaroxaban  (XARELTO ) 20 MG TABS tablet Take 1 tablet (20 mg total) by mouth daily. 10/21/23   Zenaida Morene PARAS, MD  spironolactone  (ALDACTONE ) 25 MG tablet Take 1 tablet (25 mg total) by mouth daily. 10/26/23 11/28/23  Zenaida Morene PARAS, MD  torsemide  (DEMADEX ) 20 MG tablet Take 1 tablet (20 mg total) by mouth daily. 07/08/23   Amin, Sumayya, MD  TYLENOL  500 MG tablet Take 1,000 mg by mouth every 6 (six) hours as needed (for headaches).    [provider]    Family History    Family History  Problem Relation Age of Onset   Migraines Mother    Hypertension Mother    Hyperlipidemia Mother    Hypertension Father    Hyperlipidemia Father    Asthma Sister    Cancer Neg Hx    Diabetes Neg Hx    She indicated that her mother is alive. She indicated that her father is alive. She indicated that the status of her sister is unknown. She indicated that the status of her neg hx is unknown.  Social History    Social History   Socioeconomic History   Marital status: Single    Spouse name: Not on file   Number of children: 2   Years of education: Not on file   Highest education level: High school graduate  Occupational History   Occupation: [employer]  Tobacco Use   Smoking status: Never   Smokeless tobacco: Never  Vaping Use   Vaping status: Never Used  Substance and Sexual  Activity   Alcohol use: No   Drug use: No    Types: Marijuana    Comment: before admission, smokes everyday   Sexual activity: Yes    Birth control/protection: None  Other Topics Concern   Not on file  Social History Narrative   Not on file   Social Drivers of Health   Financial Resource Strain: High Risk (09/08/2022)   Overall Financial Resource Strain (CARDIA)    Difficulty of Paying Living Expenses: Hard  Food Insecurity: No Food Insecurity (07/07/2023)   Hunger Vital Sign    Worried About Running Out of Food in the Last Year: Never true    Ran Out of Food in the Last Year: Never true  Transportation Needs: No Transportation Needs (07/07/2023)   PRAPARE - Administrator, Civil Service (Medical): No    Lack of Transportation (Non-Medical): No  Physical Activity: Not on file  Stress: Not on file  Social Connections: Not on file  Intimate Partner Violence: Not At Risk (07/07/2023)   Humiliation, Afraid, Rape, and Kick  questionnaire    Fear of Current or Ex-Partner: No    Emotionally Abused: No    Physically Abused: No    Sexually Abused: No     Review of Systems    General:  No chills, fever, night sweats or weight changes.  Cardiovascular:  No chest pain, dyspnea on exertion, edema, orthopnea, palpitations, paroxysmal nocturnal dyspnea. Dermatological: No rash, lesions/masses Respiratory: No cough, dyspnea Urologic: No hematuria, dysuria Abdominal:   No nausea, vomiting, diarrhea, bright red blood per rectum, melena, or hematemesis Neurologic:  No visual changes, wkns, changes in mental status. All other systems reviewed and are otherwise negative except as noted above.  Physical Exam    VS:  There were no vitals taken for this visit. , BMI There is no height or weight on file to calculate BMI. GEN: Well nourished, well developed, in no acute distress. HEENT: normal. Neck: Supple, no JVD, carotid bruits, or masses. Cardiac: RRR, no murmurs, rubs, or  gallops. No clubbing, cyanosis, edema.  Radials/DP/PT 2+ and equal bilaterally.  Respiratory:  Respirations regular and unlabored, clear to auscultation bilaterally. GI: Soft, nontender, nondistended, BS + x 4. MS: no deformity or atrophy. Skin: warm and dry, no rash. Neuro:  Strength and sensation are intact. Psych: Normal affect.  Accessory Clinical Findings    Recent Labs: 04/13/2023: ALT 18 07/06/2023: B Natriuretic Peptide 78.8 07/08/2023: BUN 11; Creatinine, Ser 0.68; Hemoglobin 14.4; Platelets 360; Potassium 4.0; Sodium 137   Recent Lipid Panel No results found for: CHOL, TRIG, HDL, CHOLHDL, VLDL, LDLCALC, LDLDIRECT  No BP recorded.  {Refresh Note OR Click here to enter BP  :1}***    ECG personally reviewed by me today- ***    Echocardiogram 01/27/2023   IMPRESSIONS     1. Left ventricular ejection fraction, by estimation, is 45 to 50%. The  left ventricle has mildly decreased function. The left ventricle  demonstrates global hypokinesis. Left ventricular diastolic parameters  were normal.   2. Right ventricular systolic function is mildly reduced. The right  ventricular size is normal. Tricuspid regurgitation signal is inadequate  for assessing PA pressure.   3. The mitral valve is normal in structure. No evidence of mitral valve  regurgitation. No evidence of mitral stenosis.   4. The aortic valve is normal in structure. Aortic valve regurgitation is  not visualized. No aortic stenosis is present.   5. The inferior vena cava is normal in size with greater than 50%  respiratory variability, suggesting right atrial pressure of 3 mmHg.   FINDINGS   Left Ventricle: Left ventricular ejection fraction, by estimation, is 45  to 50%. The left ventricle has mildly decreased function. The left  ventricle demonstrates global hypokinesis. The left ventricular internal  cavity size was normal in size. There is   no left ventricular hypertrophy. Left ventricular  diastolic parameters  were normal. Normal left ventricular filling pressure.   Right Ventricle: The right ventricular size is normal. No increase in  right ventricular wall thickness. Right ventricular systolic function is  mildly reduced. Tricuspid regurgitation signal is inadequate for assessing  PA pressure.   Left Atrium: Left atrial size was normal in size.   Right Atrium: Right atrial size was normal in size.   Pericardium: There is no evidence of pericardial effusion.   Mitral Valve: The mitral valve is normal in structure. No evidence of  mitral valve regurgitation. No evidence of mitral valve stenosis.   Tricuspid Valve: The tricuspid valve is normal in structure. Tricuspid  valve regurgitation is trivial. No evidence of tricuspid stenosis.   Aortic Valve: The aortic valve is normal in structure. Aortic valve  regurgitation is not visualized. No aortic stenosis is present.   Pulmonic Valve: The pulmonic valve was normal in structure. Pulmonic valve  regurgitation is not visualized. No evidence of pulmonic stenosis.   Aorta: The aortic root is normal in size and structure.   Venous: The inferior vena cava is normal in size with greater than 50%  respiratory variability, suggesting right atrial pressure of 3 mmHg.   IAS/Shunts: No atrial level shunt detected by color flow Doppler.       Assessment & Plan   1.  ***   Christine HERO. Marcelino Campos NP-C     11/02/2023, 7:39 AM [facility] [address] [place], [place] [address] Office [phone]      I spent***minutes examining this patient, reviewing medications, and using patient centered shared decision making involving their cardiac care.   I spent  20 minutes reviewing past medical history,  medications, and prior cardiac tests.

## 2023-11-04 ENCOUNTER — Ambulatory Visit: Admitting: General Practice

## 2023-11-14 ENCOUNTER — Telehealth: Payer: Self-pay | Admitting: Cardiology

## 2023-11-14 NOTE — Telephone Encounter (Signed)
 Patient called in reporting concerns over her gums bleeding yesterday.  States that she went to bed last evening and woke up noticing that her gums were bleeding late last night.  Also noted a small clot when she rinsed her mouth out.  Was able to go back to bed, this morning she had very minimal bleeding and has essentially stopped.  I advised that it would be okay to hold this evening's dose and follow her symptoms with plans to resume tomorrow pending no further bleeding.  She voiced understanding and thanked me for call back.  She will reach out again if she has further issues.

## 2023-11-18 ENCOUNTER — Inpatient Hospital Stay (HOSPITAL_BASED_OUTPATIENT_CLINIC_OR_DEPARTMENT_OTHER)
Admission: EM | Admit: 2023-11-18 | Discharge: 2023-11-20 | DRG: 291 | Disposition: A | Discharge status: Home / Self Care | Attending: Internal Medicine | Admitting: Internal Medicine

## 2023-11-18 ENCOUNTER — Emergency Department (HOSPITAL_BASED_OUTPATIENT_CLINIC_OR_DEPARTMENT_OTHER)

## 2023-11-18 ENCOUNTER — Encounter (HOSPITAL_BASED_OUTPATIENT_CLINIC_OR_DEPARTMENT_OTHER): Payer: Self-pay

## 2023-11-18 ENCOUNTER — Other Ambulatory Visit: Payer: Self-pay

## 2023-11-18 DIAGNOSIS — G4733 Obstructive sleep apnea (adult) (pediatric): Secondary | ICD-10-CM | POA: Diagnosis present

## 2023-11-18 DIAGNOSIS — Z79899 Other long term (current) drug therapy: Secondary | ICD-10-CM

## 2023-11-18 DIAGNOSIS — Z825 Family history of asthma and other chronic lower respiratory diseases: Secondary | ICD-10-CM

## 2023-11-18 DIAGNOSIS — E66813 Obesity, class 3: Secondary | ICD-10-CM | POA: Diagnosis present

## 2023-11-18 DIAGNOSIS — D75838 Other thrombocytosis: Secondary | ICD-10-CM | POA: Diagnosis present

## 2023-11-18 DIAGNOSIS — I10 Essential (primary) hypertension: Secondary | ICD-10-CM | POA: Diagnosis present

## 2023-11-18 DIAGNOSIS — Z6841 Body Mass Index (BMI) 40.0 and over, adult: Secondary | ICD-10-CM

## 2023-11-18 DIAGNOSIS — D509 Iron deficiency anemia, unspecified: Secondary | ICD-10-CM | POA: Diagnosis present

## 2023-11-18 DIAGNOSIS — I110 Hypertensive heart disease with heart failure: Principal | ICD-10-CM | POA: Diagnosis present

## 2023-11-18 DIAGNOSIS — I2699 Other pulmonary embolism without acute cor pulmonale: Secondary | ICD-10-CM | POA: Diagnosis not present

## 2023-11-18 DIAGNOSIS — Z86711 Personal history of pulmonary embolism: Secondary | ICD-10-CM | POA: Diagnosis not present

## 2023-11-18 DIAGNOSIS — Z8249 Family history of ischemic heart disease and other diseases of the circulatory system: Secondary | ICD-10-CM | POA: Diagnosis not present

## 2023-11-18 DIAGNOSIS — I509 Heart failure, unspecified: Secondary | ICD-10-CM | POA: Insufficient documentation

## 2023-11-18 DIAGNOSIS — E119 Type 2 diabetes mellitus without complications: Secondary | ICD-10-CM

## 2023-11-18 DIAGNOSIS — Z7984 Long term (current) use of oral hypoglycemic drugs: Secondary | ICD-10-CM | POA: Diagnosis not present

## 2023-11-18 DIAGNOSIS — Z7901 Long term (current) use of anticoagulants: Secondary | ICD-10-CM | POA: Diagnosis not present

## 2023-11-18 DIAGNOSIS — E1165 Type 2 diabetes mellitus with hyperglycemia: Secondary | ICD-10-CM | POA: Diagnosis present

## 2023-11-18 DIAGNOSIS — J9621 Acute and chronic respiratory failure with hypoxia: Secondary | ICD-10-CM | POA: Diagnosis present

## 2023-11-18 DIAGNOSIS — Z83438 Family history of other disorder of lipoprotein metabolism and other lipidemia: Secondary | ICD-10-CM

## 2023-11-18 DIAGNOSIS — E8770 Fluid overload, unspecified: Secondary | ICD-10-CM

## 2023-11-18 DIAGNOSIS — I5033 Acute on chronic diastolic (congestive) heart failure: Secondary | ICD-10-CM | POA: Diagnosis present

## 2023-11-18 DIAGNOSIS — Z1152 Encounter for screening for COVID-19: Secondary | ICD-10-CM | POA: Diagnosis not present

## 2023-11-18 LAB — PREGNANCY, URINE: Preg Test, Ur: NEGATIVE

## 2023-11-18 LAB — CBC
HCT: 42 % (ref 36.0–46.0)
Hemoglobin: 13.8 g/dL (ref 12.0–15.0)
MCH: 23.9 pg — ABNORMAL LOW (ref 26.0–34.0)
MCHC: 32.9 g/dL (ref 30.0–36.0)
MCV: 72.8 fL — ABNORMAL LOW (ref 80.0–100.0)
Platelets: 428 K/uL — ABNORMAL HIGH (ref 150–400)
RBC: 5.77 MIL/uL — ABNORMAL HIGH (ref 3.87–5.11)
RDW: 18.2 % — ABNORMAL HIGH (ref 11.5–15.5)
WBC: 12.8 K/uL — ABNORMAL HIGH (ref 4.0–10.5)
nRBC: 0 % (ref 0.0–0.2)

## 2023-11-18 LAB — BASIC METABOLIC PANEL WITH GFR
Anion gap: 10 (ref 5–15)
BUN: 9 mg/dL (ref 6–20)
CO2: 28 mmol/L (ref 22–32)
Calcium: 9 mg/dL (ref 8.9–10.3)
Chloride: 99 mmol/L (ref 98–111)
Creatinine, Ser: 0.78 mg/dL (ref 0.44–1.00)
GFR, Estimated: >60 mL/min (ref >60)
Glucose, Bld: 155 mg/dL — ABNORMAL HIGH (ref 70–99)
Potassium: 4 mmol/L (ref 3.5–5.1)
Sodium: 137 mmol/L (ref 135–145)

## 2023-11-18 LAB — RESP PANEL BY RT-PCR (RSV, FLU A&B, COVID)  RVPGX2
Influenza A by PCR: NEGATIVE
Influenza B by PCR: NEGATIVE
Resp Syncytial Virus by PCR: NEGATIVE
SARS Coronavirus 2 by RT PCR: NEGATIVE

## 2023-11-18 LAB — TROPONIN T, HIGH SENSITIVITY: Troponin T High Sensitivity: <15 ng/L (ref 0–19)

## 2023-11-18 LAB — D-DIMER, QUANTITATIVE: D-Dimer, Quant: 0.29 ug{FEU}/mL (ref 0.00–0.50)

## 2023-11-18 LAB — PRO BRAIN NATRIURETIC PEPTIDE: Pro Brain Natriuretic Peptide: 193 pg/mL (ref <300.0)

## 2023-11-18 MED ORDER — INSULIN ASPART 100 UNIT/ML IJ SOLN: 0.0000 [IU] | Freq: Every day | INTRAMUSCULAR | Status: DC

## 2023-11-18 MED ORDER — FLUTICASONE PROPIONATE 50 MCG/ACT NA SUSP
2.0000 | Freq: Every day | NASAL | Status: DC
  Administered 2023-11-19 (×2): 2 via NASAL
  Filled 2023-11-18: qty 16

## 2023-11-18 MED ORDER — INSULIN ASPART 100 UNIT/ML IJ SOLN
0.0000 [IU] | Freq: Three times a day (TID) | INTRAMUSCULAR | Status: DC
  Administered 2023-11-19 – 2023-11-20 (×3): 1 [IU] via SUBCUTANEOUS

## 2023-11-18 MED ORDER — FUROSEMIDE 10 MG/ML IJ SOLN
20.0000 mg | Freq: Four times a day (QID) | INTRAMUSCULAR | Status: DC
  Administered 2023-11-19 (×3): 20 mg via INTRAVENOUS
  Filled 2023-11-18 (×3): qty 2

## 2023-11-18 MED ORDER — FUROSEMIDE 10 MG/ML IJ SOLN
40.0000 mg | Freq: Once | INTRAMUSCULAR | Status: AC
Start: 2023-11-18 — End: 2023-11-18
  Administered 2023-11-18: 40 mg via INTRAVENOUS
  Filled 2023-11-18: qty 4

## 2023-11-18 MED ORDER — RIVAROXABAN 20 MG PO TABS
20.0000 mg | ORAL_TABLET | Freq: Every day | ORAL | Status: DC
  Administered 2023-11-19 – 2023-11-20 (×2): 20 mg via ORAL
  Filled 2023-11-18: qty 1

## 2023-11-18 NOTE — Progress Notes (Signed)
 Plan of Care Note for accepted transfer   Patient: Christine Singleton MRN: [id number]   DOA: 11/18/2023  Facility requesting transfer: [facility] Requesting Provider: PA Sidra Ruby Reason for transfer: admit Facility course: Patient with  h/o obesity, CHF with preserved EF and PE on Eliquis , followed by heart failure clinic presents w sob and weight gain of 15lbs despite doubling Torsemide .  She is hypoxic w sats in the mid 80s while sleeping. She does wear O2 at night but requiring 4L in ED.  Plan of care: The patient is accepted for admission to Telemetry unit, at [facility]..  No chest pain but Ddimer ordered and added to sample in lab.  Author: ARTHEA CHILD, MD 11/18/2023  Check [email or web address] for on-call coverage.  Nursing staff, Please call [facility] Admits & Consults System-Wide number on Amion as soon as patient's arrival, so appropriate admitting provider can evaluate the pt.

## 2023-11-18 NOTE — ED Notes (Signed)
 O2 increased to 4L [place] d/t pt desaturating to low 80s, pt easily arousable, RR equal and unlabored. Awaiting dispo. RT made aware.

## 2023-11-18 NOTE — ED Triage Notes (Signed)
 Reports SHOB, mid chest pain, muscle aches, cough, sore throat, fatigue for 1 week.  Pt out of breath after walking to triage room. O2 sat 98%. Tachypnea stopped after sitting in triage.

## 2023-11-18 NOTE — ED Notes (Signed)
 Upon ambulation to room, patients SpO2 dropped to 87%, visibly short of breath and increased work of breathing. SpO2 recovered slowly once sitting, placed on 2L O2 via [place] with improvement

## 2023-11-18 NOTE — ED Provider Notes (Signed)
 [facility] Provider Note   CSN: [id number] Arrival date & time: 11/18/23  1537     Patient presents with: Shortness of Breath and Chest Pain   Christine Singleton is a 33 y.o. female.   Patient with medical history significant for type 2 diabetes, hypertension, PE on Xarelto , OSA on nightly BiPAP, heart failure with reduced EF, admitted 06/2023 for hypoxic respiratory failure likely due to heart failure exacerbation with worsening exertional dyspnea and bilateral lower extremity edema --symptoms have been worsening over the past 1 week.  Patient reports approximately 15 pound weight gain.  She tried doubling up her diuretic and initially thought it was working however her breathing continued to get worse.  She uses oxygen at night only.  She reports compliance with her anticoagulation and denies any missed doses.  She denies any focal abdominal pain or worsening abdominal pain.  No fevers.  She has had increased cough.  She reports orthopnea and worsening shortness of breath while lying flat.  Mild increase in lower extremity edema.  Per nursing note, oxygen saturation dropped to 87% and patient visually short of breath with ambulation to the exam room.  She was placed on 2 L nasal cannula with improvement.       Prior to Admission medications   Medication Sig Start Date End Date Taking? Authorizing Provider  Dulaglutide  (TRULICITY ) 0.75 MG/0.5ML SOAJ Inject 0.75 mg into the skin every Monday. Patient not taking: Reported on 10/21/2023 07/13/23   Amin, Sumayya, MD  empagliflozin  (JARDIANCE ) 10 MG TABS tablet Take 1 tablet (10 mg total) by mouth daily. 10/21/23   Zenaida Morene PARAS, MD  guaiFENesin  (MUCINEX ) 600 MG 12 hr tablet Take 600 mg by mouth 2 (two) times daily as needed for cough or to loosen phlegm.    [provider]  metFORMIN  (GLUCOPHAGE ) 500 MG tablet Take 1 tablet (500 mg total) by mouth daily with breakfast. 05/31/23   Celestia Rosaline SQUIBB, NP  OXYGEN Inhale 3 L/min into the lungs at bedtime.    [provider]  PRESCRIPTION MEDICATION See admin instructions. CPAP- At bedtime    [provider]  rivaroxaban  (XARELTO ) 20 MG TABS tablet Take 1 tablet (20 mg total) by mouth daily. 10/21/23   Zenaida Morene PARAS, MD  spironolactone  (ALDACTONE ) 25 MG tablet Take 1 tablet (25 mg total) by mouth daily. 10/26/23 11/28/23  Zenaida Morene PARAS, MD  torsemide  (DEMADEX ) 20 MG tablet Take 1 tablet (20 mg total) by mouth daily. 07/08/23   Amin, Sumayya, MD  TYLENOL  500 MG tablet Take 1,000 mg by mouth every 6 (six) hours as needed (for headaches).    [provider]    Allergies: Strawberry extract    Review of Systems  Updated Vital Signs BP 127/82 (BP Location: Right Arm)   Pulse (!) 106   Temp 99 F (37.2 C)   Resp 20   SpO2 98%   Physical Exam Vitals and nursing note reviewed.  Constitutional:      General: She is not in acute distress.    Appearance: She is well-developed.  HENT:     Head: Normocephalic and atraumatic.     Right Ear: External ear normal.     Left Ear: External ear normal.     Nose: Nose normal.  Eyes:     Conjunctiva/sclera: Conjunctivae normal.  Cardiovascular:     Rate and Rhythm: Normal rate and regular rhythm.     Heart sounds: No murmur  heard. Pulmonary:     Effort: No respiratory distress.     Breath sounds: Examination of the right-lower field reveals rales. Examination of the left-lower field reveals rales. Rales (Mild) present. No wheezing or rhonchi.     Comments: Patient currently appears comfortable on 2 L nasal cannula Abdominal:     Palpations: Abdomen is soft.     Tenderness: There is no abdominal tenderness. There is no guarding or rebound.  Musculoskeletal:     Cervical back: Normal range of motion and neck supple.     Right lower leg: Edema present.     Left lower leg: Edema present.  Skin:    General: Skin is warm and dry.     Findings: No rash.   Neurological:     General: No focal deficit present.     Mental Status: She is alert. Mental status is at baseline.     Motor: No weakness.  Psychiatric:        Mood and Affect: Mood normal.     (all labs ordered are listed, but only abnormal results are displayed) Labs Reviewed  BASIC METABOLIC PANEL WITH GFR - Abnormal; Notable for the following components:      Result Value   Glucose, Bld 155 (*)    All other components within normal limits  CBC - Abnormal; Notable for the following components:   WBC 12.8 (*)    RBC 5.77 (*)    MCV 72.8 (*)    MCH 23.9 (*)    RDW 18.2 (*)    Platelets 428 (*)    All other components within normal limits  RESP PANEL BY RT-PCR (RSV, FLU A&B, COVID)  RVPGX2  PREGNANCY, URINE  PRO BRAIN NATRIURETIC PEPTIDE  TROPONIN T, HIGH SENSITIVITY  TROPONIN T, HIGH SENSITIVITY    EKG: EKG Interpretation Date/Time:  Wednesday November 18 2023 15:45:32 EDT Ventricular Rate:  105 PR Interval:  146 QRS Duration:  79 QT Interval:  326 QTC Calculation: 431 R Axis:   101  Text Interpretation: Sinus tachycardia RAE, consider biatrial enlargement Right ventricular hypertrophy Borderline T abnormalities, diffuse leads No significant change since last tracing Confirmed by Zackowski, Scott [phone]) on 11/18/2023 3:54:39 PM  Radiology: [facility] Chest 2 View Result Date: 11/18/2023 CLINICAL DATA:  Chest pain.  Cough. EXAM: CHEST - 2 VIEW COMPARISON:  07/06/2023. FINDINGS: Stable cardiomegaly with suspected central pulmonary vascular congestion. No focal consolidation, pleural effusion, or pneumothorax. No acute osseous abnormality. IMPRESSION: Cardiomegaly with suspected central pulmonary vascular congestion. Electronically Signed   By: Harrietta Sherry M.D.   On: 11/18/2023 16:33     Procedures   Medications Ordered in the ED  furosemide  (LASIX ) injection 40 mg (has no administration in time range)   ED Course  Patient seen and examined. History obtained  directly from patient.  Reviewed notes from recent ED evaluation, imaging, hospitalization.  Labs/EKG: Labs ordered in triage personally reviewed and interpreted including CBC with normal hemoglobin, mildly elevated white blood cell count at 12.8; BMP with glucose 155, normal renal function; troponin less than 15; pregnancy negative; COVID-negative.  Added BNP.  Imaging: Chest x-ray personally reviewed and interpreted, agree vascular congestion noted  Medications/Fluids: Ordered: 40 mg IV furosemide   Most recent vital signs reviewed and are as follows: BP 127/82 (BP Location: Right Arm)   Pulse (!) 106   Temp 99 F (37.2 C)   Resp 20   SpO2 98%   Initial impression: Shortness of breath and hypoxia, likely related to fluid  overload.  Patient reports compliance with anticoagulation.  She has edema, increased weight, orthopnea consistent with decompensated heart failure.  Per nursing note, oxygen saturation dropped to 87% and patient visually short of breath with ambulation to the exam room.  She was placed on 2 L nasal cannula with improvement.  7:01 PM Reassessment performed. Patient appears comfortable, currently on 4 L nasal cannula.  Labs personally reviewed and interpreted including: BNP is normal, consistent with previous even during heart failure exacerbations.  Reviewed pertinent lab work and imaging with patient at bedside. Questions answered.   Most current vital signs reviewed and are as follows: BP (!) 129/103   Pulse 93   Temp 99 F (37.2 C)   Resp 20   SpO2 93%   Plan: Admit to hospital.   Discussed case with [facility]  hospitalist Dr. Arthea who accepts patient.                                     Medical Decision Making Amount and/or Complexity of Data Reviewed Labs: ordered. Radiology: ordered.  Risk Prescription drug management. Decision regarding hospitalization.   Patient with hypoxic respiratory failure in setting of CHF/hypervolemia.  She desats  into the 80s with ambulation and is requiring nasal cannula.  No chest pain, elevated troponin to suggest recurrent PE.  Patient reports compliance with anticoagulation.  Exam and history suggestive of hypervolemia as underlying cause.  Will need admission for diuresis.     Final diagnoses:  Acute on chronic respiratory failure with hypoxia (HCC)  Hypervolemia, unspecified hypervolemia type    ED Discharge Orders     None          Desiderio Chew, PA-C 11/18/23 1903    Geraldene Hamilton, MD 11/19/23 [phone]

## 2023-11-18 NOTE — ED Notes (Signed)
 Bedside commode placed next to stretcher per pt request for easier voiding d/t lasix  administration

## 2023-11-18 NOTE — ED Notes (Signed)
 Called and informed [facility] that [facility] is in route with pt. And to have RN to sign hand-off. RN can call with questions or concerns

## 2023-11-19 ENCOUNTER — Inpatient Hospital Stay (HOSPITAL_COMMUNITY)

## 2023-11-19 DIAGNOSIS — E66813 Obesity, class 3: Secondary | ICD-10-CM

## 2023-11-19 DIAGNOSIS — D509 Iron deficiency anemia, unspecified: Secondary | ICD-10-CM

## 2023-11-19 DIAGNOSIS — I5033 Acute on chronic diastolic (congestive) heart failure: Secondary | ICD-10-CM

## 2023-11-19 DIAGNOSIS — I2699 Other pulmonary embolism without acute cor pulmonale: Secondary | ICD-10-CM

## 2023-11-19 DIAGNOSIS — I509 Heart failure, unspecified: Secondary | ICD-10-CM

## 2023-11-19 DIAGNOSIS — I10 Essential (primary) hypertension: Secondary | ICD-10-CM | POA: Diagnosis not present

## 2023-11-19 DIAGNOSIS — E119 Type 2 diabetes mellitus without complications: Secondary | ICD-10-CM | POA: Diagnosis not present

## 2023-11-19 LAB — BASIC METABOLIC PANEL WITH GFR
Anion gap: 14 (ref 5–15)
BUN: 6 mg/dL (ref 6–20)
CO2: 33 mmol/L — ABNORMAL HIGH (ref 22–32)
Calcium: 9.2 mg/dL (ref 8.9–10.3)
Chloride: 93 mmol/L — ABNORMAL LOW (ref 98–111)
Creatinine, Ser: 0.71 mg/dL (ref 0.44–1.00)
GFR, Estimated: >60 mL/min (ref >60)
Glucose, Bld: 146 mg/dL — ABNORMAL HIGH (ref 70–99)
Potassium: 4.1 mmol/L (ref 3.5–5.1)
Sodium: 140 mmol/L (ref 135–145)

## 2023-11-19 LAB — ECHOCARDIOGRAM COMPLETE
Area-P 1/2: 2.69 cm2
Calc EF: 45.3 %
Height: 62 in
S' Lateral: 2.8 cm
Single Plane A2C EF: 48.5 %
Single Plane A4C EF: 43.9 %
Weight: 5439.19 [oz_av]

## 2023-11-19 LAB — CBC
HCT: 48.1 % — ABNORMAL HIGH (ref 36.0–46.0)
Hemoglobin: 15.5 g/dL — ABNORMAL HIGH (ref 12.0–15.0)
MCH: 23.6 pg — ABNORMAL LOW (ref 26.0–34.0)
MCHC: 32.2 g/dL (ref 30.0–36.0)
MCV: 73.3 fL — ABNORMAL LOW (ref 80.0–100.0)
Platelets: 436 K/uL — ABNORMAL HIGH (ref 150–400)
RBC: 6.56 MIL/uL — ABNORMAL HIGH (ref 3.87–5.11)
RDW: 19.1 % — ABNORMAL HIGH (ref 11.5–15.5)
WBC: 13.1 K/uL — ABNORMAL HIGH (ref 4.0–10.5)
nRBC: 0 % (ref 0.0–0.2)

## 2023-11-19 LAB — GLUCOSE, CAPILLARY
Glucose-Capillary: 116 mg/dL — ABNORMAL HIGH (ref 70–99)
Glucose-Capillary: 129 mg/dL — ABNORMAL HIGH (ref 70–99)
Glucose-Capillary: 122 mg/dL — ABNORMAL HIGH (ref 70–99)
Glucose-Capillary: 94 mg/dL (ref 70–99)
Glucose-Capillary: 129 mg/dL — ABNORMAL HIGH (ref 70–99)

## 2023-11-19 LAB — MAGNESIUM: Magnesium: 1.8 mg/dL (ref 1.7–2.4)

## 2023-11-19 LAB — PHOSPHORUS: Phosphorus: 3.1 mg/dL (ref 2.5–4.6)

## 2023-11-19 LAB — HIV ANTIBODY (ROUTINE TESTING W REFLEX): HIV Screen 4th Generation wRfx: NONREACTIVE

## 2023-11-19 MED ORDER — MELATONIN 5 MG PO TABS: 5.0000 mg | ORAL_TABLET | Freq: Every evening | ORAL | Status: DC | PRN

## 2023-11-19 MED ORDER — PROCHLORPERAZINE EDISYLATE 10 MG/2ML IJ SOLN: 5.0000 mg | Freq: Four times a day (QID) | INTRAMUSCULAR | Status: DC | PRN

## 2023-11-19 MED ORDER — SPIRONOLACTONE 25 MG PO TABS
25.0000 mg | ORAL_TABLET | Freq: Every day | ORAL | Status: DC
  Administered 2023-11-19 – 2023-11-20 (×2): 25 mg via ORAL
  Filled 2023-11-19 (×2): qty 1

## 2023-11-19 MED ORDER — ACETAMINOPHEN 325 MG PO TABS: 650.0000 mg | ORAL_TABLET | Freq: Four times a day (QID) | ORAL | Status: DC | PRN

## 2023-11-19 MED ORDER — EMPAGLIFLOZIN 10 MG PO TABS
10.0000 mg | ORAL_TABLET | Freq: Every day | ORAL | Status: DC
Start: 2023-11-19 — End: 2023-11-20
  Administered 2023-11-19 – 2023-11-20 (×2): 10 mg via ORAL
  Filled 2023-11-19 (×2): qty 1

## 2023-11-19 MED ORDER — IPRATROPIUM-ALBUTEROL 0.5-2.5 (3) MG/3ML IN SOLN: 3.0000 mL | RESPIRATORY_TRACT | Status: DC | PRN

## 2023-11-19 MED ORDER — PERFLUTREN LIPID MICROSPHERE
1.0000 mL | INTRAVENOUS | Status: AC | PRN
  Administered 2023-11-19: 3 mL via INTRAVENOUS

## 2023-11-19 MED ORDER — POLYETHYLENE GLYCOL 3350 17 G PO PACK: 17.0000 g | PACK | Freq: Every day | ORAL | Status: DC | PRN

## 2023-11-19 NOTE — Hospital Course (Addendum)
 Mrs. Christine Singleton was admitted to the hospital with the working diagnosis of heart failure exacerbation.   33 yo female with the past medical history of heart failure, hypertension, pulmonary embolism and obesity who presented with dyspnea.  Reported one week of worsening dyspnea, associated with 15 lbs weight gain. Apparently she has not been adherent to her medications at home. On her initial physical examination her blood pressure was 135/97, HR 101, RR 20 and 02 saturation 95% on room air. Lungs with bilateral rales with no wheezing, heart with S1 and S2 present and regular with no gallops, rubs or murmurs, abdomen with no distention and positive lower extremity edema.   Na 137, K 4.0 Cl 99, bicarbonate 28 glucose 155 bun 9 cr 0,78  High sensitive troponin < 15  Wbc 12.8 hgb 13.8 plt 428  D dimer 0,29   Influenza negative RSV negative SARS covid 19 negative   Chest radiograph with left rotation, bilateral hilar vascular congestion.   EKG 105 bpm, normal axis, normal intervals, qtc 431, sinus rhythm with bilateral atrial enlargement, no significant ST segment changes, negative T wave lead II, III, aVF, V3 to V6. Biventricular hypertrophy.   Patient was placed on IV furosemide  for diuresis and blood pressure medications were resumed.  09/05 patient clinically stable for discharge, she has been advised to be adherent to her medical therapy.

## 2023-11-19 NOTE — Progress Notes (Addendum)
  Progress Note   Patient: Christine Singleton FMW:[id number] DOB: 07-07-1990 DOA: 11/18/2023     1 DOS: the patient was seen and examined on 11/19/2023   Brief hospital course: Christine Singleton was admitted to the hospital with the working diagnosis of heart failure exacerbation.   33 yo female with the past medical history of heart failure, hypertension, pulmonary embolism and obesity who presented with dyspnea.  Reported one week of worsening dyspnea, associated with 15 lbs weight gain. Apparently she has not been adherent to her medications at home. On her initial physical examination her blood pressure was 135/97, HR 101, RR 20 and 02 saturation 95% on room air. Lungs with bilateral rales with no wheezing, heart with S1 and S2 present and regular with no gallops, rubs or murmurs, abdomen with no distention and positive lower extremity edema.   Na 137, K 4.0 Cl 99, bicarbonate 28 glucose 155 bun 9 cr 0,78  High sensitive troponin < 15  Wbc 12.8 hgb 13.8 plt 428  D dimer 0,29   Influenza negative RSV negative SARS covid 19 negative   Chest radiograph with left rotation, bilateral hilar vascular congestion.   EKG 105 bpm, normal axis, normal intervals, qtc 431, sinus rhythm with bilateral atrial enlargement, no significant ST segment changes, negative T wave lead II, III, aVF, V3 to V6. Biventricular hypertrophy.   Assessment and Plan: * Acute on chronic diastolic CHF (congestive heart failure) (HCC) Echocardiogram from 2024 with mild reduction in LV systolic function 45 to 50%.   Volume status has improved.   Plan to hold on pm dose of loop diuretic.  Resume SGLT 2 inh and spironolactone .  If blood pressure stable will add losartan  or low dose ace inh.  Follow up update echocardiogram.   Essential hypertension Continue blood pressure monitoring Resume spironolactone , patient will benefit from further RAAS inhibition.   Pulmonary embolism (HCC) Continue anticoagulation with rivaroxaban .    Non-insulin  dependent type 2 diabetes mellitus (HCC) Continue glucose cover and monitoring with insulin  sliding scale.  Resume metformin  at the time of discharge.   Iron deficiency anemia Cell count stable, positive reactive thrombocytosis.   Class 3 obesity Calculated BMI is 62,1      Subjective: Patient with improvement in dyspnea and edema, no PND or orthopnea   Physical Exam: Vitals:   11/19/23 0432 11/19/23 0739 11/19/23 1102 11/19/23 1553  BP: 126/81 109/76 107/68   Pulse: 93 (!) 113 (!) 106 95  Resp: 19 20 20 19   Temp: 97.7 F (36.5 C) 98.1 F (36.7 C) 98.2 F (36.8 C) 98 F (36.7 C)  TempSrc: Axillary Oral Oral Oral  SpO2: 90% 93% 91% 92%  Weight: (!) 154.2 kg     Height:       Neurology awake and alert ENT with mild pallor Cardiovascular with S1 and S2 present and regular with no gallops, rubs or murmurs No JVD Respiratory with no rales or wheezing, no rhonchi  Abdomen protuberant but not tender or distended No ankle edema   Data Reviewed:    Family Communication: no family at the bedside   Disposition: Status is: Inpatient Remains inpatient appropriate because: recovering heart failure   Planned Discharge Destination: Home    Author: Elidia Toribio Furnace, MD 11/19/2023 4:27 PM  For on call review [email or web address].

## 2023-11-19 NOTE — Plan of Care (Signed)
  Problem: Education: Goal: Knowledge of General Education information will improve Description: Including pain rating scale, medication(s)/side effects and non-pharmacologic comfort measures Outcome: Progressing   Problem: Clinical Measurements: Goal: Diagnostic test results will improve Outcome: Progressing   Problem: Coping: Goal: Level of anxiety will decrease Outcome: Progressing   

## 2023-11-19 NOTE — Assessment & Plan Note (Signed)
 Patient was placed on insulin  sliding scale for glucose cover and monitoring.  Her glucose remained stable.   At discharge will resume metformin  and GLP 1 agonist.

## 2023-11-19 NOTE — Plan of Care (Signed)
°  Problem: Education: °Goal: Knowledge of General Education information will improve °Description: Including pain rating scale, medication(s)/side effects and non-pharmacologic comfort measures °Outcome: Progressing °  °Problem: Activity: °Goal: Risk for activity intolerance will decrease °Outcome: Progressing °  °Problem: Elimination: °Goal: Will not experience complications related to urinary retention °Outcome: Progressing °  °

## 2023-11-19 NOTE — Assessment & Plan Note (Signed)
 Cell count stable, positive reactive thrombocytosis.

## 2023-11-19 NOTE — Assessment & Plan Note (Deleted)
 Echocardiogram from 2024 with mild reduction in LV systolic function 45 to 50%.   Volume status has improved.   Plan to hold on pm dose of loop diuretic.  Resume SGLT 2 inh and spironolactone .  If blood pressure stable will add losartan  or low dose ace inh.

## 2023-11-19 NOTE — Progress Notes (Signed)
  Echocardiogram 2D Echocardiogram has been performed.  Juliene JINNY Rucks 11/19/2023, 3:48 PM

## 2023-11-19 NOTE — Assessment & Plan Note (Signed)
Continue anticoagulation with rivaroxaban 

## 2023-11-19 NOTE — Assessment & Plan Note (Signed)
 Calculated BMI is 62,1

## 2023-11-19 NOTE — Assessment & Plan Note (Signed)
 Continue blood pressure control with losartan and spironolactone .  ?

## 2023-11-19 NOTE — H&P (Addendum)
 History and Physical  Christine Singleton FMW:[id number] DOB: 1991-01-14 DOA: 11/18/2023  Referring physician: Dr. Clairborne, [facility],   PCP: Celestia Rosaline SQUIBB, NP  Outpatient Specialists: Cardiology Patient coming from: Home  Chief Complaint: Shortness of breath   HPI: Christine Singleton is a 33 y.o. female with medical history significant for HFmrEF, hypertension, morbid obesity, OSA on CPAP, pulmonary embolism on Xarelto  who presented to the ER with complaints of shortness of breath x 1 week.  Associated with 15 pound weight gain.  States she missed a few doses of her home diuretics and restarted them recently however has not been able to return to her baseline dry weight.  In the ER, tachycardic with O2 saturation in the 80s and improved on 2 L oxygen by nasal cannula.  Chest x-ray showing cardiomegaly with mild pulmonary edema.  The patient received a dose of IV Lasix  40 mg x 1 in the ER.  [facility], Hospitalist service, was asked to admit.  Transferred to [facility] telemetry cardiac unit as inpatient status.  ED Course: The patient is 7.6.  BP 135/97, pulse 101, respiratory rate 20, O2 saturation 95% on room air.  Lab studies notable for WBC 12.8, hemoglobin 13.8, MCV 72.8, platelet count 428.  Serum glucose 155.  Review of Systems: Review of systems as noted in the HPI. All other systems reviewed and are negative.   Past Medical History:  Diagnosis Date   CHF (congestive heart failure) (HCC)    Gestational diabetes    With first pregnancy   History of cesarean delivery 04/23/2016   Hypertension    PE (pulmonary thromboembolism) (HCC)    Sleep apnea    Past Surgical History:  Procedure Laterality Date   CESAREAN SECTION     CESAREAN SECTION N/A 02/03/2016   Procedure: CESAREAN SECTION;  Surgeon: Burnard VEAR Pate, MD;  Location: [facility] BIRTHING SUITES;  Service: Obstetrics;  Laterality: N/A;    Social History:  reports that she has never smoked. She has never used smokeless tobacco.  She reports that she does not drink alcohol and does not use drugs.   Allergies  Allergen Reactions   Strawberry Extract Anaphylaxis    Family History  Problem Relation Age of Onset   Migraines Mother    Hypertension Mother    Hyperlipidemia Mother    Hypertension Father    Hyperlipidemia Father    Asthma Sister    Cancer Neg Hx    Diabetes Neg Hx       Prior to Admission medications   Medication Sig Start Date End Date Taking? Authorizing Provider  Dulaglutide  (TRULICITY ) 0.75 MG/0.5ML SOAJ Inject 0.75 mg into the skin every Monday. Patient not taking: Reported on 10/21/2023 07/13/23   Amin, Sumayya, MD  empagliflozin  (JARDIANCE ) 10 MG TABS tablet Take 1 tablet (10 mg total) by mouth daily. 10/21/23   Zenaida Morene PARAS, MD  guaiFENesin  (MUCINEX ) 600 MG 12 hr tablet Take 600 mg by mouth 2 (two) times daily as needed for cough or to loosen phlegm.    [provider]  metFORMIN  (GLUCOPHAGE ) 500 MG tablet Take 1 tablet (500 mg total) by mouth daily with breakfast. 05/31/23   Celestia Rosaline SQUIBB, NP  OXYGEN Inhale 3 L/min into the lungs at bedtime.    [provider]  PRESCRIPTION MEDICATION See admin instructions. CPAP- At bedtime    [provider]  rivaroxaban  (XARELTO ) 20 MG TABS tablet Take 1 tablet (20 mg total) by mouth daily. 10/21/23   Zenaida Morene PARAS,  MD  spironolactone  (ALDACTONE ) 25 MG tablet Take 1 tablet (25 mg total) by mouth daily. 10/26/23 11/28/23  Zenaida Morene PARAS, MD  torsemide  (DEMADEX ) 20 MG tablet Take 1 tablet (20 mg total) by mouth daily. 07/08/23   Amin, Sumayya, MD  TYLENOL  500 MG tablet Take 1,000 mg by mouth every 6 (six) hours as needed (for headaches).    [provider]    Physical Exam: BP (!) 135/97 (BP Location: Right Wrist)   Pulse 96   Temp 97.6 F (36.4 C) (Oral)   Resp 20   Ht 5' 2 (1.575 m)   Wt (!) 154.3 kg   SpO2 95%   BMI 62.21 kg/m   General: 33 y.o. year-old female well developed well nourished in  no acute distress.  Alert and oriented x3. Cardiovascular: Regular rate and rhythm with no rubs or gallops.  No thyromegaly or JVD noted.  Trace lower extremity edema bilaterally. Respiratory: Mild rales at bases.  Poor inspiratory effort. Abdomen: Soft nontender nondistended with normal bowel sounds x4 quadrants. Muskuloskeletal: No cyanosis or clubbing noted bilaterally Neuro: CN II-XII intact, strength, sensation, reflexes Skin: No ulcerative lesions noted or rashes Psychiatry: Judgement and insight appear normal. Mood is appropriate for condition and setting          Labs on Admission:  Basic Metabolic Panel: Recent Labs  Lab 11/18/23 1546  NA 137  K 4.0  CL 99  CO2 28  GLUCOSE 155*  BUN 9  CREATININE 0.78  CALCIUM 9.0   Liver Function Tests: No results for input(s): AST, ALT, ALKPHOS, BILITOT, PROT, ALBUMIN in the last 168 hours. No results for input(s): LIPASE, AMYLASE in the last 168 hours. No results for input(s): AMMONIA in the last 168 hours. CBC: Recent Labs  Lab 11/18/23 1546  WBC 12.8*  HGB 13.8  HCT 42.0  MCV 72.8*  PLT 428*   Cardiac Enzymes: No results for input(s): CKTOTAL, CKMB, CKMBINDEX, TROPONINI in the last 168 hours.  BNP (last 3 results) Recent Labs    03/30/23 2115 04/13/23 1647 07/06/23 2209  BNP 141.6* 226.2* 78.8    ProBNP (last 3 results) Recent Labs    11/18/23 1612  PROBNP 193.0    CBG: No results for input(s): GLUCAP in the last 168 hours.  Radiological Exams on Admission: DG Chest 2 View Result Date: 11/18/2023 CLINICAL DATA:  Chest pain.  Cough. EXAM: CHEST - 2 VIEW COMPARISON:  07/06/2023. FINDINGS: Stable cardiomegaly with suspected central pulmonary vascular congestion. No focal consolidation, pleural effusion, or pneumothorax. No acute osseous abnormality. IMPRESSION: Cardiomegaly with suspected central pulmonary vascular congestion. Electronically Signed   By: Harrietta Sherry M.D.   On:  11/18/2023 16:33    EKG: I independently viewed the EKG done and my findings are as followed: Sinus tachycardia rate of 105.  No significant ST changes.  QTc 431.  Assessment/Plan Present on Admission: **None**  Principal Problem:   Acute exacerbation of CHF (congestive heart failure) (HCC)  Acute exacerbation of CHF, acute on chronic HFmrEF Last 2D echo done on 10/26/2022 revealed LVEF 45 to 50% Presented with shortness of breath, pulmonary edema, and hypoxia Ongoing diuresis with IV Lasix  as BP allows Strict I's and O's and daily weight  Acute hypoxic respiratory failure secondary to pulmonary edema, cardiogenic At baseline on 3 L nasal cannula nightly as needed Presented with O2 saturation in the 80s improved on 2 L nasal cannula Continue diuresis Wean off O2 supplementation as tolerated Incentive spirometer Early ambulation  Type 2 diabetes with hyperglycemia Last hemoglobin A1c 6.6 on 01/27/2023 Heart healthy, low-fat diet Sensitive insulin  sign scale.  OSA on CPAP Resume home CPAP  History of pulmonary embolism Resume home Xarelto   Severe morbid obesity BMI 62 Recommend weight loss outpatient with regular physical activity and healthy dieting.    Time: 75 minutes.   DVT prophylaxis: Home Xarelto   Code Status: Full code  Family Communication: None at bedside.  Disposition Plan: Admitted to telemetry cardiac unit.  Consults called: None.  Admission status: Inpatient status.   Status is: Inpatient    Terry LOISE Hurst MD [facility] Pager [phone]  If 7PM-7AM, please contact night-coverage [email or web address] Password [email or web address]  11/19/2023, 3:31 AM

## 2023-11-19 NOTE — Assessment & Plan Note (Signed)
 Echocardiogram with mildly reduced LV systolic function with EF 60 to 65%, mild LVH, E-A fusion, RV with moderate reduction in systolic function, no significant valvular dysfunction.   Patient was placed on furosemide  IV for diuresis, negative fluid balance was achieved, - 2,460 ml, with significant improvement in her symptoms.   Continue medical therapy with SGLT 2 inh, spironolactone  and will add losartan .  As needed torsemide  for signs of volume overload.  She has been advises about lifestyle modifications.

## 2023-11-19 NOTE — Progress Notes (Signed)
 Heart Failure Navigator Progress Note  Assessed for Heart & Vascular TOC clinic readiness.  Patient does not meet criteria due to Advanced Heart Failure Team patient of Dr. Elwyn Lade. .   Navigator will sign off at this time.    Rhae Hammock, BSN, Scientist, clinical (histocompatibility and immunogenetics) Only

## 2023-11-20 ENCOUNTER — Other Ambulatory Visit (HOSPITAL_COMMUNITY): Payer: Self-pay

## 2023-11-20 DIAGNOSIS — I5033 Acute on chronic diastolic (congestive) heart failure: Secondary | ICD-10-CM | POA: Diagnosis not present

## 2023-11-20 DIAGNOSIS — I10 Essential (primary) hypertension: Secondary | ICD-10-CM | POA: Diagnosis not present

## 2023-11-20 DIAGNOSIS — I2699 Other pulmonary embolism without acute cor pulmonale: Secondary | ICD-10-CM | POA: Diagnosis not present

## 2023-11-20 DIAGNOSIS — E119 Type 2 diabetes mellitus without complications: Secondary | ICD-10-CM | POA: Diagnosis not present

## 2023-11-20 LAB — BASIC METABOLIC PANEL WITH GFR
Anion gap: 9 (ref 5–15)
BUN: 7 mg/dL (ref 6–20)
CO2: 31 mmol/L (ref 22–32)
Calcium: 9.1 mg/dL (ref 8.9–10.3)
Chloride: 94 mmol/L — ABNORMAL LOW (ref 98–111)
Creatinine, Ser: 0.81 mg/dL (ref 0.44–1.00)
GFR, Estimated: >60 mL/min (ref >60)
Glucose, Bld: 185 mg/dL — ABNORMAL HIGH (ref 70–99)
Potassium: 3.8 mmol/L (ref 3.5–5.1)
Sodium: 134 mmol/L — ABNORMAL LOW (ref 135–145)

## 2023-11-20 LAB — GLUCOSE, CAPILLARY
Glucose-Capillary: 129 mg/dL — ABNORMAL HIGH (ref 70–99)
Glucose-Capillary: 109 mg/dL — ABNORMAL HIGH (ref 70–99)

## 2023-11-20 MED ORDER — LOSARTAN POTASSIUM 25 MG PO TABS
25.0000 mg | ORAL_TABLET | Freq: Every day | ORAL | 0 refills | Status: DC
  Filled 2023-11-20: qty 30, 30d supply, fill #0

## 2023-11-20 MED ORDER — METFORMIN HCL 500 MG PO TABS
500.0000 mg | ORAL_TABLET | Freq: Every day | ORAL | 0 refills | Status: DC
  Filled 2023-11-20: qty 30, 30d supply, fill #0

## 2023-11-20 MED ORDER — EMPAGLIFLOZIN 10 MG PO TABS
10.0000 mg | ORAL_TABLET | Freq: Every day | ORAL | 0 refills | Status: DC
  Filled 2023-11-20: qty 30, 30d supply, fill #0

## 2023-11-20 MED ORDER — LISINOPRIL 5 MG PO TABS: 5.0000 mg | ORAL_TABLET | Freq: Every day | ORAL | Status: DC

## 2023-11-20 MED ORDER — TRULICITY 0.75 MG/0.5ML ~~LOC~~ SOAJ: 0.7500 mg | SUBCUTANEOUS | Status: DC

## 2023-11-20 MED ORDER — TYLENOL EXTRA STRENGTH 500 MG PO TABS: 500.0000 mg | ORAL_TABLET | Freq: Four times a day (QID) | ORAL | Status: AC | PRN

## 2023-11-20 MED ORDER — LOSARTAN POTASSIUM 25 MG PO TABS: 25.0000 mg | ORAL_TABLET | Freq: Every day | ORAL | Status: DC

## 2023-11-20 MED ORDER — SPIRONOLACTONE 25 MG PO TABS
25.0000 mg | ORAL_TABLET | Freq: Every day | ORAL | 0 refills | Status: DC
  Filled 2023-11-20: qty 30, 30d supply, fill #0

## 2023-11-20 MED ORDER — TORSEMIDE 20 MG PO TABS: 20.0000 mg | ORAL_TABLET | Freq: Every day | ORAL | Status: DC

## 2023-11-20 NOTE — TOC CM/SW Note (Signed)
 Transition of Care [facility]) - Inpatient Brief Assessment   Patient Details  Name: Ailany Koren MRN: [id number] Date of Birth: 05/21/1990  Transition of Care [facility]) CM/SW Contact:    Waddell Barnie Rama, RN Phone Number: 11/20/2023, 12:49 PM   Clinical Narrative: From home alone,  has PCP and insurance on file, states has no HH services in place at this time has home oxygen prn at home.  States family member will transport them home at [employer] and family is support system, states gets medications from [facility].  Pta self ambulatory.   There are no ICM (inpatient Case Management)  needs identified  at this time.  Please place consult for ICM ( inpatient Case Management) needs.     Transition of Care Asessment: Insurance and Status: Insurance coverage has been reviewed Patient has primary care physician: Yes Home environment has been reviewed: home alone Prior level of function:: indep Prior/Current Home Services: No current home services Social Drivers of Health Review: SDOH reviewed no interventions necessary Readmission risk has been reviewed: Yes Transition of care needs: no transition of care needs at this time

## 2023-11-20 NOTE — Progress Notes (Signed)
 Mobility Specialist Progress Note:    11/20/23 1044  Mobility  Activity Ambulated independently  Level of Assistance Independent after set-up  Assistive Device None  Distance Ambulated (ft) 200 ft  Activity Response Tolerated well  Mobility Referral Yes  Mobility visit 1 Mobility  Mobility Specialist Start Time (ACUTE ONLY) 1044  Mobility Specialist Stop Time (ACUTE ONLY) 1056  Mobility Specialist Time Calculation (min) (ACUTE ONLY) 12 min   Pt pleasant and agreeable to session. No c/o any symptoms but noted feeling like she could breathe better if she didn't have a cold. Pt moving and ambulating well otherwise. Left pt on EOB w/ all needs met.   Venetia Keel Mobility Specialist Please Neurosurgeon or Rehab Office at [phone]

## 2023-11-20 NOTE — Discharge Summary (Addendum)
 Physician Discharge Summary   Patient: Christine Singleton MRN: [id number] DOB: 1990-06-24  Admit date:     11/18/2023  Discharge date: 11/20/23  Discharge Physician: Elidia Sieving Chaise Mahabir   PCP: Celestia Rosaline SQUIBB, NP   Recommendations at discharge:    Patient has been placed on losartan  for heart failure guideline directed medical therapy, continue SGLT 2 inh and spironolactone  Changed torsemide  to take as needed for signs of volume overload, weight gain 2 to 3 lbs in 24 hrs or 5 lbs in 7 days.  She has been advised to stay adherent to her medications and follow up a healthy lifestyle.  GLP1 agonist has been refilled.  Follow up renal function and electrolytes in 7 days as outpatient Follow up with Rosaline Celestia NP in 7 to 10 days Follow up with Cardiology as scheduled.   Discharge Diagnoses: Principal Problem:   Acute on chronic diastolic CHF (congestive heart failure) (HCC) Active Problems:   Essential hypertension   Pulmonary embolism (HCC)   Non-insulin  dependent type 2 diabetes mellitus (HCC)   Iron deficiency anemia   Class 3 obesity  Resolved Problems:   * No resolved hospital problems. [facility] Course: Christine Singleton was admitted to the hospital with the working diagnosis of heart failure exacerbation.   33 yo female with the past medical history of heart failure, hypertension, pulmonary embolism and obesity who presented with dyspnea.  Reported one week of worsening dyspnea, associated with 15 lbs weight gain. Apparently she has not been adherent to her medications at home. On her initial physical examination her blood pressure was 135/97, HR 101, RR 20 and 02 saturation 95% on room air. Lungs with bilateral rales with no wheezing, heart with S1 and S2 present and regular with no gallops, rubs or murmurs, abdomen with no distention and positive lower extremity edema.   Na 137, K 4.0 Cl 99, bicarbonate 28 glucose 155 bun 9 cr 0,78  High sensitive troponin < 15  Wbc  12.8 hgb 13.8 plt 428  D dimer 0,29   Influenza negative RSV negative SARS covid 19 negative   Chest radiograph with left rotation, bilateral hilar vascular congestion.   EKG 105 bpm, normal axis, normal intervals, qtc 431, sinus rhythm with bilateral atrial enlargement, no significant ST segment changes, negative T wave lead II, III, aVF, V3 to V6. Biventricular hypertrophy.   Patient was placed on IV furosemide  for diuresis and blood pressure medications were resumed.  09/05 patient clinically stable for discharge, she has been advised to be adherent to her medical therapy.   Assessment and Plan: * Acute on chronic diastolic CHF (congestive heart failure) (HCC) Echocardiogram with mildly reduced LV systolic function with EF 60 to 65%, mild LVH, E-A fusion, RV with moderate reduction in systolic function, no significant valvular dysfunction.   Patient was placed on furosemide  IV for diuresis, negative fluid balance was achieved, - 2,460 ml, with significant improvement in her symptoms.   Continue medical therapy with SGLT 2 inh, spironolactone  and will add losartan .  As needed torsemide  for signs of volume overload.  She has been advises about lifestyle modifications.   Essential hypertension Continue blood pressure control with losartan  and spironolactone    Pulmonary embolism (HCC) Continue anticoagulation with rivaroxaban .   Non-insulin  dependent type 2 diabetes mellitus (HCC) Patient was placed on insulin  sliding scale for glucose cover and monitoring.  Her glucose remained stable.   At discharge will resume metformin  and GLP 1 agonist.   Iron deficiency anemia Cell count  stable, positive reactive thrombocytosis.   Class 3 obesity Calculated BMI is 62,1       Consultants: none  Procedures performed: none   Disposition: Home Diet recommendation:  Cardiac and Carb modified diet DISCHARGE MEDICATION: Allergies as of 11/20/2023       Reactions   Strawberry Extract  Anaphylaxis        Medication List     TAKE these medications    empagliflozin  10 MG Tabs tablet Commonly known as: JARDIANCE  Take 1 tablet (10 mg total) by mouth daily.   guaiFENesin  600 MG 12 hr tablet Commonly known as: MUCINEX  Take 600 mg by mouth 2 (two) times daily as needed for cough or to loosen phlegm.   losartan  25 MG tablet Commonly known as: COZAAR  Take 1 tablet (25 mg total) by mouth daily.   metFORMIN  500 MG tablet Commonly known as: GLUCOPHAGE  Take 1 tablet (500 mg total) by mouth daily with breakfast.   OXYGEN Inhale 3 L/min into the lungs at bedtime.   PRESCRIPTION MEDICATION See admin instructions. CPAP- At bedtime   spironolactone  25 MG tablet Commonly known as: ALDACTONE  Take 1 tablet (25 mg total) by mouth daily.   torsemide  20 MG tablet Commonly known as: DEMADEX  Take 1 tablet (20 mg total) by mouth daily. Take as needed for weight gain 2 to 3 lbs in 24 hrs or 5 lbs in 7 days. What changed: additional instructions   Trulicity  0.75 MG/0.5ML Soaj Generic drug: Dulaglutide  Inject 0.75 mg into the skin every Monday for 28 days. Start taking on: November 23, 2023   TYLENOL  500 MG tablet Generic drug: acetaminophen  Take 1 tablet (500 mg total) by mouth every 6 (six) hours as needed (for headaches). What changed: how much to take   Xarelto  20 MG Tabs tablet Generic drug: rivaroxaban  Take 1 tablet (20 mg total) by mouth daily.        Discharge Exam: Filed Weights   11/18/23 2313 11/19/23 0432 11/20/23 0407  Weight: (!) 154.3 kg (!) 154.2 kg (!) 151.5 kg   BP 134/76 (BP Location: Right Wrist)   Pulse (!) 106   Temp 97.8 F (36.6 C) (Oral)   Resp 20   Ht 5' 2 (1.575 m)   Wt (!) 151.5 kg   SpO2 91%   BMI 61.09 kg/m   Patient with no chest pain or dyspnea, no lower extremity edema, PND or orthopnea  Neurology awake and alert ENT with mild pallor Cardiovascular with S1 and S2 present and regular with no gallops, rubs or  murmurs Respiratory with no rales or wheezing, no rhonchi  Abdomen with no distention  No lower extremity edema   Condition at discharge: stable  The results of significant diagnostics from this hospitalization (including imaging, microbiology, ancillary and laboratory) are listed below for reference.   Imaging Studies: ECHOCARDIOGRAM COMPLETE Result Date: 11/19/2023    ECHOCARDIOGRAM REPORT   Patient Name:   Christine Singleton Date of Exam: 11/19/2023 Medical Rec #:  [id number]      Height:       62.0 in Accession #:    [id number]     Weight:       339.9 lb Date of Birth:  [birth year]     BSA:          2.395 m Patient Age:    32 years       BP:           107/68 mmHg Patient Gender: F  HR:           102 bpm. Exam Location:  Inpatient Procedure: 2D Echo, Intracardiac Opacification Agent, Cardiac Doppler and Color            Doppler (Both Spectral and Color Flow Doppler were utilized during            procedure). Indications:    CHF  History:        Patient has prior history of Echocardiogram examinations, most                 recent 01/27/2023. CHF, Signs/Symptoms:Shortness of Breath; Risk                 Factors:Diabetes and Hypertension.  Sonographer:    Juliene Rucks Referring Phys: [id number] CAROLE N HALL IMPRESSIONS  1. Left ventricular ejection fraction, by estimation, is 60 to 65%. The left ventricle has normal function. The left ventricle has no regional wall motion abnormalities. There is mild concentric left ventricular hypertrophy. Indeterminate diastolic filling due to E-A fusion.  2. Right ventricular systolic function is moderately reduced. The right ventricular size is moderately enlarged. Tricuspid regurgitation signal is inadequate for assessing PA pressure.  3. The mitral valve is normal in structure. No evidence of mitral valve regurgitation. No evidence of mitral stenosis.  4. The aortic valve is tricuspid. There is mild calcification of the aortic valve. Aortic valve regurgitation is  not visualized. Aortic valve sclerosis/calcification is present, without any evidence of aortic stenosis.  5. The inferior vena cava is normal in size with greater than 50% respiratory variability, suggesting right atrial pressure of 3 mmHg. FINDINGS  Left Ventricle: Left ventricular ejection fraction, by estimation, is 60 to 65%. The left ventricle has normal function. The left ventricle has no regional wall motion abnormalities. The left ventricular internal cavity size was normal in size. There is  mild concentric left ventricular hypertrophy. Indeterminate diastolic filling due to E-A fusion. Right Ventricle: The right ventricular size is moderately enlarged. No increase in right ventricular wall thickness. Right ventricular systolic function is moderately reduced. Tricuspid regurgitation signal is inadequate for assessing PA pressure. Left Atrium: Left atrial size was normal in size. Right Atrium: Right atrial size was normal in size. Pericardium: There is no evidence of pericardial effusion. Mitral Valve: The mitral valve is normal in structure. No evidence of mitral valve regurgitation. No evidence of mitral valve stenosis. Tricuspid Valve: The tricuspid valve is normal in structure. Tricuspid valve regurgitation is trivial. No evidence of tricuspid stenosis. Aortic Valve: The aortic valve is tricuspid. There is mild calcification of the aortic valve. Aortic valve regurgitation is not visualized. Aortic valve sclerosis/calcification is present, without any evidence of aortic stenosis. Pulmonic Valve: The pulmonic valve was normal in structure. Pulmonic valve regurgitation is trivial. No evidence of pulmonic stenosis. Aorta: The aortic root is normal in size and structure. Venous: The inferior vena cava is normal in size with greater than 50% respiratory variability, suggesting right atrial pressure of 3 mmHg. IAS/Shunts: No atrial level shunt detected by color flow Doppler.  LEFT VENTRICLE PLAX 2D LVIDd:          4.30 cm      Diastology LVIDs:         2.80 cm      LV e' medial:    5.98 cm/s LV PW:         1.40 cm      LV E/e' medial:  11.7 LV IVS:  1.20 cm      LV e' lateral:   5.87 cm/s LVOT diam:     1.90 cm      LV E/e' lateral: 11.9 LVOT Area:     2.84 cm  LV Volumes (MOD) LV vol d, MOD A2C: 112.0 ml LV vol d, MOD A4C: 230.0 ml LV vol s, MOD A2C: 57.7 ml LV vol s, MOD A4C: 129.0 ml LV SV MOD A2C:     54.3 ml LV SV MOD A4C:     230.0 ml LV SV MOD BP:      74.1 ml RIGHT VENTRICLE             IVC RV Basal diam:  3.50 cm     IVC diam: 1.50 cm RV Mid diam:    3.50 cm RV S prime:     16.10 cm/s TAPSE (M-mode): 1.3 cm LEFT ATRIUM           Index        RIGHT ATRIUM          Index LA diam:      3.20 cm 1.34 cm/m   RA Area:     9.20 cm LA Vol (A2C): 35.6 ml 14.86 ml/m  RA Volume:   15.60 ml 6.51 ml/m LA Vol (A4C): 35.9 ml 14.99 ml/m   AORTA Ao Root diam: 2.90 cm MITRAL VALVE MV Area (PHT): 2.69 cm    SHUNTS MV Decel Time: 282 msec    Systemic Diam: 1.90 cm MV E velocity: 70.00 cm/s Toribio Fuel MD Electronically signed by Toribio Fuel MD Signature Date/Time: 11/19/2023/7:01:50 PM    Final    DG Chest 2 View Result Date: 11/18/2023 CLINICAL DATA:  Chest pain.  Cough. EXAM: CHEST - 2 VIEW COMPARISON:  07/06/2023. FINDINGS: Stable cardiomegaly with suspected central pulmonary vascular congestion. No focal consolidation, pleural effusion, or pneumothorax. No acute osseous abnormality. IMPRESSION: Cardiomegaly with suspected central pulmonary vascular congestion. Electronically Signed   By: Harrietta Sherry M.D.   On: 11/18/2023 16:33    Microbiology: Results for orders placed or performed during the hospital encounter of 11/18/23  Resp panel by RT-PCR (RSV, Flu A&B, Covid) Anterior Nasal Swab     Status: None   Collection Time: 11/18/23  3:46 PM   Specimen: Anterior Nasal Swab  Result Value Ref Range Status   SARS Coronavirus 2 by RT PCR NEGATIVE NEGATIVE Final    Comment: (NOTE) SARS-CoV-2 target  nucleic acids are NOT DETECTED.  The SARS-CoV-2 RNA is generally detectable in upper respiratory specimens during the acute phase of infection. The lowest concentration of SARS-CoV-2 viral copies this assay can detect is 138 copies/mL. A negative result does not preclude SARS-Cov-2 infection and should not be used as the sole basis for treatment or other patient management decisions. A negative result may occur with  improper specimen collection/handling, submission of specimen other than nasopharyngeal swab, presence of viral mutation(s) within the areas targeted by this assay, and inadequate number of viral copies(<138 copies/mL). A negative result must be combined with clinical observations, patient history, and epidemiological information. The expected result is Negative.  Fact Sheet for Patients:  BloggerCourse.com  Fact Sheet for Healthcare Providers:  SeriousBroker.it  This test is no t yet approved or cleared by the [place]  FDA and  has been authorized for detection and/or diagnosis of SARS-CoV-2 by FDA under an Emergency Use Authorization (EUA). This EUA will remain  in effect (meaning this test can be used) for the duration  of the COVID-19 declaration under Section 564(b)(1) of the Act, 21 U.S.C.section 360bbb-3(b)(1), unless the authorization is terminated  or revoked sooner.       Influenza A by PCR NEGATIVE NEGATIVE Final   Influenza B by PCR NEGATIVE NEGATIVE Final    Comment: (NOTE) The Xpert Xpress SARS-CoV-2/FLU/RSV plus assay is intended as an aid in the diagnosis of influenza from Nasopharyngeal swab specimens and should not be used as a sole basis for treatment. Nasal washings and aspirates are unacceptable for Xpert Xpress SARS-CoV-2/FLU/RSV testing.  Fact Sheet for Patients: BloggerCourse.com  Fact Sheet for Healthcare  Providers: SeriousBroker.it  This test is not yet approved or cleared by the [place]  FDA and has been authorized for detection and/or diagnosis of SARS-CoV-2 by FDA under an Emergency Use Authorization (EUA). This EUA will remain in effect (meaning this test can be used) for the duration of the COVID-19 declaration under Section 564(b)(1) of the Act, 21 U.S.C. section 360bbb-3(b)(1), unless the authorization is terminated or revoked.     Resp Syncytial Virus by PCR NEGATIVE NEGATIVE Final    Comment: (NOTE) Fact Sheet for Patients: BloggerCourse.com  Fact Sheet for Healthcare Providers: SeriousBroker.it  This test is not yet approved or cleared by the [place]  FDA and has been authorized for detection and/or diagnosis of SARS-CoV-2 by FDA under an Emergency Use Authorization (EUA). This EUA will remain in effect (meaning this test can be used) for the duration of the COVID-19 declaration under Section 564(b)(1) of the Act, 21 U.S.C. section 360bbb-3(b)(1), unless the authorization is terminated or revoked.  Performed at [facility], [address] [place], [place] [address]     Labs: CBC: Recent Labs  Lab 11/18/23 1546 11/19/23 0715  WBC 12.8* 13.1*  HGB 13.8 15.5*  HCT 42.0 48.1*  MCV 72.8* 73.3*  PLT 428* 436*   Basic Metabolic Panel: Recent Labs  Lab 11/18/23 1546 11/19/23 0715 11/20/23 0948  NA 137 140 134*  K 4.0 4.1 3.8  CL 99 93* 94*  CO2 28 33* 31  GLUCOSE 155* 146* 185*  BUN 9 6 7   CREATININE 0.78 0.71 0.81  CALCIUM 9.0 9.2 9.1  MG  --  1.8  --   PHOS  --  3.1  --    Liver Function Tests: No results for input(s): AST, ALT, ALKPHOS, BILITOT, PROT, ALBUMIN in the last 168 hours. CBG: Recent Labs  Lab 11/19/23 1100 11/19/23 1613 11/19/23 2101 11/20/23 0610 11/20/23 1059  GLUCAP 122* 94 129* 129* 109*    Discharge time spent:  greater than 30 minutes.  Signed: Elidia Toribio Furnace, MD [facility] 11/20/2023

## 2023-11-20 NOTE — TOC Transition Note (Signed)
 Transition of Care [facility]) - Discharge Note   Patient Details  Name: Christine Singleton MRN: [id number] Date of Birth: 09-04-1990  Transition of Care [facility]) CM/SW Contact:  Waddell Barnie Rama, RN Phone Number: 11/20/2023, 12:51 PM   Clinical Narrative:    For dc today, she has no needs.  She will have transport at dc.          Patient Goals and CMS Choice            Discharge Placement                       Discharge Plan and Services Additional resources added to the After Visit Summary for                                       Social Drivers of Health (SDOH) Interventions SDOH Screenings   Food Insecurity: No Food Insecurity (11/18/2023)  Housing: Low Risk  (11/18/2023)  Transportation Needs: No Transportation Needs (11/18/2023)  Utilities: Not At Risk (11/18/2023)  Alcohol Screen: Low Risk  (08/18/2022)  Depression (PHQ2-9): Medium Risk (02/10/2023)  Financial Resource Strain: High Risk (09/08/2022)  Tobacco Use: Low Risk  (11/18/2023)     Readmission Risk Interventions    11/20/2023   12:48 PM  Readmission Risk Prevention Plan  Transportation Screening Complete  PCP or Specialist Appt within 5-7 Days Complete  Home Care Screening Complete  Medication Review (RN CM) Complete

## 2023-11-23 ENCOUNTER — Telehealth: Payer: Self-pay | Admitting: *Deleted

## 2023-11-23 DIAGNOSIS — Z599 Problem related to housing and economic circumstances, unspecified: Secondary | ICD-10-CM

## 2023-11-23 NOTE — Transitions of Care (Post Inpatient/ED Visit) (Signed)
 11/23/2023  Name: Christine Singleton MRN: [id number] DOB: 19-Aug-1990  Today's TOC FU Call Status: Today's TOC FU Call Status:: Successful TOC FU Call Completed TOC FU Call Complete Date: 11/23/23 Patient's Name and Date of Birth confirmed.  Transition Care Management Follow-up Telephone Call Date of Discharge: 11/20/23 Discharge Facility: [facility] [place]) Type of Discharge: Inpatient Admission Primary Inpatient Discharge Diagnosis:: Acute on chronic diastolic CHF How have you been since you were released from the hospital?: Better Any questions or concerns?: No  Items Reviewed: Did you receive and understand the discharge instructions provided?: Yes Medications obtained,verified, and reconciled?: Yes (Medications Reviewed) Any new allergies since your discharge?: No Dietary orders reviewed?: Yes Type of Diet Ordered:: low sodium heart healthy, fluid restriction 64 oz/day Do you have support at home?: Yes People in Home [RPT]: child(ren), dependent, parent(s), sibling(s) Name of Support/Comfort Primary Source: Mother/Shamika and Brother  Medications Reviewed Today: Medications Reviewed Today     Reviewed by [name] LABOR, RN (Registered Nurse) on 11/23/23 at 1028  Med List Status: <None>   Medication Order Taking? Sig Documenting Provider Last Dose Status Informant  cetirizine (ZYRTEC) 10 MG tablet [id number]  Take 10 mg by mouth daily as needed for allergies.  Patient not taking: Reported on 11/23/2023   [provider]  Active   Dulaglutide  (TRULICITY ) 0.75 MG/0.5ML SOAJ [id number]  Inject 0.75 mg into the skin every Monday for 28 days.  Patient not taking: Reported on 11/23/2023   Arrien, Mauricio Daniel, MD  Active   empagliflozin  (JARDIANCE ) 10 MG TABS tablet [id number] Yes Take 1 tablet (10 mg total) by mouth daily. Arrien, Elidia Sieving, MD  Active   guaiFENesin  (MUCINEX ) 600 MG 12 hr tablet [id number] Yes Take 600 mg by mouth 2 (two) times daily as needed for cough or  to loosen phlegm. [provider]  Active Self  losartan  (COZAAR ) 25 MG tablet [id number] Yes Take 1 tablet (25 mg total) by mouth daily. Arrien, Mauricio Daniel, MD  Active   metFORMIN  (GLUCOPHAGE ) 500 MG tablet [id number] Yes Take 1 tablet (500 mg total) by mouth daily with breakfast. Noralee Elidia Sieving, MD  Active   OXYGEN [id number] Yes Inhale 3 L/min into the lungs at bedtime. [provider]  Active Self  PRESCRIPTION MEDICATION [id number] Yes See admin instructions. CPAP- At bedtime [provider]  Active Self  rivaroxaban  (XARELTO ) 20 MG TABS tablet [id number] Yes Take 1 tablet (20 mg total) by mouth daily. Zenaida Morene PARAS, MD  Active   spironolactone  (ALDACTONE ) 25 MG tablet [id number] Yes Take 1 tablet (25 mg total) by mouth daily. Arrien, Elidia Sieving, MD  Active   torsemide  (DEMADEX ) 20 MG tablet [id number] Yes Take 1 tablet (20 mg total) by mouth daily. Take as needed for weight gain 2 to 3 lbs in 24 hrs or 5 lbs in 7 days. Arrien, Elidia Sieving, MD  Active   TYLENOL  500 MG tablet [id number] Yes Take 1 tablet (500 mg total) by mouth every 6 (six) hours as needed (for headaches). Arrien, Elidia Sieving, MD  Active             Home Care and Equipment/Supplies: Were Home Health Services Ordered?: No Any new equipment or medical supplies ordered?: No  Functional Questionnaire: Do you need assistance with bathing/showering or dressing?: No Do you need assistance with meal preparation?: No Do you need assistance with eating?: No Do you have difficulty maintaining continence: No Do you need assistance with getting out of bed/getting out of  a chair/moving?: No Do you have difficulty managing or taking your medications?: No  Follow up appointments reviewed: PCP Follow-up appointment confirmed?: No (Patient will call and schedule a hospital follow up with PCP) MD Provider Line Number:[phone] Given: No Specialist Hospital Follow-up appointment  confirmed?: Yes Date of Specialist follow-up appointment?: 12/14/23 Follow-Up Specialty Provider:: Cardiology Do you need transportation to your follow-up appointment?: No Do you understand care options if your condition(s) worsen?: Yes-patient verbalized understanding  SDOH Interventions Today    Flowsheet Row Most Recent Value  SDOH Interventions   Food Insecurity Interventions Other (Comment)  [referral to [facility] SW]  Housing Interventions Other (Comment)  [referral to [employer] SW]  Transportation Interventions Intervention Not Indicated  Utilities Interventions Other (Comment)  [referral to [employer] SW]    Goals Addressed             This Visit's Progress    [facility] Transitions of Care (TOC) Care Plan       Problems:  Recent Hospitalization for treatment of CHF No Hospital Follow Up Provider appointment Patient prefers to schedule an appointment independently and SDOH barrier: housing, utilities and food  Goal:  Over the next 30 days, the patient will not experience hospital readmission  Interventions:  Transitions of Care: Doctor Visits  - discussed the importance of doctor visits Post discharge activity limitations prescribed by provider reviewed Medication review, discussed in detail when to take torsemide   Heart Failure Interventions: Basic overview and discussion of pathophysiology of Heart Failure reviewed Assessed need for readable accurate scales in home Advised patient to weigh each morning after emptying bladder Discussed importance of daily weight and advised patient to weigh and record daily Reviewed role of diuretics in prevention of fluid overload and management of heart failure; Discussed the importance of keeping all appointments with provider Assessed social determinant of health barriers  Education provided on HTN and the importance of checking BP and weight and when to notify the provider  Patient Self Care Activities:  Attend all scheduled provider  appointments Call pharmacy for medication refills 3-7 days in advance of running out of medications Call provider office for new concerns or questions  Notify RN Care Manager of TOC call rescheduling needs Participate in Transition of Care Program/Attend TOC scheduled calls Take medications as prescribed   Work with the social worker to address care coordination needs and will continue to work with the clinical team to address health care and disease management related needs call office if I gain more than 2 pounds in one day or 5 pounds in one week track weight in diary use salt in moderation watch for swelling in feet, ankles and legs every day weigh myself daily  Plan:  Telephone follow up appointment with care management team member scheduled for:  12/01/23 at 11am         Andrea Dimes RN, BSN [place]  [facility] RN Care Manager [phone]

## 2023-11-24 ENCOUNTER — Telehealth: Payer: Self-pay

## 2023-11-24 NOTE — Progress Notes (Signed)
 Complex Care Management Note Care Guide Note  11/24/2023 Name: Christine Singleton MRN: [id number] DOB: [birth year]   Complex Care Management Outreach Attempts: An unsuccessful telephone outreach was attempted today to offer the patient information about available complex care management services.  Follow Up Plan:  Additional outreach attempts will be made to offer the patient complex care management information and services.   Encounter Outcome:  No Answer  Adalei Novell [facility]  [facility] Guide Direct Dial: [phone]  Fax: [phone] Website: [email or web address]

## 2023-11-27 ENCOUNTER — Telehealth: Payer: Self-pay

## 2023-11-27 NOTE — Progress Notes (Signed)
 Complex Care Management Note Care Guide Note  11/27/2023 Name: Christine Singleton MRN: [id number] DOB: 08/14/1990   Complex Care Management Outreach Attempts: A second unsuccessful outreach was attempted today to offer the patient with information about available complex care management services.  Follow Up Plan:  Additional outreach attempts will be made to offer the patient complex care management information and services.   Encounter Outcome:  No Answer  Logan Vegh [facility]  [facility] Guide Direct Dial: [phone]  Fax: [phone] Website: [email or web address]

## 2023-11-30 ENCOUNTER — Telehealth: Payer: Self-pay

## 2023-11-30 NOTE — Progress Notes (Signed)
 Complex Care Management Note Care Guide Note  11/30/2023 Name: Christine Singleton MRN: [id number] DOB: Oct 01, 1990   Complex Care Management Outreach Attempts: A third unsuccessful outreach was attempted today to offer the patient with information about available complex care management services.  Follow Up Plan:  No further outreach attempts will be made at this time. We have been unable to contact the patient to offer or enroll patient in complex care management services.  Encounter Outcome:  No Answer  Blimie Vaness [facility]  [facility] Guide Direct Dial: [phone]  Fax: [phone] Website: [email or web address]

## 2023-12-01 ENCOUNTER — Telehealth: Payer: Self-pay | Admitting: *Deleted

## 2023-12-01 ENCOUNTER — Encounter: Payer: Self-pay | Admitting: *Deleted

## 2023-12-02 ENCOUNTER — Encounter: Payer: Self-pay | Admitting: *Deleted

## 2023-12-03 NOTE — Progress Notes (Deleted)
 Cardiology Office Note:  .   Date:  12/03/2023  ID:  [name], DOB 1990-04-21, MRN [id number] PCP: Celestia Rosaline SQUIBB, NP  [facility] Providers Cardiologist:  Alvan Ronal BRAVO, MD (Inactive) { Click to update primary MD,subspecialty MD or APP then REFRESH:1}   History of Present Illness: .   Christine Singleton is a 33 y.o. female  has a PMH of HFmrEF, OSA, history of PE, morbid obesity, iron deficiency anemia, hypovolemia, and essential hypertension.  She was referred to cardiology for an evaluation of her reduced EF 45-50%.  She was not noted to have RV dysfunction or pulmonary hypertension.  She was diagnosed with PE 11/24.  She was placed on Eliquis .  She was noted to have obstructive sleep apnea and was using BiPAP.  She was seen in the emergency department 2/25 with shortness of breath.  She was seen in follow-up by Dr. Alvan 05/08/2023.  During that time it was felt that her EF was closer to 50%.  She was noted to have hypokinesis of the inferior septal wall.  Her EKG showed sinus tachycardia.  Nuclear stress testing was deferred.  Her reduced EF was not felt to be related to ischemia.  Her Jardiance  was continued as well as spironolactone  and furosemide .  Follow-up in 6 months was planned.  She was seen in follow-up by Dr. Zenaida 10/21/2023.  During that time she reported that her breathing was stable.  She was noted to be short of breath with more than moderate exertion.  She denied orthopnea and was compliant with her BiPAP.  She was noted to have some lower extremity swelling.  She continued her diuretics.  She denied chest pain.  She was waiting on refills for her GLP-1 from her primary care provider.   ROS: ***  Studies Reviewed: SABRA         Prior CV Studies: {Select studies to display:26339}  ***  Risk Assessment/Calculations:   {Does this patient have ATRIAL FIBRILLATION?[phone]} No BP recorded.  {Refresh Note OR Click here to enter BP  :1}***       Physical  Exam:   VS:  There were no vitals taken for this visit.   Orhtostatics: No data found. Wt Readings from Last 3 Encounters:  11/23/23 (!) 333 lb (151 kg)  11/20/23 (!) 334 lb (151.5 kg)  10/21/23 (!) 354 lb 12.8 oz (160.9 kg)    GEN: Well nourished, well developed in no acute distress NECK: No JVD; No carotid bruits CARDIAC: ***RRR, no murmurs, rubs, gallops RESPIRATORY:  Clear to auscultation without rales, wheezing or rhonchi  ABDOMEN: Soft, non-tender, non-distended EXTREMITIES:  No edema; No deformity   ASSESSMENT AND PLAN: .    HFpEF-denies increased DOE or increased activity intolerance.  Weight today***.  Echocardiogram 1124 showed LVEF of 45-50%, normal diastolic parameters, mildly reduced RV function, and trivial tricuspid valve regurgitation. Heart healthy low-sodium diet Daily weights Continue Jardiance , spironolactone , torsemide  Lower extremity support stockings Elevate lower extremities when not active Following with advanced heart failure team  Hypertension-BP today***. Maintain blood pressure log Continue spironolactone , torsemide   OSA-reports compliance with BiPAP. Avoid supine sleeping Sleep hygiene instructions Continue weight loss  History of PE-reports compliance with Xarelto .  Denies bleeding issues. Following with PCP  Morbid obesity-weight today***. Reduced calorie diet Increase physical activity as tolerated Continue GLP-1  History of PE-reassuring echocardiogram 11/24.  Reports compliance with Xarelto .  Denies bleeding issues. Continue Xarelto       {Are you ordering a CV Procedure (e.g.  stress test, cath, DCCV, TEE, etc)?   Press F2        :[id number]}  Dispo: ***  Signed, Olivia Pavy, PA-C

## 2023-12-13 NOTE — Progress Notes (Unsigned)
 Cardiology Office Note:  .   Date:  12/14/2023  ID:  Christine Singleton, DOB 27-Dec-1990, MRN [id number] PCP: Celestia Rosaline SQUIBB, NP  [facility] Providers Cardiologist:  Alvan Ronal BRAVO, MD (Inactive)    History of Present Illness: .   Christine Singleton is a 33 y.o. female  has a PMH of HFmrEF, OSA, history of PE, morbid obesity, iron deficiency anemia, hypovolemia, and essential hypertension.  She was referred to cardiology for an evaluation of her reduced EF 45-50%.  She was not noted to have RV dysfunction or pulmonary hypertension.  She was diagnosed with PE 11/24.  She was placed on Eliquis .  She was noted to have obstructive sleep apnea and was using BiPAP.  She was seen in the emergency department 2/25 with shortness of breath.  She was seen in follow-up by Dr. Alvan 05/08/2023.  During that time it was felt that her EF was closer to 50%.  She was noted to have hypokinesis of the inferior septal wall.  Her EKG showed sinus tachycardia.  Nuclear stress testing was deferred.  Her reduced EF was not felt to be related to ischemia.  Her Jardiance  was continued as well as spironolactone  and furosemide .   She was seen in follow-up by Dr. Zenaida 10/21/2023.  During that time she reported that her breathing was stable.  She was noted to be short of breath with more than moderate exertion.  She denied orthopnea and was compliant with her BiPAP.  She was noted to have some lower extremity swelling.  She continued her diuretics.    Patient discharged 11/20/23 with acute diastolic CHF. She diuresed and was told to take diuretics prn. She comes in with 18 lb weight gain. She ran out of torsemide   last week. She is drinking a lot of water because her mouth is so dry. Can't restrict her fluids. She thinks the CPAP mask is drying her out as well. On O2 prn. She is smoking 3 blunts of marijuana daily. She eats terrible and getting extra salt. Echo 11/19/23 normal LVEF, moderately reduced RV function.   ROS:     Studies Reviewed: SABRA    EKG Interpretation Date/Time:  Monday December 14 2023 12:10:32 EDT Ventricular Rate:  109 PR Interval:  86 QRS Duration:  82 QT Interval:  312 QTC Calculation: 420 R Axis:   88  Text Interpretation: Unusual P axis, possible ectopic atrial tachycardia Nonspecific ST and T wave abnormality When compared with ECG of 18-Nov-2023 15:45, PREVIOUS ECG IS PRESENT Confirmed by Parthenia Klinefelter [phone]) on 12/14/2023 12:12:14 PM    Prior CV Studies:   Echo 9/525  IMPRESSIONS     1. Left ventricular ejection fraction, by estimation, is 60 to 65%. The  left ventricle has normal function. The left ventricle has no regional  wall motion abnormalities. There is mild concentric left ventricular  hypertrophy. Indeterminate diastolic  filling due to E-A fusion.   2. Right ventricular systolic function is moderately reduced. The right  ventricular size is moderately enlarged. Tricuspid regurgitation signal is  inadequate for assessing PA pressure.   3. The mitral valve is normal in structure. No evidence of mitral valve  regurgitation. No evidence of mitral stenosis.   4. The aortic valve is tricuspid. There is mild calcification of the  aortic valve. Aortic valve regurgitation is not visualized. Aortic valve  sclerosis/calcification is present, without any evidence of aortic  stenosis.   5. The inferior vena cava is normal in size with greater  than 50%  respiratory variability, suggesting right atrial pressure of 3 mmHg.  cho 11/20/23   Risk Assessment/Calculations:             Physical Exam:   VS:  BP 100/80   Pulse (!) 109   Ht 5' 2 (1.575 m)   Wt (!) 350 lb 3.2 oz (158.8 kg)   SpO2 (!) 87%   BMI 64.05 kg/m    Orhtostatics: No data found. Wt Readings from Last 3 Encounters:  12/14/23 (!) 350 lb 3.2 oz (158.8 kg)  11/23/23 (!) 333 lb (151 kg)  11/20/23 (!) 334 lb (151.5 kg)    GEN: Obese, in no acute distress NECK: No JVD; No carotid bruits CARDIAC:   RRR 111/m, no murmurs, rubs, gallops RESPIRATORY:  Clear to auscultation without rales, wheezing or rhonchi  ABDOMEN: Soft, non-tender, non-distended EXTREMITIES:  No edema; No deformity   ASSESSMENT AND PLAN: .    HFrEF now normal LVEF with moderated reduced RV. O2 saats 87%, HR 111/m increased DOE- and activity intolerance.  Weight today 350 lbs.  Echocardiogram 11/24 showed LVEF of 45-50%, normal diastolic parameters, mildly reduced RV function, and trivial tricuspid valve regurgitation. Echo 11/19/23 normal LVEF, moderately reduced RV function. Heart healthy low-sodium diet Daily weights Continue Jardiance , reduced spironolactone , torsemide -prn Lower extremity support stockings Elevate lower extremities when not active Following with advanced heart failure team -reduce and stop THC use  Hypertension-BP today 100/80. Maintain blood pressure log Reduce spironolactone  12.5 mg daily, torsemide  20 mg daily for a week then prn  OSA-reports compliance with BiPAP. Sleep hygiene instructions Continue weight loss  History of PE-reports compliance with Xarelto .  Denies bleeding issues. Following with PCP  Morbid obesity-weight today 350 lbs.eating poorly and ran out of torsemide  Reduced calorie diet Continue GLP-1 Refer to healthy weight loss center  Substance abuse-smokes a lot of marijuana. Discussed importance of cessation.           Dispo: f/u with Dr. Zenaida  Signed, Olivia Pavy, PA-C

## 2023-12-14 ENCOUNTER — Other Ambulatory Visit (HOSPITAL_COMMUNITY): Payer: Self-pay

## 2023-12-14 ENCOUNTER — Ambulatory Visit: Admitting: Physician Assistant

## 2023-12-14 ENCOUNTER — Ambulatory Visit: Attending: Physician Assistant | Admitting: Physician Assistant

## 2023-12-14 ENCOUNTER — Encounter: Payer: Self-pay | Admitting: Physician Assistant

## 2023-12-14 VITALS — BP 100/80 | HR 109 | Ht 62.0 in | Wt 350.2 lb

## 2023-12-14 DIAGNOSIS — G4733 Obstructive sleep apnea (adult) (pediatric): Secondary | ICD-10-CM | POA: Insufficient documentation

## 2023-12-14 DIAGNOSIS — I5022 Chronic systolic (congestive) heart failure: Secondary | ICD-10-CM | POA: Insufficient documentation

## 2023-12-14 DIAGNOSIS — F1910 Other psychoactive substance abuse, uncomplicated: Secondary | ICD-10-CM | POA: Insufficient documentation

## 2023-12-14 DIAGNOSIS — I2699 Other pulmonary embolism without acute cor pulmonale: Secondary | ICD-10-CM | POA: Insufficient documentation

## 2023-12-14 DIAGNOSIS — E66813 Obesity, class 3: Secondary | ICD-10-CM | POA: Diagnosis present

## 2023-12-14 DIAGNOSIS — I10 Essential (primary) hypertension: Secondary | ICD-10-CM | POA: Diagnosis present

## 2023-12-14 MED ORDER — TORSEMIDE 20 MG PO TABS: 20.0000 mg | ORAL_TABLET | Freq: Every day | ORAL | 3 refills | Status: DC

## 2023-12-14 MED ORDER — LOSARTAN POTASSIUM 25 MG PO TABS
12.5000 mg | ORAL_TABLET | Freq: Every day | ORAL | 1 refills | Status: DC
  Filled 2023-12-14: qty 45, 90d supply, fill #0

## 2023-12-14 MED ORDER — TORSEMIDE 20 MG PO TABS
20.0000 mg | ORAL_TABLET | Freq: Every day | ORAL | 1 refills | Status: DC | PRN
  Filled 2023-12-14: qty 90, 90d supply, fill #0

## 2023-12-14 NOTE — Patient Instructions (Addendum)
 Medication Instructions:   START TAKING :  LOSARTAN   12.5 MG ONCE  DAY   FOR ONE WEEK ONLY :  TAKE TORSEMIDE   20 MG  ONCE  DAY THEN TAKE  AS NEEDED      *If you need a refill on your cardiac medications before your next appointment, please call your pharmacy*   Lab Work: NONE ORDERED  TODAY   If you have labs (blood work) drawn today and your tests are completely normal, you will receive your results only by: MyChart Message (if you have MyChart) OR A paper copy in the mail If you have any lab test that is abnormal or we need to change your treatment, we will call you to review the results.  Testing/Procedures: NONE ORDERED  TODAY    Follow-Up: At [facility], you and your health needs are our priority.  As part of our continuing mission to provide you with exceptional heart care, our providers are all part of one team.  This team includes your primary Cardiologist (physician) and Advanced Practice Providers or APPs (Physician Assistants and Nurse Practitioners) who all work together to provide you with the care you need, when you need it.  Your next appointment:  YOU HAVE BEEN  REFERRED TO  HE[facility] FAILURE [facility]IN[facility] DR ZENAIDA.     YOU HAVE BEEN REFERRED TO [facility]IGHT [facility] CLINIC FOR WEIGHT  MANAGEMENT    We recommend signing up for the patient portal called MyChart.  Sign up information is provided on this After Visit Summary.  MyChart is used to connect with patients for Virtual Visits (Telemedicine).  Patients are able to view lab/test results, encounter notes, upcoming appointments, etc.  Non-urgent messages can be sent to your provider as well.   To learn more about what you can do with MyChart, go to [email or web address].   Other Instructions  Low-Sodium Eating Plan Salt (sodium) helps you keep a healthy balance of fluids in your body. Too much sodium can raise your blood pressure. It can also cause fluid and waste to be held in your body. Your health care  provider or dietitian may recommend a low-sodium eating plan if you have high blood pressure (hypertension), kidney disease, liver disease, or heart failure. Eating less sodium can help lower your blood pressure and reduce swelling. It can also protect your heart, liver, and kidneys. What are tips for following this plan? Reading food labels  Check food labels for the amount of sodium per serving. If you eat more than one serving, you must multiply the listed amount by the number of servings. Choose foods with less than 140 milligrams (mg) of sodium per serving. Avoid foods with 300 mg of sodium or more per serving. Always check how much sodium is in a product, even if the label says unsalted or no salt added. Shopping  Buy products labeled as low-sodium or no salt added. Buy fresh foods. Avoid canned foods and pre-made or frozen meals. Avoid canned, cured, or processed meats. Buy breads that have less than 80 mg of sodium per slice. Cooking  Eat more home-cooked food. Try to eat less restaurant, buffet, and fast food. Try not to add salt when you cook. Use salt-free seasonings or herbs instead of table salt or sea salt. Check with your provider or pharmacist before using salt substitutes. Cook with plant-based oils, such as canola, sunflower, or olive oil. Meal planning When eating at a restaurant, ask if your food can be made with less salt or  no salt. Avoid dishes labeled as brined, pickled, cured, or smoked. Avoid dishes made with soy sauce, miso, or teriyaki sauce. Avoid foods that have monosodium glutamate (MSG) in them. MSG may be added to some restaurant food, sauces, soups, bouillon, and canned foods. Make meals that can be grilled, baked, poached, roasted, or steamed. These are often made with less sodium. General information Try to limit your sodium intake to 1,500-2,300 mg each day, or the amount told by your provider. What foods should I eat? Fruits Fresh, frozen, or  canned fruit. Fruit juice. Vegetables Fresh or frozen vegetables. No salt added canned vegetables. No salt added tomato sauce and paste. Low-sodium or reduced-sodium tomato and vegetable juice. Grains Low-sodium cereals, such as oats, puffed wheat and rice, and shredded wheat. Low-sodium crackers. Unsalted rice. Unsalted pasta. Low-sodium bread. Whole grain breads and whole grain pasta. Meats and other proteins Fresh or frozen meat, poultry, seafood, and fish. These should have no added salt. Low-sodium canned tuna and salmon. Unsalted nuts. Dried peas, beans, and lentils without added salt. Unsalted canned beans. Eggs. Unsalted nut butters. Dairy Milk. Soy milk. Cheese that is naturally low in sodium, such as ricotta cheese, fresh mozzarella, or Swiss cheese. Low-sodium or reduced-sodium cheese. Cream cheese. Yogurt. Seasonings and condiments Fresh and dried herbs and spices. Salt-free seasonings. Low-sodium mustard and ketchup. Sodium-free salad dressing. Sodium-free light mayonnaise. Fresh or refrigerated horseradish. Lemon juice. Vinegar. Other foods Homemade, reduced-sodium, or low-sodium soups. Unsalted popcorn and pretzels. Low-salt or salt-free chips. The items listed above may not be all the foods and drinks you can have. Talk to a dietitian to learn more. What foods should I avoid? Vegetables Sauerkraut, pickled vegetables, and relishes. Olives. [place] fries. Onion rings. Regular canned vegetables, except low-sodium or reduced-sodium items. Regular canned tomato sauce and paste. Regular tomato and vegetable juice. Frozen vegetables in sauces. Grains Instant hot cereals. Bread stuffing, pancake, and biscuit mixes. Croutons. Seasoned rice or pasta mixes. Noodle soup cups. Boxed or frozen macaroni and cheese. Regular salted crackers. Self-rising flour. Meats and other proteins Meat or fish that is salted, canned, smoked, spiced, or pickled. Precooked or cured meat, such as sausages or  meat loaves. [place]. Ham. Pepperoni. Hot dogs. Corned beef. Chipped beef. Salt pork. Jerky. Pickled herring, anchovies, and sardines. Regular canned tuna. Salted nuts. Dairy Processed cheese and cheese spreads. Hard cheeses. Cheese curds. Blue cheese. Feta cheese. String cheese. Regular cottage cheese. Buttermilk. Canned milk. Fats and oils Salted butter. Regular margarine. Ghee. Bacon fat. Seasonings and condiments Onion salt, garlic salt, seasoned salt, table salt, and sea salt. Canned and packaged gravies. Worcestershire sauce. Tartar sauce. Barbecue sauce. Teriyaki sauce. Soy sauce, including reduced-sodium soy sauce. Steak sauce. Fish sauce. Oyster sauce. Cocktail sauce. Horseradish that you find on the shelf. Regular ketchup and mustard. Meat flavorings and tenderizers. Bouillon cubes. Hot sauce. Pre-made or packaged marinades. Pre-made or packaged taco seasonings. Relishes. Regular salad dressings. Salsa. Other foods Salted popcorn and pretzels. Corn chips and puffs. Potato and tortilla chips. Canned or dried soups. Pizza. Frozen entrees and pot pies. The items listed above may not be all the foods and drinks you should avoid. Talk to a dietitian to learn more. This information is not intended to replace advice given to you by your health care provider. Make sure you discuss any questions you have with your health care provider. Document Revised: 03/20/2022 Document Reviewed: 03/20/2022 Elsevier Patient Education  2024 [employer].

## 2023-12-21 ENCOUNTER — Institutional Professional Consult (permissible substitution): Admitting: Nurse Practitioner

## 2023-12-31 ENCOUNTER — Other Ambulatory Visit: Payer: Self-pay

## 2023-12-31 ENCOUNTER — Emergency Department (HOSPITAL_COMMUNITY)

## 2023-12-31 ENCOUNTER — Emergency Department (HOSPITAL_COMMUNITY)
Admission: EM | Admit: 2023-12-31 | Discharge: 2023-12-31 | Disposition: A | Discharge status: Home / Self Care | Attending: Emergency Medicine | Admitting: Emergency Medicine

## 2023-12-31 ENCOUNTER — Encounter (HOSPITAL_COMMUNITY): Payer: Self-pay

## 2023-12-31 DIAGNOSIS — R0789 Other chest pain: Secondary | ICD-10-CM | POA: Insufficient documentation

## 2023-12-31 DIAGNOSIS — R000 Tachycardia, unspecified: Secondary | ICD-10-CM | POA: Diagnosis not present

## 2023-12-31 DIAGNOSIS — R600 Localized edema: Secondary | ICD-10-CM | POA: Insufficient documentation

## 2023-12-31 DIAGNOSIS — R079 Chest pain, unspecified: Secondary | ICD-10-CM | POA: Diagnosis present

## 2023-12-31 DIAGNOSIS — D72829 Elevated white blood cell count, unspecified: Secondary | ICD-10-CM | POA: Insufficient documentation

## 2023-12-31 DIAGNOSIS — I509 Heart failure, unspecified: Secondary | ICD-10-CM | POA: Insufficient documentation

## 2023-12-31 DIAGNOSIS — M7989 Other specified soft tissue disorders: Secondary | ICD-10-CM | POA: Insufficient documentation

## 2023-12-31 DIAGNOSIS — I110 Hypertensive heart disease with heart failure: Secondary | ICD-10-CM | POA: Insufficient documentation

## 2023-12-31 DIAGNOSIS — Z7901 Long term (current) use of anticoagulants: Secondary | ICD-10-CM | POA: Diagnosis not present

## 2023-12-31 DIAGNOSIS — R202 Paresthesia of skin: Secondary | ICD-10-CM | POA: Diagnosis not present

## 2023-12-31 DIAGNOSIS — R0602 Shortness of breath: Secondary | ICD-10-CM | POA: Insufficient documentation

## 2023-12-31 DIAGNOSIS — Z86711 Personal history of pulmonary embolism: Secondary | ICD-10-CM | POA: Insufficient documentation

## 2023-12-31 DIAGNOSIS — Z79899 Other long term (current) drug therapy: Secondary | ICD-10-CM | POA: Diagnosis not present

## 2023-12-31 LAB — URINALYSIS, ROUTINE W REFLEX MICROSCOPIC
Bacteria, UA: NONE SEEN
Bilirubin Urine: NEGATIVE
Glucose, UA: NEGATIVE mg/dL
Hgb urine dipstick: NEGATIVE
Ketones, ur: NEGATIVE mg/dL
Leukocytes,Ua: NEGATIVE
Nitrite: NEGATIVE
Protein, ur: 100 mg/dL — AB
Specific Gravity, Urine: 1.033 — ABNORMAL HIGH (ref 1.005–1.030)
pH: 5 (ref 5.0–8.0)

## 2023-12-31 LAB — COMPREHENSIVE METABOLIC PANEL WITH GFR
ALT: 15 U/L (ref 0–44)
AST: 24 U/L (ref 15–41)
Albumin: 3.1 g/dL — ABNORMAL LOW (ref 3.5–5.0)
Alkaline Phosphatase: 121 U/L (ref 38–126)
Anion gap: 11 (ref 5–15)
BUN: 8 mg/dL (ref 6–20)
CO2: 27 mmol/L (ref 22–32)
Calcium: 8.7 mg/dL — ABNORMAL LOW (ref 8.9–10.3)
Chloride: 97 mmol/L — ABNORMAL LOW (ref 98–111)
Creatinine, Ser: 0.71 mg/dL (ref 0.44–1.00)
GFR, Estimated: >60 mL/min (ref >60)
Glucose, Bld: 74 mg/dL (ref 70–99)
Potassium: 4.8 mmol/L (ref 3.5–5.1)
Sodium: 135 mmol/L (ref 135–145)
Total Bilirubin: 0.7 mg/dL (ref 0.0–1.2)
Total Protein: 7 g/dL (ref 6.5–8.1)

## 2023-12-31 LAB — CBC WITH DIFFERENTIAL/PLATELET
Abs Immature Granulocytes: 0.05 K/uL (ref 0.00–0.07)
Basophils Absolute: 0.1 K/uL (ref 0.0–0.1)
Basophils Relative: 0 %
Eosinophils Absolute: 0.4 K/uL (ref 0.0–0.5)
Eosinophils Relative: 2 %
HCT: 43.6 % (ref 36.0–46.0)
Hemoglobin: 13.7 g/dL (ref 12.0–15.0)
Immature Granulocytes: 0 %
Lymphocytes Relative: 27 %
Lymphs Abs: 3.9 K/uL (ref 0.7–4.0)
MCH: 23.1 pg — ABNORMAL LOW (ref 26.0–34.0)
MCHC: 31.4 g/dL (ref 30.0–36.0)
MCV: 73.5 fL — ABNORMAL LOW (ref 80.0–100.0)
Monocytes Absolute: 0.9 K/uL (ref 0.1–1.0)
Monocytes Relative: 6 %
Neutro Abs: 9.4 K/uL — ABNORMAL HIGH (ref 1.7–7.7)
Neutrophils Relative %: 65 %
Platelets: 375 K/uL (ref 150–400)
RBC: 5.93 MIL/uL — ABNORMAL HIGH (ref 3.87–5.11)
RDW: 19.3 % — ABNORMAL HIGH (ref 11.5–15.5)
WBC: 14.7 K/uL — ABNORMAL HIGH (ref 4.0–10.5)
nRBC: 0.1 % (ref 0.0–0.2)

## 2023-12-31 LAB — RESP PANEL BY RT-PCR (RSV, FLU A&B, COVID)  RVPGX2
Influenza A by PCR: NEGATIVE
Influenza B by PCR: NEGATIVE
Resp Syncytial Virus by PCR: NEGATIVE
SARS Coronavirus 2 by RT PCR: NEGATIVE

## 2023-12-31 LAB — TROPONIN I (HIGH SENSITIVITY)
Troponin I (High Sensitivity): 12 ng/L (ref <18)
Troponin I (High Sensitivity): 13 ng/L (ref <18)

## 2023-12-31 LAB — D-DIMER, QUANTITATIVE: D-Dimer, Quant: <0.27 ug{FEU}/mL (ref 0.00–0.50)

## 2023-12-31 LAB — BRAIN NATRIURETIC PEPTIDE: B Natriuretic Peptide: 112.8 pg/mL — ABNORMAL HIGH (ref 0.0–100.0)

## 2023-12-31 NOTE — ED Notes (Signed)
Shift report received, assumed care of patient at this time 

## 2023-12-31 NOTE — ED Provider Notes (Signed)
 [facility]ERG[facility] DEPARTMENT AT [facility] Provider Note   CSN: [id number] Arrival date & time: 12/31/23  1558     Patient presents with: Chest Pain and Shortness of Breath   Christine Singleton is a 33 y.o. female.  33 year old female presents to the ED with complaints of shortness of breath and chest pain.  Patient reports chest pain has been going on for roughly 1 week but increased in severity this morning.  Patient describes the pain as pressure on her chest that exacerbates with movement.  Patient endorses shortness of breath for approximately 1 week as well.  Patient has significant history of CHF and was recently taking 2 as needed 40 mg of Lasix  per day by cardiologist.  Patient advises her weight went up from 333-345 and she started using her Lasix  again without decrease in weight.  The last couple days she has increased her dose to 80 mg/day without any relief of symptoms.  Patient is on Eliquis  due to PE in the past.  Patient does endorse some shortness of breath on exertion and advised that she feels very swollen and she feels like her legs have increased in swelling as well.  Patient advises she has oxygen at home for as needed use but lately has been having to use it every single night.  She also has CPAP she uses nightly as well.  Patient endorses associated tingling in both hands for several weeks but denies any numbness.  Patient is not on any potassium supplements and is currently reporting 6 out of 10 pain in her chest.     Prior to Admission medications   Medication Sig Start Date End Date Taking? Authorizing Provider  cetirizine (ZYRTEC) 10 MG tablet Take 10 mg by mouth daily as needed for allergies.    [provider]  empagliflozin  (JARDIANCE ) 10 MG TABS tablet Take 1 tablet (10 mg total) by mouth daily. 11/20/23   Arrien, Mauricio Daniel, MD  guaiFENesin  (MUCINEX ) 600 MG 12 hr tablet Take 600 mg by mouth 2 (two) times daily as needed for cough or to  loosen phlegm.    [provider]  losartan  (COZAAR ) 25 MG tablet Take 1/2 tablet (12.5 mg total) by mouth daily. 12/14/23 03/13/24  Parthenia Olivia HERO, PA-C  metFORMIN  (GLUCOPHAGE ) 500 MG tablet Take 1 tablet (500 mg total) by mouth daily with breakfast. 11/20/23   Arrien, Elidia Sieving, MD  OXYGEN Inhale 3 L/min into the lungs at bedtime.    [provider]  PRESCRIPTION MEDICATION See admin instructions. CPAP- At bedtime    [provider]  rivaroxaban  (XARELTO ) 20 MG TABS tablet Take 1 tablet (20 mg total) by mouth daily. 10/21/23   Zenaida Morene PARAS, MD  spironolactone  (ALDACTONE ) 25 MG tablet Take 1 tablet (25 mg total) by mouth daily. 11/20/23 12/20/23  Arrien, Mauricio Daniel, MD  torsemide  (DEMADEX ) 20 MG tablet Take 1 tablet (20 mg total) by mouth daily as needed (SWELLING). 12/14/23   Parthenia Olivia HERO, PA-C  TYLENOL  500 MG tablet Take 1 tablet (500 mg total) by mouth every 6 (six) hours as needed (for headaches). 11/20/23   Arrien, Elidia Sieving, MD    Allergies: Strawberry extract    Review of Systems  Constitutional:  Positive for activity change and fatigue.  Respiratory:  Positive for shortness of breath.   Cardiovascular:  Positive for chest pain and leg swelling.  All other systems reviewed and are negative.   Updated Vital Signs BP 111/82  Pulse 99   Temp 98.3 F (36.8 C) (Oral)   Resp 17   Ht 5' 2 (1.575 m)   Wt (!) 160.1 kg   SpO2 90%   BMI 64.56 kg/m   Physical Exam Vitals and nursing note reviewed.  Constitutional:      Appearance: Normal appearance. She is obese.  HENT:     Head: Normocephalic and atraumatic.     Nose: Nose normal.  Eyes:     Extraocular Movements: Extraocular movements intact.     Conjunctiva/sclera: Conjunctivae normal.     Pupils: Pupils are equal, round, and reactive to light.  Cardiovascular:     Rate and Rhythm: Tachycardia present.     Heart sounds: Normal heart sounds.  Pulmonary:     Effort: Tachypnea  present. No respiratory distress.     Breath sounds: Normal breath sounds.  Chest:     Chest wall: No tenderness.  Abdominal:     Palpations: There is no fluid wave.     Tenderness: There is no abdominal tenderness.  Musculoskeletal:        General: Normal range of motion.     Cervical back: Normal range of motion.     Right lower leg: Edema present.     Left lower leg: Edema present.  Skin:    General: Skin is warm.     Capillary Refill: Capillary refill takes less than 2 seconds.  Neurological:     General: No focal deficit present.     Mental Status: She is alert.  Psychiatric:        Mood and Affect: Mood normal.        Behavior: Behavior normal.     (all labs ordered are listed, but only abnormal results are displayed) Labs Reviewed  URINALYSIS, ROUTINE W REFLEX MICROSCOPIC - Abnormal; Notable for the following components:      Result Value   Color, Urine AMBER (*)    APPearance HAZY (*)    Specific Gravity, Urine 1.033 (*)    Protein, ur 100 (*)    All other components within normal limits  COMPREHENSIVE METABOLIC PANEL WITH GFR - Abnormal; Notable for the following components:   Chloride 97 (*)    Calcium 8.7 (*)    Albumin 3.1 (*)    All other components within normal limits  CBC WITH DIFFERENTIAL/PLATELET - Abnormal; Notable for the following components:   WBC 14.7 (*)    RBC 5.93 (*)    MCV 73.5 (*)    MCH 23.1 (*)    RDW 19.3 (*)    Neutro Abs 9.4 (*)    All other components within normal limits  BRAIN NATRIURETIC PEPTIDE - Abnormal; Notable for the following components:   B Natriuretic Peptide 112.8 (*)    All other components within normal limits  RESP PANEL BY RT-PCR (RSV, FLU A&B, COVID)  RVPGX2  D-DIMER, QUANTITATIVE (NOT AT [facility])  PREGNANCY, URINE  TROPONIN I (HIGH SENSITIVITY)  TROPONIN I (HIGH SENSITIVITY)    EKG: None  Radiology: DG Chest 2 View Result Date: 12/31/2023 CLINICAL DATA:  Shortness of breath with bilateral hand and feet  numbness and tingling. EXAM: CHEST - 2 VIEW COMPARISON:  November 18, 2023 FINDINGS: The heart size and mediastinal contours are within normal limits. Low lung volumes are noted with subsequent crowding of the bronchovascular lung markings. Breast attenuation artifact is seen bilaterally. Mild prominence of the central pulmonary vasculature is noted. No acute infiltrate, pleural effusion or pneumothorax is identified.  The visualized skeletal structures are unremarkable. IMPRESSION: Low lung volumes with mild central pulmonary vascular congestion. Electronically Signed   By: Suzen Dials M.D.   On: 12/31/2023 17:22     Procedures   Medications Ordered in the ED - No data to display  33 y.o. female presents to the ED with complaints of shortness of breath and chest pain, this involves an extensive number of treatment options, and is a complaint that carries with it a high risk of complications and morbidity.  The differential diagnosis includes CHF exacerbation, ACS, cardiac tamponade, aortic dissection, pneumothorax, pneumonia, pulmonary effusion, (Ddx)  On arrival pt is nontoxic, vitals unremarkable.   Additional history obtained from chart review significant for patient being followed by cardiology for history of PE, heart failure, and hypertension.  Patient has been placed on a GLP-1 for obesity, uses BiPAP for sleep apnea, spironolactone  and torsemide  for hypertension, and Xarelto  for pulmonary embolism.  Lab Tests:  I Ordered, reviewed, and interpreted labs, which included: CMP, CBC, troponin, BNP, D-dimer, UA, pregnancy, respiratory panel  Imaging Studies ordered:  I ordered imaging studies which included chest x-ray, I independently visualized and interpreted imaging which showed mild congestion.  ED Course:   33 year old female sitting comfortably in ED bed brought in by EMS.  Patient has complaint of chest pain and shortness of breath.  Patient is satting at 100% on 4 L of oxygen  through nasal cannula.  Patient is not typically on oxygen at home but does have it as needed.  Patient has BMI of 65 and reports increased weight gain due to water retention.  There is no obvious pitting edema, patient reports that she feels like her legs have gotten swollen over the last week.  Lungs are clear to auscultation and no rhonchi or rales noted.  Patient does not endorse any orthopnea.  Patient has shortness of breath exacerbated by exertion and feels more bloated than her baseline.  Chest pain has been persistent for roughly 1 week but has increased in severity this morning 6 out of 10 feeling like a pressure with no radiation.  And patient also has reported tingling in hands.  Patient denies any potassium supplement.  Initial plan will be to order labs and EKG for cardiac etiology, x-ray for assessment of pulmonary edema, D-dimer for pulmonary embolism, and CMP for electrolytes and abdominal etiologies.  Patient was trialed on room air with some desaturation at 90% which quickly returns to 95 to 100%.  CMP was unremarkable, CBC had leukocytosis no other concerning findings.  Patient has not reported any fevers, nausea, diarrhea, or cough. BNP was mildly elevated patient has diagnosed CHF managed by pulmonology.  UA was unremarkable for infection.  Dimer was negative and patient is taking blood thinners prescribed.  RSV COVID flu negative as well.  X-ray noted some mild vascular congestion.  Lungs are clear to auscultation all fields and on reassessment patient was satting at 98 to 100% on room air.  Patient reports some shortness of breath when she gets up and moves around but has ambulated to the bathroom without difficulty.  Patient does have at home oxygen she can use as needed.  Patient reports she feels comfortable going home.  Troponins were negative and no concerning findings on EKG.  It is reasonable for patient to go home and use prescribed Lasix  and at home oxygen as needed.  Patient was  advised of strict return precautions including increasing shortness of breath, orthopnea, or worsening chest pain.  Patient was advised to follow-up with primary care or pulmonology.  Patient agreed with treatment plan and felt comfortable discharge.  Portions of this note were generated with Scientist, clinical (histocompatibility and immunogenetics). Dictation errors may occur despite best attempts at proofreading.   Final diagnoses:  Shortness of breath  Atypical chest pain    ED Discharge Orders     None          Myriam Fonda RAMAN, PA-C 01/01/24 1023    Pamella Ozell LABOR, DO 01/08/24 1617

## 2023-12-31 NOTE — ED Triage Notes (Signed)
 Coming from urgent care - Hx CHF, PE. The patient feels like she has been retaining fluid in spite of maintaining her medication regimen. She has had increased SOB, chest pain. With EMS initially satted at 88, but was placed on O2 4L. Usually uses 3L but only at night. Pt also complains of numbness and tingling in her bilateral hands and feet for the last couple weeks.    Meds with EMS: 324 aspirin  0.4 Nitro  IV: 22G in R hand  Vitals with EMS: BP 141/88 HR 97 O2 97 on 4L CBG 85 RR 22

## 2023-12-31 NOTE — ED Notes (Addendum)
 Provided patient with OJ for CBG 74.

## 2023-12-31 NOTE — Discharge Instructions (Signed)
 Lab work and exam are reassuring today.  It is advised to use your at home oxygen if you begin to feel short of breath.  Continue to take your Lasix  as prescribed.  It is advised to follow-up with your primary care doctor for further evaluation and treatment of congestive heart failure.  If symptoms worsen or new concerning symptoms arise please return to the ED for further evaluation.  Some of the symptoms included worsening shortness of breath or worsening chest pain.

## 2023-12-31 NOTE — ED Notes (Signed)
 Patient ambulated to bathroom without assistance. NADN

## 2024-01-02 ENCOUNTER — Other Ambulatory Visit: Payer: Self-pay

## 2024-01-02 ENCOUNTER — Inpatient Hospital Stay (HOSPITAL_BASED_OUTPATIENT_CLINIC_OR_DEPARTMENT_OTHER)
Admission: EM | Admit: 2024-01-02 | Discharge: 2024-01-05 | DRG: 291 | Disposition: A | Discharge status: Home / Self Care | Attending: Internal Medicine | Admitting: Internal Medicine

## 2024-01-02 ENCOUNTER — Encounter (HOSPITAL_BASED_OUTPATIENT_CLINIC_OR_DEPARTMENT_OTHER): Payer: Self-pay

## 2024-01-02 ENCOUNTER — Emergency Department (HOSPITAL_BASED_OUTPATIENT_CLINIC_OR_DEPARTMENT_OTHER)

## 2024-01-02 DIAGNOSIS — I509 Heart failure, unspecified: Secondary | ICD-10-CM

## 2024-01-02 DIAGNOSIS — Z79899 Other long term (current) drug therapy: Secondary | ICD-10-CM

## 2024-01-02 DIAGNOSIS — Z825 Family history of asthma and other chronic lower respiratory diseases: Secondary | ICD-10-CM

## 2024-01-02 DIAGNOSIS — J9601 Acute respiratory failure with hypoxia: Secondary | ICD-10-CM | POA: Diagnosis present

## 2024-01-02 DIAGNOSIS — I5043 Acute on chronic combined systolic (congestive) and diastolic (congestive) heart failure: Secondary | ICD-10-CM | POA: Insufficient documentation

## 2024-01-02 DIAGNOSIS — I110 Hypertensive heart disease with heart failure: Principal | ICD-10-CM | POA: Diagnosis present

## 2024-01-02 DIAGNOSIS — Z7901 Long term (current) use of anticoagulants: Secondary | ICD-10-CM

## 2024-01-02 DIAGNOSIS — I5033 Acute on chronic diastolic (congestive) heart failure: Principal | ICD-10-CM | POA: Diagnosis present

## 2024-01-02 DIAGNOSIS — E871 Hypo-osmolality and hyponatremia: Secondary | ICD-10-CM | POA: Diagnosis present

## 2024-01-02 DIAGNOSIS — E119 Type 2 diabetes mellitus without complications: Secondary | ICD-10-CM | POA: Diagnosis present

## 2024-01-02 DIAGNOSIS — Z83438 Family history of other disorder of lipoprotein metabolism and other lipidemia: Secondary | ICD-10-CM

## 2024-01-02 DIAGNOSIS — Z7984 Long term (current) use of oral hypoglycemic drugs: Secondary | ICD-10-CM

## 2024-01-02 DIAGNOSIS — Z86711 Personal history of pulmonary embolism: Secondary | ICD-10-CM

## 2024-01-02 DIAGNOSIS — Z820 Family history of epilepsy and other diseases of the nervous system: Secondary | ICD-10-CM

## 2024-01-02 DIAGNOSIS — Z8249 Family history of ischemic heart disease and other diseases of the circulatory system: Secondary | ICD-10-CM

## 2024-01-02 DIAGNOSIS — E66813 Obesity, class 3: Secondary | ICD-10-CM | POA: Diagnosis present

## 2024-01-02 DIAGNOSIS — Z1152 Encounter for screening for COVID-19: Secondary | ICD-10-CM

## 2024-01-02 DIAGNOSIS — Z6841 Body Mass Index (BMI) 40.0 and over, adult: Secondary | ICD-10-CM

## 2024-01-02 DIAGNOSIS — G4733 Obstructive sleep apnea (adult) (pediatric): Secondary | ICD-10-CM | POA: Diagnosis present

## 2024-01-02 DIAGNOSIS — Z9102 Food additives allergy status: Secondary | ICD-10-CM

## 2024-01-02 DIAGNOSIS — D509 Iron deficiency anemia, unspecified: Secondary | ICD-10-CM | POA: Diagnosis present

## 2024-01-02 DIAGNOSIS — I10 Essential (primary) hypertension: Secondary | ICD-10-CM | POA: Diagnosis present

## 2024-01-02 LAB — CBC WITH DIFFERENTIAL/PLATELET
Abs Immature Granulocytes: 0.09 K/uL — ABNORMAL HIGH (ref 0.00–0.07)
Basophils Absolute: 0.1 K/uL (ref 0.0–0.1)
Basophils Relative: 1 %
Eosinophils Absolute: 0.2 K/uL (ref 0.0–0.5)
Eosinophils Relative: 2 %
HCT: 39.9 % (ref 36.0–46.0)
Hemoglobin: 12.9 g/dL (ref 12.0–15.0)
Immature Granulocytes: 1 %
Lymphocytes Relative: 25 %
Lymphs Abs: 3.8 K/uL (ref 0.7–4.0)
MCH: 23.2 pg — ABNORMAL LOW (ref 26.0–34.0)
MCHC: 32.3 g/dL (ref 30.0–36.0)
MCV: 71.9 fL — ABNORMAL LOW (ref 80.0–100.0)
Monocytes Absolute: 0.9 K/uL (ref 0.1–1.0)
Monocytes Relative: 6 %
Neutro Abs: 10.2 K/uL — ABNORMAL HIGH (ref 1.7–7.7)
Neutrophils Relative %: 65 %
Platelets: 405 K/uL — ABNORMAL HIGH (ref 150–400)
RBC: 5.55 MIL/uL — ABNORMAL HIGH (ref 3.87–5.11)
RDW: 18.7 % — ABNORMAL HIGH (ref 11.5–15.5)
WBC: 15.3 K/uL — ABNORMAL HIGH (ref 4.0–10.5)
nRBC: 0.3 % — ABNORMAL HIGH (ref 0.0–0.2)

## 2024-01-02 LAB — PRO BRAIN NATRIURETIC PEPTIDE: Pro Brain Natriuretic Peptide: 1588 pg/mL — ABNORMAL HIGH (ref <300.0)

## 2024-01-02 LAB — URINE DRUG SCREEN
Amphetamines: NEGATIVE
Barbiturates: NEGATIVE
Benzodiazepines: NEGATIVE
Cocaine: NEGATIVE
Fentanyl: NEGATIVE
Methadone Scn, Ur: NEGATIVE
Opiates: NEGATIVE
Tetrahydrocannabinol: POSITIVE — AB

## 2024-01-02 LAB — BASIC METABOLIC PANEL WITH GFR
Anion gap: 10 (ref 5–15)
BUN: 9 mg/dL (ref 6–20)
CO2: 29 mmol/L (ref 22–32)
Calcium: 9.1 mg/dL (ref 8.9–10.3)
Chloride: 97 mmol/L — ABNORMAL LOW (ref 98–111)
Creatinine, Ser: 0.73 mg/dL (ref 0.44–1.00)
GFR, Estimated: >60 mL/min (ref >60)
Glucose, Bld: 98 mg/dL (ref 70–99)
Potassium: 4.4 mmol/L (ref 3.5–5.1)
Sodium: 136 mmol/L (ref 135–145)

## 2024-01-02 LAB — RESP PANEL BY RT-PCR (RSV, FLU A&B, COVID)  RVPGX2
Influenza A by PCR: NEGATIVE
Influenza B by PCR: NEGATIVE
Resp Syncytial Virus by PCR: NEGATIVE
SARS Coronavirus 2 by RT PCR: NEGATIVE

## 2024-01-02 LAB — HCG, SERUM, QUALITATIVE: Preg, Serum: NEGATIVE

## 2024-01-02 LAB — TROPONIN T, HIGH SENSITIVITY: Troponin T High Sensitivity: <15 ng/L (ref 0–19)

## 2024-01-02 MED ORDER — FUROSEMIDE 10 MG/ML IJ SOLN
60.0000 mg | Freq: Once | INTRAMUSCULAR | Status: AC
  Administered 2024-01-02: 60 mg via INTRAVENOUS
  Filled 2024-01-02: qty 6

## 2024-01-02 NOTE — ED Triage Notes (Signed)
 Pt reports worsening shortness of breath and CP since being seen on 10/16. Reports decreased activity tolerance and DOE.

## 2024-01-02 NOTE — ED Provider Notes (Signed)
 [place] [facility] Provider Note   CSN: [id number] Arrival date & time: 01/02/24  2058     Patient presents with: Shortness of Breath and Chest Pain   Christine Singleton is a 33 y.o. female.   33 year old female returns to the emergency room with progressively worsening shortness of breath.  History of CHF, states that she is short of breath with simple activities like putting on her shoes or talking at this time.  She has as needed on oxygen at home and states that she has been using this at night due to worsening shortness of breath.  She is also having some pain in her chest as well as a sharp pain in her low back and tingling in her fingers.  Patient came to the ER 2 days ago, was evaluated and discharged home.  She states that she was admitted to the hospital last week although on chart review, was last discharged in the hospital on September 3.  She states that while in the hospital she was provided with some any diuretics that on discharge she was told not to take her diuretics and so she states that she has not started taking them until yesterday where she took a double dose yesterday morning.  She reports taking furosemide .  Denies fevers, chills, cough or sick symptoms.  No other complaints or concerns. Reports THC use.       Prior to Admission medications   Medication Sig Start Date End Date Taking? Authorizing Provider  cetirizine (ZYRTEC) 10 MG tablet Take 10 mg by mouth daily as needed for allergies.    [provider]  empagliflozin  (JARDIANCE ) 10 MG TABS tablet Take 1 tablet (10 mg total) by mouth daily. 11/20/23   Arrien, Mauricio Daniel, MD  guaiFENesin  (MUCINEX ) 600 MG 12 hr tablet Take 600 mg by mouth 2 (two) times daily as needed for cough or to loosen phlegm.    [provider]  losartan  (COZAAR ) 25 MG tablet Take 1/2 tablet (12.5 mg total) by mouth daily. 12/14/23 03/13/24  Parthenia Olivia HERO, PA-C  metFORMIN   (GLUCOPHAGE ) 500 MG tablet Take 1 tablet (500 mg total) by mouth daily with breakfast. 11/20/23   Arrien, Elidia Sieving, MD  OXYGEN Inhale 3 L/min into the lungs at bedtime.    [provider]  PRESCRIPTION MEDICATION See admin instructions. CPAP- At bedtime    [provider]  rivaroxaban  (XARELTO ) 20 MG TABS tablet Take 1 tablet (20 mg total) by mouth daily. 10/21/23   Zenaida Morene PARAS, MD  spironolactone  (ALDACTONE ) 25 MG tablet Take 1 tablet (25 mg total) by mouth daily. 11/20/23 12/20/23  Arrien, Mauricio Daniel, MD  torsemide  (DEMADEX ) 20 MG tablet Take 1 tablet (20 mg total) by mouth daily as needed (SWELLING). 12/14/23   Parthenia Olivia HERO, PA-C  TYLENOL  500 MG tablet Take 1 tablet (500 mg total) by mouth every 6 (six) hours as needed (for headaches). 11/20/23   Arrien, Elidia Sieving, MD    Allergies: Strawberry extract    Review of Systems Negative except as per HPI Updated Vital Signs BP 118/86   Pulse 99   Temp 98.3 F (36.8 C) (Oral)   Resp 19   Ht 5' 2 (1.575 m)   Wt (!) 155.6 kg   LMP  (Within Months)   SpO2 98%   BMI 62.74 kg/m   Physical Exam Vitals and nursing note reviewed.  Constitutional:      General: She is not in  acute distress.    Appearance: She is well-developed. She is obese. She is not diaphoretic.  HENT:     Head: Normocephalic and atraumatic.  Cardiovascular:     Rate and Rhythm: Regular rhythm. Tachycardia present.  Pulmonary:     Effort: Pulmonary effort is normal.     Breath sounds: Examination of the right-lower field reveals decreased breath sounds. Examination of the left-lower field reveals decreased breath sounds. Decreased breath sounds present.  Chest:     Chest wall: No tenderness.  Musculoskeletal:     Right lower leg: No tenderness. No edema.     Left lower leg: No tenderness. No edema.  Skin:    General: Skin is warm and dry.     Findings: No erythema or rash.  Neurological:     Mental Status: She is alert and  oriented to person, place, and time.  Psychiatric:        Behavior: Behavior normal.     (all labs ordered are listed, but only abnormal results are displayed) Labs Reviewed  BASIC METABOLIC PANEL WITH GFR - Abnormal; Notable for the following components:      Result Value   Chloride 97 (*)    All other components within normal limits  CBC WITH DIFFERENTIAL/PLATELET - Abnormal; Notable for the following components:   WBC 15.3 (*)    RBC 5.55 (*)    MCV 71.9 (*)    MCH 23.2 (*)    RDW 18.7 (*)    Platelets 405 (*)    nRBC 0.3 (*)    Neutro Abs 10.2 (*)    Abs Immature Granulocytes 0.09 (*)    All other components within normal limits  PRO BRAIN NATRIURETIC PEPTIDE - Abnormal; Notable for the following components:   Pro Brain Natriuretic Peptide 1,588.0 (*)    All other components within normal limits  URINE DRUG SCREEN - Abnormal; Notable for the following components:   Tetrahydrocannabinol POSITIVE (*)    All other components within normal limits  RESP PANEL BY RT-PCR (RSV, FLU A&B, COVID)  RVPGX2  HCG, SERUM, QUALITATIVE  TROPONIN T, HIGH SENSITIVITY    EKG: None  Radiology: DG Chest Port 1 View Result Date: 01/02/2024 EXAM: 1 VIEW(S) XRAY OF THE CHEST 01/02/2024 09:31:00 PM COMPARISON: 12/31/2023 CLINICAL HISTORY: SHOB. Pt reports worsening shortness of breath and CP since being seen on 10/16. Reports decreased activity tolerance and DOE FINDINGS: LUNGS AND PLEURA: Peribronchial cuffing with diffuse interstitial prominence. Linear opacity in right mid lung. No pulmonary edema. No pleural effusion. No pneumothorax. HEART AND MEDIASTINUM: Cardiomegaly. BONES AND SOFT TISSUES: No acute osseous abnormality. IMPRESSION: 1. Findings favor CHF and pulmonary edema. Diffuse infection could appear similarly. Electronically signed by: Norman Gatlin MD 01/02/2024 09:40 PM EDT RP Workstation: [id number]     Procedures   Medications Ordered in the ED  furosemide  (LASIX ) injection  60 mg (60 mg Intravenous Given 01/02/24 2306)                                    Medical Decision Making Amount and/or Complexity of Data Reviewed Labs: ordered. Radiology: ordered.  Risk Prescription drug management. Decision regarding hospitalization.   This patient presents to the ED for concern of [facility], this involves an extensive number of treatment options, and is a complaint that carries with it a high risk of complications and morbidity.  The differential diagnosis includes but not limited to  CHF, pneumonia, PE, viral illness, ACS   Co morbidities / Chronic conditions that complicate the patient evaluation  CHF, class III obesity, OSA, PE (on Xarelto )   Additional history obtained:  Additional history obtained from EMR External records from outside source obtained and reviewed including prior echo dated 11/19/2023 with EF of 60 to 65%.   Lab Tests:  I Ordered, and personally interpreted labs.  The pertinent results include: CBC with WBC 15.3, not significantly changed from prior.  BMP without significant findings.  hCG is negative.  UDS is positive for THC.  BNP is notably elevated 1588.  Troponin is negative.  Negative for COVID, flu, RSV.   Imaging Studies ordered:  I ordered imaging studies including chest x-ray I independently visualized and interpreted imaging which showed vascular congestion I agree with the radiologist interpretation, do not suspect infection as patient does not have fever or sick symptoms.   Cardiac Monitoring: / EKG:  The patient was maintained on a cardiac monitor.  I personally viewed and interpreted the cardiac monitored which showed an underlying rhythm of: Sinus tachycardia rate 107   Problem List / ED Course / Critical interventions / Medication management  33 year old female with complex medical history including CHF presents with complaint of shortness of breath.  She reports after she was last discharged from the hospital for  CHF exacerbation, she was told to hold her diuretics.  She did not resume her diuretics until yesterday.  Review of records, was last discharged from the hospital over a month ago.  On exam, patient is able to speak in complete sentences, O2 sats 93% on room air, did dip down to 88% and returned when she was placed on 3 L nasal cannula.  She has diminished lung sounds in the bases.  She does not have significant lower extremity edema.  Patient was weighed today in the department is 155.6 kg.  She is provided with IV diuretics.  Plan is to admit to the hospital. I ordered medication including Lasix  Reevaluation of the patient after these medicines showed that the patient is stable  I have reviewed the patients home medicines and have made adjustments as needed   Consultations Obtained:  I requested consultation with the hospitalist, Dr. Shona,  and discussed lab and imaging findings as well as pertinent plan - they recommend: admission   Social Determinants of Health:  Has specialty care team   Test / Admission - Considered:  Admit      Final diagnoses:  Acute on chronic diastolic CHF (congestive heart failure) [facility])    ED Discharge Orders     None          Beverley Leita DELENA DEVONNA 01/02/24 2339    Griselda Norris, MD 01/03/24 0001

## 2024-01-03 DIAGNOSIS — Z8249 Family history of ischemic heart disease and other diseases of the circulatory system: Secondary | ICD-10-CM | POA: Diagnosis not present

## 2024-01-03 DIAGNOSIS — Z825 Family history of asthma and other chronic lower respiratory diseases: Secondary | ICD-10-CM | POA: Diagnosis not present

## 2024-01-03 DIAGNOSIS — Z1152 Encounter for screening for COVID-19: Secondary | ICD-10-CM | POA: Diagnosis not present

## 2024-01-03 DIAGNOSIS — I5033 Acute on chronic diastolic (congestive) heart failure: Secondary | ICD-10-CM | POA: Diagnosis present

## 2024-01-03 DIAGNOSIS — J9601 Acute respiratory failure with hypoxia: Secondary | ICD-10-CM | POA: Diagnosis present

## 2024-01-03 DIAGNOSIS — I509 Heart failure, unspecified: Secondary | ICD-10-CM | POA: Diagnosis present

## 2024-01-03 DIAGNOSIS — Z79899 Other long term (current) drug therapy: Secondary | ICD-10-CM | POA: Diagnosis not present

## 2024-01-03 DIAGNOSIS — Z86711 Personal history of pulmonary embolism: Secondary | ICD-10-CM | POA: Diagnosis not present

## 2024-01-03 DIAGNOSIS — I110 Hypertensive heart disease with heart failure: Secondary | ICD-10-CM | POA: Diagnosis present

## 2024-01-03 DIAGNOSIS — Z7984 Long term (current) use of oral hypoglycemic drugs: Secondary | ICD-10-CM | POA: Diagnosis not present

## 2024-01-03 DIAGNOSIS — E66813 Obesity, class 3: Secondary | ICD-10-CM | POA: Diagnosis present

## 2024-01-03 DIAGNOSIS — D509 Iron deficiency anemia, unspecified: Secondary | ICD-10-CM | POA: Diagnosis present

## 2024-01-03 DIAGNOSIS — I5043 Acute on chronic combined systolic (congestive) and diastolic (congestive) heart failure: Secondary | ICD-10-CM

## 2024-01-03 DIAGNOSIS — Z83438 Family history of other disorder of lipoprotein metabolism and other lipidemia: Secondary | ICD-10-CM | POA: Diagnosis not present

## 2024-01-03 DIAGNOSIS — Z7901 Long term (current) use of anticoagulants: Secondary | ICD-10-CM | POA: Diagnosis not present

## 2024-01-03 DIAGNOSIS — Z6841 Body Mass Index (BMI) 40.0 and over, adult: Secondary | ICD-10-CM | POA: Diagnosis not present

## 2024-01-03 DIAGNOSIS — G4733 Obstructive sleep apnea (adult) (pediatric): Secondary | ICD-10-CM | POA: Diagnosis present

## 2024-01-03 DIAGNOSIS — E871 Hypo-osmolality and hyponatremia: Secondary | ICD-10-CM | POA: Diagnosis present

## 2024-01-03 DIAGNOSIS — Z9102 Food additives allergy status: Secondary | ICD-10-CM | POA: Diagnosis not present

## 2024-01-03 DIAGNOSIS — I10 Essential (primary) hypertension: Secondary | ICD-10-CM | POA: Diagnosis not present

## 2024-01-03 DIAGNOSIS — Z820 Family history of epilepsy and other diseases of the nervous system: Secondary | ICD-10-CM | POA: Diagnosis not present

## 2024-01-03 DIAGNOSIS — E119 Type 2 diabetes mellitus without complications: Secondary | ICD-10-CM | POA: Diagnosis present

## 2024-01-03 LAB — BASIC METABOLIC PANEL WITH GFR
Anion gap: 10 (ref 5–15)
BUN: 7 mg/dL (ref 6–20)
CO2: 33 mmol/L — ABNORMAL HIGH (ref 22–32)
Calcium: 9.4 mg/dL (ref 8.9–10.3)
Chloride: 94 mmol/L — ABNORMAL LOW (ref 98–111)
Creatinine, Ser: 0.75 mg/dL (ref 0.44–1.00)
GFR, Estimated: >60 mL/min (ref >60)
Glucose, Bld: 92 mg/dL (ref 70–99)
Potassium: 4.2 mmol/L (ref 3.5–5.1)
Sodium: 137 mmol/L (ref 135–145)

## 2024-01-03 LAB — TSH: TSH: 1.331 u[IU]/mL (ref 0.350–4.500)

## 2024-01-03 LAB — GLUCOSE, CAPILLARY
Glucose-Capillary: 154 mg/dL — ABNORMAL HIGH (ref 70–99)
Glucose-Capillary: 76 mg/dL (ref 70–99)

## 2024-01-03 MED ORDER — EMPAGLIFLOZIN 10 MG PO TABS
10.0000 mg | ORAL_TABLET | Freq: Every day | ORAL | Status: DC
  Administered 2024-01-04 – 2024-01-05 (×2): 10 mg via ORAL
  Filled 2024-01-03 (×2): qty 1

## 2024-01-03 MED ORDER — INSULIN ASPART 100 UNIT/ML IJ SOLN
0.0000 [IU] | Freq: Three times a day (TID) | INTRAMUSCULAR | Status: DC
  Administered 2024-01-03: 3 [IU] via SUBCUTANEOUS
  Administered 2024-01-04: 2 [IU] via SUBCUTANEOUS
  Administered 2024-01-05: 3 [IU] via SUBCUTANEOUS

## 2024-01-03 MED ORDER — INSULIN ASPART 100 UNIT/ML IJ SOLN: 0.0000 [IU] | Freq: Every day | INTRAMUSCULAR | Status: DC

## 2024-01-03 MED ORDER — POTASSIUM CHLORIDE CRYS ER 20 MEQ PO TBCR
40.0000 meq | EXTENDED_RELEASE_TABLET | Freq: Two times a day (BID) | ORAL | Status: DC
  Administered 2024-01-03 – 2024-01-04 (×3): 40 meq via ORAL
  Filled 2024-01-03 (×3): qty 2

## 2024-01-03 MED ORDER — ONDANSETRON HCL 4 MG PO TABS: 4.0000 mg | ORAL_TABLET | Freq: Four times a day (QID) | ORAL | Status: DC | PRN

## 2024-01-03 MED ORDER — ACETAMINOPHEN 325 MG PO TABS
650.0000 mg | ORAL_TABLET | Freq: Four times a day (QID) | ORAL | Status: DC | PRN
  Administered 2024-01-03 – 2024-01-04 (×2): 650 mg via ORAL
  Filled 2024-01-03 (×2): qty 2

## 2024-01-03 MED ORDER — ACETAMINOPHEN 325 MG PO TABS
650.0000 mg | ORAL_TABLET | Freq: Once | ORAL | Status: AC
  Administered 2024-01-03: 650 mg via ORAL
  Filled 2024-01-03: qty 2

## 2024-01-03 MED ORDER — ACETAMINOPHEN 650 MG RE SUPP: 650.0000 mg | Freq: Four times a day (QID) | RECTAL | Status: DC | PRN

## 2024-01-03 MED ORDER — RIVAROXABAN 20 MG PO TABS
20.0000 mg | ORAL_TABLET | Freq: Every day | ORAL | Status: DC
  Administered 2024-01-03 – 2024-01-04 (×2): 20 mg via ORAL
  Filled 2024-01-03 (×2): qty 1

## 2024-01-03 MED ORDER — FUROSEMIDE 10 MG/ML IJ SOLN
80.0000 mg | Freq: Two times a day (BID) | INTRAMUSCULAR | Status: DC
  Administered 2024-01-03 – 2024-01-04 (×2): 80 mg via INTRAVENOUS
  Filled 2024-01-03 (×2): qty 8

## 2024-01-03 MED ORDER — ONDANSETRON HCL 4 MG/2ML IJ SOLN: 4.0000 mg | Freq: Four times a day (QID) | INTRAMUSCULAR | Status: DC | PRN

## 2024-01-03 NOTE — ED Notes (Signed)
 Patient states she takes her medications in the morning and that she has no night meds.

## 2024-01-03 NOTE — ED Notes (Signed)
 Patient taken off BiPAP at this time and placed on 3LNC. Patient stated she is feeling much better. Currently eating.

## 2024-01-03 NOTE — H&P (Signed)
 History and Physical    Patient: Christine Singleton FMW:[id number] DOB: [birth year] DOA: 01/02/2024 DOS: the patient was seen and examined on 01/03/2024 PCP: Celestia Rosaline SQUIBB, NP  Patient coming from: Home  Chief Complaint:  Chief Complaint  Patient presents with   Shortness of Breath   Chest Pain       HPI:  33 y.o. F with superobesity, HTN, HFimpEF, medication noncompliance, hx PE on Xarelto , DM and OSA on BiPAP who presented with CHF flare.   Patient was in [facility] since last admission for CHF, until about a week ago, noticed weight was up to high 340s (from dry weight 333lbs), she had orthopnea, leg swelling and feeling of heaviness in chest with exertion.  Seen in ER a few days ago, treated with IV Lasix  and released.   But it kept getting worse, so she came to the ER, where she had proBNP >1500, CXR with bilateral infiltrates, and new hypoxia.  Given Lasix  and transferred to [facility] for admission.     Review of Systems  Constitutional:  Negative for chills and fever.  Respiratory:  Positive for shortness of breath.   Cardiovascular:  Positive for orthopnea and leg swelling. Negative for chest pain.  All other systems reviewed and are negative.    Past Medical History:  Diagnosis Date   CHF (congestive heart failure) (HCC)    Gestational diabetes    With first pregnancy   History of cesarean delivery 04/23/2016   Hypertension    PE (pulmonary thromboembolism) (HCC)    Sleep apnea    Past Surgical History:  Procedure Laterality Date   CESAREAN SECTION     CESAREAN SECTION N/A 02/03/2016   Procedure: CESAREAN SECTION;  Surgeon: Burnard VEAR Pate, MD;  Location: [facility] BIRTHING SUIT[facility];  Service: Obstetrics;  Laterality: N/A;   Social History:  reports that she has never smoked. She has never used smokeless tobacco. She reports that she does not drink alcohol and does not use drugs.  Allergies  Allergen Reactions   Strawberry Extract Anaphylaxis    Family History   Problem Relation Age of Onset   Migraines Mother    Hypertension Mother    Hyperlipidemia Mother    Hypertension Father    Hyperlipidemia Father    Asthma Sister    Cancer Neg Hx    Diabetes Neg Hx     Prior to Admission medications   Medication Sig Start Date End Date Taking? Authorizing Provider  cetirizine (ZYRTEC) 10 MG tablet Take 10 mg by mouth daily as needed for allergies.    [provider]  empagliflozin  (JARDIANCE ) 10 MG TABS tablet Take 1 tablet (10 mg total) by mouth daily. 11/20/23   Arrien, Mauricio Daniel, MD  guaiFENesin  (MUCINEX ) 600 MG 12 hr tablet Take 600 mg by mouth 2 (two) times daily as needed for cough or to loosen phlegm.    [provider]  losartan  (COZAAR ) 25 MG tablet Take 1/2 tablet (12.5 mg total) by mouth daily. 12/14/23 03/13/24  Parthenia Olivia HERO, PA-C  metFORMIN  (GLUCOPHAGE ) 500 MG tablet Take 1 tablet (500 mg total) by mouth daily with breakfast. 11/20/23   Arrien, Elidia Sieving, MD  OXYGEN Inhale 3 L/min into the lungs at bedtime.    [provider]  PRESCRIPTION MEDICATION See admin instructions. CPAP- At bedtime    [provider]  rivaroxaban  (XARELTO ) 20 MG TABS tablet Take 1 tablet (20 mg total) by mouth daily. 10/21/23   Zenaida Morene PARAS, MD  spironolactone  (ALDACTONE )  25 MG tablet Take 1 tablet (25 mg total) by mouth daily. 11/20/23 12/20/23  Arrien, Mauricio Daniel, MD  torsemide  (DEMADEX ) 20 MG tablet Take 1 tablet (20 mg total) by mouth daily as needed (SWELLING). 12/14/23   Parthenia Olivia HERO, PA-C  TYLENOL  500 MG tablet Take 1 tablet (500 mg total) by mouth every 6 (six) hours as needed (for headaches). 11/20/23   Noralee Elidia Sieving, MD    Physical Exam: Vitals:   01/03/24 1200 01/03/24 1230 01/03/24 1237 01/03/24 1334  BP: 109/74 95/76 101/63 (!) 135/107  Pulse: 86 95 90 84  Resp: 15  18   Temp:   97.8 F (36.6 C) 98 F (36.7 C)  TempSrc:   Temporal Oral  SpO2: 99% 99% 98% 100%  Weight:       Height:       General appearance:   alert and sitting up in bed.  Responds appropriately to questions.  Eye contact, dress and hygiene appropriate. HEENT:  Anicteric, conjunctivae and sclerae normal without injection or icterus, lids and lashes normal.  Visual tracking smooth.  OP moist without lesions, dentition normal, lips normal, normal auditory acuity   Cor:  RRR, without murmurs, rubs.  JVP not visible due to body habitus, no pitting pedal volume status difficult to assess Resp: Breathing seems normal at rest, but with ambulation she is extremely short of breath, tachypneic, and appears labored.  Lung sounds are very diminished. MSK: Symmetrical without gross deformities of the hands, large joints, or legs. Skin:  cap refill normal, Skin intact without significant rashes or lesions. Neuro:  Speech is fluent.  Naming is grossly intact, patient's recall, both recent and remote, seem within normal limits.  Muscle tone normal, face symmetric  Psych:  Attention span and concentration are within normal limits.  Affect normal.  appropriate thought content and normal rate of speech, thought process linear           Data Reviewed: Basic metabolic panel shows normal electrolytes and renal function CBC shows mild leukocytosis, no anemia or thrombocytopenia proBNP elevated Chest x-ray, personally reviewed, shows bilateral infiltrates, small effusions EKG, personally reviewed, shows no ST changes Echocardiogram from 2 months ago shows preserved EF     Assessment and Plan: * Acute on chronic combined systolic and diastolic CHF (congestive heart failure) (HCC) EF previously 45%, improved to 60-65% on last Echo in Sep CXR here shows edema and she reports leg slewling and dyspnea with exertion and 20 lb weight gain and run out of medicines. Troponin normal  - Continue Furosemide  80 mg IV twice a day  - Supplement K PRN - Strict I/Os, daily weights, telemetry  - Daily monitoring renal  function    History of pulmonary embolism - Resume Xarelto   OSA (obstructive sleep apnea) - BiPAP at night  Essential hypertension BP soft - Hold losartan , spironolactone   Class 3 obesity (HCC) BMI >60, complicates care  Non-insulin  dependent type 2 diabetes mellitus (HCC) - Continue Jardiance  - Hold metformin  - Sliding scale correction insulin          Advance Care Planning: Full code  Consults:  none  Family Communication: None  Severity of Illness: The appropriate patient status for this patient is INPATIENT. Inpatient status is judged to be reasonable and necessary in order to provide the required intensity of service to ensure the patient's safety. The patient's presenting symptoms, physical exam findings, and initial radiographic and laboratory data in the context of their chronic comorbidities is felt to place  them at high risk for further clinical deterioration. Furthermore, it is not anticipated that the patient will be medically stable for discharge from the hospital within 2 midnights of admission.   * I certify that at the point of admission it is my clinical judgment that the patient will require inpatient hospital care spanning beyond 2 midnights from the point of admission due to high intensity of service, high risk for further deterioration and high frequency of surveillance required.*  Author: Lonni SHAUNNA Dalton, MD 01/03/2024 3:37 PM  For on call review [email or web address].

## 2024-01-03 NOTE — ED Notes (Signed)
 Pt awake and on the phone with family. Meal given.

## 2024-01-03 NOTE — Assessment & Plan Note (Signed)
 BiPAP at night

## 2024-01-03 NOTE — ED Notes (Addendum)
 Patient taken off BiPAP at this time. Stated she only wears O2 PRN at home so we attempted RA, but SAT dropped in the 80's. Placed on 2L

## 2024-01-03 NOTE — ED Notes (Signed)
 Report called to Ron, RT at [facility]. He will be assuming her care on 3E. Will send BiPAP mask over with [facility]

## 2024-01-03 NOTE — Assessment & Plan Note (Signed)
Continue  with rivaroxaban.  ?

## 2024-01-03 NOTE — ED Notes (Signed)
 Pt placed on nasal cannula on 2L. Pt stood at bedside for bed change.

## 2024-01-03 NOTE — Hospital Course (Signed)
 Christine Singleton was admitted to the hospital with the working diagnosis of heart failure exacerbation   33 y.o. female with obesity class 3, HTN, heart failure, hx PE T2DM and OSA who presented with dyspnea.  10/16 ED evaluation for volume overload, she was discharged home with instructions to continue taking oral furosemide .  Patient reported rapid weight gain, 10 lbs, along with dyspnea on exertion, lower extremity edema, and orthopnea.  Apparently she has not been adherent to a fluid restriction diet.  On his initial physical examination his blood pressure was 109/74, HR 86, RR 15 and 02 saturation 98% Lungs with decreased breath sounds and increased work of breathing, heart with S1 and S2 present and regular with no gallops, rubs or murmurs, abdomen with no distention, soft and non tender, positive lower extremity edema.   Na 136, K 4.4 CL 97 bicarbonate 29 glucose 98 bun 9 cr 0.73  BNP 1,588  High sensitive troponin <15  Wbc 15.3 hgb 12.9 plt 405  Toxicology screen positive for tetrahydrocannabinol Sars covid 19 negative Influenza negative RSV negative    Chest radiograph with cardiomegaly, bilateral hilar vascular congestion, diffuse bilateral central interstitial infiltrates.  EKG 107 bpm, normal axis, normal intervals, qtc 454, sinus rhythm with no significant ST segment changes, negative T wave lead II, III, aVF, V3 and V4.   Improved volume status, plan to follow up as outpatient.

## 2024-01-03 NOTE — Assessment & Plan Note (Signed)
 Calculated BMI is 60.8

## 2024-01-03 NOTE — ED Notes (Signed)
 Patient was dozing off and SAT was dropping. Placed back on BiPAP

## 2024-01-03 NOTE — Assessment & Plan Note (Signed)
 Resume blood pressure control with losartan  and spironolactone 

## 2024-01-03 NOTE — Assessment & Plan Note (Signed)
 Echocardiogram with preserved LV systolic function with EF 60 to 65%, mild LVH, fusion of EA, RV systolic function with moderate reduction, no significant valvular disease, LA and RA with normal size.  Urine output is 3.150 ml Systolic blood pressure 100 mmHg range  Plan to continue diuresis with furosemide , she had one IV dose this morning. Will transition to po in am.  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  Continue with SGLT 2 inh

## 2024-01-03 NOTE — Assessment & Plan Note (Signed)
 Continue glucose cover and monitoring with insulin  sliding scale Patient is tolerating po well.

## 2024-01-04 ENCOUNTER — Telehealth: Payer: Self-pay | Admitting: Primary Care

## 2024-01-04 DIAGNOSIS — Z86711 Personal history of pulmonary embolism: Secondary | ICD-10-CM

## 2024-01-04 DIAGNOSIS — I5033 Acute on chronic diastolic (congestive) heart failure: Secondary | ICD-10-CM

## 2024-01-04 DIAGNOSIS — D509 Iron deficiency anemia, unspecified: Secondary | ICD-10-CM | POA: Diagnosis not present

## 2024-01-04 DIAGNOSIS — I10 Essential (primary) hypertension: Secondary | ICD-10-CM

## 2024-01-04 DIAGNOSIS — G4733 Obstructive sleep apnea (adult) (pediatric): Secondary | ICD-10-CM

## 2024-01-04 DIAGNOSIS — E119 Type 2 diabetes mellitus without complications: Secondary | ICD-10-CM

## 2024-01-04 DIAGNOSIS — E66813 Obesity, class 3: Secondary | ICD-10-CM

## 2024-01-04 LAB — CBC
HCT: 47.9 % — ABNORMAL HIGH (ref 36.0–46.0)
Hemoglobin: 15.3 g/dL — ABNORMAL HIGH (ref 12.0–15.0)
MCH: 23 pg — ABNORMAL LOW (ref 26.0–34.0)
MCHC: 31.9 g/dL (ref 30.0–36.0)
MCV: 72.1 fL — ABNORMAL LOW (ref 80.0–100.0)
Platelets: 471 K/uL — ABNORMAL HIGH (ref 150–400)
RBC: 6.64 MIL/uL — ABNORMAL HIGH (ref 3.87–5.11)
RDW: 19.3 % — ABNORMAL HIGH (ref 11.5–15.5)
WBC: 14.7 K/uL — ABNORMAL HIGH (ref 4.0–10.5)
nRBC: 0 % (ref 0.0–0.2)

## 2024-01-04 LAB — GLUCOSE, CAPILLARY
Glucose-Capillary: 129 mg/dL — ABNORMAL HIGH (ref 70–99)
Glucose-Capillary: 122 mg/dL — ABNORMAL HIGH (ref 70–99)
Glucose-Capillary: 108 mg/dL — ABNORMAL HIGH (ref 70–99)

## 2024-01-04 LAB — BASIC METABOLIC PANEL WITH GFR
Anion gap: 11 (ref 5–15)
BUN: 9 mg/dL (ref 6–20)
CO2: 32 mmol/L (ref 22–32)
Calcium: 9.2 mg/dL (ref 8.9–10.3)
Chloride: 94 mmol/L — ABNORMAL LOW (ref 98–111)
Creatinine, Ser: 0.68 mg/dL (ref 0.44–1.00)
GFR, Estimated: >60 mL/min (ref >60)
Glucose, Bld: 88 mg/dL (ref 70–99)
Potassium: 4.3 mmol/L (ref 3.5–5.1)
Sodium: 137 mmol/L (ref 135–145)

## 2024-01-04 MED ORDER — LOSARTAN POTASSIUM 25 MG PO TABS
25.0000 mg | ORAL_TABLET | Freq: Every day | ORAL | Status: DC
  Administered 2024-01-04: 25 mg via ORAL
  Filled 2024-01-04: qty 1

## 2024-01-04 MED ORDER — SPIRONOLACTONE 25 MG PO TABS
25.0000 mg | ORAL_TABLET | Freq: Every day | ORAL | Status: DC
  Administered 2024-01-04 – 2024-01-05 (×2): 25 mg via ORAL
  Filled 2024-01-04 (×2): qty 1

## 2024-01-04 NOTE — TOC CM/SW Note (Addendum)
 Transition of Care [facility]) - Inpatient Brief Assessment   Patient Details  Name: Christine Singleton MRN: [id number] Date of Birth: 12-01-1990  Transition of Care [facility]) CM/SW Contact:    Christine Barnie Rama, RN Phone Number: 01/04/2024, 10:12 AM   Clinical Narrative: From home , grandma and mom lives with her, has PCP  Christine Singleton and insurance on file, states has no HH services in place at this time, has home oxygen 3 liters with [employer] and cpap at night at home.  States family member  (grandma) will transport them home at [employer] and family is support system, states gets medications from [facility].   Pta self ambulatory.   Patient asked this NCM to see if we could schedule her with another doctor. Patient has new patient apt at the [facility] on [address].     Transition of Care Asessment: Insurance and Status: Insurance coverage has been reviewed Patient has primary care physician: Yes Home environment has been reviewed: home  alone, mom and grandma lives with her Prior level of function:: indep Prior/Current Home Services: No current home services Social Drivers of Health Review: SDOH reviewed no interventions necessary Readmission risk has been reviewed: Yes Transition of care needs: no transition of care needs at this time

## 2024-01-04 NOTE — Progress Notes (Signed)
 Pt in no distress requiring bipap at this time.

## 2024-01-04 NOTE — Progress Notes (Signed)
 Heart Failure Navigator Progress Note  Assessed for Heart & Vascular TOC clinic readiness.  Patient does not meet criteria due to she is a Advanced Heart Failure Team patient of Dr. Zenaida. .   Navigator will sign off at this time.   Stephane Haddock, BSN, Scientist, clinical (histocompatibility and immunogenetics) Only

## 2024-01-04 NOTE — Assessment & Plan Note (Signed)
 Cell count has been stable.

## 2024-01-04 NOTE — Progress Notes (Signed)
 Progress Note   Patient: Christine Singleton FMW:[id number] DOB: 01-04-1991 DOA: 01/02/2024     1 DOS: the patient was seen and examined on 01/04/2024   Brief hospital course: [name] was admitted to the hospital with the working diagnosis of heart failure exacerbation   33 y.o. female with obesity class 3, HTN, heart failure, hx PE T2DM and OSA who presented with dyspnea.  10/16 ED evaluation for volume overload, she was discharged home with instructions to continue taking oral furosemide .  Patient reported rapid weight gain, 10 lbs, along with dyspnea on exertion, lower extremity edema, and orthopnea.  Apparently she has not been adherent to a fluid restriction diet.  On his initial physical examination his blood pressure was 109/74, HR 86, RR 15 and 02 saturation 98% Lungs with decreased breath sounds and increased work of breathing, heart with S1 and S2 present and regular with no gallops, rubs or murmurs, abdomen with no distention, soft and non tender, positive lower extremity edema.   Na 136, K 4.4 CL 97 bicarbonate 29 glucose 98 bun 9 cr 0.73  BNP 1,588  High sensitive troponin <15  Wbc 15.3 hgb 12.9 plt 405  Toxicology screen positive for tetrahydrocannabinol Sars covid 19 negative Influenza negative RSV negative    Chest radiograph with cardiomegaly, bilateral hilar vascular congestion, diffuse bilateral central interstitial infiltrates.  EKG 107 bpm, normal axis, normal intervals, qtc 454, sinus rhythm with no significant ST segment changes, negative T wave lead II, III, aVF, V3 and V4.   Assessment and Plan: * Acute on chronic diastolic CHF (congestive heart failure) (HCC) Echocardiogram with preserved LV systolic function with EF 60 to 65%, mild LVH, fusion of EA, RV systolic function with moderate reduction, no significant valvular disease, LA and RA with normal size.  Urine output is 3.150 ml Systolic blood pressure 100 mmHg range  Plan to continue diuresis with  furosemide , she had one IV dose this morning. Will transition to po in am.  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  Continue with SGLT 2 inh    Essential hypertension Resume blood pressure control with losartan  and spironolactone    History of pulmonary embolism Continue with rivaroxaban   Iron deficiency anemia Cell count has been stable   Non-insulin  dependent type 2 diabetes mellitus (HCC) Continue glucose cover and monitoring with insulin  sliding scale Patient is tolerating po well.   OSA (obstructive sleep apnea) - BiPAP at night  Class 3 obesity (HCC) Calculated BMI is 60.8      Subjective: Patient with improvement in dyspnea and edema, no chest pain or palpitations   Physical Exam: Vitals:   01/04/24 0007 01/04/24 0404 01/04/24 0823 01/04/24 1141  BP:  112/85 (!) 127/90 110/69  Pulse:  93 100 89  Resp: 20 20 20 20   Temp: 98.4 F (36.9 C) 97.8 F (36.6 C) 97.9 F (36.6 C) 98.1 F (36.7 C)  TempSrc: Oral Oral Oral Oral  SpO2:  91% 95% 94%  Weight:  (!) 152.1 kg  (!) 150.9 kg  Height:       Neurology awake and alert ENT with mild pallor Cardiovascular with S1 and S2 present and regular with no gallops, rubs or murmurs Respiratory with bilateral rales at bases with no wheezing Abdomen protuberant, not tender and not distended Trace lower extremity edema   Data Reviewed:    Family Communication: no family at the bedside   Disposition: Status is: Inpatient Remains inpatient appropriate because: recovering heart failure   Planned Discharge Destination:  Home     Author: Elidia Toribio Furnace, MD 01/04/2024 4:25 PM  For on call review [email or web address].

## 2024-01-04 NOTE — Plan of Care (Signed)
  Problem: Clinical Measurements: Goal: Respiratory complications will improve Outcome: Progressing   Problem: Activity: Goal: Risk for activity intolerance will decrease Outcome: Progressing   Problem: Safety: Goal: Ability to remain free from injury will improve Outcome: Progressing   Problem: Activity: Goal: Capacity to carry out activities will improve Outcome: Progressing   Problem: Coping: Goal: Ability to adjust to condition or change in health will improve Outcome: Progressing

## 2024-01-04 NOTE — Telephone Encounter (Signed)
 Copied from CRM #[id number]. Topic: Appointments - Scheduling Inquiry for Clinic >> Jan 04, 2024 10:25 AM Everette C wrote: Reason for CRM: Barnie Nurse Case manager with [facility] would like to speak with a member of administrative staff about scheduling a hospital follow up appointment for the patient who will be potentially discharged 01/05/24  Please contact Deborah at [phone]

## 2024-01-05 ENCOUNTER — Ambulatory Visit: Admitting: Physician Assistant

## 2024-01-05 ENCOUNTER — Other Ambulatory Visit (HOSPITAL_COMMUNITY): Payer: Self-pay

## 2024-01-05 DIAGNOSIS — I5033 Acute on chronic diastolic (congestive) heart failure: Secondary | ICD-10-CM | POA: Diagnosis not present

## 2024-01-05 DIAGNOSIS — E871 Hypo-osmolality and hyponatremia: Secondary | ICD-10-CM | POA: Diagnosis not present

## 2024-01-05 DIAGNOSIS — Z86711 Personal history of pulmonary embolism: Secondary | ICD-10-CM | POA: Diagnosis not present

## 2024-01-05 DIAGNOSIS — I10 Essential (primary) hypertension: Secondary | ICD-10-CM | POA: Diagnosis not present

## 2024-01-05 LAB — BASIC METABOLIC PANEL WITH GFR
Anion gap: 12 (ref 5–15)
BUN: 10 mg/dL (ref 6–20)
CO2: 29 mmol/L (ref 22–32)
Calcium: 9.1 mg/dL (ref 8.9–10.3)
Chloride: 93 mmol/L — ABNORMAL LOW (ref 98–111)
Creatinine, Ser: 0.74 mg/dL (ref 0.44–1.00)
GFR, Estimated: >60 mL/min (ref >60)
Glucose, Bld: 94 mg/dL (ref 70–99)
Potassium: 4.1 mmol/L (ref 3.5–5.1)
Sodium: 134 mmol/L — ABNORMAL LOW (ref 135–145)

## 2024-01-05 LAB — GLUCOSE, CAPILLARY
Glucose-Capillary: 108 mg/dL — ABNORMAL HIGH (ref 70–99)
Glucose-Capillary: 113 mg/dL — ABNORMAL HIGH (ref 70–99)
Glucose-Capillary: 151 mg/dL — ABNORMAL HIGH (ref 70–99)

## 2024-01-05 LAB — MAGNESIUM: Magnesium: 2 mg/dL (ref 1.7–2.4)

## 2024-01-05 MED ORDER — METFORMIN HCL 500 MG PO TABS
500.0000 mg | ORAL_TABLET | Freq: Every day | ORAL | 0 refills | Status: AC
  Filled 2024-01-05: qty 30, 30d supply, fill #0

## 2024-01-05 MED ORDER — SPIRONOLACTONE 25 MG PO TABS
25.0000 mg | ORAL_TABLET | Freq: Every day | ORAL | 0 refills | Status: AC
  Filled 2024-01-05: qty 30, 30d supply, fill #0

## 2024-01-05 MED ORDER — EMPAGLIFLOZIN 10 MG PO TABS
10.0000 mg | ORAL_TABLET | Freq: Every day | ORAL | 0 refills | Status: AC
  Filled 2024-01-05: qty 30, 30d supply, fill #0

## 2024-01-05 MED ORDER — LOSARTAN POTASSIUM 25 MG PO TABS
12.5000 mg | ORAL_TABLET | Freq: Every day | ORAL | 0 refills | Status: DC
  Filled 2024-01-05: qty 30, 60d supply, fill #0

## 2024-01-05 MED ORDER — TORSEMIDE 20 MG PO TABS
20.0000 mg | ORAL_TABLET | Freq: Every day | ORAL | 0 refills | Status: AC
  Filled 2024-01-05 – 2024-04-22 (×2): qty 30, 30d supply, fill #0

## 2024-01-05 NOTE — TOC Transition Note (Signed)
 Transition of Care [facility]) - Discharge Note   Patient Details  Name: Christine Singleton MRN: [id number] Date of Birth: Apr 08, 1990  Transition of Care [facility]) CM/SW Contact:  Waddell Barnie Rama, RN Phone Number: 01/05/2024, 12:12 PM   Clinical Narrative:    For dc today, she has transport at dc.  She already has home oxygen.         Patient Goals and CMS Choice            Discharge Placement                       Discharge Plan and Services Additional resources added to the After Visit Summary for                                       Social Drivers of Health (SDOH) Interventions SDOH Screenings   Food Insecurity: Food Insecurity Present (01/03/2024)  Housing: High Risk (01/03/2024)  Transportation Needs: No Transportation Needs (01/03/2024)  Utilities: At Risk (01/03/2024)  Alcohol Screen: Low Risk  (08/18/2022)  Depression (PHQ2-9): Low Risk  (11/23/2023)  Financial Resource Strain: High Risk (09/08/2022)  Social Connections: Unknown (01/03/2024)  Tobacco Use: Low Risk  (01/02/2024)     Readmission Risk Interventions    01/04/2024   10:11 AM 11/20/2023   12:48 PM  Readmission Risk Prevention Plan  Transportation Screening Complete Complete  PCP or Specialist Appt within 5-7 Days  Complete  Home Care Screening Complete Complete  Medication Review (RN CM) Complete Complete

## 2024-01-05 NOTE — Discharge Summary (Signed)
 Physician Discharge Summary   Patient: Christine Singleton MRN: [id number] DOB: 14-Dec-1990  Admit date:     01/02/2024  Discharge date: 01/05/24  Discharge Physician: Elidia Sieving Karry Barrilleaux   PCP: Celestia Rosaline SQUIBB, NP   Recommendations at discharge:    Continue guideline directed medical therapy with spironolactone , losartan  and SGLT 2 inh Changed torsemide  to 20 mg po daily Advised to stay adherent to her medications and fluid restrictions Follow up renal function and electrolytes in 7 days as outpatient Follow up with Rosaline Celestia NP in 7 to 10 days Follow up with Cardiology as scheduled.   Discharge Diagnoses: Principal Problem:   Acute on chronic diastolic CHF (congestive heart failure) (HCC) Active Problems:   Essential hypertension   History of pulmonary embolism   Iron deficiency anemia   Non-insulin  dependent type 2 diabetes mellitus (HCC)   OSA (obstructive sleep apnea)   Class 3 obesity (HCC)  Resolved Problems:   * No resolved hospital problems. [facility] Course: Christine Singleton was admitted to the hospital with the working diagnosis of heart failure exacerbation   33 y.o. female with obesity class 3, HTN, heart failure, hx PE T2DM and OSA who presented with dyspnea.  10/16 ED evaluation for volume overload, she was discharged home with instructions to continue taking oral furosemide .  Patient reported rapid weight gain, 10 lbs, along with dyspnea on exertion, lower extremity edema, and orthopnea.  Apparently she has not been adherent to a fluid restriction diet.  On his initial physical examination his blood pressure was 109/74, HR 86, RR 15 and 02 saturation 98% Lungs with decreased breath sounds and increased work of breathing, heart with S1 and S2 present and regular with no gallops, rubs or murmurs, abdomen with no distention, soft and non tender, positive lower extremity edema.   Na 136, K 4.4 CL 97 bicarbonate 29 glucose 98 bun 9 cr 0.73  BNP 1,588   High sensitive troponin <15  Wbc 15.3 hgb 12.9 plt 405  Toxicology screen positive for tetrahydrocannabinol Sars covid 19 negative Influenza negative RSV negative    Chest radiograph with cardiomegaly, bilateral hilar vascular congestion, diffuse bilateral central interstitial infiltrates.  EKG 107 bpm, normal axis, normal intervals, qtc 454, sinus rhythm with no significant ST segment changes, negative T wave lead II, III, aVF, V3 and V4.   Improved volume status, plan to follow up as outpatient.   Assessment and Plan: * Acute on chronic diastolic CHF (congestive heart failure) (HCC) Echocardiogram with preserved LV systolic function with EF 60 to 65%, mild LVH, fusion of EA, RV systolic function with moderate reduction, no significant valvular disease, LA and RA with normal size.  Patient was placed on IV furosemide  for diuresis, negative fluid balance was achieved, - 6,560 ml with significant improvement in her symptoms.   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  Continue with SGLT 2 inh  Continue daily furosemide .  Advised to stay adherent to fluid restriction and her heart medications,    Essential hypertension Resume blood pressure control with losartan  and spironolactone    History of pulmonary embolism Continue with rivaroxaban   Hyponatremia At the time of discharge her renal function stable with serum cr at 0,74, K is 4.1 and serum bicarbonate at 29  Na 134   Continue diuresis with torsemide , and spironolactone  and SGLT 2 inh   Iron deficiency anemia Cell count has been stable   Non-insulin  dependent type 2 diabetes mellitus (HCC) Continue glucose cover and monitoring with insulin  sliding  scale Patient is tolerating po well.   OSA (obstructive sleep apnea) - BiPAP at night  Class 3 obesity (HCC) Calculated BMI is 60.8         Consultants: none  Procedures performed: none   Disposition: Home Diet recommendation:  Cardiac  diet DISCHARGE MEDICATION: Allergies as of 01/05/2024       Reactions   Strawberry Extract Anaphylaxis        Medication List     STOP taking these medications    cetirizine 10 MG tablet Commonly known as: ZYRTEC   furosemide  40 MG tablet Commonly known as: LASIX    guaiFENesin  600 MG 12 hr tablet Commonly known as: MUCINEX    PRESCRIPTION MEDICATION       TAKE these medications    empagliflozin  10 MG Tabs tablet Commonly known as: JARDIANCE  Take 1 tablet (10 mg total) by mouth daily.   losartan  25 MG tablet Commonly known as: COZAAR  Take 0.5 tablets (12.5 mg total) by mouth daily. Start taking on: January 06, 2024   metFORMIN  500 MG tablet Commonly known as: GLUCOPHAGE  Take 1 tablet (500 mg total) by mouth daily with breakfast.   OXYGEN Inhale 3 L/min into the lungs at bedtime.   spironolactone  25 MG tablet Commonly known as: ALDACTONE  Take 1 tablet (25 mg total) by mouth daily.   torsemide  20 MG tablet Commonly known as: DEMADEX  Take 1 tablet (20 mg total) by mouth daily. What changed:  when to take this reasons to take this   TYLENOL  500 MG tablet Generic drug: acetaminophen  Take 1 tablet (500 mg total) by mouth every 6 (six) hours as needed (for headaches).   Xarelto  20 MG Tabs tablet Generic drug: rivaroxaban  Take 1 tablet (20 mg total) by mouth daily.        Follow-up Information     Almarie Waddell NOVAK, NP Follow up on 04/05/2024.   Specialties: Family Medicine, Emergency Medicine Why: 11:20 for new patient establishement Contact information: [address] [place] [address] [phone]         Celestia Rosaline SQUIBB, NP Follow up.   Specialty: Internal Medicine Why: office will call back to schedule the hospital follow up Contact information: [address] [place] [address] [phone]                Discharge Exam: Filed Weights   01/04/24 0404 01/04/24 1141 01/05/24 0446  Weight: (!)  152.1 kg (!) 150.9 kg (!) 152 kg   BP 112/67   Pulse 92   Temp (!) 97.5 F (36.4 C) (Oral)   Resp 18   Ht 5' 2 (1.575 m)   Wt (!) 152 kg   LMP  (Within Months)   SpO2 94%   BMI 61.29 kg/m   Neurology awake and alert ENT with mild pallor Cardiovascular with S1 and S2 present, and regular with no gallops or rubs Respiratory with no rales, no wheezing Abdomen with no distention  No lower extremity  Condition at discharge: stable  The results of significant diagnostics from this hospitalization (including imaging, microbiology, ancillary and laboratory) are listed below for reference.   Imaging Studies: DG Chest Port 1 View Result Date: 01/02/2024 EXAM: 1 VIEW(S) XRAY OF THE CHEST 01/02/2024 09:31:00 PM COMPARISON: 12/31/2023 CLINICAL HISTORY: SHOB. Pt reports worsening shortness of breath and CP since being seen on 10/16. Reports decreased activity tolerance and DOE FINDINGS: LUNGS AND PLEURA: Peribronchial cuffing with diffuse interstitial prominence. Linear opacity in right mid lung. No  pulmonary edema. No pleural effusion. No pneumothorax. HEART AND MEDIASTINUM: Cardiomegaly. BONES AND SOFT TISSUES: No acute osseous abnormality. IMPRESSION: 1. Findings favor CHF and pulmonary edema. Diffuse infection could appear similarly. Electronically signed by: Norman Gatlin MD 01/02/2024 09:40 PM EDT RP Workstation: [id number]   DG Chest 2 View Result Date: 12/31/2023 CLINICAL DATA:  Shortness of breath with bilateral hand and feet numbness and tingling. EXAM: CHEST - 2 VIEW COMPARISON:  November 18, 2023 FINDINGS: The heart size and mediastinal contours are within normal limits. Low lung volumes are noted with subsequent crowding of the bronchovascular lung markings. Breast attenuation artifact is seen bilaterally. Mild prominence of the central pulmonary vasculature is noted. No acute infiltrate, pleural effusion or pneumothorax is identified. The visualized skeletal structures are  unremarkable. IMPRESSION: Low lung volumes with mild central pulmonary vascular congestion. Electronically Signed   By: Suzen Dials M.D.   On: 12/31/2023 17:22    Microbiology: Results for orders placed or performed during the hospital encounter of 01/02/24  Resp panel by RT-PCR (RSV, Flu A&B, Covid) Anterior Nasal Swab     Status: None   Collection Time: 01/02/24  9:23 PM   Specimen: Anterior Nasal Swab  Result Value Ref Range Status   SARS Coronavirus 2 by RT PCR NEGATIVE NEGATIVE Final    Comment: (NOTE) SARS-CoV-2 target nucleic acids are NOT DETECTED.  The SARS-CoV-2 RNA is generally detectable in upper respiratory specimens during the acute phase of infection. The lowest concentration of SARS-CoV-2 viral copies this assay can detect is 138 copies/mL. A negative result does not preclude SARS-Cov-2 infection and should not be used as the sole basis for treatment or other patient management decisions. A negative result may occur with  improper specimen collection/handling, submission of specimen other than nasopharyngeal swab, presence of viral mutation(s) within the areas targeted by this assay, and inadequate number of viral copies(<138 copies/mL). A negative result must be combined with clinical observations, patient history, and epidemiological information. The expected result is Negative.  Fact Sheet for Patients:  BloggerCourse.com  Fact Sheet for Healthcare Providers:  SeriousBroker.it  This test is no t yet approved or cleared by the [place]  FDA and  has been authorized for detection and/or diagnosis of SARS-CoV-2 by FDA under an Emergency Use Authorization (EUA). This EUA will remain  in effect (meaning this test can be used) for the duration of the COVID-19 declaration under Section 564(b)(1) of the Act, 21 U.S.C.section 360bbb-3(b)(1), unless the authorization is terminated  or revoked sooner.        Influenza A by PCR NEGATIVE NEGATIVE Final   Influenza B by PCR NEGATIVE NEGATIVE Final    Comment: (NOTE) The Xpert Xpress SARS-CoV-2/FLU/RSV plus assay is intended as an aid in the diagnosis of influenza from Nasopharyngeal swab specimens and should not be used as a sole basis for treatment. Nasal washings and aspirates are unacceptable for Xpert Xpress SARS-CoV-2/FLU/RSV testing.  Fact Sheet for Patients: BloggerCourse.com  Fact Sheet for Healthcare Providers: SeriousBroker.it  This test is not yet approved or cleared by the [place]  FDA and has been authorized for detection and/or diagnosis of SARS-CoV-2 by FDA under an Emergency Use Authorization (EUA). This EUA will remain in effect (meaning this test can be used) for the duration of the COVID-19 declaration under Section 564(b)(1) of the Act, 21 U.S.C. section 360bbb-3(b)(1), unless the authorization is terminated or revoked.     Resp Syncytial Virus by PCR NEGATIVE NEGATIVE Final    Comment: (NOTE) Fact Sheet  for Patients: BloggerCourse.com  Fact Sheet for Healthcare Providers: SeriousBroker.it  This test is not yet approved or cleared by the [place]  FDA and has been authorized for detection and/or diagnosis of SARS-CoV-2 by FDA under an Emergency Use Authorization (EUA). This EUA will remain in effect (meaning this test can be used) for the duration of the COVID-19 declaration under Section 564(b)(1) of the Act, 21 U.S.C. section 360bbb-3(b)(1), unless the authorization is terminated or revoked.  Performed at [facility], [address] [place], [place] [address]     Labs: CBC: Recent Labs  Lab 12/31/23 1627 01/02/24 2152 01/04/24 0221  WBC 14.7* 15.3* 14.7*  NEUTROABS 9.4* 10.2*  --   HGB 13.7 12.9 15.3*  HCT 43.6 39.9 47.9*  MCV 73.5* 71.9* 72.1*  PLT 375 405* 471*   Basic  Metabolic Panel: Recent Labs  Lab 12/31/23 1627 01/02/24 2152 01/03/24 1453 01/04/24 0221 01/05/24 0229  NA 135 136 137 137 134*  K 4.8 4.4 4.2 4.3 4.1  CL 97* 97* 94* 94* 93*  CO2 27 29 33* 32 29  GLUCOSE 74 98 92 88 94  BUN 8 9 7 9 10   CREATININE 0.71 0.73 0.75 0.68 0.74  CALCIUM 8.7* 9.1 9.4 9.2 9.1  MG  --   --   --   --  2.0   Liver Function Tests: Recent Labs  Lab 12/31/23 1627  AST 24  ALT 15  ALKPHOS 121  BILITOT 0.7  PROT 7.0  ALBUMIN 3.1*   CBG: Recent Labs  Lab 01/04/24 1139 01/04/24 1642 01/05/24 0012 01/05/24 0819 01/05/24 1101  GLUCAP 122* 108* 108* 113* 151*    Discharge time spent: greater than 30 minutes.  Signed: Elidia Toribio Furnace, MD [facility] 01/05/2024

## 2024-01-05 NOTE — Assessment & Plan Note (Addendum)
 At the time of discharge her renal function stable with serum cr at 0,74, K is 4.1 and serum bicarbonate at 29  Na 134   Continue diuresis with torsemide , and spironolactone  and SGLT 2 inh

## 2024-01-06 ENCOUNTER — Telehealth: Payer: Self-pay | Admitting: *Deleted

## 2024-01-06 NOTE — Transitions of Care (Post Inpatient/ED Visit) (Signed)
   01/06/2024  Name: Christine Singleton MRN: [id number] DOB: [birth year]  Today's TOC FU Call Status: Today's TOC FU Call Status:: Unsuccessful Call (1st Attempt) Unsuccessful Call (1st Attempt) Date: 01/06/24  Attempted to reach the patient regarding the most recent Inpatient/ED visit.  Follow Up Plan: Additional outreach attempts will be made to reach the patient to complete the Transitions of Care (Post Inpatient/ED visit) call.   Andrea Dimes RN, BSN [place]  [facility] RN Care Manager [phone]

## 2024-01-07 ENCOUNTER — Telehealth: Payer: Self-pay | Admitting: *Deleted

## 2024-01-07 NOTE — Transitions of Care (Post Inpatient/ED Visit) (Signed)
   01/07/2024  Name: Mckaylie Vasey MRN: [id number] DOB: [birth year]  Today's TOC FU Call Status: Today's TOC FU Call Status:: Unsuccessful Call (2nd Attempt) Unsuccessful Call (2nd Attempt) Date: 01/07/24  Attempted to reach the patient regarding the most recent Inpatient/ED visit.  Follow Up Plan: Additional outreach attempts will be made to reach the patient to complete the Transitions of Care (Post Inpatient/ED visit) call.   Andrea Dimes RN, BSN [place]  [facility] RN Care Manager [phone]

## 2024-01-08 ENCOUNTER — Telehealth: Payer: Self-pay

## 2024-01-08 NOTE — Transitions of Care (Post Inpatient/ED Visit) (Signed)
   01/08/2024  Name: Christine Singleton MRN: [id number] DOB: 21-Aug-1990  Today's TOC FU Call Status: Today's TOC FU Call Status:: Successful TOC FU Call Completed TOC FU Call Complete Date: 01/08/24 Patient's Name and Date of Birth confirmed.  Transition Care Management Follow-up Telephone Call How have you been since you were released from the hospital?: Better Any questions or concerns?: No  Items Reviewed: Did you receive and understand the discharge instructions provided?: Yes  Medications Reviewed Today: declined review- verbally states that she has all her medications Medications Reviewed Today   Medications were not reviewed in this encounter      Placed call to patient who reports that she doing well. Explained reason for call. States that she has her follow ups schedule and her medications.  Offered to review all instructions and she declined.  Reports that  this is not the first time I have been in the hospital for this   Provided my contact information for patient to call back if needed- she voiced understanding.  Alan Ee, RN, BSN, CEN [employer]- Transition of Care Team.  [facility] [phone]

## 2024-01-11 ENCOUNTER — Institutional Professional Consult (permissible substitution): Admitting: Nurse Practitioner

## 2024-01-13 ENCOUNTER — Inpatient Hospital Stay (HOSPITAL_COMMUNITY): Admitting: Cardiology

## 2024-01-25 ENCOUNTER — Ambulatory Visit (HOSPITAL_COMMUNITY)

## 2024-01-25 ENCOUNTER — Institutional Professional Consult (permissible substitution): Admitting: Nurse Practitioner

## 2024-01-25 NOTE — Progress Notes (Incomplete)
 Advanced Heart Failure Clinic Note   Referring Physician: PCP: Celestia Rosaline SQUIBB, NP PCP-Cardiologist: Alvan Ronal BRAVO, MD (Inactive)  AHF: Dr. Zenaida   Chief Complaint:  [facility] F/u for Heart Failure   HPI:  33 y.o. female with a PMH of morbid obesity, HFmrEF, OSA  and h/o PE in 2024. Echo 11/24 at time of PE EF 45-50%, RV mildly reduced. Most recent echo 9/25 EF improved 60-65%, RV moderately reduced.   She was recently seen by Dr. Zenaida 8/25 and felt to be stable from HF standpoint and not felt to need any further advanced heart failure clinic f/u. She was referred back to gen cards w/ f/u in the [facility] on a PRN basis.   She presents today after being referred back for post hospital f/u for a/c CHF. Apparently she has not been adherent to a fluid restriction diet. She was admitted by internal medicine. Placed on IV furosemide  for diuresis, negative fluid balance was achieved, - 6,560 ml with significant improvement in her symptoms. GDMT continued.   Today in f/u, she reports     Echo 12/10/23  1. Left ventricular ejection fraction, by estimation, is 60 to 65%. The  left ventricle has normal function. The left ventricle has no regional  wall motion abnormalities. There is mild concentric left ventricular  hypertrophy. Indeterminate diastolic  filling due to E-A fusion.   2. Right ventricular systolic function is moderately reduced. The right  ventricular size is moderately enlarged. Tricuspid regurgitation signal is  inadequate for assessing PA pressure.   3. The mitral valve is normal in structure. No evidence of mitral valve  regurgitation. No evidence of mitral stenosis.   4. The aortic valve is tricuspid. There is mild calcification of the  aortic valve. Aortic valve regurgitation is not visualized. Aortic valve  sclerosis/calcification is present, without any evidence of aortic  stenosis.   5. The inferior vena cava is normal in size with greater than 50%   respiratory variability, suggesting right atrial pressure of 3 mmHg.       Review of Systems: [y] = yes, [ ]  = no   General: Weight gain [ ] ; Weight loss [ ] ; Anorexia [ ] ; Fatigue [ ] ; Fever [ ] ; Chills [ ] ; Weakness [ ]   Cardiac: Chest pain/pressure [ ] ; Resting SOB [ ] ; Exertional SOB [ ] ; Orthopnea [ ] ; Pedal Edema [ ] ; Palpitations [ ] ; Syncope [ ] ; Presyncope [ ] ; Paroxysmal nocturnal dyspnea[ ]   Pulmonary: Cough [ ] ; Wheezing[ ] ; Hemoptysis[ ] ; Sputum [ ] ; Snoring [ ]   GI: Vomiting[ ] ; Dysphagia[ ] ; Melena[ ] ; Hematochezia [ ] ; Heartburn[ ] ; Abdominal pain [ ] ; Constipation [ ] ; Diarrhea [ ] ; BRBPR [ ]   GU: Hematuria[ ] ; Dysuria [ ] ; Nocturia[ ]   Vascular: Pain in legs with walking [ ] ; Pain in feet with lying flat [ ] ; Non-healing sores [ ] ; Stroke [ ] ; TIA [ ] ; Slurred speech [ ] ;  Neuro: Headaches[ ] ; Vertigo[ ] ; Seizures[ ] ; Paresthesias[ ] ;Blurred vision [ ] ; Diplopia [ ] ; Vision changes [ ]   Ortho/Skin: Arthritis [ ] ; Joint pain [ ] ; Muscle pain [ ] ; Joint swelling [ ] ; Back Pain [ ] ; Rash [ ]   Psych: Depression[ ] ; Anxiety[ ]   Heme: Bleeding problems [ ] ; Clotting disorders [ ] ; Anemia [ ]   Endocrine: Diabetes [ ] ; Thyroid dysfunction[ ]    Past Medical History:  Diagnosis Date   CHF (congestive heart failure) (HCC)    Gestational diabetes    With  first pregnancy   History of cesarean delivery 04/23/2016   Hypertension    PE (pulmonary thromboembolism) (HCC)    Sleep apnea     Current Outpatient Medications  Medication Sig Dispense Refill   empagliflozin  (JARDIANCE ) 10 MG TABS tablet Take 1 tablet (10 mg total) by mouth daily. 30 tablet 0   losartan  (COZAAR ) 25 MG tablet Take 0.5 tablets (12.5 mg total) by mouth daily. 30 tablet 0   metFORMIN  (GLUCOPHAGE ) 500 MG tablet Take 1 tablet (500 mg total) by mouth daily with breakfast. 30 tablet 0   OXYGEN Inhale 3 L/min into the lungs at bedtime.     rivaroxaban  (XARELTO ) 20 MG TABS tablet Take 1 tablet (20 mg total) by  mouth daily. 60 tablet 11   spironolactone  (ALDACTONE ) 25 MG tablet Take 1 tablet (25 mg total) by mouth daily. 30 tablet 0   torsemide  (DEMADEX ) 20 MG tablet Take 1 tablet (20 mg total) by mouth daily. 30 tablet 0   TYLENOL  500 MG tablet Take 1 tablet (500 mg total) by mouth every 6 (six) hours as needed (for headaches).     No current facility-administered medications for this visit.    Allergies  Allergen Reactions   Strawberry Extract Anaphylaxis      Social History   Socioeconomic History   Marital status: Single    Spouse name: Not on file   Number of children: 2   Years of education: Not on file   Highest education level: High school graduate  Occupational History   Occupation: [employer]  Tobacco Use   Smoking status: Never   Smokeless tobacco: Never  Vaping Use   Vaping status: Never Used  Substance and Sexual Activity   Alcohol use: No   Drug use: No    Types: Marijuana    Comment: before admission, smokes everyday   Sexual activity: Yes    Birth control/protection: None  Other Topics Concern   Not on file  Social History Narrative   Not on file   Social Drivers of Health   Financial Resource Strain: High Risk (09/08/2022)   Overall Financial Resource Strain (CARDIA)    Difficulty of Paying Living Expenses: Hard  Food Insecurity: Food Insecurity Present (01/03/2024)   Hunger Vital Sign    Worried About Running Out of Food in the Last Year: Sometimes true    Ran Out of Food in the Last Year: Sometimes true  Transportation Needs: No Transportation Needs (01/03/2024)   PRAPARE - Administrator, Civil Service (Medical): No    Lack of Transportation (Non-Medical): No  Physical Activity: Not on file  Stress: Not on file  Social Connections: Unknown (01/03/2024)   Social Connection and Isolation Panel    Frequency of Communication with Friends and Family: Not on file    Frequency of Social Gatherings with Friends and Family: Not on file     Attends Religious Services: Not on file    Active Member of Clubs or Organizations: Not on file    Attends Banker Meetings: Not on file    Marital Status: Never married  Intimate Partner Violence: Not At Risk (01/03/2024)   Humiliation, Afraid, Rape, and Kick questionnaire    Fear of Current or Ex-Partner: No    Emotionally Abused: No    Physically Abused: No    Sexually Abused: No      Family History  Problem Relation Age of Onset   Migraines Mother    Hypertension Mother  Hyperlipidemia Mother    Hypertension Father    Hyperlipidemia Father    Asthma Sister    Cancer Neg Hx    Diabetes Neg Hx     There were no vitals filed for this visit.   PHYSICAL EXAM: General:  Well appearing. No respiratory difficulty HEENT: normal Neck: supple. no JVD. Carotids 2+ bilat; no bruits. No lymphadenopathy or thyromegaly appreciated. Cor: PMI nondisplaced. Regular rate & rhythm. No rubs, gallops or murmurs. Lungs: clear Abdomen: soft, nontender, nondistended. No hepatosplenomegaly. No bruits or masses. Good bowel sounds. Extremities: no cyanosis, clubbing, rash, edema Neuro: alert & oriented x 3, cranial nerves grossly intact. moves all 4 extremities w/o difficulty. Affect pleasant.  ECG:   ASSESSMENT & PLAN:  Chronic heart failure with preserved EF: Mildly hypervolemic today, though improved on loop diuretics. Suspect etiology primarily cardiometabolic, discussed need for weight loss and OSA therapy compliance. No ischemic evaluation, but given that she has primarily HFpEF physiology and is without anginal symptoms can likely defer at this time.  - Start jardiance  10mg  daily, UTI risks discussed - Continue spironolactone  25mg  daily - Continue torsemide  20mg  daily - Weight loss as below - No need for further advanced heart failure clinic follow up, RV appears normal, no significant PH. Can refer back if any further clinical issues   Pulmonary embolism: - Noted in  2024, no evidence of Right heart strain, none on most recent study - Continue xarelto  20mg  daily   Hypertension: - Mildly elevated in clinic today - Management as above   Morbid obesity:  - Would benefit from continued GLP-1 use, follow up with PCP to get back on Trulicity    Sleep apnea:  - Continue BiPAP therapy, compliant   Caffie Shed, PA-C 01/25/24

## 2024-01-29 ENCOUNTER — Telehealth (HOSPITAL_COMMUNITY): Payer: Self-pay

## 2024-01-29 NOTE — Telephone Encounter (Signed)
 Called to confirm/remind patient of their appointment at the Advanced Heart Failure Clinic on 01/29/2024.   Appointment:   [] Confirmed  [x] Left mess   [] No answer/No voice mail  [] VM Full/unable to leave message  [] Phone not in service  Patient reminded to bring all medications and/or complete list.  Confirmed patient has transportation. Gave directions, instructed to utilize valet parking.

## 2024-02-01 ENCOUNTER — Ambulatory Visit (HOSPITAL_COMMUNITY)

## 2024-02-02 ENCOUNTER — Institutional Professional Consult (permissible substitution): Admitting: Nurse Practitioner

## 2024-02-15 ENCOUNTER — Telehealth (HOSPITAL_COMMUNITY): Payer: Self-pay

## 2024-02-15 NOTE — Telephone Encounter (Signed)
 Called to confirm/remind patient of their appointment at the Advanced [facility] Failure Clinic on 02/16/24 9:30.   Appointment:   [] Confirmed  [x] Left mess   [] No answer/No voice mail  [] VM Full/unable to leave message  [] Phone not in service  Patient reminded to bring all medications and/or complete list.  Confirmed patient has transportation. Gave directions, instructed to utilize valet parking.

## 2024-02-15 NOTE — Progress Notes (Signed)
 Advanced Heart Failure Clinic Note   Referring Physician: PCP: Celestia Rosaline SQUIBB, NP PCP-Cardiologist: Alvan Ronal BRAVO, MD (Inactive)  AHF: Dr. Zenaida   Chief Complaint:  [facility] F/u for Heart Failure  HPI: 33 y.o. female with a PMH of morbid obesity, HFmrEF, OSA  and h/o PE in 2024. Echo 11/24 at time of PE EF 45-50%, RV mildly reduced. Most recent echo 9/25 EF improved 60-65%, RV moderately reduced.   She was recently seen by Dr. Zenaida 8/25 and felt to be stable from HF standpoint and not felt to need any further advanced heart failure clinic f/u. She was referred back to gen cards w/ f/u in the [facility] on a PRN basis.   She presents today after being referred back for post hospital f/u for a/c CHF. Apparently she has not been adherent to a fluid restriction diet. She was admitted by internal medicine 10/25. Placed on IV furosemide  for diuresis, negative fluid balance was achieved, - 6,560 ml with significant improvement in her symptoms. GDMT continued.   Today she returns for post hospital follow up. Overall feeling good. Denies palpitations, CP, dizziness, edema, or PND/Orthopnea. SOB with ambulation. Appetite ok, tries to avoid salty foods. Eats out a lot while at work (works at [employer]).  No fever or chills. Weighs at home but could not recall last weight. Taking all medications. Denies ETOH and tobacco. Smokes marijuana several times a week.    Echo 12/10/23  1. Left ventricular ejection fraction, by estimation, is 60 to 65%. The  left ventricle has normal function. The left ventricle has no regional  wall motion abnormalities. There is mild concentric left ventricular  hypertrophy. Indeterminate diastolic  filling due to E-A fusion.   2. Right ventricular systolic function is moderately reduced. The right  ventricular size is moderately enlarged. Tricuspid regurgitation signal is  inadequate for assessing PA pressure.   3. The mitral valve is normal in structure. No  evidence of mitral valve  regurgitation. No evidence of mitral stenosis.   4. The aortic valve is tricuspid. There is mild calcification of the  aortic valve. Aortic valve regurgitation is not visualized. Aortic valve  sclerosis/calcification is present, without any evidence of aortic  stenosis.   5. The inferior vena cava is normal in size with greater than 50%  respiratory variability, suggesting right atrial pressure of 3 mmHg.    Past Medical History:  Diagnosis Date   CHF (congestive heart failure) (HCC)    Gestational diabetes    With first pregnancy   History of cesarean delivery 04/23/2016   Hypertension    PE (pulmonary thromboembolism) (HCC)    Sleep apnea     Current Outpatient Medications  Medication Sig Dispense Refill   empagliflozin  (JARDIANCE ) 10 MG TABS tablet Take 1 tablet (10 mg total) by mouth daily. 30 tablet 0   losartan  (COZAAR ) 25 MG tablet Take 0.5 tablets (12.5 mg total) by mouth daily. 30 tablet 0   metFORMIN  (GLUCOPHAGE ) 500 MG tablet Take 1 tablet (500 mg total) by mouth daily with breakfast. 30 tablet 0   OXYGEN Inhale 3 L/min into the lungs at bedtime.     spironolactone  (ALDACTONE ) 25 MG tablet Take 1 tablet (25 mg total) by mouth daily. 30 tablet 0   torsemide  (DEMADEX ) 20 MG tablet Take 1 tablet (20 mg total) by mouth daily. 30 tablet 0   TYLENOL  500 MG tablet Take 1 tablet (500 mg total) by mouth every 6 (six) hours as needed (for  headaches).     rivaroxaban  (XARELTO ) 20 MG TABS tablet Take 1 tablet (20 mg total) by mouth daily. (Patient not taking: Reported on 02/16/2024) 60 tablet 11   No current facility-administered medications for this encounter.   Allergies  Allergen Reactions   Strawberry Extract Anaphylaxis   Social History   Socioeconomic History   Marital status: Single    Spouse name: Not on file   Number of children: 2   Years of education: Not on file   Highest education level: High school graduate  Occupational History    Occupation: [employer]  Tobacco Use   Smoking status: Never   Smokeless tobacco: Never  Vaping Use   Vaping status: Never Used  Substance and Sexual Activity   Alcohol use: No   Drug use: Yes    Types: Marijuana    Comment: before admission, smokes everyday   Sexual activity: Yes    Birth control/protection: None  Other Topics Concern   Not on file  Social History Narrative   Not on file   Social Drivers of Health   Financial Resource Strain: High Risk (09/08/2022)   Overall Financial Resource Strain (CARDIA)    Difficulty of Paying Living Expenses: Hard  Food Insecurity: Food Insecurity Present (01/03/2024)   Hunger Vital Sign    Worried About Running Out of Food in the Last Year: Sometimes true    Ran Out of Food in the Last Year: Sometimes true  Transportation Needs: No Transportation Needs (01/03/2024)   PRAPARE - Administrator, Civil Service (Medical): No    Lack of Transportation (Non-Medical): No  Physical Activity: Not on file  Stress: Not on file  Social Connections: Unknown (01/03/2024)   Social Connection and Isolation Panel    Frequency of Communication with Friends and Family: Not on file    Frequency of Social Gatherings with Friends and Family: Not on file    Attends Religious Services: Not on file    Active Member of Clubs or Organizations: Not on file    Attends Banker Meetings: Not on file    Marital Status: Never married  Intimate Partner Violence: Not At Risk (01/03/2024)   Humiliation, Afraid, Rape, and Kick questionnaire    Fear of Current or Ex-Partner: No    Emotionally Abused: No    Physically Abused: No    Sexually Abused: No    Family History  Problem Relation Age of Onset   Migraines Mother    Hypertension Mother    Hyperlipidemia Mother    Hypertension Father    Hyperlipidemia Father    Asthma Sister    Cancer Neg Hx    Diabetes Neg Hx     Vitals:   02/16/24 1002  BP: 126/82  Pulse: (!) 104  SpO2: (!)  87%  Weight: (!) 157.4 kg (347 lb)  Height: 5' 2 (1.575 m)   PHYSICAL EXAM: General:  obese appearing.  No respiratory difficulty. Walked into clinic Neck: JVD difficult to see but does not appear elevated.   Cor: Regular rate & rhythm. No murmurs. Lungs: diminished d/t body habitus Extremities: no edema  Neuro: alert & oriented x 3. Affect pleasant.   Wt Readings from Last 3 Encounters:  02/16/24 (!) 157.4 kg (347 lb)  01/05/24 (!) 152 kg (335 lb 1.6 oz)  12/31/23 (!) 160.1 kg (353 lb)    ECG: none today  ASSESSMENT & PLAN: Chronic heart failure with preserved EF:  Suspect etiology primarily cardiometabolic, discussed need  for weight loss and OSA therapy compliance. No ischemic evaluation, but given that she has primarily HFpEF physiology and is without anginal symptoms can likely defer at this time.  - NYHA II-IIIa. Volume Euvolemic on exam. Continue torsemide  20mg  daily - Continue jardiance  10mg  daily, denies GU symptoms - Continue spironolactone  25mg  daily - Increase losartan  25 mg daily. BMET/BNP today. Repeat BMET in 1 week.  - Weight loss as below - No need for further advanced heart failure clinic follow up. RV mod reduced, no significant PH. Can refer back if any further clinical issues   Pulmonary embolism: - Noted in 2024, no evidence of Right heart strain, none on most recent study - Continue xarelto  20mg  daily   Hypertension: - Mildly elevated in clinic today - Management as above   Morbid obesity:  - Would benefit from continued GLP-1 use, follow up with PCP to get back on Trulicity . PCP list provided today per patient request. Will try to get her in with her current PCP for now.    Sleep apnea:  - Continue BiPAP therapy, compliant  Follow up with [facility], referral placed today and we reached out to the clinic for scheduling. Does not need AHF follow up at this time.   Beckey LITTIE Coe, NP 02/16/24

## 2024-02-16 ENCOUNTER — Encounter (HOSPITAL_COMMUNITY): Payer: Self-pay

## 2024-02-16 ENCOUNTER — Other Ambulatory Visit (HOSPITAL_BASED_OUTPATIENT_CLINIC_OR_DEPARTMENT_OTHER): Payer: Self-pay

## 2024-02-16 ENCOUNTER — Ambulatory Visit (HOSPITAL_COMMUNITY)
Admission: RE | Admit: 2024-02-16 | Discharge: 2024-02-16 | Disposition: A | Discharge status: Home / Self Care | Source: Ambulatory Visit | Attending: Internal Medicine | Admitting: Internal Medicine

## 2024-02-16 ENCOUNTER — Ambulatory Visit (HOSPITAL_COMMUNITY): Payer: Self-pay | Admitting: Internal Medicine

## 2024-02-16 VITALS — BP 126/82 | HR 104 | Ht 62.0 in | Wt 347.0 lb

## 2024-02-16 DIAGNOSIS — I358 Other nonrheumatic aortic valve disorders: Secondary | ICD-10-CM | POA: Insufficient documentation

## 2024-02-16 DIAGNOSIS — I2699 Other pulmonary embolism without acute cor pulmonale: Secondary | ICD-10-CM | POA: Diagnosis not present

## 2024-02-16 DIAGNOSIS — G4733 Obstructive sleep apnea (adult) (pediatric): Secondary | ICD-10-CM | POA: Insufficient documentation

## 2024-02-16 DIAGNOSIS — I5032 Chronic diastolic (congestive) heart failure: Secondary | ICD-10-CM | POA: Insufficient documentation

## 2024-02-16 DIAGNOSIS — E66813 Obesity, class 3: Secondary | ICD-10-CM | POA: Diagnosis not present

## 2024-02-16 DIAGNOSIS — G4730 Sleep apnea, unspecified: Secondary | ICD-10-CM

## 2024-02-16 DIAGNOSIS — Z7901 Long term (current) use of anticoagulants: Secondary | ICD-10-CM | POA: Insufficient documentation

## 2024-02-16 DIAGNOSIS — Z7984 Long term (current) use of oral hypoglycemic drugs: Secondary | ICD-10-CM | POA: Insufficient documentation

## 2024-02-16 DIAGNOSIS — I10 Essential (primary) hypertension: Secondary | ICD-10-CM | POA: Diagnosis not present

## 2024-02-16 DIAGNOSIS — Z79899 Other long term (current) drug therapy: Secondary | ICD-10-CM | POA: Insufficient documentation

## 2024-02-16 DIAGNOSIS — Z86711 Personal history of pulmonary embolism: Secondary | ICD-10-CM | POA: Insufficient documentation

## 2024-02-16 DIAGNOSIS — R0602 Shortness of breath: Secondary | ICD-10-CM | POA: Insufficient documentation

## 2024-02-16 DIAGNOSIS — I110 Hypertensive heart disease with heart failure: Secondary | ICD-10-CM | POA: Insufficient documentation

## 2024-02-16 LAB — BASIC METABOLIC PANEL WITH GFR
Anion gap: 6 (ref 5–15)
BUN: 10 mg/dL (ref 6–20)
CO2: 31 mmol/L (ref 22–32)
Calcium: 9.1 mg/dL (ref 8.9–10.3)
Chloride: 98 mmol/L (ref 98–111)
Creatinine, Ser: 0.78 mg/dL (ref 0.44–1.00)
GFR, Estimated: >60 mL/min (ref >60)
Glucose, Bld: 107 mg/dL — ABNORMAL HIGH (ref 70–99)
Potassium: 4.6 mmol/L (ref 3.5–5.1)
Sodium: 135 mmol/L (ref 135–145)

## 2024-02-16 LAB — BRAIN NATRIURETIC PEPTIDE: B Natriuretic Peptide: 70.9 pg/mL (ref 0.0–100.0)

## 2024-02-16 MED ORDER — LOSARTAN POTASSIUM 25 MG PO TABS
25.0000 mg | ORAL_TABLET | Freq: Every day | ORAL | 1 refills | Status: DC
  Filled 2024-02-16: qty 90, 90d supply, fill #0

## 2024-02-16 NOTE — Patient Instructions (Addendum)
 Good to see you today!  INCREASE losartan  to 25 mg (1 tablet)daily  Labs done today, your results will be available in MyChart, we will contact you for abnormal readings.  Follow up appointment made for PCP  You have been referred to cardiology they will call to schedule an appointment  Your physician recommends that you schedule a follow-up appointment with cardiology   Thank you for choosing  [facility]

## 2024-02-22 ENCOUNTER — Telehealth: Admitting: Family Medicine

## 2024-02-22 DIAGNOSIS — I5022 Chronic systolic (congestive) heart failure: Secondary | ICD-10-CM

## 2024-02-22 DIAGNOSIS — Z6841 Body Mass Index (BMI) 40.0 and over, adult: Secondary | ICD-10-CM

## 2024-02-22 DIAGNOSIS — E119 Type 2 diabetes mellitus without complications: Secondary | ICD-10-CM

## 2024-02-22 DIAGNOSIS — G4733 Obstructive sleep apnea (adult) (pediatric): Secondary | ICD-10-CM | POA: Diagnosis not present

## 2024-02-22 DIAGNOSIS — Z7984 Long term (current) use of oral hypoglycemic drugs: Secondary | ICD-10-CM

## 2024-02-22 NOTE — Progress Notes (Signed)
 Office: [phone]  /  Fax: [phone]   Initial Visit I connected with  Christine Singleton on 02/22/24 by a video and audio enabled telemedicine application and verified that I am speaking with the correct person using two identifiers.  Patient Location: Other:  at work, alone  Provider Location: Office/Clinic  Persons Participating in Visit: Patient.  I discussed the limitations of evaluation and management by telemedicine. The patient expressed understanding and agreed to proceed.   Vital Signs: Because this visit was a virtual/telehealth visit, some criteria may be missing or patient reported. Any vitals not documented were not able to be obtained and vitals that have been documented are patient reported.      Christine Singleton was seen in clinic today to evaluate for obesity. She is interested in losing weight to improve overall health and reduce the risk of weight related complications. She presents today to review program treatment options, initial physical assessment, and evaluation.     She was referred by: Specialist cardiology  When asked what else they would like to accomplish? She states: Adopt a healthier eating pattern and lifestyle, Improve energy levels and physical activity, Improve existing medical conditions, Reduce number of medications, and Improve quality of life  Weight history: she has been overweight since early childhood but gained more weight mid 20s after having 2 kids and dealing w/ depression.  Reports a long hx of emotional eating.    When asked how has your weight affected you? She states: Contributed to medical problems, Having fatigue, and Having poor endurance  Some associated conditions: Hypertension, OSA, Diabetes, and Heart Failure  secondary amenorrhea; hx of PE  Contributing factors: consumption of processed foods, moderate to high levels of stress, reduced physical activity, and chronic skipping of meals  Weight promoting medications  identified: None  Current nutrition plan: None  Current level of physical activity: None  Current or previous pharmacotherapy: GLP-1  Response to medication: Had side effects so it was discontinued Had constipation from Ozempic  Did well on Trulicity  but ran out about 5 mos ago  Past medical history includes:   Past Medical History:  Diagnosis Date   CHF (congestive heart failure) (HCC)    Gestational diabetes    With first pregnancy   History of cesarean delivery 04/23/2016   Hypertension    PE (pulmonary thromboembolism) (HCC)    Sleep apnea      Objective:   There were no vitals taken for this visit. This visit was via telehealth General:  Alert, oriented and cooperative. Patient is in no acute distress.  Respiratory: Normal respiratory effort, no problems with respiration noted   Gait: able to ambulate independently  Mental Status: Normal mood and affect. Normal behavior. Normal judgment and thought content.   DIAGNOSTIC DATA REVIEWED:  BMET    Component Value Date/Time   NA 135 02/16/2024 1044   K 4.6 02/16/2024 1044   CL 98 02/16/2024 1044   CO2 31 02/16/2024 1044   GLUCOSE 107 (H) 02/16/2024 1044   BUN 10 02/16/2024 1044   CREATININE 0.78 02/16/2024 1044   CALCIUM 9.1 02/16/2024 1044   GFRNONAA >60 02/16/2024 1044   GFRAA >60 06/19/2015 1125   Lab Results  Component Value Date   HGBA1C 6.6 (H) 01/27/2023   HGBA1C 7.3 (H) 08/17/2022   No results found for: INSULIN  CBC    Component Value Date/Time   WBC 14.7 (H) 01/04/2024 0221   RBC 6.64 (H) 01/04/2024 0221   HGB 15.3 (H) 01/04/2024 0221  HCT 47.9 (H) 01/04/2024 0221   PLT 471 (H) 01/04/2024 0221   MCV 72.1 (L) 01/04/2024 0221   MCH 23.0 (L) 01/04/2024 0221   MCHC 31.9 01/04/2024 0221   RDW 19.3 (H) 01/04/2024 0221   Iron/TIBC/Ferritin/ %Sat    Component Value Date/Time   IRON 25 (L) 08/19/2022 0159   TIBC 554 (H) 08/19/2022 0159   FERRITIN 20 08/19/2022 0159   IRONPCTSAT 5 (L)  08/19/2022 0159   Lipid Panel  No results found for: CHOL, TRIG, HDL, CHOLHDL, VLDL, LDLCALC, LDLDIRECT Hepatic Function Panel     Component Value Date/Time   PROT 7.0 12/31/2023 1627   ALBUMIN 3.1 (L) 12/31/2023 1627   AST 24 12/31/2023 1627   ALT 15 12/31/2023 1627   ALKPHOS 121 12/31/2023 1627   BILITOT 0.7 12/31/2023 1627      Component Value Date/Time   TSH 1.331 01/03/2024 1453     Assessment and Plan:   OSA (obstructive sleep apnea) Wearing CPAP nightly for OSA + O2 at night Begin active plan for weight reduction  Morbid obesity (HCC)  BMI 60.0-69.9, adult (HCC)  Non-insulin  dependent type 2 diabetes mellitus (HCC) Lab Results  Component Value Date   HGBA1C 6.6 (H) 01/27/2023   She has fair control of diabetes on Jardiance  10 mg daily and metformin  500 mg daily but reports a poor diet with lack of regular exercise.  Obtain labs, metabolic rate and begin prescribed eating plan next visit.  She would benefit from resuming a GLP-1 RA.  Chronic systolic heart failure (HCC) Echocardiogram reviewed from 11/19/23 showing LVEF 60-65% with mild LVH RV systolic function moderately reduced with moderately enlarged RV size She is managed by Olivia Pavy PA at [facility], last seen 9/29 Continue current medications Begin active plan for weight reduction      Obesity Treatment / Action Plan:  Patient will work on garnering support from family and friends to begin weight loss journey. Will work on eliminating or reducing the presence of highly palatable, calorie dense foods in the home. Will complete provided nutritional and psychosocial assessment questionnaire before the next appointment. Will be scheduled for indirect calorimetry to determine resting energy expenditure in a fasting state.  This will allow us  to create a reduced calorie, high-protein meal plan to promote loss of fat mass while preserving muscle mass. Will think about ideas on how to  incorporate physical activity into their daily routine. Was counseled on nutritional approaches to weight loss and benefits of reducing processed foods and consuming plant-based foods and high quality protein as part of nutritional weight management. Was counseled on pharmacotherapy and role as an adjunct in weight management.   Obesity Education Performed Today:  She was weighed on the bioimpedance scale and results were discussed and documented in the synopsis.  We discussed obesity as a disease and the importance of a more detailed evaluation of all the factors contributing to the disease.  We discussed the importance of long term lifestyle changes which include nutrition, exercise and behavioral modifications as well as the importance of customizing this to her specific health and social needs.  We discussed the benefits of reaching a healthier weight to alleviate the symptoms of existing conditions and reduce the risks of the biomechanical, metabolic and psychological effects of obesity.  Glorimar Bradner appears to be in the action stage of change and states they are ready to start intensive lifestyle modifications and behavioral modifications.  30 minutes was spent today on this visit including the  above counseling, pre-visit chart review, and post-visit documentation.  Reviewed by clinician on day of visit: allergies, medications, problem list, medical history, surgical history, family history, social history, and previous encounter notes pertinent to obesity diagnosis.    Darice Haddock, D.O. DABFM, [facility] [address] [place], [place] [address] [phone]

## 2024-03-07 ENCOUNTER — Ambulatory Visit (INDEPENDENT_AMBULATORY_CARE_PROVIDER_SITE_OTHER): Payer: Self-pay | Admitting: Primary Care

## 2024-03-14 ENCOUNTER — Emergency Department (HOSPITAL_BASED_OUTPATIENT_CLINIC_OR_DEPARTMENT_OTHER)

## 2024-03-14 ENCOUNTER — Ambulatory Visit (INDEPENDENT_AMBULATORY_CARE_PROVIDER_SITE_OTHER): Admitting: Primary Care

## 2024-03-14 ENCOUNTER — Other Ambulatory Visit: Payer: Self-pay

## 2024-03-14 ENCOUNTER — Inpatient Hospital Stay (HOSPITAL_BASED_OUTPATIENT_CLINIC_OR_DEPARTMENT_OTHER)
Admission: EM | Admit: 2024-03-14 | Discharge: 2024-03-23 | DRG: 871 | Disposition: A | Discharge status: Home / Self Care | Attending: Internal Medicine | Admitting: Internal Medicine

## 2024-03-14 ENCOUNTER — Encounter (HOSPITAL_BASED_OUTPATIENT_CLINIC_OR_DEPARTMENT_OTHER): Payer: Self-pay

## 2024-03-14 DIAGNOSIS — Z91018 Allergy to other foods: Secondary | ICD-10-CM | POA: Diagnosis not present

## 2024-03-14 DIAGNOSIS — J101 Influenza due to other identified influenza virus with other respiratory manifestations: Secondary | ICD-10-CM | POA: Diagnosis present

## 2024-03-14 DIAGNOSIS — E662 Morbid (severe) obesity with alveolar hypoventilation: Secondary | ICD-10-CM | POA: Diagnosis present

## 2024-03-14 DIAGNOSIS — J09X1 Influenza due to identified novel influenza A virus with pneumonia: Secondary | ICD-10-CM | POA: Diagnosis not present

## 2024-03-14 DIAGNOSIS — I5043 Acute on chronic combined systolic (congestive) and diastolic (congestive) heart failure: Secondary | ICD-10-CM | POA: Diagnosis present

## 2024-03-14 DIAGNOSIS — J1008 Influenza due to other identified influenza virus with other specified pneumonia: Secondary | ICD-10-CM | POA: Diagnosis not present

## 2024-03-14 DIAGNOSIS — J188 Other pneumonia, unspecified organism: Secondary | ICD-10-CM | POA: Insufficient documentation

## 2024-03-14 DIAGNOSIS — J9622 Acute and chronic respiratory failure with hypercapnia: Secondary | ICD-10-CM | POA: Diagnosis present

## 2024-03-14 DIAGNOSIS — Z79899 Other long term (current) drug therapy: Secondary | ICD-10-CM

## 2024-03-14 DIAGNOSIS — Z87891 Personal history of nicotine dependence: Secondary | ICD-10-CM | POA: Diagnosis not present

## 2024-03-14 DIAGNOSIS — G4733 Obstructive sleep apnea (adult) (pediatric): Secondary | ICD-10-CM | POA: Diagnosis present

## 2024-03-14 DIAGNOSIS — J1001 Influenza due to other identified influenza virus with the same other identified influenza virus pneumonia: Secondary | ICD-10-CM | POA: Diagnosis present

## 2024-03-14 DIAGNOSIS — Z7984 Long term (current) use of oral hypoglycemic drugs: Secondary | ICD-10-CM | POA: Diagnosis not present

## 2024-03-14 DIAGNOSIS — E871 Hypo-osmolality and hyponatremia: Secondary | ICD-10-CM | POA: Diagnosis present

## 2024-03-14 DIAGNOSIS — I5032 Chronic diastolic (congestive) heart failure: Secondary | ICD-10-CM | POA: Diagnosis not present

## 2024-03-14 DIAGNOSIS — I110 Hypertensive heart disease with heart failure: Secondary | ICD-10-CM | POA: Diagnosis present

## 2024-03-14 DIAGNOSIS — Z6841 Body Mass Index (BMI) 40.0 and over, adult: Secondary | ICD-10-CM

## 2024-03-14 DIAGNOSIS — Z713 Dietary counseling and surveillance: Secondary | ICD-10-CM

## 2024-03-14 DIAGNOSIS — J9601 Acute respiratory failure with hypoxia: Secondary | ICD-10-CM | POA: Diagnosis present

## 2024-03-14 DIAGNOSIS — G9341 Metabolic encephalopathy: Secondary | ICD-10-CM | POA: Insufficient documentation

## 2024-03-14 DIAGNOSIS — Z7901 Long term (current) use of anticoagulants: Secondary | ICD-10-CM

## 2024-03-14 DIAGNOSIS — I10 Essential (primary) hypertension: Secondary | ICD-10-CM | POA: Diagnosis present

## 2024-03-14 DIAGNOSIS — I428 Other cardiomyopathies: Secondary | ICD-10-CM | POA: Diagnosis present

## 2024-03-14 DIAGNOSIS — R6520 Severe sepsis without septic shock: Secondary | ICD-10-CM | POA: Diagnosis present

## 2024-03-14 DIAGNOSIS — Z86711 Personal history of pulmonary embolism: Secondary | ICD-10-CM | POA: Diagnosis not present

## 2024-03-14 DIAGNOSIS — J9621 Acute and chronic respiratory failure with hypoxia: Secondary | ICD-10-CM | POA: Diagnosis present

## 2024-03-14 DIAGNOSIS — E119 Type 2 diabetes mellitus without complications: Secondary | ICD-10-CM | POA: Diagnosis present

## 2024-03-14 DIAGNOSIS — Z8249 Family history of ischemic heart disease and other diseases of the circulatory system: Secondary | ICD-10-CM

## 2024-03-14 DIAGNOSIS — E66813 Obesity, class 3: Secondary | ICD-10-CM | POA: Diagnosis present

## 2024-03-14 DIAGNOSIS — J9602 Acute respiratory failure with hypercapnia: Secondary | ICD-10-CM | POA: Diagnosis not present

## 2024-03-14 DIAGNOSIS — I5033 Acute on chronic diastolic (congestive) heart failure: Secondary | ICD-10-CM | POA: Diagnosis present

## 2024-03-14 DIAGNOSIS — A4189 Other specified sepsis: Secondary | ICD-10-CM | POA: Diagnosis present

## 2024-03-14 DIAGNOSIS — G4730 Sleep apnea, unspecified: Secondary | ICD-10-CM

## 2024-03-14 LAB — I-STAT ARTERIAL BLOOD GAS, ED
Acid-Base Excess: 9 mmol/L — ABNORMAL HIGH (ref 0.0–2.0)
Acid-Base Excess: 12 mmol/L — ABNORMAL HIGH (ref 0.0–2.0)
Bicarbonate: 37.6 mmol/L — ABNORMAL HIGH (ref 20.0–28.0)
Bicarbonate: 42.9 mmol/L — ABNORMAL HIGH (ref 20.0–28.0)
Calcium, Ion: 1.22 mmol/L (ref 1.15–1.40)
Calcium, Ion: 1.19 mmol/L (ref 1.15–1.40)
HCT: 44 % (ref 36.0–46.0)
HCT: 45 % (ref 36.0–46.0)
Hemoglobin: 15 g/dL (ref 12.0–15.0)
Hemoglobin: 15.3 g/dL — ABNORMAL HIGH (ref 12.0–15.0)
O2 Saturation: 92 %
O2 Saturation: 97 %
Patient temperature: 97.9
Potassium: 4.8 mmol/L (ref 3.5–5.1)
Potassium: 4.3 mmol/L (ref 3.5–5.1)
Sodium: 133 mmol/L — ABNORMAL LOW (ref 135–145)
Sodium: 135 mmol/L (ref 135–145)
TCO2: 40 mmol/L — ABNORMAL HIGH (ref 22–32)
TCO2: 45 mmol/L — ABNORMAL HIGH (ref 22–32)
pCO2 arterial: 66.9 mmHg (ref 32–48)
pCO2 arterial: 82.8 mmHg (ref 32–48)
pH, Arterial: 7.357 (ref 7.35–7.45)
pH, Arterial: 7.321 — ABNORMAL LOW (ref 7.35–7.45)
pO2, Arterial: 71 mmHg — ABNORMAL LOW (ref 83–108)
pO2, Arterial: 102 mmHg (ref 83–108)

## 2024-03-14 LAB — CBC WITH DIFFERENTIAL/PLATELET
Abs Immature Granulocytes: 0.05 K/uL (ref 0.00–0.07)
Basophils Absolute: 0.1 K/uL (ref 0.0–0.1)
Basophils Relative: 1 %
Eosinophils Absolute: 0.1 K/uL (ref 0.0–0.5)
Eosinophils Relative: 1 %
HCT: 41.2 % (ref 36.0–46.0)
Hemoglobin: 12.9 g/dL (ref 12.0–15.0)
Immature Granulocytes: 1 %
Lymphocytes Relative: 12 %
Lymphs Abs: 1.3 K/uL (ref 0.7–4.0)
MCH: 22.1 pg — ABNORMAL LOW (ref 26.0–34.0)
MCHC: 31.3 g/dL (ref 30.0–36.0)
MCV: 70.5 fL — ABNORMAL LOW (ref 80.0–100.0)
Monocytes Absolute: 1 K/uL (ref 0.1–1.0)
Monocytes Relative: 9 %
Neutro Abs: 8.6 K/uL — ABNORMAL HIGH (ref 1.7–7.7)
Neutrophils Relative %: 76 %
Platelets: 369 K/uL (ref 150–400)
RBC: 5.84 MIL/uL — ABNORMAL HIGH (ref 3.87–5.11)
RDW: 20.1 % — ABNORMAL HIGH (ref 11.5–15.5)
WBC: 11 K/uL — ABNORMAL HIGH (ref 4.0–10.5)
nRBC: 0.4 % — ABNORMAL HIGH (ref 0.0–0.2)

## 2024-03-14 LAB — URINALYSIS, ROUTINE W REFLEX MICROSCOPIC
Bilirubin Urine: NEGATIVE
Glucose, UA: 100 mg/dL — AB
Ketones, ur: NEGATIVE mg/dL
Leukocytes,Ua: NEGATIVE
Nitrite: NEGATIVE
Protein, ur: NEGATIVE mg/dL
Specific Gravity, Urine: 1.015 (ref 1.005–1.030)
pH: 7 (ref 5.0–8.0)

## 2024-03-14 LAB — GLUCOSE, CAPILLARY: Glucose-Capillary: 118 mg/dL — ABNORMAL HIGH (ref 70–99)

## 2024-03-14 LAB — LACTIC ACID, PLASMA: Lactic Acid, Venous: 0.7 mmol/L (ref 0.5–1.9)

## 2024-03-14 LAB — URINALYSIS, MICROSCOPIC (REFLEX)

## 2024-03-14 LAB — CBG MONITORING, ED: Glucose-Capillary: 106 mg/dL — ABNORMAL HIGH (ref 70–99)

## 2024-03-14 LAB — RESP PANEL BY RT-PCR (RSV, FLU A&B, COVID)  RVPGX2
Influenza A by PCR: NEGATIVE
Influenza B by PCR: POSITIVE — AB
Resp Syncytial Virus by PCR: NEGATIVE
SARS Coronavirus 2 by RT PCR: NEGATIVE

## 2024-03-14 LAB — PRO BRAIN NATRIURETIC PEPTIDE: Pro Brain Natriuretic Peptide: 2173 pg/mL — ABNORMAL HIGH

## 2024-03-14 LAB — COMPREHENSIVE METABOLIC PANEL WITH GFR
ALT: 12 U/L (ref 0–44)
AST: 27 U/L (ref 15–41)
Albumin: 3.8 g/dL (ref 3.5–5.0)
Alkaline Phosphatase: 127 U/L — ABNORMAL HIGH (ref 38–126)
Anion gap: 8 (ref 5–15)
BUN: 9 mg/dL (ref 6–20)
CO2: 32 mmol/L (ref 22–32)
Calcium: 9 mg/dL (ref 8.9–10.3)
Chloride: 94 mmol/L — ABNORMAL LOW (ref 98–111)
Creatinine, Ser: 0.69 mg/dL (ref 0.44–1.00)
GFR, Estimated: >60 mL/min
Glucose, Bld: 141 mg/dL — ABNORMAL HIGH (ref 70–99)
Potassium: 5.1 mmol/L (ref 3.5–5.1)
Sodium: 134 mmol/L — ABNORMAL LOW (ref 135–145)
Total Bilirubin: 0.6 mg/dL (ref 0.0–1.2)
Total Protein: 7.4 g/dL (ref 6.5–8.1)

## 2024-03-14 LAB — TROPONIN T, HIGH SENSITIVITY: Troponin T High Sensitivity: 16 ng/L (ref 0–19)

## 2024-03-14 LAB — HCG, SERUM, QUALITATIVE: Preg, Serum: NEGATIVE

## 2024-03-14 MED ORDER — CHLORHEXIDINE GLUCONATE CLOTH 2 % EX PADS
6.0000 | MEDICATED_PAD | Freq: Every day | CUTANEOUS | Status: DC
  Administered 2024-03-14 – 2024-03-17 (×4): 6 via TOPICAL

## 2024-03-14 MED ORDER — LOSARTAN POTASSIUM 50 MG PO TABS
25.0000 mg | ORAL_TABLET | Freq: Every evening | ORAL | Status: AC
  Administered 2024-03-14 – 2024-03-21 (×8): 25 mg via ORAL
  Filled 2024-03-14 (×8): qty 1

## 2024-03-14 MED ORDER — SODIUM CHLORIDE 0.9% FLUSH: 3.0000 mL | INTRAVENOUS | Status: AC | PRN

## 2024-03-14 MED ORDER — SODIUM CHLORIDE 0.9 % IV SOLN
100.0000 mg | Freq: Two times a day (BID) | INTRAVENOUS | Status: DC
  Administered 2024-03-14 – 2024-03-20 (×13): 100 mg via INTRAVENOUS
  Filled 2024-03-14 (×14): qty 100

## 2024-03-14 MED ORDER — ORAL CARE MOUTH RINSE
15.0000 mL | OROMUCOSAL | Status: DC
  Administered 2024-03-14 – 2024-03-22 (×22): 15 mL via OROMUCOSAL

## 2024-03-14 MED ORDER — SODIUM CHLORIDE 0.9 % IV SOLN: 500.0000 mg | Freq: Once | INTRAVENOUS | Status: DC

## 2024-03-14 MED ORDER — SODIUM CHLORIDE 0.9 % IV SOLN
2.0000 g | Freq: Once | INTRAVENOUS | Status: AC
  Administered 2024-03-14: 2 g via INTRAVENOUS
  Filled 2024-03-14: qty 20

## 2024-03-14 MED ORDER — ORAL CARE MOUTH RINSE: 15.0000 mL | OROMUCOSAL | Status: DC | PRN

## 2024-03-14 MED ORDER — TORSEMIDE 20 MG PO TABS
20.0000 mg | ORAL_TABLET | Freq: Every day | ORAL | Status: AC
  Administered 2024-03-14 – 2024-03-22 (×9): 20 mg via ORAL
  Filled 2024-03-14 (×9): qty 1

## 2024-03-14 MED ORDER — SODIUM CHLORIDE 0.9 % IV SOLN
1.0000 g | INTRAVENOUS | Status: AC
  Administered 2024-03-15 – 2024-03-19 (×5): 1 g via INTRAVENOUS
  Filled 2024-03-14 (×7): qty 10

## 2024-03-14 MED ORDER — ONDANSETRON HCL 4 MG PO TABS: 4.0000 mg | ORAL_TABLET | Freq: Four times a day (QID) | ORAL | Status: AC | PRN

## 2024-03-14 MED ORDER — HYDROCODONE-ACETAMINOPHEN 5-325 MG PO TABS
1.0000 | ORAL_TABLET | ORAL | Status: DC | PRN
  Administered 2024-03-15: 2 via ORAL
  Filled 2024-03-14: qty 2

## 2024-03-14 MED ORDER — IPRATROPIUM-ALBUTEROL 0.5-2.5 (3) MG/3ML IN SOLN
3.0000 mL | RESPIRATORY_TRACT | Status: DC | PRN
  Administered 2024-03-15 – 2024-03-20 (×3): 3 mL via RESPIRATORY_TRACT
  Filled 2024-03-14 (×3): qty 3

## 2024-03-14 MED ORDER — SODIUM CHLORIDE 0.9 % IV SOLN: 250.0000 mL | INTRAVENOUS | Status: AC | PRN

## 2024-03-14 MED ORDER — SPIRONOLACTONE 25 MG PO TABS
25.0000 mg | ORAL_TABLET | Freq: Every evening | ORAL | Status: DC
  Administered 2024-03-14 – 2024-03-21 (×8): 25 mg via ORAL
  Filled 2024-03-14 (×8): qty 1

## 2024-03-14 MED ORDER — ACETAMINOPHEN 325 MG PO TABS
650.0000 mg | ORAL_TABLET | Freq: Four times a day (QID) | ORAL | Status: DC | PRN
  Administered 2024-03-15 – 2024-03-19 (×8): 650 mg via ORAL
  Filled 2024-03-14 (×8): qty 2

## 2024-03-14 MED ORDER — SODIUM CHLORIDE 0.9% FLUSH
3.0000 mL | Freq: Two times a day (BID) | INTRAVENOUS | Status: AC
  Administered 2024-03-14 – 2024-03-23 (×13): 3 mL via INTRAVENOUS

## 2024-03-14 MED ORDER — IPRATROPIUM-ALBUTEROL 0.5-2.5 (3) MG/3ML IN SOLN
RESPIRATORY_TRACT | Status: AC
  Administered 2024-03-14: 3 mL
  Filled 2024-03-14: qty 3

## 2024-03-14 MED ORDER — OSELTAMIVIR PHOSPHATE 75 MG PO CAPS
75.0000 mg | ORAL_CAPSULE | Freq: Two times a day (BID) | ORAL | Status: AC
  Administered 2024-03-14 – 2024-03-18 (×9): 75 mg via ORAL
  Filled 2024-03-14 (×9): qty 1

## 2024-03-14 MED ORDER — RIVAROXABAN 20 MG PO TABS
20.0000 mg | ORAL_TABLET | Freq: Every day | ORAL | Status: DC
  Administered 2024-03-14 – 2024-03-22 (×9): 20 mg via ORAL
  Filled 2024-03-14 (×9): qty 1

## 2024-03-14 MED ORDER — ONDANSETRON HCL 4 MG/2ML IJ SOLN
4.0000 mg | Freq: Four times a day (QID) | INTRAMUSCULAR | Status: AC | PRN
  Administered 2024-03-15: 4 mg via INTRAVENOUS
  Filled 2024-03-14: qty 2

## 2024-03-14 MED ORDER — LACTATED RINGERS IV SOLN: INTRAVENOUS | Status: DC

## 2024-03-14 MED ORDER — INSULIN ASPART 100 UNIT/ML IJ SOLN
0.0000 [IU] | Freq: Three times a day (TID) | INTRAMUSCULAR | Status: DC
  Administered 2024-03-16 (×2): 2 [IU] via SUBCUTANEOUS
  Administered 2024-03-16: 8 [IU] via SUBCUTANEOUS
  Administered 2024-03-17: 2 [IU] via SUBCUTANEOUS
  Administered 2024-03-17: 3 [IU] via SUBCUTANEOUS
  Administered 2024-03-18 (×2): 5 [IU] via SUBCUTANEOUS
  Administered 2024-03-19: 1 [IU] via SUBCUTANEOUS
  Administered 2024-03-19 – 2024-03-20 (×3): 3 [IU] via SUBCUTANEOUS
  Administered 2024-03-22: 2 [IU] via SUBCUTANEOUS
  Administered 2024-03-22 – 2024-03-23 (×2): 3 [IU] via SUBCUTANEOUS
  Administered 2024-03-23: 2 [IU] via SUBCUTANEOUS
  Filled 2024-03-14: qty 2
  Filled 2024-03-14: qty 8
  Filled 2024-03-14: qty 2
  Filled 2024-03-14: qty 3
  Filled 2024-03-14: qty 5
  Filled 2024-03-14: qty 3
  Filled 2024-03-14: qty 1
  Filled 2024-03-14: qty 2
  Filled 2024-03-14: qty 5
  Filled 2024-03-14 (×2): qty 3
  Filled 2024-03-14 (×3): qty 2
  Filled 2024-03-14: qty 3

## 2024-03-14 MED ORDER — IOHEXOL 350 MG/ML SOLN
100.0000 mL | Freq: Once | INTRAVENOUS | Status: AC | PRN
  Administered 2024-03-14: 100 mL via INTRAVENOUS

## 2024-03-14 MED ORDER — ACETAMINOPHEN 650 MG RE SUPP: 650.0000 mg | Freq: Four times a day (QID) | RECTAL | Status: DC | PRN

## 2024-03-14 MED ORDER — SODIUM CHLORIDE 0.9% FLUSH
3.0000 mL | Freq: Two times a day (BID) | INTRAVENOUS | Status: AC
  Administered 2024-03-14 – 2024-03-23 (×15): 3 mL via INTRAVENOUS

## 2024-03-14 MED ORDER — FUROSEMIDE 10 MG/ML IJ SOLN
60.0000 mg | Freq: Once | INTRAMUSCULAR | Status: DC
  Filled 2024-03-14: qty 6

## 2024-03-14 MED ORDER — OSELTAMIVIR PHOSPHATE 75 MG PO CAPS
75.0000 mg | ORAL_CAPSULE | Freq: Once | ORAL | Status: AC
  Administered 2024-03-14: 75 mg via ORAL
  Filled 2024-03-14: qty 1

## 2024-03-14 MED ORDER — ACETAMINOPHEN 500 MG PO TABS
1000.0000 mg | ORAL_TABLET | Freq: Once | ORAL | Status: AC
  Administered 2024-03-14: 1000 mg via ORAL
  Filled 2024-03-14: qty 2

## 2024-03-14 MED ORDER — FUROSEMIDE 10 MG/ML IJ SOLN
80.0000 mg | Freq: Once | INTRAMUSCULAR | Status: AC
  Administered 2024-03-14: 80 mg via INTRAVENOUS

## 2024-03-14 MED ORDER — SODIUM CHLORIDE 0.9 % IV SOLN: 1.0000 g | INTRAVENOUS | Status: DC

## 2024-03-14 MED ORDER — POLYETHYLENE GLYCOL 3350 17 G PO PACK: 17.0000 g | PACK | Freq: Every day | ORAL | Status: DC | PRN

## 2024-03-14 MED ORDER — EMPAGLIFLOZIN 10 MG PO TABS
10.0000 mg | ORAL_TABLET | Freq: Every day | ORAL | Status: DC
  Administered 2024-03-14 – 2024-03-23 (×10): 10 mg via ORAL
  Filled 2024-03-14 (×10): qty 1

## 2024-03-14 MED ORDER — SODIUM CHLORIDE 0.9 % IV SOLN
100.0000 mg | Freq: Once | INTRAVENOUS | Status: AC
  Administered 2024-03-14: 100 mg via INTRAVENOUS
  Filled 2024-03-14: qty 100

## 2024-03-14 NOTE — ED Notes (Signed)
 Patient resting comfortably on BiPAP. Stated she wants to continue to sleep, RT will try to wean later

## 2024-03-14 NOTE — Progress Notes (Signed)
 Plan of Care Note for accepted transfer   Patient: Christine Singleton MRN: [id number]   DOA: 03/14/2024  Facility requesting transfer: [facility].  Requesting Provider: Juliene Milch, DO. Reason for transfer: Acute respiratory failure with hypoxia/influenza A. Facility course:   33 year old female with a past medical history of class III obesity with a BMI of 58.89 kg/m, pulmonary embolism on Xarelto  who has been having progressively worse dyspnea for the past week who with arrival to the emergency department had hypoxia of 67% on room air.  O2 sat improved to 95% after being placed on nasal cannula oxygen at 6 LPM.  She has been having productive cough for the past 3 days.  Workup showing elevated BNP and positive influenza B PCR.  Lab work:SABRA  Comprehensive metabolic panel [[id number]] (Abnormal)   Collected: 03/14/24 0851   Updated: 03/14/24 1003   Specimen Type: Blood   Specimen Source: Vein    Sodium 134 Low  mmol/L   Potassium 5.1 mmol/L   Chloride 94 Low  mmol/L   CO2 32 mmol/L   Glucose, Bld 141 High  mg/dL   BUN 9 mg/dL   Creatinine, Ser 9.30 mg/dL   Calcium 9.0 mg/dL   Total Protein 7.4 g/dL   Albumin 3.8 g/dL   AST 27 U/L   ALT 12 U/L   Alkaline Phosphatase 127 High  U/L   Total Bilirubin 0.6 mg/dL   GFR, Estimated >39 mL/min   Anion gap 8  Pro Brain natriuretic peptide [[id number]] (Abnormal)   Collected: 03/14/24 0851   Updated: 03/14/24 1003   Specimen Type: Blood   Specimen Source: Vein    Pro Brain Natriuretic Peptide 2,173.0 High  pg/mL  Troponin T, High Sensitivity [[id number]]   Collected: 03/14/24 0851   Updated: 03/14/24 1003   Specimen Type: Blood    Troponin T High Sensitivity 16 ng/L  Resp panel by RT-PCR (RSV, Flu A&B, Covid) Anterior Nasal Swab [[id number]] (Abnormal)   Collected: 03/14/24 0852   Updated: 03/14/24 0954   Specimen Source: Anterior Nasal Swab    SARS Coronavirus 2 by RT PCR NEGATIVE   Influenza A by PCR NEGATIVE    Influenza B by PCR POSITIVE Abnormal    Resp Syncytial Virus by PCR NEGATIVE  CBC with Differential [[id number]] (Abnormal)   Collected: 03/14/24 0851   Updated: 03/14/24 0931   Specimen Type: Blood   Specimen Source: Vein    WBC 11.0 High  K/uL   RBC 5.84 High  MIL/uL   Hemoglobin 12.9 g/dL   HCT 58.7 %   MCV 29.4 Low  fL   MCH 22.1 Low  pg   MCHC 31.3 g/dL   RDW 79.8 High  %   Platelets 369 K/uL   nRBC 0.4 High  %   Neutrophils Relative % 76 %   Neutro Abs 8.6 High  K/uL   Lymphocytes Relative 12 %   Lymphs Abs 1.3 K/uL   Monocytes Relative 9 %   Monocytes Absolute 1.0 K/uL   Eosinophils Relative 1 %   Eosinophils Absolute 0.1 K/uL   Basophils Relative 1 %   Basophils Absolute 0.1 K/uL   Immature Granulocytes 1 %   Abs Immature Granulocytes 0.05 K/uL   Imaging:  1 VIEW(S) XRAY OF THE CHEST 03/14/2024 09:08:00 AM   COMPARISON: 01/02/2024   CLINICAL HISTORY: sob   FINDINGS:   LUNGS AND PLEURA: Diffuse prominence of the parahilar interstitial markings, most prominent in the right lower lobe, favor mild pulmonary edema.  No pleural effusion. No pneumothorax.   HEART AND MEDIASTINUM: Cardiomegaly.   BONES AND SOFT TISSUES: No acute osseous abnormality.   IMPRESSION: 1. Cardiomegaly. 2. Diffuse prominence of the parahilar interstitial markings, most prominent in the right lower lobe, favoring mild pulmonary edema.  11/19/2023 transthoracic echocardiogram report. IMPRESSIONS:   1. Left ventricular ejection fraction, by estimation, is 60 to 65%. The  left ventricle has normal function. The left ventricle has no regional  wall motion abnormalities. There is mild concentric left ventricular  hypertrophy. Indeterminate diastolic  filling due to E-A fusion.   2. Right ventricular systolic function is moderately reduced. The right  ventricular size is moderately enlarged. Tricuspid regurgitation signal is  inadequate for assessing PA pressure.   3. The mitral  valve is normal in structure. No evidence of mitral valve  regurgitation. No evidence of mitral stenosis.   4. The aortic valve is tricuspid. There is mild calcification of the  aortic valve. Aortic valve regurgitation is not visualized. Aortic valve  sclerosis/calcification is present, without any evidence of aortic  stenosis.   5. The inferior vena cava is normal in size with greater than 50%  respiratory variability, suggesting right atrial pressure of 3 mmHg.   Plan of care: The patient is accepted for admission to Progressive unit, at [facility].  In addition to supplemental oxygen she received acetaminophen  1000 mg p.o. x 1, ceftriaxone  2 g IVPB, doxycycline  100 mg IVPB, furosemide  80 mg IVP x 1 and oseltamivir  75 mg p.o. x 1 dose.  Author: Alm Dorn Castor, MD 03/14/2024  Check [email or web address] for on-call coverage.  Nursing staff, Please call [facility] Admits & Consults System-Wide number on Amion as soon as patient's arrival, so appropriate admitting provider can evaluate the pt.

## 2024-03-14 NOTE — Progress Notes (Signed)
" °   03/14/24 2206  BiPAP/CPAP/SIPAP  BiPAP/CPAP/SIPAP Pt Type Adult  BiPAP/CPAP/SIPAP SERVO (air)  Mask Type Full face mask  Dentures removed? Not applicable  Mask Size Large  Set Rate 12 breaths/min  Respiratory Rate 20 breaths/min  IPAP 22 cmH20  EPAP 8 cmH2O  PEEP 8 cmH20  FiO2 (%) 40 %  Minute Ventilation 7.3  Leak 20  Peak Inspiratory Pressure (PIP) 24  Tidal Volume (Vt) 618  Patient Home Machine No  Patient Home Mask No  Patient Home Tubing No  Auto Titrate No  Press High Alarm 30 cmH2O  Device Plugged into RED Power Outlet Yes    "

## 2024-03-14 NOTE — ED Notes (Signed)
 Came in for SOB for several days, been sick for 1x week or so. Pt lying in bed upon assessment, accessory muscle use, morbidly obese with obstructive difficulty. Uses CPAP at home, compliant with same. Feels hot to the touch.

## 2024-03-14 NOTE — ED Notes (Signed)
 Pt is alert to verbal stimulus but cannot follow commands without falling back asleep. She will make eye contact and answer a few questions before closing her eyes again and nodding off.   Informed RT.

## 2024-03-14 NOTE — ED Notes (Signed)
 ABG obtained prior to BiPAP and run on istat. I have attempted to upload results multiple times with no luck. MD was given results as soon at I ran them.  Results as follows: Ph: 7.357 PCO2: 66.9 (panic given to Juliene Bicker, MD at 10:23) PO2: 71 TCO2: 40 HCO3: 37.6 BE: 9 SAT 92%  Will attempt to contact POC and upload

## 2024-03-14 NOTE — ED Triage Notes (Signed)
 Pt SOB x 1 week. SaTs 67% on room air upon arrival . Color dusky. Pt roomed and EDP at bedside. O2 initiated on 6 L [place] with 95 %. Productive cough x 3 days. On Xarelto  fro HX of PE. Exposed to flu . Swollen. Reports eating increase amount of ice

## 2024-03-14 NOTE — ED Notes (Signed)
 Patient dozing off and SAT dropped. Hx of OSA. Placed back on BiPAP

## 2024-03-14 NOTE — ED Notes (Signed)
 Pt advised she did not use her CPAP last evening.

## 2024-03-14 NOTE — ED Notes (Signed)
 Delay due to late orders for Sepsis activation and no additional IV access. Pt has poor PIV access and did not want to start antbx before pulling the cultures I needed.

## 2024-03-14 NOTE — H&P (Addendum)
 " History and Physical    Christine Singleton:[id number] DOB: [birth year] DOA: 03/14/2024  PCP: Christine Rosaline SQUIBB, NP  Patient coming from: Home  Chief Complaint: SOB   HPI: Christine Singleton is a 33 y.o. female with medical history significant of PE on Xarelto , hypertension, diabetes, morbid obesity, cardiomyopathy who presents with shortness of breath.  She states that she has not been feeling well for about a week.  She has been wearing her oxygen at home, CPAP that she uses at nighttime.  Denies any fevers but has had some night sweats.  Admits to chest pain as well as productive cough of brown sputum.  No nausea or vomiting.  Also feels like she has some swelling.  ED Course: Found to be saturating 60% on room air when she arrived to the ED.  She was put on high flow nasal cannula O2 and ultimately BiPAP.  She tested positive for influenza B.  CTA chest showed multifocal pneumonia.  Review of Systems: As per HPI. Otherwise, all other review of systems reviewed and are negative.   Past Medical History:  Diagnosis Date   CHF (congestive heart failure) (HCC)    Gestational diabetes    With first pregnancy   History of cesarean delivery 04/23/2016   Hypertension    PE (pulmonary thromboembolism) (HCC)    Sleep apnea     Past Surgical History:  Procedure Laterality Date   CESAREAN SECTION     CESAREAN SECTION N/A 02/03/2016   Procedure: CESAREAN SECTION;  Surgeon: Burnard VEAR Pate, MD;  Location: [facility];  Service: Obstetrics;  Laterality: N/A;     reports that she has never smoked. She has never used smokeless tobacco. She reports current drug use. Drug: Marijuana. She reports that she does not drink alcohol.  Allergies[1]  Family History  Problem Relation Age of Onset   Migraines Mother    Hypertension Mother    Hyperlipidemia Mother    Hypertension Father    Hyperlipidemia Father    Asthma Sister    Cancer Neg Hx    Diabetes Neg Hx     Prior to Admission  medications  Medication Sig Start Date End Date Taking? Authorizing Provider  empagliflozin  (JARDIANCE ) 10 MG TABS tablet Take 1 tablet (10 mg total) by mouth daily. 01/05/24  Yes Arrien, Elidia Sieving, MD  losartan  (COZAAR ) 25 MG tablet Take 1 tablet (25 mg total) by mouth daily. 02/16/24  Yes Hayes Beckey CROME, NP  metFORMIN  (GLUCOPHAGE ) 500 MG tablet Take 1 tablet (500 mg total) by mouth daily with breakfast. 01/05/24  Yes Arrien, Elidia Sieving, MD  OXYGEN Inhale 3 L/min into the lungs at bedtime.   Yes [provider]  rivaroxaban  (XARELTO ) 20 MG TABS tablet Take 1 tablet (20 mg total) by mouth daily. 10/21/23  Yes Zenaida Morene PARAS, MD  spironolactone  (ALDACTONE ) 25 MG tablet Take 1 tablet (25 mg total) by mouth daily. 01/05/24 02/15/25 Yes Arrien, Elidia Sieving, MD  torsemide  (DEMADEX ) 20 MG tablet Take 1 tablet (20 mg total) by mouth daily. 01/05/24  Yes Arrien, Elidia Sieving, MD  TYLENOL  500 MG tablet Take 1 tablet (500 mg total) by mouth every 6 (six) hours as needed (for headaches). 11/20/23  Yes Arrien, Elidia Sieving, MD    Physical Exam: Vitals:   03/14/24 1500 03/14/24 1515 03/14/24 1530 03/14/24 1646  BP: (!) 130/97 (!) 128/96 (!) 122/101 120/74  Pulse: 100 (!) 102 (!) 101 96  Resp: 13 13 15 17   Temp:  TempSrc:      SpO2: 95% 97% 93% 100%  Weight:        Constitutional: NAD, calm, comfortable Eyes: PERRL, lids and conjunctivae normal Respiratory: Clear to auscultation anteriorly, diminished breath sounds, no wheezing or crackles.  Appears comfortable on BiPAP Cardiovascular: Regular rate and rhythm, no murmurs. No appreciable extremity edema.  Abdomen: Soft, nondistended, nontender to palpation. Bowel sounds positive.  Musculoskeletal: No joint deformity upper and lower extremities. No contractures. Normal muscle tone.  Skin: no rashes, lesions, ulcers on exposed skin  Neurologic: Alert and oriented, speech fluent, CN 2-12 grossly intact. No focal deficits.    Psychiatric: Normal judgment and insight. Normal mood and affect   Labs on Admission: I have personally reviewed following labs and imaging studies  CBC: Recent Labs  Lab 03/14/24 0851 03/14/24 1022 03/14/24 1449  WBC 11.0*  --   --   NEUTROABS 8.6*  --   --   HGB 12.9 15.0 15.3*  HCT 41.2 44.0 45.0  MCV 70.5*  --   --   PLT 369  --   --    Basic Metabolic Panel: Recent Labs  Lab 03/14/24 0851 03/14/24 1022 03/14/24 1449  NA 134* 133* 135  K 5.1 4.8 4.3  CL 94*  --   --   CO2 32  --   --   GLUCOSE 141*  --   --   BUN 9  --   --   CREATININE 0.69  --   --   CALCIUM 9.0  --   --    GFR: Estimated Creatinine Clearance: 142.4 mL/min (by C-G formula based on SCr of 0.69 mg/dL). Liver Function Tests: Recent Labs  Lab 03/14/24 0851  AST 27  ALT 12  ALKPHOS 127*  BILITOT 0.6  PROT 7.4  ALBUMIN 3.8   No results for input(s): LIPASE, AMYLASE in the last 168 hours. No results for input(s): AMMONIA in the last 168 hours. Coagulation Profile: No results for input(s): INR, PROTIME in the last 168 hours. Cardiac Enzymes: No results for input(s): CKTOTAL, CKMB, CKMBINDEX, TROPONINI in the last 168 hours. BNP (last 3 results) Recent Labs    11/18/23 1612 01/02/24 2152 03/14/24 0851  PROBNP 193.0 1,588.0* 2,173.0*   HbA1C: No results for input(s): HGBA1C in the last 72 hours. CBG: Recent Labs  Lab 03/14/24 1511  GLUCAP 106*   Lipid Profile: No results for input(s): CHOL, HDL, LDLCALC, TRIG, CHOLHDL, LDLDIRECT in the last 72 hours. Thyroid Function Tests: No results for input(s): TSH, T4TOTAL, FREET4, T3FREE, THYROIDAB in the last 72 hours. Anemia Panel: No results for input(s): VITAMINB12, FOLATE, FERRITIN, TIBC, IRON, RETICCTPCT in the last 72 hours. Urine analysis:    Component Value Date/Time   COLORURINE YELLOW 03/14/2024 1016   APPEARANCEUR CLEAR 03/14/2024 1016   LABSPEC 1.015 03/14/2024 1016    PHURINE 7.0 03/14/2024 1016   GLUCOSEU 100 (A) 03/14/2024 1016   HGBUR TRACE (A) 03/14/2024 1016   BILIRUBINUR NEGATIVE 03/14/2024 1016   KETONESUR NEGATIVE 03/14/2024 1016   PROTEINUR NEGATIVE 03/14/2024 1016   UROBILINOGEN 1.0 11/27/2015 1450   NITRITE NEGATIVE 03/14/2024 1016   LEUKOCYTESUR NEGATIVE 03/14/2024 1016   Sepsis Labs: !!!!!!!!!!!!!!!!!!!!!!!!!!!!!!!!!!!!!!!!!!!! @LABRCNTIP (procalcitonin:4,lacticidven:4) ) Recent Results (from the past 240 hours)  Resp panel by RT-PCR (RSV, Flu A&B, Covid) Anterior Nasal Swab     Status: Abnormal   Collection Time: 03/14/24  8:52 AM   Specimen: Anterior Nasal Swab  Result Value Ref Range Status   SARS Coronavirus 2 by  RT PCR NEGATIVE NEGATIVE Final    Comment: (NOTE) SARS-CoV-2 target nucleic acids are NOT DETECTED.  The SARS-CoV-2 RNA is generally detectable in upper respiratory specimens during the acute phase of infection. The lowest concentration of SARS-CoV-2 viral copies this assay can detect is 138 copies/mL. A negative result does not preclude SARS-Cov-2 infection and should not be used as the sole basis for treatment or other patient management decisions. A negative result may occur with  improper specimen collection/handling, submission of specimen other than nasopharyngeal swab, presence of viral mutation(s) within the areas targeted by this assay, and inadequate number of viral copies(<138 copies/mL). A negative result must be combined with clinical observations, patient history, and epidemiological information. The expected result is Negative.  Fact Sheet for Patients:  bloggercourse.com  Fact Sheet for Healthcare Providers:  seriousbroker.it  This test is no t yet approved or cleared by the [place]  FDA and  has been authorized for detection and/or diagnosis of SARS-CoV-2 by FDA under an Emergency Use Authorization (EUA). This EUA will remain  in effect  (meaning this test can be used) for the duration of the COVID-19 declaration under Section 564(b)(1) of the Act, 21 U.S.C.section 360bbb-3(b)(1), unless the authorization is terminated  or revoked sooner.       Influenza A by PCR NEGATIVE NEGATIVE Final   Influenza B by PCR POSITIVE (A) NEGATIVE Final    Comment: (NOTE) The Xpert Xpress SARS-CoV-2/FLU/RSV plus assay is intended as an aid in the diagnosis of influenza from Nasopharyngeal swab specimens and should not be used as a sole basis for treatment. Nasal washings and aspirates are unacceptable for Xpert Xpress SARS-CoV-2/FLU/RSV testing.  Fact Sheet for Patients: bloggercourse.com  Fact Sheet for Healthcare Providers: seriousbroker.it  This test is not yet approved or cleared by the [place]  FDA and has been authorized for detection and/or diagnosis of SARS-CoV-2 by FDA under an Emergency Use Authorization (EUA). This EUA will remain in effect (meaning this test can be used) for the duration of the COVID-19 declaration under Section 564(b)(1) of the Act, 21 U.S.C. section 360bbb-3(b)(1), unless the authorization is terminated or revoked.     Resp Syncytial Virus by PCR NEGATIVE NEGATIVE Final    Comment: (NOTE) Fact Sheet for Patients: bloggercourse.com  Fact Sheet for Healthcare Providers: seriousbroker.it  This test is not yet approved or cleared by the [place]  FDA and has been authorized for detection and/or diagnosis of SARS-CoV-2 by FDA under an Emergency Use Authorization (EUA). This EUA will remain in effect (meaning this test can be used) for the duration of the COVID-19 declaration under Section 564(b)(1) of the Act, 21 U.S.C. section 360bbb-3(b)(1), unless the authorization is terminated or revoked.  Performed at [facility], [address] [place], [place] [address]       Radiological Exams on Admission: CT Angio Chest PE W and/or Wo Contrast Result Date: 03/14/2024 EXAM: CTA CHEST 03/14/2024 12:21:12 PM TECHNIQUE: CTA of the chest was performed without and with the administration of 100 mL of iohexol  (OMNIPAQUE ) 350 MG/ML injection. Multiplanar reformatted images are provided for review. MIP images are provided for review. Automated exposure control, iterative reconstruction, and/or weight based adjustment of the mA/kV was utilized to reduce the radiation dose to as low as reasonably achievable. COMPARISON: None available. CLINICAL HISTORY: Pulmonary embolism (PE) suspected, high prob. FINDINGS: PULMONARY ARTERIES: Pulmonary arteries are adequately opacified for evaluation. No acute pulmonary embolus. Main pulmonary artery is normal in caliber. MEDIASTINUM: Cardiomegaly. There is no  acute abnormality of the thoracic aorta. LYMPH NODES: No mediastinal, hilar or axillary lymphadenopathy. LUNGS AND PLEURA: Consolidation in the left upper lobe. Patchy airspace disease in the right middle lobe and both lower lobes. Findings concerning for multifocal pneumonia. No evidence of pleural effusion or pneumothorax. UPPER ABDOMEN: Limited images of the upper abdomen are unremarkable. SOFT TISSUES AND BONES: No acute bone or soft tissue abnormality. IMPRESSION: 1. No pulmonary embolism. 2. Patchy bilateral airspace disease most concerning for multifocal pneumonia. 3. Cardiomegaly. Electronically signed by: Franky Crease MD 03/14/2024 01:56 PM EST RP Workstation: [id number]   DG Chest Portable 1 View Result Date: 03/14/2024 EXAM: 1 VIEW(S) XRAY OF THE CHEST 03/14/2024 09:08:00 AM COMPARISON: 01/02/2024 CLINICAL HISTORY: sob FINDINGS: LUNGS AND PLEURA: Diffuse prominence of the parahilar interstitial markings, most prominent in the right lower lobe, favor mild pulmonary edema. No pleural effusion. No pneumothorax. HEART AND MEDIASTINUM: Cardiomegaly. BONES AND SOFT TISSUES: No acute  osseous abnormality. IMPRESSION: 1. Cardiomegaly. 2. Diffuse prominence of the parahilar interstitial markings, most prominent in the right lower lobe, favoring mild pulmonary edema. Electronically signed by: Selinda Blue MD 03/14/2024 10:38 AM EST RP Workstation: [id number]    EKG: Independently reviewed.  Sinus tachycardia, rate 118  Assessment/Plan Principal Problem:   Acute respiratory failure with hypoxia and hypercapnia (HCC) Active Problems:   Acute on chronic diastolic CHF (congestive heart failure) (HCC)   Essential hypertension   History of pulmonary embolism   Non-insulin  dependent type 2 diabetes mellitus (HCC)   OSA (obstructive sleep apnea)   Class 3 obesity (HCC)   Acute hypoxemic and hypercapnic respiratory failure - She presented with hypoxia, SpO2 68% on room air.  ABG also showing hypercapnia - She was placed on BiPAP at time of admission, wean as able - She has 3 L nasal cannula O2 at home as well as CPAP  Severe sepsis secondary to multifocal pneumonia - Sepsis present on admission with tachycardia, tachypnea, respiratory failure - Positive for influenza B - CTA chest showed patchy bilateral airspace disease, concerning for multifocal pneumonia - Blood cultures pending - Rocephin , doxycycline , Tamiflu   Nonischemic cardiomyopathy Chronic diastolic CHF - Echocardiogram in September 2025 revealed EF 60 to 65% - proBNP 2173 - Does not appear to be significantly volume overloaded on exam. Did receive IV lasix  in ED. Continue Jardiance , Demadex , Aldactone   Hypertension - Cozaar , Demadex , Aldactone   History of PE - Xarelto   Diabetes mellitus - Hold home metformin  - Sliding scale insulin   Morbid obesity - BMI 58.89  OSA/OHS - CPAP nightly   DVT prophylaxis: Xarelto   rivaroxaban  (XARELTO ) tablet 20 mg  Code Status: Full code Family Communication: None  Disposition Plan: Home Consults called: None  Severity of Illness: The appropriate patient  status for this patient is INPATIENT. Inpatient status is judged to be reasonable and necessary in order to provide the required intensity of service to ensure the patient's safety. The patient's presenting symptoms, physical exam findings, and initial radiographic and laboratory data in the context of their chronic comorbidities is felt to place them at high risk for further clinical deterioration. Furthermore, it is not anticipated that the patient will be medically stable for discharge from the hospital within 2 midnights of admission.   * I certify that at the point of admission it is my clinical judgment that the patient will require inpatient hospital care spanning beyond 2 midnights from the point of admission due to high intensity of service, high risk for further deterioration and high frequency of surveillance required.*  Delon Hoe, DO [facility] 03/14/2024, 5:04 PM   Available via Epic secure chat 7am-7pm After these hours, please refer to coverage provider listed on [email or web address]      [1]  Allergies Allergen Reactions   Strawberry Extract Anaphylaxis   "

## 2024-03-14 NOTE — ED Provider Notes (Addendum)
 " [facility] Provider Note   CSN: [id number] Arrival date & time: 03/14/24  9155     Patient presents with: Shortness of Breath   Christine Singleton is a 33 y.o. female.   Patient arrives shortness of breath.  Was 60% on room air when she arrived in triage.  She has possibly fluid exposure history of PE on Xarelto  history of cardiomyopathy.  She feels swollen.  She denies any black or bloody stools.  She denies any nausea vomit diarrhea.  She does wear oxygen intermittently at home but is not supposed to regularly be on it.  She has been compliant with his Xarelto .  She had a lot of shortness of breath with exertion.  The history is provided by the patient.       Prior to Admission medications  Medication Sig Start Date End Date Taking? Authorizing Provider  empagliflozin  (JARDIANCE ) 10 MG TABS tablet Take 1 tablet (10 mg total) by mouth daily. 01/05/24  Yes Arrien, Elidia Sieving, MD  losartan  (COZAAR ) 25 MG tablet Take 1 tablet (25 mg total) by mouth daily. 02/16/24  Yes Hayes Beckey CROME, NP  metFORMIN  (GLUCOPHAGE ) 500 MG tablet Take 1 tablet (500 mg total) by mouth daily with breakfast. 01/05/24  Yes Arrien, Elidia Sieving, MD  OXYGEN Inhale 3 L/min into the lungs at bedtime.   Yes [provider]  rivaroxaban  (XARELTO ) 20 MG TABS tablet Take 1 tablet (20 mg total) by mouth daily. 10/21/23  Yes Zenaida Morene PARAS, MD  spironolactone  (ALDACTONE ) 25 MG tablet Take 1 tablet (25 mg total) by mouth daily. 01/05/24 02/15/25 Yes Arrien, Elidia Sieving, MD  torsemide  (DEMADEX ) 20 MG tablet Take 1 tablet (20 mg total) by mouth daily. 01/05/24  Yes Arrien, Elidia Sieving, MD  TYLENOL  500 MG tablet Take 1 tablet (500 mg total) by mouth every 6 (six) hours as needed (for headaches). 11/20/23  Yes Arrien, Elidia Sieving, MD    Allergies: Strawberry extract    Review of Systems  Updated Vital Signs BP (!) 133/94   Pulse (!) 101   Temp (!)  97.4 F (36.3 C) (Oral)   Resp 16   Wt (!) 146.1 kg   SpO2 95%   BMI 58.89 kg/m   Physical Exam Vitals and nursing note reviewed.  Constitutional:      General: She is not in acute distress.    Appearance: She is well-developed. She is not ill-appearing.  HENT:     Head: Normocephalic and atraumatic.     Mouth/Throat:     Mouth: Mucous membranes are moist.  Eyes:     Conjunctiva/sclera: Conjunctivae normal.     Pupils: Pupils are equal, round, and reactive to light.  Cardiovascular:     Rate and Rhythm: Regular rhythm. Tachycardia present.     Pulses: Normal pulses.     Heart sounds: No murmur heard. Pulmonary:     Effort: Tachypnea present. No respiratory distress.     Breath sounds: Decreased breath sounds present.  Abdominal:     Palpations: Abdomen is soft.     Tenderness: There is no abdominal tenderness.  Musculoskeletal:        General: No swelling.     Cervical back: Normal range of motion and neck supple.     Right lower leg: Edema present.     Left lower leg: Edema present.  Skin:    General: Skin is warm and dry.     Capillary Refill: Capillary  refill takes less than 2 seconds.  Neurological:     Mental Status: She is alert.  Psychiatric:        Mood and Affect: Mood normal.     (all labs ordered are listed, but only abnormal results are displayed) Labs Reviewed  RESP PANEL BY RT-PCR (RSV, FLU A&B, COVID)  RVPGX2 - Abnormal; Notable for the following components:      Result Value   Influenza B by PCR POSITIVE (*)    All other components within normal limits  CBC WITH DIFFERENTIAL/PLATELET - Abnormal; Notable for the following components:   WBC 11.0 (*)    RBC 5.84 (*)    MCV 70.5 (*)    MCH 22.1 (*)    RDW 20.1 (*)    nRBC 0.4 (*)    Neutro Abs 8.6 (*)    All other components within normal limits  COMPREHENSIVE METABOLIC PANEL WITH GFR - Abnormal; Notable for the following components:   Sodium 134 (*)    Chloride 94 (*)    Glucose, Bld 141 (*)     Alkaline Phosphatase 127 (*)    All other components within normal limits  PRO BRAIN NATRIURETIC PEPTIDE - Abnormal; Notable for the following components:   Pro Brain Natriuretic Peptide 2,173.0 (*)    All other components within normal limits  URINALYSIS, ROUTINE W REFLEX MICROSCOPIC - Abnormal; Notable for the following components:   Glucose, UA 100 (*)    Hgb urine dipstick TRACE (*)    All other components within normal limits  URINALYSIS, MICROSCOPIC (REFLEX) - Abnormal; Notable for the following components:   Bacteria, UA FEW (*)    All other components within normal limits  CULTURE, BLOOD (ROUTINE X 2)  CULTURE, BLOOD (ROUTINE X 2)  LACTIC ACID, PLASMA  HCG, SERUM, QUALITATIVE  I-STAT ARTERIAL BLOOD GAS, ED  TROPONIN T, HIGH SENSITIVITY    EKG: EKG Interpretation Date/Time:  Monday March 14 2024 08:59:10 EST Ventricular Rate:  118 PR Interval:  122 QRS Duration:  91 QT Interval:  474 QTC Calculation: 665 R Axis:   109  Text Interpretation: Sinus tachycardia Confirmed by Ruthe Cornet [id number]) on 03/14/2024 9:42:10 AM  Radiology: CT Angio Chest PE W and/or Wo Contrast Result Date: 03/14/2024 EXAM: CTA CHEST 03/14/2024 12:21:12 PM TECHNIQUE: CTA of the chest was performed without and with the administration of 100 mL of iohexol  (OMNIPAQUE ) 350 MG/ML injection. Multiplanar reformatted images are provided for review. MIP images are provided for review. Automated exposure control, iterative reconstruction, and/or weight based adjustment of the mA/kV was utilized to reduce the radiation dose to as low as reasonably achievable. COMPARISON: None available. CLINICAL HISTORY: Pulmonary embolism (PE) suspected, high prob. FINDINGS: PULMONARY ARTERIES: Pulmonary arteries are adequately opacified for evaluation. No acute pulmonary embolus. Main pulmonary artery is normal in caliber. MEDIASTINUM: Cardiomegaly. There is no acute abnormality of the thoracic aorta. LYMPH NODES: No  mediastinal, hilar or axillary lymphadenopathy. LUNGS AND PLEURA: Consolidation in the left upper lobe. Patchy airspace disease in the right middle lobe and both lower lobes. Findings concerning for multifocal pneumonia. No evidence of pleural effusion or pneumothorax. UPPER ABDOMEN: Limited images of the upper abdomen are unremarkable. SOFT TISSUES AND BONES: No acute bone or soft tissue abnormality. IMPRESSION: 1. No pulmonary embolism. 2. Patchy bilateral airspace disease most concerning for multifocal pneumonia. 3. Cardiomegaly. Electronically signed by: Franky Crease MD 03/14/2024 01:56 PM EST RP Workstation: [id number]   DG Chest Portable 1 View Result Date: 03/14/2024 EXAM: 1  VIEW(S) XRAY OF THE CHEST 03/14/2024 09:08:00 AM COMPARISON: 01/02/2024 CLINICAL HISTORY: sob FINDINGS: LUNGS AND PLEURA: Diffuse prominence of the parahilar interstitial markings, most prominent in the right lower lobe, favor mild pulmonary edema. No pleural effusion. No pneumothorax. HEART AND MEDIASTINUM: Cardiomegaly. BONES AND SOFT TISSUES: No acute osseous abnormality. IMPRESSION: 1. Cardiomegaly. 2. Diffuse prominence of the parahilar interstitial markings, most prominent in the right lower lobe, favoring mild pulmonary edema. Electronically signed by: Selinda Blue MD 03/14/2024 10:38 AM EST RP Workstation: [id number]     .Critical Care  Performed by: Ruthe Cornet, DO Authorized by: Ruthe Cornet, DO   Critical care provider statement:    Critical care time (minutes):  30   Critical care was time spent personally by me on the following activities:  Development of treatment plan with patient or surrogate, discussions with consultants, evaluation of patient's response to treatment, examination of patient, ordering and review of laboratory studies, ordering and review of radiographic studies, ordering and performing treatments and interventions, pulse oximetry, re-evaluation of patient's condition and review of old  charts   Care discussed with: admitting provider      Medications Ordered in the ED  lactated ringers  infusion ( Intravenous New Bag/Given 03/14/24 1113)  ipratropium-albuterol  (DUONEB) 0.5-2.5 (3) MG/3ML nebulizer solution (3 mLs  Given by Other 03/14/24 0908)  oseltamivir  (TAMIFLU ) capsule 75 mg (75 mg Oral Given 03/14/24 1027)  acetaminophen  (TYLENOL ) tablet 1,000 mg (1,000 mg Oral Given 03/14/24 1027)  cefTRIAXone  (ROCEPHIN ) 2 g in sodium chloride  0.9 % 100 mL IVPB (0 g Intravenous Stopped 03/14/24 1141)  doxycycline  (VIBRAMYCIN ) 100 mg in sodium chloride  0.9 % 250 mL IVPB (0 mg Intravenous Stopped 03/14/24 1347)  furosemide  (LASIX ) injection 80 mg (80 mg Intravenous Given 03/14/24 1115)  iohexol  (OMNIPAQUE ) 350 MG/ML injection 100 mL (100 mLs Intravenous Contrast Given 03/14/24 1206)                                    Medical Decision Making Amount and/or Complexity of Data Reviewed Labs: ordered. Radiology: ordered.  Risk OTC drugs. Prescription drug management. Decision regarding hospitalization.   Palmina Stahl is here with shortness of breath.  History of cardiomyopathy on torsemide .  Hypoxic to 60% on room air upon arrival here.  Put on 10 L high flow and then eventually on BiPAP.  Tachycardic increased respiratory rate.  pH after high flow was 7.35, CO2 was 65.  Will put her on BiPAP for more supportive care but lab work thus far positive for influenza B.  Some edema on the chest x-ray versus possible pneumonia.  She has a fever here 100.  Sepsis workup was initiated once fever was found with concerning features for possible postviral pneumonia.  Her BNP is up to 2000.  Her troponins 100.  I talked with the ICU team on the phone and ultimately will talk with the hospitalist for stepdown bed.  Right now she stabilized and on BiPAP.  We can escalate the care if needed but we will give a dose of IV Lasix  given IV antibiotics and supportive care on BiPAP for what I suspect is  influenza B with possible pneumonia/volume overload and respiratory failure from this.  Tamiflu  has been given as well.  Kidney functions normal.  Electrolytes are unremarkable.  To be admitted to stepdown.  Will recheck a blood gas.  Patient toleratinfg BiPAP well.  Remains hemodynamically stable but we will continue  to monitor while patient is here.  Will get a CT scan to further evaluate for infectious process.  She is on anticoagulation and doubt PE.  2:53 PM PE scan were concerning for multifocal pneumonia.  Patient wanted a break from BiPAP so we gave her one and she did well.  But she does wear CPAP/BiPAP at night when she sleeps from obstructive sleep apnea.  She started to have desaturations while she was sleeping so we put her back on BiPAP.  Repeat blood gas shows pH is 7.315 CO2 80.  Will make some adjustments to the BiPAP machine.  She is mentating well otherwise.  Patient still pending bed at [facility].  Oncoming team aware of the situation with her.  This chart was dictated using voice recognition software.  Despite best efforts to proofread,  errors can occur which can change the documentation meaning.      Final diagnoses:  Influenza B  Acute respiratory failure with hypoxia [facility])    ED Discharge Orders     None          Ruthe Cornet, DO 03/14/24 1136    Ruthe Cornet, DO 03/14/24 1453  "

## 2024-03-14 NOTE — Plan of Care (Signed)

## 2024-03-14 NOTE — ED Notes (Signed)
 CO2 panic value given to Juliene Bicker, MD at 1455. Increased BiPAP settings to 22/8 with a BUR of 12 and 40%. VT increased to 624 with change to help blow off CO2.

## 2024-03-14 NOTE — ED Notes (Signed)
 Patient transported to CT on BiPAP without any issues. She did very well laying flat. When we arrived back to room, patient requested to come on BiPAP for a break. Placed back on 8L HFNC. Seems to be doing well, no distress noted. MD aware

## 2024-03-14 NOTE — ED Notes (Signed)
 Patient dropped SAT after laying flat to move in bed. FIO2 increased to 50%

## 2024-03-14 NOTE — Progress Notes (Signed)
 Elink is following code sepsis.

## 2024-03-15 DIAGNOSIS — J188 Other pneumonia, unspecified organism: Secondary | ICD-10-CM | POA: Diagnosis not present

## 2024-03-15 DIAGNOSIS — G9341 Metabolic encephalopathy: Secondary | ICD-10-CM | POA: Diagnosis not present

## 2024-03-15 DIAGNOSIS — J9621 Acute and chronic respiratory failure with hypoxia: Secondary | ICD-10-CM | POA: Diagnosis not present

## 2024-03-15 DIAGNOSIS — G4733 Obstructive sleep apnea (adult) (pediatric): Secondary | ICD-10-CM

## 2024-03-15 DIAGNOSIS — Z86711 Personal history of pulmonary embolism: Secondary | ICD-10-CM | POA: Diagnosis not present

## 2024-03-15 DIAGNOSIS — E119 Type 2 diabetes mellitus without complications: Secondary | ICD-10-CM | POA: Diagnosis not present

## 2024-03-15 DIAGNOSIS — J1008 Influenza due to other identified influenza virus with other specified pneumonia: Secondary | ICD-10-CM | POA: Diagnosis not present

## 2024-03-15 DIAGNOSIS — I5032 Chronic diastolic (congestive) heart failure: Secondary | ICD-10-CM | POA: Diagnosis not present

## 2024-03-15 DIAGNOSIS — J9601 Acute respiratory failure with hypoxia: Secondary | ICD-10-CM | POA: Diagnosis not present

## 2024-03-15 DIAGNOSIS — J9622 Acute and chronic respiratory failure with hypercapnia: Secondary | ICD-10-CM | POA: Diagnosis not present

## 2024-03-15 DIAGNOSIS — J09X1 Influenza due to identified novel influenza A virus with pneumonia: Secondary | ICD-10-CM | POA: Diagnosis not present

## 2024-03-15 DIAGNOSIS — I110 Hypertensive heart disease with heart failure: Secondary | ICD-10-CM | POA: Diagnosis not present

## 2024-03-15 DIAGNOSIS — E66813 Obesity, class 3: Secondary | ICD-10-CM

## 2024-03-15 DIAGNOSIS — J9602 Acute respiratory failure with hypercapnia: Secondary | ICD-10-CM | POA: Diagnosis not present

## 2024-03-15 LAB — CBC
HCT: 43.4 % (ref 36.0–46.0)
Hemoglobin: 13.3 g/dL (ref 12.0–15.0)
MCH: 22.1 pg — ABNORMAL LOW (ref 26.0–34.0)
MCHC: 30.6 g/dL (ref 30.0–36.0)
MCV: 72 fL — ABNORMAL LOW (ref 80.0–100.0)
Platelets: 365 K/uL (ref 150–400)
RBC: 6.03 MIL/uL — ABNORMAL HIGH (ref 3.87–5.11)
RDW: 20.4 % — ABNORMAL HIGH (ref 11.5–15.5)
WBC: 9.2 K/uL (ref 4.0–10.5)
nRBC: 0 % (ref 0.0–0.2)

## 2024-03-15 LAB — BLOOD GAS, ARTERIAL
Acid-Base Excess: 15.1 mmol/L — ABNORMAL HIGH (ref 0.0–2.0)
Bicarbonate: 45.9 mmol/L — ABNORMAL HIGH (ref 20.0–28.0)
Drawn by: 27027
O2 Content: 10 L/min
O2 Saturation: 99 %
Patient temperature: 37
pCO2 arterial: 89 mmHg (ref 32–48)
pH, Arterial: 7.32 — ABNORMAL LOW (ref 7.35–7.45)
pO2, Arterial: 109 mmHg — ABNORMAL HIGH (ref 83–108)

## 2024-03-15 LAB — BASIC METABOLIC PANEL WITH GFR
Anion gap: 6 (ref 5–15)
BUN: 9 mg/dL (ref 6–20)
CO2: 39 mmol/L — ABNORMAL HIGH (ref 22–32)
Calcium: 9.2 mg/dL (ref 8.9–10.3)
Chloride: 94 mmol/L — ABNORMAL LOW (ref 98–111)
Creatinine, Ser: 0.78 mg/dL (ref 0.44–1.00)
GFR, Estimated: >60 mL/min
Glucose, Bld: 98 mg/dL (ref 70–99)
Potassium: 4.3 mmol/L (ref 3.5–5.1)
Sodium: 138 mmol/L (ref 135–145)

## 2024-03-15 LAB — HEMOGLOBIN A1C
Hgb A1c MFr Bld: 7.1 % — ABNORMAL HIGH (ref 4.8–5.6)
Mean Plasma Glucose: 157.07 mg/dL

## 2024-03-15 LAB — GLUCOSE, CAPILLARY
Glucose-Capillary: 100 mg/dL — ABNORMAL HIGH (ref 70–99)
Glucose-Capillary: 114 mg/dL — ABNORMAL HIGH (ref 70–99)
Glucose-Capillary: 106 mg/dL — ABNORMAL HIGH (ref 70–99)
Glucose-Capillary: 161 mg/dL — ABNORMAL HIGH (ref 70–99)

## 2024-03-15 LAB — STREP PNEUMONIAE URINARY ANTIGEN: Strep Pneumo Urinary Antigen: NEGATIVE

## 2024-03-15 LAB — MRSA NEXT GEN BY PCR, NASAL: MRSA by PCR Next Gen: NOT DETECTED

## 2024-03-15 MED ORDER — REVEFENACIN 175 MCG/3ML IN SOLN
175.0000 ug | Freq: Every day | RESPIRATORY_TRACT | Status: DC
  Administered 2024-03-15 – 2024-03-23 (×9): 175 ug via RESPIRATORY_TRACT
  Filled 2024-03-15 (×9): qty 3

## 2024-03-15 MED ORDER — SODIUM CHLORIDE 0.9 % IV SOLN
12.5000 mg | Freq: Four times a day (QID) | INTRAVENOUS | Status: DC | PRN
  Administered 2024-03-15: 12.5 mg via INTRAVENOUS
  Filled 2024-03-15: qty 12.5

## 2024-03-15 MED ORDER — ARFORMOTEROL TARTRATE 15 MCG/2ML IN NEBU
15.0000 ug | INHALATION_SOLUTION | Freq: Two times a day (BID) | RESPIRATORY_TRACT | Status: DC
  Administered 2024-03-15 – 2024-03-23 (×15): 15 ug via RESPIRATORY_TRACT
  Filled 2024-03-15 (×15): qty 2

## 2024-03-15 MED ORDER — METHYLPREDNISOLONE SODIUM SUCC 40 MG IJ SOLR
40.0000 mg | Freq: Every day | INTRAMUSCULAR | Status: DC
  Administered 2024-03-15 – 2024-03-18 (×4): 40 mg via INTRAVENOUS
  Filled 2024-03-15 (×4): qty 1

## 2024-03-15 NOTE — Progress Notes (Signed)
 " Progress Note   Patient: Christine Singleton FMW:[id number] DOB: [birth year] DOA: 03/14/2024     1 DOS: the patient was seen and examined on 03/15/2024   Brief hospital course: Kirin Brandenburger is a 33 y.o. female with medical history significant of PE on Xarelto , hypertension, diabetes, morbid obesity, cardiomyopathy who presents with shortness of breath.  She states that she has not been feeling well for about a week.  Patient tested positive for Influenza B, chest xray showed bilateral infiltrates required Bipap, 6-8L supplemental O2 admitted to [facility] service for further management and evaluation for acute hypoxic and hypercapnic respiratory failure.  Assessment and Plan: Acute on chronic hypoxic and hypercapnic respiratory failure- Presented with hypoxia, ABG showed hypercapnia. Continue Bipap for now and wean off as tolerated. PCCM consulted as she is not improving clinically. Uses 3L oxygen at baseline, CPAP at night. Repeat ABG ordered. She will need close hemodynamic, respiratory status monitoring.  Severe Sepsis secondary to multifocal pneumonia Secondary bacterial infection from influenza B- She is tachycardic, tachypneic with respiratory failure. Patient will be continued on Rocephin , azithromycin  therapy. Continue Tamiflu  5-day therapy. Continue supplemental oxygen to maintain saturation greater than 92%.  Metabolic encephalopathy- In the setting of hypercarbia. She will be on Bipap for now. Continue to monitor Co2 level. High risk for clinical deterioration.  Hypertension: Chronic diastolic CHF- Improved EF 60 to 65%. Continue losartan , Aldactone , torsemide  and Jardiance .  History of PE- Continue Xarelto . CTA negative for PE.  Obstructive sleep apnea on CPAP at night Continue BiPAP for now. Continue PAP device at night with oxygen.  Morbid obesity BMI 62.92. Diet, exercise and weight reduction advised.     Out of bed to chair. Incentive spirometry. Nursing  supportive care. Fall, aspiration precautions. Diet:  Diet Orders (From admission, onward)     Start     Ordered   03/14/24 1703  Diet heart healthy/carb modified Room service appropriate? Yes; Fluid consistency: Thin; Fluid restriction: 2000 mL Fluid  Diet effective now       Question Answer Comment  Diet-HS Snack? Nothing   Room service appropriate? Yes   Fluid consistency: Thin   Fluid restriction: 2000 mL Fluid      03/14/24 1703           DVT prophylaxis:  rivaroxaban  (XARELTO ) tablet 20 mg  Level of care: Stepdown   Code Status: Full Code  Subjective: Patient is seen and examined today morning.  She is moderate respiratory distress, on 6 L supplemental oxygen.  RN noted she is more lethargic, placed on BiPAP.  ABG noted increased CO2, PCCM consulted.  Physical Exam: Vitals:   03/15/24 1000 03/15/24 1100 03/15/24 1150 03/15/24 1200  BP: 132/78 112/63  127/76  Pulse: 91 91  92  Resp: 15 18  15   Temp:   98 F (36.7 C)   TempSrc:   Axillary   SpO2: 95% 100%  (!) 88%  Weight:      Height:        General - Young  morbidly obese ill female, moderate respiratory distress HEENT - PERRLA, EOMI, atraumatic head, non tender sinuses. Lung - distant breath sounds, diffuse rhonchi, wheezes. Heart - S1, S2 heard, no murmurs, rubs, trace pedal edema. Abdomen - Soft, non tender, obese, bowel sounds distant Neuro - sleepy, lethargic, able to answer my questions, non focal exam. Skin - Warm and dry.  Data Reviewed:      Latest Ref Rng & Units 03/15/2024    3:07 AM 03/14/2024  2:49 PM 03/14/2024   10:22 AM  CBC  WBC 4.0 - 10.5 K/uL 9.2     Hemoglobin 12.0 - 15.0 g/dL 86.6  84.6  84.9   Hematocrit 36.0 - 46.0 % 43.4  45.0  44.0   Platelets 150 - 400 K/uL 365         Latest Ref Rng & Units 03/15/2024    3:07 AM 03/14/2024    2:49 PM 03/14/2024   10:22 AM  BMP  Glucose 70 - 99 mg/dL 98     BUN 6 - 20 mg/dL 9     Creatinine 9.55 - 1.00 mg/dL 9.21     Sodium 864  - 145 mmol/L 138  135  133   Potassium 3.5 - 5.1 mmol/L 4.3  4.3  4.8   Chloride 98 - 111 mmol/L 94     CO2 22 - 32 mmol/L 39     Calcium 8.9 - 10.3 mg/dL 9.2      CT Angio Chest PE W and/or Wo Contrast Result Date: 03/14/2024 EXAM: CTA CHEST 03/14/2024 12:21:12 PM TECHNIQUE: CTA of the chest was performed without and with the administration of 100 mL of iohexol  (OMNIPAQUE ) 350 MG/ML injection. Multiplanar reformatted images are provided for review. MIP images are provided for review. Automated exposure control, iterative reconstruction, and/or weight based adjustment of the mA/kV was utilized to reduce the radiation dose to as low as reasonably achievable. COMPARISON: None available. CLINICAL HISTORY: Pulmonary embolism (PE) suspected, high prob. FINDINGS: PULMONARY ARTERIES: Pulmonary arteries are adequately opacified for evaluation. No acute pulmonary embolus. Main pulmonary artery is normal in caliber. MEDIASTINUM: Cardiomegaly. There is no acute abnormality of the thoracic aorta. LYMPH NODES: No mediastinal, hilar or axillary lymphadenopathy. LUNGS AND PLEURA: Consolidation in the left upper lobe. Patchy airspace disease in the right middle lobe and both lower lobes. Findings concerning for multifocal pneumonia. No evidence of pleural effusion or pneumothorax. UPPER ABDOMEN: Limited images of the upper abdomen are unremarkable. SOFT TISSUES AND BONES: No acute bone or soft tissue abnormality. IMPRESSION: 1. No pulmonary embolism. 2. Patchy bilateral airspace disease most concerning for multifocal pneumonia. 3. Cardiomegaly. Electronically signed by: Franky Crease MD 03/14/2024 01:56 PM EST RP Workstation: [id number]   DG Chest Portable 1 View Result Date: 03/14/2024 EXAM: 1 VIEW(S) XRAY OF THE CHEST 03/14/2024 09:08:00 AM COMPARISON: 01/02/2024 CLINICAL HISTORY: sob FINDINGS: LUNGS AND PLEURA: Diffuse prominence of the parahilar interstitial markings, most prominent in the right lower lobe, favor mild  pulmonary edema. No pleural effusion. No pneumothorax. HEART AND MEDIASTINUM: Cardiomegaly. BONES AND SOFT TISSUES: No acute osseous abnormality. IMPRESSION: 1. Cardiomegaly. 2. Diffuse prominence of the parahilar interstitial markings, most prominent in the right lower lobe, favoring mild pulmonary edema. Electronically signed by: Selinda Blue MD 03/14/2024 10:38 AM EST RP Workstation: [id number]    Family Communication: Discussed with patient, she understand and agree. All questions answered.  Disposition: Status is: Inpatient Remains inpatient appropriate because: ill patient need close hemodynamic, respiratory monitoring in SDU, PCCM consulted.  Planned Discharge Destination: Home     Time spent: 55 minutes  Author: Concepcion Riser, MD 03/15/2024 12:48 PM Secure chat 7am to 7pm For on call review [email or web address].    "

## 2024-03-15 NOTE — Progress Notes (Signed)
" °   03/15/24 1405  BiPAP/CPAP/SIPAP  BiPAP/CPAP/SIPAP Pt Type Adult  BiPAP/CPAP/SIPAP SERVO  Mask Type Full face mask  Dentures removed? Not applicable  Mask Size Extra large  Set Rate 12 breaths/min  Respiratory Rate 22 breaths/min  IPAP 24 cmH20  EPAP 10 cmH2O  PEEP 10 cmH20  FiO2 (%) 50 %  Minute Ventilation 8.2  Leak 55  Peak Inspiratory Pressure (PIP) 23  Tidal Volume (Vt) 576  Patient Home Machine No  Patient Home Mask No  Patient Home Tubing No  Auto Titrate No  Press High Alarm 30 cmH2O  Device Plugged into RED Power Outlet Yes  BiPAP/CPAP /SiPAP Vitals  Bilateral Breath Sounds Diminished    "

## 2024-03-15 NOTE — Consult Note (Addendum)
 "  NAME:  Christine Singleton, MRN:  [id number], DOB:  May 06, 1990, LOS: 1 ADMISSION DATE:  03/14/2024, CONSULTATION DATE:  12/29 REFERRING MD:  Dr. Darci, CHIEF COMPLAINT:  acute on chronic hypercapnic and hypoxic respiratory failure, Influenza B, Pneumonia    History of Present Illness:  Christine Singleton is a 33 y.o. female with PMH of OSA on CPAP at home with PRN supplemental oxygen, pulmonary embolism on xarelto  ,HTN, diabetes, chronic diastolic CHF who presented to the ED 12/28 for shortness of breath for about 1 week. Patient with exposures to Influenza at home. Patient was found to be hypoxic in the ED and placed on increasing amounts of O2 via [place] ultimately placed on BIPAP with improvement. Patient found to be positive for Influenza B, CTA chest negative for PE but concerning for multifocal pneumonia. Patient currently received Tamiflu , Rocephin , Doxycycline , duonebs. PCCM consulted for worsening hypoxia and hypercapnia. Will continue BIPAP for now, scheduled breathing treatments, with close monitoring of patient for worsening respiratory failure.  Pertinent  Medical History   has a past medical history of CHF (congestive heart failure) (HCC), Gestational diabetes, History of cesarean delivery (04/23/2016), Hypertension, PE (pulmonary thromboembolism) (HCC), and Sleep apnea.   Significant Hospital Events: Including procedures, antibiotic start and stop dates in addition to other pertinent events   12/20: PCCM consulted for worsening hypoxic and hypercapnic respiratory failure   Interim History / Subjective:   Patient somewhat lethargic but answering some questions. Oriented to person and year only. Reports feeling off, but denies specific complaints when asked.  Objective    Blood pressure 122/79, pulse 99, temperature 98 F (36.7 C), temperature source Axillary, resp. rate 17, height 5' 2 (1.575 m), weight (!) 156 kg, SpO2 95%.    FiO2 (%):  [40 %] 40 % PEEP:  [8 cmH20] 8 cmH20 Pressure  Support:  [14 cmH20] 14 cmH20   Intake/Output Summary (Last 24 hours) at 03/15/2024 1353 Last data filed at 03/15/2024 1339 Gross per 24 hour  Intake 1534.73 ml  Output 1750 ml  Net -215.27 ml   Filed Weights   03/14/24 0858 03/14/24 1754  Weight: (!) 146.1 kg (!) 156 kg    Examination: General: resting in bed, no acute distress, but appears somewhat lethargic  HENT: [place]/AT, pupils PEARL Lungs: somewhat decreased breath sounds, partly due to body habitus. On [place] but will switch to bipap Cardiovascular: RRR  Abdomen: non distended, non tender, normoactive bowel sounds  Extremities: no deformity  Neuro: oriented to person, year, but not to place; somewhat confused secondary to hypercapnia    Resolved problem list   Assessment and Plan   Influenza B Multifocal Pneumonia  Acute Hypoxemic and Hypercapnic Respiratory Failure secondary to above -CTA: negative for PE, patchy bilateral airspace disease concerning for multifocal pneumonia -Rocephin /Doxycycline  (12/29), Tamiflu  (12/29) -Brovana  and Yupelri , PRN duonebs -MRSA screen negative; check strep pneumoniae and legionella antigen, sputum culture -Resume BIPAP this afternoon due to worsening hypercapnia and mental status  Hypertension Chronic Diastolic Congestive Heart Failure  -No longer follows with heart failure clinic due to improved EF from 45-50% to 60-65%, now follows with general cardiology -Losartan , aldactone , torsemide , Jardiance    History of Pulmonary embolism (2024) -continue Xarelto  -CTA negative for PE  Type II Diabetes, non insulin  requiring -Continue Jardiance   -On metformin  at home, not ordered due to sepsis   Obesity OSA -uses CPAP and O2 qhs at home  Labs   CBC: Recent Labs  Lab 03/14/24 0851 03/14/24 1022 03/14/24 1449 03/15/24 9692  WBC 11.0*  --   --  9.2  NEUTROABS 8.6*  --   --   --   HGB 12.9 15.0 15.3* 13.3  HCT 41.2 44.0 45.0 43.4  MCV 70.5*  --   --  72.0*  PLT 369  --   --  365     Basic Metabolic Panel: Recent Labs  Lab 03/14/24 0851 03/14/24 1022 03/14/24 1449 03/15/24 0307  NA 134* 133* 135 138  K 5.1 4.8 4.3 4.3  CL 94*  --   --  94*  CO2 32  --   --  39*  GLUCOSE 141*  --   --  98  BUN 9  --   --  9  CREATININE 0.69  --   --  0.78  CALCIUM 9.0  --   --  9.2   GFR: Estimated Creatinine Clearance: 148.8 mL/min (by C-G formula based on SCr of 0.78 mg/dL). Recent Labs  Lab 03/14/24 0851 03/14/24 1119 03/15/24 0307  WBC 11.0*  --  9.2  LATICACIDVEN  --  0.7  --     Liver Function Tests: Recent Labs  Lab 03/14/24 0851  AST 27  ALT 12  ALKPHOS 127*  BILITOT 0.6  PROT 7.4  ALBUMIN 3.8   No results for input(s): LIPASE, AMYLASE in the last 168 hours. No results for input(s): AMMONIA in the last 168 hours.  ABG    Component Value Date/Time   PHART 7.32 (L) 03/15/2024 1233   PCO2ART 89 (HH) 03/15/2024 1233   PO2ART 109 (H) 03/15/2024 1233   HCO3 45.9 (H) 03/15/2024 1233   TCO2 45 (H) 03/14/2024 1449   O2SAT 99 03/15/2024 1233     Coagulation Profile: No results for input(s): INR, PROTIME in the last 168 hours.  Cardiac Enzymes: No results for input(s): CKTOTAL, CKMB, CKMBINDEX, TROPONINI in the last 168 hours.  HbA1C: Hgb A1c MFr Bld  Date/Time Value Ref Range Status  03/15/2024 03:07 AM 7.1 (H) 4.8 - 5.6 % Final    Comment:    (NOTE) Diagnosis of Diabetes The following HbA1c ranges recommended by the American Diabetes Association (ADA) may be used as an aid in the diagnosis of diabetes mellitus.  Hemoglobin             Suggested A1C NGSP%              Diagnosis  <5.7                   Non Diabetic  5.7-6.4                Pre-Diabetic  >6.4                   Diabetic  <7.0                   Glycemic control for                       adults with diabetes.    01/27/2023 10:31 AM 6.6 (H) 4.8 - 5.6 % Final    Comment:    (NOTE) Pre diabetes:          5.7%-6.4%  Diabetes:               >6.4%  Glycemic control for   <7.0% adults with diabetes     CBG: Recent Labs  Lab 03/14/24 1511 03/14/24 2134 03/15/24 0755 03/15/24 1155  GLUCAP 106* 118*  100* 114*    Review of Systems:   See HPI  Past Medical History:  She,  has a past medical history of CHF (congestive heart failure) (HCC), Gestational diabetes, History of cesarean delivery (04/23/2016), Hypertension, PE (pulmonary thromboembolism) (HCC), and Sleep apnea.   Surgical History:   Past Surgical History:  Procedure Laterality Date   CESAREAN SECTION     CESAREAN SECTION N/A 02/03/2016   Procedure: CESAREAN SECTION;  Surgeon: Burnard VEAR Pate, MD;  Location: [facility] BIRTHING SUITES;  Service: Obstetrics;  Laterality: N/A;     Social History:   reports that she has never smoked. She has never used smokeless tobacco. She reports current drug use. Drug: Marijuana. She reports that she does not drink alcohol.   Family History:  Her family history includes Asthma in her sister; Hyperlipidemia in her father and mother; Hypertension in her father and mother; Migraines in her mother. There is no history of Cancer or Diabetes.   Allergies Allergies[1]   Home Medications  Prior to Admission medications  Medication Sig Start Date End Date Taking? Authorizing Provider  empagliflozin  (JARDIANCE ) 10 MG TABS tablet Take 1 tablet (10 mg total) by mouth daily. 01/05/24  Yes Arrien, Elidia Sieving, MD  losartan  (COZAAR ) 25 MG tablet Take 1 tablet (25 mg total) by mouth daily. 02/16/24  Yes Hayes Beckey CROME, NP  metFORMIN  (GLUCOPHAGE ) 500 MG tablet Take 1 tablet (500 mg total) by mouth daily with breakfast. 01/05/24  Yes Arrien, Elidia Sieving, MD  OXYGEN Inhale 3 L/min into the lungs at bedtime.   Yes [provider]  rivaroxaban  (XARELTO ) 20 MG TABS tablet Take 1 tablet (20 mg total) by mouth daily. 10/21/23  Yes Zenaida Morene PARAS, MD  spironolactone  (ALDACTONE ) 25 MG tablet Take 1 tablet (25 mg total) by mouth daily.  01/05/24 02/15/25 Yes Arrien, Elidia Sieving, MD  torsemide  (DEMADEX ) 20 MG tablet Take 1 tablet (20 mg total) by mouth daily. 01/05/24  Yes Arrien, Elidia Sieving, MD  TYLENOL  500 MG tablet Take 1 tablet (500 mg total) by mouth every 6 (six) hours as needed (for headaches). 11/20/23  Yes Arrien, Elidia Sieving, MD     Critical care time: 30 minutes      Derionna Salvador, PA-C [facility] For pager details, please see AMION or use Epic chat  After 1900, please call [facility] for cross coverage needs 03/15/2024, 1:53 PM       [1]  Allergies Allergen Reactions   Strawberry Extract Anaphylaxis   "

## 2024-03-15 NOTE — Progress Notes (Signed)
" °   03/15/24 2055  BiPAP/CPAP/SIPAP  BiPAP/CPAP/SIPAP Pt Type Adult  BiPAP/CPAP/SIPAP SERVO (air)  Mask Type Full face mask  Dentures removed? Not applicable  Mask Size Large  Set Rate 12 breaths/min  Respiratory Rate 17 breaths/min  IPAP 24 cmH20  EPAP 10 cmH2O  Pressure Support 14 cmH20 (PC)  PEEP 10 cmH20  FiO2 (%) 50 %  Minute Ventilation 7.9  Leak 12  Peak Inspiratory Pressure (PIP) 24  Tidal Volume (Vt) 559  Patient Home Machine No  Patient Home Mask No  Patient Home Tubing No  Auto Titrate No  Press High Alarm 30 cmH2O  CPAP/SIPAP surface wiped down Yes  Device Plugged into RED Power Outlet Yes  BiPAP/CPAP /SiPAP Vitals  Pulse Rate (!) 104  Resp 17  SpO2 95 %  Bilateral Breath Sounds Diminished  MEWS Score/Color  MEWS Score 2  MEWS Score Color Yellow    "

## 2024-03-15 NOTE — Plan of Care (Signed)

## 2024-03-15 NOTE — Progress Notes (Signed)
 Pt was having a hard time getting her volumes on the BIPAP.  I was called to the room and a nasal trumpet was placed by the MD.  Changed patients mask to a extra large for a better fit and comfort in tolerating the BIPAP. RT will continue to access and monitor.

## 2024-03-16 DIAGNOSIS — J9621 Acute and chronic respiratory failure with hypoxia: Secondary | ICD-10-CM | POA: Diagnosis not present

## 2024-03-16 DIAGNOSIS — G4730 Sleep apnea, unspecified: Secondary | ICD-10-CM

## 2024-03-16 DIAGNOSIS — J188 Other pneumonia, unspecified organism: Secondary | ICD-10-CM | POA: Diagnosis not present

## 2024-03-16 DIAGNOSIS — J09X1 Influenza due to identified novel influenza A virus with pneumonia: Secondary | ICD-10-CM | POA: Diagnosis not present

## 2024-03-16 DIAGNOSIS — J9622 Acute and chronic respiratory failure with hypercapnia: Secondary | ICD-10-CM | POA: Diagnosis not present

## 2024-03-16 DIAGNOSIS — G4733 Obstructive sleep apnea (adult) (pediatric): Secondary | ICD-10-CM | POA: Diagnosis not present

## 2024-03-16 DIAGNOSIS — J101 Influenza due to other identified influenza virus with other respiratory manifestations: Principal | ICD-10-CM

## 2024-03-16 DIAGNOSIS — G9341 Metabolic encephalopathy: Secondary | ICD-10-CM | POA: Insufficient documentation

## 2024-03-16 DIAGNOSIS — J1008 Influenza due to other identified influenza virus with other specified pneumonia: Secondary | ICD-10-CM | POA: Diagnosis not present

## 2024-03-16 DIAGNOSIS — I110 Hypertensive heart disease with heart failure: Secondary | ICD-10-CM | POA: Diagnosis not present

## 2024-03-16 LAB — BLOOD GAS, ARTERIAL
Acid-Base Excess: 15.3 mmol/L — ABNORMAL HIGH (ref 0.0–2.0)
Acid-Base Excess: 19.5 mmol/L — ABNORMAL HIGH (ref 0.0–2.0)
Bicarbonate: 47.3 mmol/L — ABNORMAL HIGH (ref 20.0–28.0)
Bicarbonate: 49.7 mmol/L — ABNORMAL HIGH (ref 20.0–28.0)
Delivery systems: POSITIVE
Drawn by: 25788
Expiratory PAP: 10 cmH2O
FIO2: 40 %
FIO2: 40 %
Inspiratory PAP: 14 cmH2O
Mode: POSITIVE
O2 Saturation: 92 %
O2 Saturation: 98.5 %
Patient temperature: 36.7
Patient temperature: 36.9
pCO2 arterial: 102 mmHg (ref 32–48)
pCO2 arterial: 86 mmHg (ref 32–48)
pH, Arterial: 7.27 — ABNORMAL LOW (ref 7.35–7.45)
pH, Arterial: 7.37 (ref 7.35–7.45)
pO2, Arterial: 59 mmHg — ABNORMAL LOW (ref 83–108)
pO2, Arterial: 100 mmHg (ref 83–108)

## 2024-03-16 LAB — LEGIONELLA PNEUMOPHILA SEROGP 1 UR AG: L. pneumophila Serogp 1 Ur Ag: NEGATIVE

## 2024-03-16 LAB — GLUCOSE, CAPILLARY
Glucose-Capillary: 252 mg/dL — ABNORMAL HIGH (ref 70–99)
Glucose-Capillary: 122 mg/dL — ABNORMAL HIGH (ref 70–99)
Glucose-Capillary: 132 mg/dL — ABNORMAL HIGH (ref 70–99)
Glucose-Capillary: 106 mg/dL — ABNORMAL HIGH (ref 70–99)

## 2024-03-16 MED ORDER — GUAIFENESIN ER 600 MG PO TB12
1200.0000 mg | ORAL_TABLET | Freq: Two times a day (BID) | ORAL | Status: DC
  Administered 2024-03-16 – 2024-03-23 (×14): 1200 mg via ORAL
  Filled 2024-03-16 (×14): qty 2

## 2024-03-16 MED ORDER — OXYCODONE HCL 5 MG PO TABS
5.0000 mg | ORAL_TABLET | Freq: Four times a day (QID) | ORAL | Status: DC | PRN
  Administered 2024-03-16 – 2024-03-21 (×4): 5 mg via ORAL
  Filled 2024-03-16 (×4): qty 1

## 2024-03-16 NOTE — Progress Notes (Addendum)
 " Progress Note   Patient: Christine Singleton FMW:[id number] DOB: 09-22-1990 DOA: 03/14/2024     2 DOS: the patient was seen and examined on 03/16/2024   Brief hospital course: Christine Singleton is a 33 y.o. female with medical history significant of PE on Xarelto , hypertension, diabetes, morbid obesity, cardiomyopathy who presents with shortness of breath.  She states that she has not been feeling well for about a week.  Patient tested positive for Influenza B, chest xray showed bilateral infiltrates required Bipap, 6-8L supplemental O2 admitted to TR[facility] service for further management and evaluation for acute hypoxic and hypercapnic respiratory failure.  Assessment and Plan: Acute on chronic hypoxic and hypercapnic respiratory failure- Presented with hypoxia, ABG showed hypercapnia. Uses 3L oxygen at baseline, CPAP at night. Sporadic drop in oxygen saturation per RN. Repeat ABG Ph 7.27, PCO2 102, po2 59.  She is placed on Bipap. Continue Bipap prn and NRB wean off as tolerated. PCCM on board. Close respiratory monitoring.  Severe Sepsis secondary to multifocal pneumonia Secondary bacterial infection from influenza B- Presented with tachycardic, tachypneic with respiratory failure. Continue Rocephin , azithromycin  therapy. Continue Tamiflu  5-day therapy. Continue Bipap, supplemental oxygen to maintain saturation greater than 92%.  Metabolic encephalopathy- In the setting of hypercarbia. Co2 102 today She will be on Bipap for now. Continue to monitor Co2 level. Close neurochecks, delirium precautions. High risk for clinical deterioration.  Hypertension: Chronic diastolic CHF- Improved EF 60 to 65%. Continue losartan , Aldactone , torsemide  and Jardiance .  History of PE- Continue Xarelto . CTA negative for PE.  Obstructive sleep apnea on CPAP at night Continue BiPAP for now. Continue PAP device at night with oxygen.  Morbid obesity BMI 62.92. Diet, exercise and weight reduction  advised.     Out of bed to chair. Incentive spirometry. Nursing supportive care. Fall, aspiration precautions. Diet:  Diet Orders (From admission, onward)     Start     Ordered   03/14/24 1703  Diet heart healthy/carb modified Room service appropriate? Yes; Fluid consistency: Thin; Fluid restriction: 2000 mL Fluid  Diet effective now       Question Answer Comment  Diet-HS Snack? Nothing   Room service appropriate? Yes   Fluid consistency: Thin   Fluid restriction: 2000 mL Fluid      03/14/24 1703           DVT prophylaxis:  rivaroxaban  (XARELTO ) tablet 20 mg  Level of care: Stepdown   Code Status: Full Code  Subjective: Patient is seen and examined today morning.  She is mild respiratory distress, on 15L high flow O2.  RN noted she drop in saturations at times. She is more alert during my exam.  Physical Exam: Vitals:   03/16/24 1100 03/16/24 1105 03/16/24 1200 03/16/24 1300  BP: 113/72  131/78   Pulse: 84 92 91   Resp: 15 10 15    Temp:    98.4 F (36.9 C)  TempSrc:    Axillary  SpO2: 99% 93% 90%   Weight:      Height:        General - Young  morbidly obese ill female, mild respiratory distress HEENT - PERRLA, EOMI, atraumatic head, non tender sinuses. Lung - distant breath sounds, diffuse rhonchi, wheezes. Heart - S1, S2 heard, no murmurs, rubs, trace pedal edema. Abdomen - Soft, non tender, obese, bowel sounds distant Neuro - alert, awake, oriented, able to answer my questions, non focal exam. Skin - Warm and dry.  Data Reviewed:      Latest Ref  Rng & Units 03/15/2024    3:07 AM 03/14/2024    2:49 PM 03/14/2024   10:22 AM  CBC  WBC 4.0 - 10.5 K/uL 9.2     Hemoglobin 12.0 - 15.0 g/dL 86.6  84.6  84.9   Hematocrit 36.0 - 46.0 % 43.4  45.0  44.0   Platelets 150 - 400 K/uL 365         Latest Ref Rng & Units 03/15/2024    3:07 AM 03/14/2024    2:49 PM 03/14/2024   10:22 AM  BMP  Glucose 70 - 99 mg/dL 98     BUN 6 - 20 mg/dL 9     Creatinine  9.55 - 1.00 mg/dL 9.21     Sodium 864 - 854 mmol/L 138  135  133   Potassium 3.5 - 5.1 mmol/L 4.3  4.3  4.8   Chloride 98 - 111 mmol/L 94     CO2 22 - 32 mmol/L 39     Calcium 8.9 - 10.3 mg/dL 9.2      No results found.   Family Communication: Discussed with patient, she understand and agree. All questions answered.  Disposition: Status is: Inpatient Remains inpatient appropriate because: ill patient need close hemodynamic, respiratory monitoring in SDU, PCCM following.  Planned Discharge Destination: Home     Time spent: 54 minutes  Author: Concepcion Riser, MD 03/16/2024 1:49 PM Secure chat 7am to 7pm For on call review [email or web address].    "

## 2024-03-16 NOTE — Progress Notes (Addendum)
 "  NAME:  Christine Singleton, MRN:  [id number], DOB:  [birth year], LOS: 2 ADMISSION DATE:  03/14/2024, CONSULTATION DATE:  12/29 REFERRING MD:  Dr. Darci, CHIEF COMPLAINT:  acute on chronic hypercapnic and hypoxic respiratory failure, Influenza B, Pneumonia    History of Present Illness:  Christine Singleton is a 33 y.o. female with PMH of OSA on CPAP at home with PRN supplemental oxygen, pulmonary embolism on xarelto  ,HTN, diabetes, chronic diastolic CHF who presented to the ED 12/28 for shortness of breath for about 1 week. Patient with exposures to Influenza at home. Patient was found to be hypoxic in the ED and placed on increasing amounts of O2 via [place] ultimately placed on BIPAP with improvement. Patient found to be positive for Influenza B, CTA chest negative for PE but concerning for multifocal pneumonia. Patient currently received Tamiflu , Rocephin , Doxycycline , duonebs. PCCM consulted for worsening hypoxia and hypercapnia. Will continue BIPAP for now, scheduled breathing treatments, with close monitoring of patient for worsening respiratory failure.  Pertinent  Medical History   has a past medical history of CHF (congestive heart failure) (HCC), Gestational diabetes, History of cesarean delivery (04/23/2016), Hypertension, PE (pulmonary thromboembolism) (HCC), and Sleep apnea.   Significant Hospital Events: Including procedures, antibiotic start and stop dates in addition to other pertinent events   12/20: PCCM consulted for worsening hypoxic and hypercapnic respiratory failure   Interim History / Subjective:   Patients mentation is much better today, she is oriented x4, reports she is feeling much better today and her mind is more clear. Reports intermittent productive cough, not short of breath this morning.   Objective    Blood pressure 133/81, pulse 94, temperature 98.5 F (36.9 C), temperature source Oral, resp. rate 13, height 5' 2 (1.575 m), weight (!) 156 kg, SpO2 100%.    FiO2 (%):   [50 %] 50 % PEEP:  [10 cmH20] 10 cmH20 Pressure Support:  [14 cmH20] 14 cmH20   Intake/Output Summary (Last 24 hours) at 03/16/2024 0804 Last data filed at 03/16/2024 0100 Gross per 24 hour  Intake 889 ml  Output 2400 ml  Net -1511 ml   Filed Weights   03/14/24 0858 03/14/24 1754  Weight: (!) 146.1 kg (!) 156 kg    Examination: General: resting in bed, no acute distress, more alert today HENT: [place]/AT, pupils PEARL Lungs: somewhat diminished breath sounds, no wheezing. On [place] 15L Cardiovascular: RRR  Abdomen: non distended, non tender, normoactive bowel sounds  Extremities: no deformity  Neuro: oriented x4     Resolved problem list   Assessment and Plan   Influenza B Multifocal Pneumonia  Acute Hypoxemic and Hypercapnic Respiratory Failure secondary to above -CTA: negative for PE, patchy bilateral airspace disease concerning for multifocal pneumonia -Rocephin /Doxycycline  (12/29), Tamiflu  (12/29) -Brovana  and Yupelri , PRN duonebs -MRSA screen negative; check strep pneumoniae and legionella antigen, sputum culture -On 15L [place] this morning, mentation improved. Use BIPAP prn and qhs  Hypertension Chronic Diastolic Congestive Heart Failure  -No longer follows with heart failure clinic due to improved EF from 45-50% to 60-65%, now follows with general cardiology -Losartan , aldactone , torsemide , Jardiance    History of Pulmonary embolism (2024) -continue Xarelto  -CTA negative for PE  Type II Diabetes, non insulin  requiring -Continue Jardiance   -On metformin  at home, not ordered due to sepsis   Obesity OSA -uses CPAP and O2 qhs at home  Labs   CBC: Recent Labs  Lab 03/14/24 0851 03/14/24 1022 03/14/24 1449 03/15/24 0307  WBC 11.0*  --   --  9.2  NEUTROABS 8.6*  --   --   --   HGB 12.9 15.0 15.3* 13.3  HCT 41.2 44.0 45.0 43.4  MCV 70.5*  --   --  72.0*  PLT 369  --   --  365    Basic Metabolic Panel: Recent Labs  Lab 03/14/24 0851 03/14/24 1022  03/14/24 1449 03/15/24 0307  NA 134* 133* 135 138  K 5.1 4.8 4.3 4.3  CL 94*  --   --  94*  CO2 32  --   --  39*  GLUCOSE 141*  --   --  98  BUN 9  --   --  9  CREATININE 0.69  --   --  0.78  CALCIUM 9.0  --   --  9.2   GFR: Estimated Creatinine Clearance: 148.8 mL/min (by C-G formula based on SCr of 0.78 mg/dL). Recent Labs  Lab 03/14/24 0851 03/14/24 1119 03/15/24 0307  WBC 11.0*  --  9.2  LATICACIDVEN  --  0.7  --     Liver Function Tests: Recent Labs  Lab 03/14/24 0851  AST 27  ALT 12  ALKPHOS 127*  BILITOT 0.6  PROT 7.4  ALBUMIN 3.8   No results for input(s): LIPASE, AMYLASE in the last 168 hours. No results for input(s): AMMONIA in the last 168 hours.  ABG    Component Value Date/Time   PHART 7.32 (L) 03/15/2024 1233   PCO2ART 89 (HH) 03/15/2024 1233   PO2ART 109 (H) 03/15/2024 1233   HCO3 45.9 (H) 03/15/2024 1233   TCO2 45 (H) 03/14/2024 1449   O2SAT 99 03/15/2024 1233     Coagulation Profile: No results for input(s): INR, PROTIME in the last 168 hours.  Cardiac Enzymes: No results for input(s): CKTOTAL, CKMB, CKMBINDEX, TROPONINI in the last 168 hours.  HbA1C: Hgb A1c MFr Bld  Date/Time Value Ref Range Status  03/15/2024 03:07 AM 7.1 (H) 4.8 - 5.6 % Final    Comment:    (NOTE) Diagnosis of Diabetes The following HbA1c ranges recommended by the American Diabetes Association (ADA) may be used as an aid in the diagnosis of diabetes mellitus.  Hemoglobin             Suggested A1C NGSP%              Diagnosis  <5.7                   Non Diabetic  5.7-6.4                Pre-Diabetic  >6.4                   Diabetic  <7.0                   Glycemic control for                       adults with diabetes.    01/27/2023 10:31 AM 6.6 (H) 4.8 - 5.6 % Final    Comment:    (NOTE) Pre diabetes:          5.7%-6.4%  Diabetes:              >6.4%  Glycemic control for   <7.0% adults with diabetes     CBG: Recent Labs   Lab 03/15/24 0755 03/15/24 1155 03/15/24 1615 03/15/24 2134 03/16/24 0743  GLUCAP 100* 114* 106* 161* 252*  Review of Systems:   See HPI  Past Medical History:  She,  has a past medical history of CHF (congestive heart failure) (HCC), Gestational diabetes, History of cesarean delivery (04/23/2016), Hypertension, PE (pulmonary thromboembolism) (HCC), and Sleep apnea.   Surgical History:   Past Surgical History:  Procedure Laterality Date   CESAREAN SECTION     CESAREAN SECTION N/A 02/03/2016   Procedure: CESAREAN SECTION;  Surgeon: Burnard VEAR Pate, MD;  Location: [facility] BIRTHING SUITES;  Service: Obstetrics;  Laterality: N/A;     Social History:   reports that she has never smoked. She has never used smokeless tobacco. She reports current drug use. Drug: Marijuana. She reports that she does not drink alcohol.   Family History:  Her family history includes Asthma in her sister; Hyperlipidemia in her father and mother; Hypertension in her father and mother; Migraines in her mother. There is no history of Cancer or Diabetes.   Allergies Allergies[1]   Home Medications  Prior to Admission medications  Medication Sig Start Date End Date Taking? Authorizing Provider  empagliflozin  (JARDIANCE ) 10 MG TABS tablet Take 1 tablet (10 mg total) by mouth daily. 01/05/24  Yes Arrien, Elidia Sieving, MD  losartan  (COZAAR ) 25 MG tablet Take 1 tablet (25 mg total) by mouth daily. 02/16/24  Yes Hayes Beckey CROME, NP  metFORMIN  (GLUCOPHAGE ) 500 MG tablet Take 1 tablet (500 mg total) by mouth daily with breakfast. 01/05/24  Yes Arrien, Elidia Sieving, MD  OXYGEN Inhale 3 L/min into the lungs at bedtime.   Yes [provider]  rivaroxaban  (XARELTO ) 20 MG TABS tablet Take 1 tablet (20 mg total) by mouth daily. 10/21/23  Yes Zenaida Morene PARAS, MD  spironolactone  (ALDACTONE ) 25 MG tablet Take 1 tablet (25 mg total) by mouth daily. 01/05/24 02/15/25 Yes Arrien, Elidia Sieving, MD  torsemide   (DEMADEX ) 20 MG tablet Take 1 tablet (20 mg total) by mouth daily. 01/05/24  Yes Arrien, Elidia Sieving, MD  TYLENOL  500 MG tablet Take 1 tablet (500 mg total) by mouth every 6 (six) hours as needed (for headaches). 11/20/23  Yes Arrien, Elidia Sieving, MD     Critical care time: 32 minutes      Kenlynn Houde, PA-C [facility] For pager details, please see AMION or use Epic chat  After 1900, please call [facility] for cross coverage needs 03/16/2024, 8:04 AM        [1]  Allergies Allergen Reactions   Strawberry Extract Anaphylaxis   "

## 2024-03-16 NOTE — Plan of Care (Signed)
  Problem: Education: Goal: Knowledge of General Education information will improve Description Including pain rating scale, medication(s)/side effects and non-pharmacologic comfort measures Outcome: Progressing   Problem: Health Behavior/Discharge Planning: Goal: Ability to manage health-related needs will improve Outcome: Progressing   Problem: Nutrition: Goal: Adequate nutrition will be maintained Outcome: Progressing   Problem: Coping: Goal: Level of anxiety will decrease Outcome: Progressing   Problem: Elimination: Goal: Will not experience complications related to bowel motility Outcome: Progressing   Problem: Safety: Goal: Ability to remain free from injury will improve Outcome: Progressing   Problem: Skin Integrity: Goal: Risk for impaired skin integrity will decrease Outcome: Progressing   

## 2024-03-17 DIAGNOSIS — J9602 Acute respiratory failure with hypercapnia: Secondary | ICD-10-CM | POA: Diagnosis not present

## 2024-03-17 DIAGNOSIS — E119 Type 2 diabetes mellitus without complications: Secondary | ICD-10-CM | POA: Diagnosis not present

## 2024-03-17 DIAGNOSIS — J9622 Acute and chronic respiratory failure with hypercapnia: Secondary | ICD-10-CM | POA: Diagnosis not present

## 2024-03-17 DIAGNOSIS — G4733 Obstructive sleep apnea (adult) (pediatric): Secondary | ICD-10-CM | POA: Diagnosis not present

## 2024-03-17 DIAGNOSIS — I5032 Chronic diastolic (congestive) heart failure: Secondary | ICD-10-CM | POA: Diagnosis not present

## 2024-03-17 DIAGNOSIS — Z86711 Personal history of pulmonary embolism: Secondary | ICD-10-CM | POA: Diagnosis not present

## 2024-03-17 DIAGNOSIS — J9601 Acute respiratory failure with hypoxia: Secondary | ICD-10-CM | POA: Diagnosis not present

## 2024-03-17 DIAGNOSIS — I110 Hypertensive heart disease with heart failure: Secondary | ICD-10-CM | POA: Diagnosis not present

## 2024-03-17 DIAGNOSIS — J9621 Acute and chronic respiratory failure with hypoxia: Secondary | ICD-10-CM | POA: Diagnosis not present

## 2024-03-17 DIAGNOSIS — J1008 Influenza due to other identified influenza virus with other specified pneumonia: Secondary | ICD-10-CM | POA: Diagnosis not present

## 2024-03-17 LAB — CBC WITH DIFFERENTIAL/PLATELET
Abs Immature Granulocytes: 0.01 K/uL (ref 0.00–0.07)
Basophils Absolute: 0 K/uL (ref 0.0–0.1)
Basophils Relative: 0 %
Eosinophils Absolute: 0 K/uL (ref 0.0–0.5)
Eosinophils Relative: 0 %
HCT: 45.8 % (ref 36.0–46.0)
Hemoglobin: 13.7 g/dL (ref 12.0–15.0)
Immature Granulocytes: 0 %
Lymphocytes Relative: 23 %
Lymphs Abs: 1.5 K/uL (ref 0.7–4.0)
MCH: 21.7 pg — ABNORMAL LOW (ref 26.0–34.0)
MCHC: 29.9 g/dL — ABNORMAL LOW (ref 30.0–36.0)
MCV: 72.7 fL — ABNORMAL LOW (ref 80.0–100.0)
Monocytes Absolute: 0.8 K/uL (ref 0.1–1.0)
Monocytes Relative: 13 %
Neutro Abs: 4.1 K/uL (ref 1.7–7.7)
Neutrophils Relative %: 64 %
Platelets: 330 K/uL (ref 150–400)
RBC: 6.3 MIL/uL — ABNORMAL HIGH (ref 3.87–5.11)
RDW: 20.2 % — ABNORMAL HIGH (ref 11.5–15.5)
WBC: 6.4 K/uL (ref 4.0–10.5)
nRBC: 0 % (ref 0.0–0.2)

## 2024-03-17 LAB — BASIC METABOLIC PANEL WITH GFR
Anion gap: 5 (ref 5–15)
BUN: 11 mg/dL (ref 6–20)
CO2: 40 mmol/L — ABNORMAL HIGH (ref 22–32)
Calcium: 10.1 mg/dL (ref 8.9–10.3)
Chloride: 89 mmol/L — ABNORMAL LOW (ref 98–111)
Creatinine, Ser: 0.68 mg/dL (ref 0.44–1.00)
GFR, Estimated: >60 mL/min
Glucose, Bld: 92 mg/dL (ref 70–99)
Potassium: 4.7 mmol/L (ref 3.5–5.1)
Sodium: 134 mmol/L — ABNORMAL LOW (ref 135–145)

## 2024-03-17 LAB — BLOOD GAS, ARTERIAL
Acid-Base Excess: 21.7 mmol/L — ABNORMAL HIGH (ref 0.0–2.0)
Bicarbonate: 51.3 mmol/L — ABNORMAL HIGH (ref 20.0–28.0)
Drawn by: 21338
O2 Content: 10 L/min
O2 Saturation: 96.6 %
Patient temperature: 36.7
pCO2 arterial: 80 mmHg (ref 32–48)
pH, Arterial: 7.41 (ref 7.35–7.45)
pO2, Arterial: 75 mmHg — ABNORMAL LOW (ref 83–108)

## 2024-03-17 LAB — GLUCOSE, CAPILLARY
Glucose-Capillary: 97 mg/dL (ref 70–99)
Glucose-Capillary: 138 mg/dL — ABNORMAL HIGH (ref 70–99)
Glucose-Capillary: 180 mg/dL — ABNORMAL HIGH (ref 70–99)
Glucose-Capillary: 175 mg/dL — ABNORMAL HIGH (ref 70–99)

## 2024-03-17 LAB — PREGNANCY, URINE: Preg Test, Ur: NEGATIVE

## 2024-03-17 LAB — MAGNESIUM: Magnesium: 2.5 mg/dL — ABNORMAL HIGH (ref 1.7–2.4)

## 2024-03-17 MED ORDER — GUAIFENESIN-DM 100-10 MG/5ML PO SYRP
5.0000 mL | ORAL_SOLUTION | ORAL | Status: DC | PRN
  Administered 2024-03-17 – 2024-03-19 (×5): 5 mL via ORAL
  Filled 2024-03-17 (×5): qty 10

## 2024-03-17 MED ORDER — GERHARDT'S BUTT CREAM
TOPICAL_CREAM | Freq: Two times a day (BID) | CUTANEOUS | Status: DC
  Filled 2024-03-17: qty 60

## 2024-03-17 NOTE — Progress Notes (Signed)
 OT Cancellation Note  Patient Details Name: Christine Singleton MRN: [id number] DOB: 06/11/1990   Cancelled Treatment:    Reason Eval/Treat Not Completed: Medical issues which prohibited therapy. Pt with ongoing hypercapnia, requiring Bipap support today. Per medical note from today, trying to avoid intubation. Will hold and follow-up tomorrow, as appropriate.   [name], OTR/L 03/17/2024, 2:28 PM

## 2024-03-17 NOTE — Progress Notes (Deleted)
 PHARMACIST - PHYSICIAN COMMUNICATION DR:   Will CONCERNING: Antibiotic IV to Oral Route Change Policy  RECOMMENDATION: This patient is receiving doxycycline  by the intravenous route.  Based on criteria approved by the Pharmacy and Therapeutics Committee, the antibiotic(s) is/are being converted to the equivalent oral dose form(s).   DESCRIPTION: These criteria include: Patient being treated for a respiratory tract infection, urinary tract infection, cellulitis or clostridium difficile associated diarrhea if on metronidazole  The patient is not neutropenic and does not exhibit a GI malabsorption state The patient is eating (either orally or via tube) and/or has been taking other orally administered medications for a least 24 hours The patient is improving clinically and has a Tmax < 100.5  If you have questions about this conversion, please contact the Pharmacy Department  []   [phone] )  [facility] []   [phone] )  [facility]  []   [phone] )  [facility] [x]   [phone] )  [facility]   Thank you for allowing pharmacy to be a part of this patients care.  Eleanor EMERSON Agent, PharmD, BCPS Clinical Pharmacist [place] 03/17/2024 1:38 PM

## 2024-03-17 NOTE — Progress Notes (Signed)
 "  NAME:  Christine Singleton, MRN:  [id number], DOB:  Aug 22, 1990, LOS: 3 ADMISSION DATE:  03/14/2024, CONSULTATION DATE:  12/29 REFERRING MD:  Dr. Darci, CHIEF COMPLAINT:  acute on chronic hypercapnic and hypoxic respiratory failure, Influenza B, Pneumonia    History of Present Illness:  Christine Singleton is a 34 y.o. female with PMH of OSA on CPAP at home with PRN supplemental oxygen, pulmonary embolism on xarelto  ,HTN, diabetes, chronic diastolic CHF who presented to the ED 12/28 for shortness of breath for about 1 week. Patient with exposures to Influenza at home. Patient was found to be hypoxic in the ED and placed on increasing amounts of O2 via [place] ultimately placed on BIPAP with improvement. Patient found to be positive for Influenza B, CTA chest negative for PE but concerning for multifocal pneumonia. Patient currently received Tamiflu , Rocephin , Doxycycline , duonebs. PCCM consulted for worsening hypoxia and hypercapnia. Will continue BIPAP for now, scheduled breathing treatments, with close monitoring of patient for worsening respiratory failure.  Pertinent  Medical History   has a past medical history of CHF (congestive heart failure) (HCC), Gestational diabetes, History of cesarean delivery (04/23/2016), Hypertension, PE (pulmonary thromboembolism) (HCC), and Sleep apnea.   Significant Hospital Events: Including procedures, antibiotic start and stop dates in addition to other pertinent events   12/30: PCCM consulted for worsening hypoxic and hypercapnic respiratory failure  12/31: remained on intermittent bipap  Interim History / Subjective:   Patient requiring intermittent bipap, on going issues with hypercapnea  Objective    Blood pressure (!) 155/118, pulse 85, temperature (!) 97 F (36.1 C), temperature source Axillary, resp. rate 11, height 5' 2 (1.575 m), weight (!) 156 kg, SpO2 97%.    FiO2 (%):  [40 %] 40 % PEEP:  [10 cmH20] 10 cmH20 Pressure Support:  [14 cmH20] 14 cmH20    Intake/Output Summary (Last 24 hours) at 03/17/2024 0738 Last data filed at 03/17/2024 0400 Gross per 24 hour  Intake 876 ml  Output 3000 ml  Net -2124 ml   Filed Weights   03/14/24 0858 03/14/24 1754  Weight: (!) 146.1 kg (!) 156 kg    Examination: General: resting in bed, no acute distress, morbidly obese HENT: [place]/AT, pupils PEARL Lungs: somewhat diminished breath sounds, no wheezing. On bipap Cardiovascular: RRR  Abdomen: non distended, non tender, normoactive bowel sounds  Extremities: no deformity  Neuro: oriented x4     Resolved problem list   Assessment and Plan   Influenza B Multifocal Pneumonia  Acute Hypoxemic and Hypercapnic Respiratory Failure secondary to above -CTA: negative for PE, patchy bilateral airspace disease concerning for multifocal pneumonia -Rocephin /Doxycycline  (12/29), Tamiflu  (12/29) -Brovana  and Yupelri , PRN duonebs - 40mg  solumedrol daily -MRSA screen negative; check strep pneumoniae and legionella antigen, sputum culture - Goal SpO2 92% or higher. Alternate between bipap and [place].  - remains hypercapnic, hoping to avoid intubation  Hypertension Chronic Diastolic Congestive Heart Failure  -No longer follows with heart failure clinic due to improved EF from 45-50% to 60-65%, now follows with general cardiology -Losartan , aldactone , torsemide , Jardiance    History of Pulmonary embolism (2024) -continue Xarelto  -CTA negative for PE  Type II Diabetes, non insulin  requiring -Continue Jardiance   -On metformin  at home, not ordered due to sepsis   Obesity OSA -uses CPAP and O2 qhs at home  Labs   CBC: Recent Labs  Lab 03/14/24 0851 03/14/24 1022 03/14/24 1449 03/15/24 0307 03/17/24 0317  WBC 11.0*  --   --  9.2 6.4  NEUTROABS 8.6*  --   --   --  4.1  HGB 12.9 15.0 15.3* 13.3 13.7  HCT 41.2 44.0 45.0 43.4 45.8  MCV 70.5*  --   --  72.0* 72.7*  PLT 369  --   --  365 330    Basic Metabolic Panel: Recent Labs  Lab 03/14/24 0851  03/14/24 1022 03/14/24 1449 03/15/24 0307 03/17/24 0317  NA 134* 133* 135 138 134*  K 5.1 4.8 4.3 4.3 4.7  CL 94*  --   --  94* 89*  CO2 32  --   --  39* 40*  GLUCOSE 141*  --   --  98 92  BUN 9  --   --  9 11  CREATININE 0.69  --   --  0.78 0.68  CALCIUM 9.0  --   --  9.2 10.1  MG  --   --   --   --  2.5*   GFR: Estimated Creatinine Clearance: 148.8 mL/min (by C-G formula based on SCr of 0.68 mg/dL). Recent Labs  Lab 03/14/24 0851 03/14/24 1119 03/15/24 0307 03/17/24 0317  WBC 11.0*  --  9.2 6.4  LATICACIDVEN  --  0.7  --   --     Liver Function Tests: Recent Labs  Lab 03/14/24 0851  AST 27  ALT 12  ALKPHOS 127*  BILITOT 0.6  PROT 7.4  ALBUMIN 3.8   No results for input(s): LIPASE, AMYLASE in the last 168 hours. No results for input(s): AMMONIA in the last 168 hours.  ABG    Component Value Date/Time   PHART 7.37 03/16/2024 1620   PCO2ART 86 (HH) 03/16/2024 1620   PO2ART 100 03/16/2024 1620   HCO3 49.7 (H) 03/16/2024 1620   TCO2 45 (H) 03/14/2024 1449   O2SAT 98.5 03/16/2024 1620     Coagulation Profile: No results for input(s): INR, PROTIME in the last 168 hours.  Cardiac Enzymes: No results for input(s): CKTOTAL, CKMB, CKMBINDEX, TROPONINI in the last 168 hours.  HbA1C: Hgb A1c MFr Bld  Date/Time Value Ref Range Status  03/15/2024 03:07 AM 7.1 (H) 4.8 - 5.6 % Final    Comment:    (NOTE) Diagnosis of Diabetes The following HbA1c ranges recommended by the American Diabetes Association (ADA) may be used as an aid in the diagnosis of diabetes mellitus.  Hemoglobin             Suggested A1C NGSP%              Diagnosis  <5.7                   Non Diabetic  5.7-6.4                Pre-Diabetic  >6.4                   Diabetic  <7.0                   Glycemic control for                       adults with diabetes.    01/27/2023 10:31 AM 6.6 (H) 4.8 - 5.6 % Final    Comment:    (NOTE) Pre diabetes:           5.7%-6.4%  Diabetes:              >6.4%  Glycemic control for   <7.0% adults with diabetes     CBG: Recent Labs  Lab 03/16/24 0743 03/16/24 1230 03/16/24 1633 03/16/24 2136 03/17/24 0732  GLUCAP 252* 122* 132* 106* 97       Critical care time: 31 minutes     CRITICAL CARE Performed by: Dorn KATHEE Chill   Total critical care time: 31 minutes  Critical care time was exclusive of separately billable procedures and treating other patients.  Critical care was necessary to treat or prevent imminent or life-threatening deterioration.  Critical care was time spent personally by me on the following activities: development of treatment plan with patient and/or surrogate as well as nursing, discussions with consultants, evaluation of patient's response to treatment, examination of patient, obtaining history from patient or surrogate, ordering and performing treatments and interventions, ordering and review of laboratory studies, ordering and review of radiographic studies, pulse oximetry, re-evaluation of patient's condition and participation in multidisciplinary rounds.   Dorn Chill, MD [facility] Office: [phone]   See Amion for personal pager PCCM on call pager [phone] until 7pm. Please call Elink 7p-7a. [phone]    "

## 2024-03-17 NOTE — Progress Notes (Signed)
" °   03/17/24 0048  BiPAP/CPAP/SIPAP  BiPAP/CPAP/SIPAP Pt Type Adult  BiPAP/CPAP/SIPAP SERVO  Mask Type Full face mask  Dentures removed? Not applicable  Mask Size Medium  Respiratory Rate 24 breaths/min  IPAP 24 cmH20  EPAP 10 cmH2O  Pressure Support 14 cmH20  PEEP 10 cmH20  FiO2 (%) 40 %  Minute Ventilation 16.5  Leak 67  Peak Inspiratory Pressure (PIP) 17  Patient Home Machine No  Patient Home Mask No  Patient Home Tubing No  Auto Titrate No  Press High Alarm 35 cmH2O  Device Plugged into RED Power Outlet Yes  BiPAP/CPAP /SiPAP Vitals  Resp (!) 24    "

## 2024-03-17 NOTE — Progress Notes (Signed)
" °   03/17/24 2227  BiPAP/CPAP/SIPAP  BiPAP/CPAP/SIPAP Pt Type Adult  BiPAP/CPAP/SIPAP SERVO  Dentures removed? Not applicable  Mask Size Medium  Set Rate 22 breaths/min  Respiratory Rate 23 breaths/min  IPAP 24 cmH20  EPAP 10 cmH2O  FiO2 (%) 40 %  Minute Ventilation 12.8  Leak 28  Peak Inspiratory Pressure (PIP) 23  Tidal Volume (Vt) 420  Patient Home Machine No  Patient Home Mask No  Patient Home Tubing No  Auto Titrate No  Press High Alarm 35 cmH2O  Press Low Alarm 5 cmH2O  Device Plugged into RED Power Outlet Yes    "

## 2024-03-17 NOTE — TOC Initial Note (Signed)
 Transition of Care [facility]) - Initial/Assessment Note    Patient Details  Name: Christine Singleton MRN: [id number] Date of Birth: [birth year]  Transition of Care [facility]) CM/SW Contact:    Jon ONEIDA Anon, RN Phone Number: 03/17/2024, 4:04 PM  Clinical Narrative:                 Pt is from home. Pt came to the ED with low oxygen saturation. Wears O2 at home at baseline. Pt requiring Bipap at this time. Pt needing continued medical workup, not medically ready for discharge. Awaiting PT/OT evaluation. ICM will continue to follow for DC planning needs.  Expected Discharge Plan:  (TBD) Barriers to Discharge: Continued Medical Work up   Patient Goals and CMS Choice Patient states their goals for this hospitalization and ongoing recovery are:: Home CMS Medicare.gov Compare Post Acute Care list provided to:: Other (Comment Required) (NA) Choice offered to / list presented to : NA [place] ownership interest in [facility] [place].provided to:: Parent NA    Expected Discharge Plan and Services In-house Referral: NA Discharge Planning Services: CM Consult Post Acute Care Choice: Durable Medical Equipment Living arrangements for the past 2 months: Single Family Home                                      Prior Living Arrangements/Services Living arrangements for the past 2 months: Single Family Home Lives with:: Self Patient language and need for interpreter reviewed:: Yes Do you feel safe going back to the place where you live?: Yes      Need for Family Participation in Patient Care: Yes (Comment) Care giver support system in place?: Yes (comment) Current home services: DME Criminal Activity/Legal Involvement Pertinent to Current Situation/Hospitalization: No - Comment as needed  Activities of Daily Living   ADL Screening (condition at time of admission) Independently performs ADLs?: Yes (appropriate for developmental age) Is the patient deaf or have difficulty hearing?:  No Does the patient have difficulty seeing, even when wearing glasses/contacts?: No Does the patient have difficulty concentrating, remembering, or making decisions?: No  Permission Sought/Granted Permission sought to share information with : Family Supports    Share Information with NAME: Mohini, [place], Emergency Contact  [phone]           Emotional Assessment Appearance:: Appears stated age Attitude/Demeanor/Rapport: Unable to Assess Affect (typically observed): Unable to Assess   Alcohol / Substance Use: Not Applicable Psych Involvement: No (comment)  Admission diagnosis:  Influenza B [J10.1] Acute respiratory failure with hypoxia (HCC) [J96.01] Acute respiratory failure with hypoxia and hypercapnia (HCC) [J96.01, J96.02] Patient Active Problem List   Diagnosis Date Noted   Influenza B 03/16/2024   Sleep apnea 03/16/2024   Acute metabolic encephalopathy 03/16/2024   Influenza A with pneumonia 03/15/2024   Multifocal pneumonia 03/15/2024   Morbid obesity with BMI of 60.0-69.9, adult (HCC) 03/15/2024   Acute respiratory failure with hypoxia and hypercapnia (HCC) 03/14/2024   History of pulmonary embolism 01/03/2024   Acute on chronic diastolic CHF (congestive heart failure) (HCC) 01/02/2024   Exertional shortness of breath 07/08/2023   Hypervolemia 07/08/2023   Pulmonary embolism (HCC) 01/26/2023   OSA (obstructive sleep apnea) 01/07/2023   Essential hypertension 08/19/2022   Hyponatremia 08/18/2022   Class 3 obesity (HCC) 08/16/2022   Non-insulin  dependent type 2 diabetes mellitus (HCC) 08/14/2022   Iron deficiency anemia 08/14/2022   PCP:  Celestia Rosaline SQUIBB,  NP Pharmacy:   [facility] [place] [facility] [address], [address] [place] [address] Phone: [phone] Fax: [phone]  [facility] [address] [place] [address] Phone: [phone] Fax:  [phone]     Social Drivers of Health (SDOH) Social History: SDOH Screenings   Food Insecurity: No Food Insecurity (03/14/2024)  Recent Concern: Food Insecurity - Food Insecurity Present (01/03/2024)  Housing: High Risk (03/14/2024)  Transportation Needs: No Transportation Needs (03/14/2024)  Utilities: At Risk (03/14/2024)  Alcohol Screen: Low Risk (08/18/2022)  Depression (PHQ2-9): Low Risk (11/23/2023)  Financial Resource Strain: High Risk (09/08/2022)  Social Connections: Unknown (01/03/2024)  Tobacco Use: Low Risk (03/14/2024)   SDOH Interventions:     Readmission Risk Interventions    03/17/2024    4:02 PM 01/04/2024   10:11 AM 11/20/2023   12:48 PM  Readmission Risk Prevention Plan  Transportation Screening Complete Complete Complete  PCP or Specialist Appt within 5-7 Days Complete  Complete  Home Care Screening Complete Complete Complete  Medication Review (RN CM) Complete Complete Complete

## 2024-03-17 NOTE — Progress Notes (Signed)
 " PROGRESS NOTE    Christine Singleton  FMW:[id number] DOB: 07/30/1990 DOA: 03/14/2024 PCP: Celestia Rosaline SQUIBB, NP  Brief Narrative: 34 y.o. female with medical history significant of PE on Xarelto , hypertension, diabetes, morbid obesity, cardiomyopathy who presents with shortness of breath.  She states that she has not been feeling well for about a week.  Patient tested positive for Influenza B, chest xray showed bilateral infiltrates required Bipap, 6-8L supplemental O2 admitted to [facility] service for further management and evaluation for acute hypoxic and hypercapnic respiratory failure.   Assessment & Plan:   Principal Problem:   Acute respiratory failure with hypoxia and hypercapnia (HCC) Active Problems:   Acute on chronic diastolic CHF (congestive heart failure) (HCC)   Essential hypertension   History of pulmonary embolism   Non-insulin  dependent type 2 diabetes mellitus (HCC)   OSA (obstructive sleep apnea)   Class 3 obesity (HCC)   Influenza A with pneumonia   Multifocal pneumonia   Morbid obesity with BMI of 60.0-69.9, adult (HCC)   Influenza B   Sleep apnea   Acute metabolic encephalopathy    Acute on chronic hypoxic and hypercapnic respiratory failure-patient was placed on BiPAP again today due to persistent hypercapnia trying to avoid intubation.  Appreciate PCCM.  Although her oxygen requirement came down to 8 L from 15 today.  Urine Legionella and strep pneumo negative MRSA negative.  On Rocephin  and doxycycline  Tamiflu .  Management per PCCM.  Continue Brovana  and Yupelri .   Severe Sepsis secondary to multifocal pneumonia Secondary bacterial infection from influenza B- Presented with tachycardic, tachypneic with respiratory failure. Continue Rocephin , azithromycin  therapy. Continue Tamiflu  5-day therapy. Continue Bipap, supplemental oxygen to maintain saturation greater than 92%.   Acute Metabolic encephalopathy- improving In the setting of hypercarbia. Co2 down to 80  from 102 today She will be on Bipap for now. Continue to monitor Co2 level. Close neurochecks, delirium precautions. High risk for clinical deterioration.   Hypertension: Chronic diastolic CHF- Improved EF 60 to 65%. Continue losartan , Aldactone , torsemide  and Jardiance .   History of PE- Continue Xarelto . CTA negative for PE.   Obstructive sleep apnea on CPAP at night Continue BiPAP for now. Continue PAP device at night with oxygen.   Morbid obesity BMI 62.92. Diet, exercise and weight reduction advised.    Estimated body mass index is 62.92 kg/m as calculated from the following:   Height as of this encounter: 5' 2 (1.575 m).   Weight as of this encounter: 156 kg.  DVT prophylaxis: xarelto  Code Status: full Family Communication:  Disposition Plan:  Status is: Inpatient  Consultants: pccm  Procedures: bipap Antimicrobials:   Subjective: Overnight oxygen requirement improved still with CO2 of 80 though improved on BiPAP  Objective: Vitals:   03/17/24 1030 03/17/24 1100 03/17/24 1130 03/17/24 1200  BP:  125/75  (!) 142/83  Pulse: 89 94  88  Resp: 14 (!) 21  11  Temp:   98.8 F (37.1 C)   TempSrc:   Oral   SpO2: 96% (!) 89%  96%  Weight:      Height:        Intake/Output Summary (Last 24 hours) at 03/17/2024 1300 Last data filed at 03/17/2024 1245 Gross per 24 hour  Intake 1212.09 ml  Output 2850 ml  Net -1637.91 ml   Filed Weights   03/14/24 0858 03/14/24 1754  Weight: (!) 146.1 kg (!) 156 kg    Examination:  General exam: Appears anxious and tearful Respiratory system: Diminished breath sounds Cardiovascular  system: Regular  gastrointestinal system: Abdomen is distended, soft and nontender. No organomegaly or masses felt. Normal bowel sounds heard. Central nervous system: Alert and oriented. No focal neurological deficits. Extremities: trace edema  Data Reviewed: I have personally reviewed following labs and imaging studies  CBC: Recent Labs   Lab 03/14/24 0851 03/14/24 1022 03/14/24 1449 03/15/24 0307 03/17/24 0317  WBC 11.0*  --   --  9.2 6.4  NEUTROABS 8.6*  --   --   --  4.1  HGB 12.9 15.0 15.3* 13.3 13.7  HCT 41.2 44.0 45.0 43.4 45.8  MCV 70.5*  --   --  72.0* 72.7*  PLT 369  --   --  365 330   Basic Metabolic Panel: Recent Labs  Lab 03/14/24 0851 03/14/24 1022 03/14/24 1449 03/15/24 0307 03/17/24 0317  NA 134* 133* 135 138 134*  K 5.1 4.8 4.3 4.3 4.7  CL 94*  --   --  94* 89*  CO2 32  --   --  39* 40*  GLUCOSE 141*  --   --  98 92  BUN 9  --   --  9 11  CREATININE 0.69  --   --  0.78 0.68  CALCIUM 9.0  --   --  9.2 10.1  MG  --   --   --   --  2.5*   GFR: Estimated Creatinine Clearance: 148.8 mL/min (by C-G formula based on SCr of 0.68 mg/dL). Liver Function Tests: Recent Labs  Lab 03/14/24 0851  AST 27  ALT 12  ALKPHOS 127*  BILITOT 0.6  PROT 7.4  ALBUMIN 3.8   No results for input(s): LIPASE, AMYLASE in the last 168 hours. No results for input(s): AMMONIA in the last 168 hours. Coagulation Profile: No results for input(s): INR, PROTIME in the last 168 hours. Cardiac Enzymes: No results for input(s): CKTOTAL, CKMB, CKMBINDEX, TROPONINI in the last 168 hours. BNP (last 3 results) Recent Labs    11/18/23 1612 01/02/24 2152 03/14/24 0851  PROBNP 193.0 1,588.0* 2,173.0*   HbA1C: Recent Labs    03/15/24 0307  HGBA1C 7.1*   CBG: Recent Labs  Lab 03/16/24 1230 03/16/24 1633 03/16/24 2136 03/17/24 0732 03/17/24 1106  GLUCAP 122* 132* 106* 97 138*   Lipid Profile: No results for input(s): CHOL, HDL, LDLCALC, TRIG, CHOLHDL, LDLDIRECT in the last 72 hours. Thyroid Function Tests: No results for input(s): TSH, T4TOTAL, FREET4, T3FREE, THYROIDAB in the last 72 hours. Anemia Panel: No results for input(s): VITAMINB12, FOLATE, FERRITIN, TIBC, IRON, RETICCTPCT in the last 72 hours. Sepsis Labs: Recent Labs  Lab 03/14/24 1119   LATICACIDVEN 0.7    Recent Results (from the past 240 hours)  MRSA Next Gen by PCR, Nasal     Status: None   Collection Time: 03/14/24 12:11 AM   Specimen: Nasal Mucosa; Nasal Swab  Result Value Ref Range Status   MRSA by PCR Next Gen NOT DETECTED NOT DETECTED Final    Comment: (NOTE) The GeneXpert MRSA Assay (FDA approved for NASAL specimens only), is one component of a comprehensive MRSA colonization surveillance program. It is not intended to diagnose MRSA infection nor to guide or monitor treatment for MRSA infections. Test performance is not FDA approved in patients less than 70 years old. Performed at [facility], [address] [place], [place] [address]   Resp panel by RT-PCR (RSV, Flu A&B, Covid) Anterior Nasal Swab     Status: Abnormal   Collection Time: 03/14/24  8:52 AM   Specimen: Anterior Nasal Swab  Result Value Ref Range Status   SARS Coronavirus 2 by RT PCR NEGATIVE NEGATIVE Final    Comment: (NOTE) SARS-CoV-2 target nucleic acids are NOT DETECTED.  The SARS-CoV-2 RNA is generally detectable in upper respiratory specimens during the acute phase of infection. The lowest concentration of SARS-CoV-2 viral copies this assay can detect is 138 copies/mL. A negative result does not preclude SARS-Cov-2 infection and should not be used as the sole basis for treatment or other patient management decisions. A negative result may occur with  improper specimen collection/handling, submission of specimen other than nasopharyngeal swab, presence of viral mutation(s) within the areas targeted by this assay, and inadequate number of viral copies(<138 copies/mL). A negative result must be combined with clinical observations, patient history, and epidemiological information. The expected result is Negative.  Fact Sheet for Patients:  bloggercourse.com  Fact Sheet for Healthcare Providers:   seriousbroker.it  This test is no t yet approved or cleared by the [place]  FDA and  has been authorized for detection and/or diagnosis of SARS-CoV-2 by FDA under an Emergency Use Authorization (EUA). This EUA will remain  in effect (meaning this test can be used) for the duration of the COVID-19 declaration under Section 564(b)(1) of the Act, 21 U.S.C.section 360bbb-3(b)(1), unless the authorization is terminated  or revoked sooner.       Influenza A by PCR NEGATIVE NEGATIVE Final   Influenza B by PCR POSITIVE (A) NEGATIVE Final    Comment: (NOTE) The Xpert Xpress SARS-CoV-2/FLU/RSV plus assay is intended as an aid in the diagnosis of influenza from Nasopharyngeal swab specimens and should not be used as a sole basis for treatment. Nasal washings and aspirates are unacceptable for Xpert Xpress SARS-CoV-2/FLU/RSV testing.  Fact Sheet for Patients: bloggercourse.com  Fact Sheet for Healthcare Providers: seriousbroker.it  This test is not yet approved or cleared by the [place]  FDA and has been authorized for detection and/or diagnosis of SARS-CoV-2 by FDA under an Emergency Use Authorization (EUA). This EUA will remain in effect (meaning this test can be used) for the duration of the COVID-19 declaration under Section 564(b)(1) of the Act, 21 U.S.C. section 360bbb-3(b)(1), unless the authorization is terminated or revoked.     Resp Syncytial Virus by PCR NEGATIVE NEGATIVE Final    Comment: (NOTE) Fact Sheet for Patients: bloggercourse.com  Fact Sheet for Healthcare Providers: seriousbroker.it  This test is not yet approved or cleared by the [place]  FDA and has been authorized for detection and/or diagnosis of SARS-CoV-2 by FDA under an Emergency Use Authorization (EUA). This EUA will remain in effect (meaning this test can be used)  for the duration of the COVID-19 declaration under Section 564(b)(1) of the Act, 21 U.S.C. section 360bbb-3(b)(1), unless the authorization is terminated or revoked.  Performed at [facility], [address] [place], [place] [address]   Blood culture (routine x 2)     Status: None (Preliminary result)   Collection Time: 03/14/24 10:15 AM   Specimen: BLOOD  Result Value Ref Range Status   Specimen Description   Final    BLOOD RIGHT ANTECUBITAL Performed at [facility], [address] [place], [place] [address]    Special Requests   Final    BOTTLES DRAWN AEROBIC AND ANAEROBIC Blood Culture results may not be optimal due to an inadequate volume of blood received in culture bottles Performed at [facility], [address] [place], [place] [address]    Culture   Final    NO GROWTH 3 DAYS Performed at [facility], [address], [place], [place] [address]    Report Status PENDING  Incomplete  Blood culture (routine x 2)     Status: None (Preliminary result)   Collection Time: 03/14/24 10:20 AM   Specimen: BLOOD  Result Value Ref Range Status   Specimen Description   Final    BLOOD BLOOD LEFT HAND Performed at [facility], [address] [place], [place] [address]    Special Requests   Final    BOTTLES DRAWN AEROBIC AND ANAEROBIC Blood Culture adequate volume Performed at [facility], [address] [place], [place] [address]    Culture   Final    NO GROWTH 3 DAYS Performed at [facility], [address] [place], [place] [address]    Report Status PENDING  Incomplete     Radiology Studies: No results found.   Scheduled Meds:  arformoterol   15 mcg Nebulization BID   Chlorhexidine  Gluconate Cloth  6 each Topical Daily   empagliflozin   10 mg Oral Daily   guaiFENesin   1,200 mg Oral BID   insulin  aspart  0-15 Units Subcutaneous TID WC   losartan   25 mg Oral QPM   methylPREDNISolone   (SOLU-MEDROL ) injection  40 mg Intravenous Daily   mouth rinse  15 mL Mouth Rinse 4 times per day   oseltamivir   75 mg Oral BID   revefenacin   175 mcg Nebulization Daily   rivaroxaban   20 mg Oral QPC supper   sodium chloride  flush  3 mL Intravenous Q12H   sodium chloride  flush  3 mL Intravenous Q12H   spironolactone   25 mg Oral QPM   torsemide   20 mg Oral Daily   Continuous Infusions:  cefTRIAXone  (ROCEPHIN )  IV Stopped (03/17/24 0944)   doxycycline  (VIBRAMYCIN ) IV 125 mL/hr at 03/17/24 1200   promethazine  (PHENERGAN ) injection (IM or IVPB) Stopped (03/15/24 1337)     LOS: 3 days    Almarie KANDICE Hoots, MD  03/17/2024, 1:00 PM   "

## 2024-03-17 NOTE — Progress Notes (Signed)
 PT Cancellation Note  Patient Details Name: Christine Singleton MRN: [id number] DOB: November 23, 1990   Cancelled Treatment:    Reason Eval/Treat Not Completed: Medical issues which prohibited therapy; per RN pt remains on bipap and not able to come off today.  Will attempt another day.   Montie Portal 03/17/2024, 11:05 AM Micheline Portal, PT Acute Rehabilitation Services Office:[phone] 03/17/2024

## 2024-03-18 DIAGNOSIS — Z87891 Personal history of nicotine dependence: Secondary | ICD-10-CM

## 2024-03-18 DIAGNOSIS — J9621 Acute and chronic respiratory failure with hypoxia: Secondary | ICD-10-CM | POA: Diagnosis not present

## 2024-03-18 DIAGNOSIS — I5032 Chronic diastolic (congestive) heart failure: Secondary | ICD-10-CM

## 2024-03-18 DIAGNOSIS — Z86711 Personal history of pulmonary embolism: Secondary | ICD-10-CM | POA: Diagnosis not present

## 2024-03-18 DIAGNOSIS — G4733 Obstructive sleep apnea (adult) (pediatric): Secondary | ICD-10-CM | POA: Diagnosis not present

## 2024-03-18 DIAGNOSIS — I110 Hypertensive heart disease with heart failure: Secondary | ICD-10-CM | POA: Diagnosis not present

## 2024-03-18 DIAGNOSIS — J9602 Acute respiratory failure with hypercapnia: Secondary | ICD-10-CM | POA: Diagnosis not present

## 2024-03-18 DIAGNOSIS — J1001 Influenza due to other identified influenza virus with the same other identified influenza virus pneumonia: Secondary | ICD-10-CM | POA: Diagnosis not present

## 2024-03-18 DIAGNOSIS — J9601 Acute respiratory failure with hypoxia: Secondary | ICD-10-CM | POA: Diagnosis not present

## 2024-03-18 DIAGNOSIS — J1000 Influenza due to other identified influenza virus with unspecified type of pneumonia: Secondary | ICD-10-CM

## 2024-03-18 DIAGNOSIS — E119 Type 2 diabetes mellitus without complications: Secondary | ICD-10-CM | POA: Diagnosis not present

## 2024-03-18 DIAGNOSIS — J9622 Acute and chronic respiratory failure with hypercapnia: Secondary | ICD-10-CM | POA: Diagnosis not present

## 2024-03-18 LAB — COMPREHENSIVE METABOLIC PANEL WITH GFR
ALT: 11 U/L (ref 0–44)
AST: 26 U/L (ref 15–41)
Albumin: 3.5 g/dL (ref 3.5–5.0)
Alkaline Phosphatase: 121 U/L (ref 38–126)
Anion gap: 7 (ref 5–15)
BUN: 16 mg/dL (ref 6–20)
CO2: 40 mmol/L — ABNORMAL HIGH (ref 22–32)
Calcium: 9.6 mg/dL (ref 8.9–10.3)
Chloride: 88 mmol/L — ABNORMAL LOW (ref 98–111)
Creatinine, Ser: 0.59 mg/dL (ref 0.44–1.00)
GFR, Estimated: >60 mL/min
Glucose, Bld: 72 mg/dL (ref 70–99)
Potassium: 4.4 mmol/L (ref 3.5–5.1)
Sodium: 135 mmol/L (ref 135–145)
Total Bilirubin: 0.4 mg/dL (ref 0.0–1.2)
Total Protein: 7.6 g/dL (ref 6.5–8.1)

## 2024-03-18 LAB — CBC
HCT: 45.8 % (ref 36.0–46.0)
Hemoglobin: 14 g/dL (ref 12.0–15.0)
MCH: 22 pg — ABNORMAL LOW (ref 26.0–34.0)
MCHC: 30.6 g/dL (ref 30.0–36.0)
MCV: 71.9 fL — ABNORMAL LOW (ref 80.0–100.0)
Platelets: 325 K/uL (ref 150–400)
RBC: 6.37 MIL/uL — ABNORMAL HIGH (ref 3.87–5.11)
RDW: 20.4 % — ABNORMAL HIGH (ref 11.5–15.5)
WBC: 8.6 K/uL (ref 4.0–10.5)
nRBC: 0 % (ref 0.0–0.2)

## 2024-03-18 LAB — GLUCOSE, CAPILLARY
Glucose-Capillary: 115 mg/dL — ABNORMAL HIGH (ref 70–99)
Glucose-Capillary: 238 mg/dL — ABNORMAL HIGH (ref 70–99)
Glucose-Capillary: 232 mg/dL — ABNORMAL HIGH (ref 70–99)
Glucose-Capillary: 132 mg/dL — ABNORMAL HIGH (ref 70–99)

## 2024-03-18 MED ORDER — SODIUM CHLORIDE 0.9% FLUSH: 10.0000 mL | INTRAVENOUS | Status: DC | PRN

## 2024-03-18 MED ORDER — SALINE SPRAY 0.65 % NA SOLN
1.0000 | NASAL | Status: DC | PRN
  Filled 2024-03-18: qty 44

## 2024-03-18 MED ORDER — CHLORHEXIDINE GLUCONATE CLOTH 2 % EX PADS
6.0000 | MEDICATED_PAD | Freq: Every day | CUTANEOUS | Status: DC
  Administered 2024-03-18 – 2024-03-20 (×3): 6 via TOPICAL

## 2024-03-18 MED ORDER — SODIUM CHLORIDE 0.9% FLUSH
10.0000 mL | Freq: Two times a day (BID) | INTRAVENOUS | Status: DC
  Administered 2024-03-18 – 2024-03-22 (×5): 10 mL

## 2024-03-18 NOTE — Progress Notes (Signed)
" °   03/18/24 2351  BiPAP/CPAP/SIPAP  BiPAP/CPAP/SIPAP Pt Type Adult  BiPAP/CPAP/SIPAP SERVO  Mask Type Full face mask  Dentures removed? Not applicable  Mask Size Medium  Set Rate 22 breaths/min  Respiratory Rate 22 breaths/min  IPAP 24 cmH20  EPAP 10 cmH2O  Pressure Support 14 cmH20 (niv pc)  PEEP 10 cmH20  FiO2 (%) 40 %  Minute Ventilation 7.9  Leak 24  Peak Inspiratory Pressure (PIP) 24  Tidal Volume (Vt) 583  Patient Home Machine No  Patient Home Mask No  Patient Home Tubing No  Auto Titrate No  Press High Alarm 35 cmH2O  CPAP/SIPAP surface wiped down Yes  Device Plugged into RED Power Outlet Yes  BiPAP/CPAP /SiPAP Vitals  Pulse Rate 93  Resp (!) 22  SpO2 99 %  MEWS Score/Color  MEWS Score 1  MEWS Score Color Green    "

## 2024-03-18 NOTE — Progress Notes (Addendum)
 PHARMACY - PHYSICIAN COMMUNICATION CRITICAL VALUE ALERT - BLOOD CULTURE IDENTIFICATION (BCID)  Christine Singleton is an 34 y.o. 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  [name], [name] 03/18/2024  9:05 PM   Addendum: 2nd anaerobic bottle now also growing GPR.  Continue current regimen and monitor.   [name], PharmD, BCPS 03/19/2024@4 :57 AM

## 2024-03-18 NOTE — Progress Notes (Signed)
 " PROGRESS NOTE    Christine Singleton  FMW:[id number] DOB: [birth year] DOA: 03/14/2024 PCP: Celestia Rosaline SQUIBB, NP  Brief Narrative: 34 y.o. female with medical history significant of PE on Xarelto , hypertension, diabetes, morbid obesity, cardiomyopathy who presents with shortness of breath.  She states that she has not been feeling well for about a week.  Patient tested positive for Influenza B, chest xray showed bilateral infiltrates required Bipap, 6-8L supplemental O2 admitted to TRH service for further management and evaluation for acute hypoxic and hypercapnic respiratory failure.   Assessment & Plan:   Principal Problem:   Acute respiratory failure with hypoxia and hypercapnia (HCC) Active Problems:   Acute on chronic diastolic CHF (congestive heart failure) (HCC)   Essential hypertension   History of pulmonary embolism   Non-insulin  dependent type 2 diabetes mellitus (HCC)   OSA (obstructive sleep apnea)   Class 3 obesity (HCC)   Influenza A with pneumonia   Multifocal pneumonia   Morbid obesity with BMI of 60.0-69.9, adult (HCC)   Influenza B   Sleep apnea   Acute metabolic encephalopathy    Acute on chronic hypoxic and hypercapnic respiratory failure-patient tolerated BiPAP for few hours last night and then she took it off as she started coughing violently.  She does have a CPAP at home which she says he uses almost every night ?  Patient was placed on BiPAP again today due to persistent hypercapnia trying to avoid intubation.  Appreciate PCCM.  Although her oxygen requirement came down to 8 L from 15 today.  Urine Legionella and strep pneumo negative MRSA negative.  On Rocephin  and doxycycline  Tamiflu .  Management per PCCM.  Continue Brovana  and Yupelri .   Severe Sepsis secondary to multifocal pneumonia Secondary bacterial infection from influenza B- Presented with tachycardic, tachypneic with respiratory failure. Continue Rocephin , azithromycin  therapy. Continue Tamiflu   5-day therapy. Continue Bipap, supplemental oxygen to maintain saturation greater than 92%.   Acute Metabolic encephalopathy- improving In the setting of hypercarbia. Co2 down to 80 from 102  She will be on Bipap for now. Continue to monitor Co2 level. Close neurochecks, delirium precautions. High risk for clinical deterioration.   Hypertension: Chronic diastolic CHF- Improved EF 60 to 65%. Continue losartan , Aldactone , torsemide  and Jardiance .   History of PE- Continue Xarelto . CTA negative for PE.   Obstructive sleep apnea on CPAP at night Continue BiPAP for now. Continue PAP device at night with oxygen.   Morbid obesity BMI 62.92. Diet, exercise and weight reduction advised.    Estimated body mass index is 62.92 kg/m as calculated from the following:   Height as of this encounter: 5' 2 (1.575 m).   Weight as of this encounter: 156 kg.  DVT prophylaxis: xarelto  Code Status: full Family Communication:  Disposition Plan:  Status is: Inpatient  Consultants: pccm  Procedures: bipap Antimicrobials:   Subjective: No acute events overnight she tolerated BiPAP for few hours and then took it off on 6 L of oxygen currently uses CPAP at home unclear if she is using it every night Objective: Vitals:   03/18/24 1200 03/18/24 1222 03/18/24 1257 03/18/24 1300  BP:  112/66  (!) 116/56  Pulse: 94   97  Resp: (!) 22 13  (!) 21  Temp:   98.1 F (36.7 C)   TempSrc:   Oral   SpO2: 97%   92%  Weight:      Height:        Intake/Output Summary (Last 24 hours) at 03/18/2024 1356 Last  data filed at 03/18/2024 1209 Gross per 24 hour  Intake 2155.21 ml  Output 1400 ml  Net 755.21 ml   Filed Weights   03/14/24 0858 03/14/24 1754  Weight: (!) 146.1 kg (!) 156 kg    Examination:  General exam: Appears anxious and tearful Respiratory system: Diminished breath sounds Cardiovascular system: Regular  gastrointestinal system: Abdomen is distended, soft and nontender. No  organomegaly or masses felt. Normal bowel sounds heard. Central nervous system: Alert and oriented. No focal neurological deficits. Extremities: trace edema  Data Reviewed: I have personally reviewed following labs and imaging studies  CBC: Recent Labs  Lab 03/14/24 0851 03/14/24 1022 03/14/24 1449 03/15/24 0307 03/17/24 0317 03/18/24 0226  WBC 11.0*  --   --  9.2 6.4 8.6  NEUTROABS 8.6*  --   --   --  4.1  --   HGB 12.9 15.0 15.3* 13.3 13.7 14.0  HCT 41.2 44.0 45.0 43.4 45.8 45.8  MCV 70.5*  --   --  72.0* 72.7* 71.9*  PLT 369  --   --  365 330 325   Basic Metabolic Panel: Recent Labs  Lab 03/14/24 0851 03/14/24 1022 03/14/24 1449 03/15/24 0307 03/17/24 0317 03/18/24 0226  NA 134* 133* 135 138 134* 135  K 5.1 4.8 4.3 4.3 4.7 4.4  CL 94*  --   --  94* 89* 88*  CO2 32  --   --  39* 40* 40*  GLUCOSE 141*  --   --  98 92 72  BUN 9  --   --  9 11 16   CREATININE 0.69  --   --  0.78 0.68 0.59  CALCIUM 9.0  --   --  9.2 10.1 9.6  MG  --   --   --   --  2.5*  --    GFR: Estimated Creatinine Clearance: 148.8 mL/min (by C-G formula based on SCr of 0.59 mg/dL). Liver Function Tests: Recent Labs  Lab 03/14/24 0851 03/18/24 0226  AST 27 26  ALT 12 11  ALKPHOS 127* 121  BILITOT 0.6 0.4  PROT 7.4 7.6  ALBUMIN 3.8 3.5   No results for input(s): LIPASE, AMYLASE in the last 168 hours. No results for input(s): AMMONIA in the last 168 hours. Coagulation Profile: No results for input(s): INR, PROTIME in the last 168 hours. Cardiac Enzymes: No results for input(s): CKTOTAL, CKMB, CKMBINDEX, TROPONINI in the last 168 hours. BNP (last 3 results) Recent Labs    11/18/23 1612 01/02/24 2152 03/14/24 0851  PROBNP 193.0 1,588.0* 2,173.0*   HbA1C: No results for input(s): HGBA1C in the last 72 hours.  CBG: Recent Labs  Lab 03/17/24 1106 03/17/24 1645 03/17/24 2207 03/18/24 0754 03/18/24 1212  GLUCAP 138* 180* 175* 115* 238*   Lipid Profile: No  results for input(s): CHOL, HDL, LDLCALC, TRIG, CHOLHDL, LDLDIRECT in the last 72 hours. Thyroid Function Tests: No results for input(s): TSH, T4TOTAL, FREET4, T3FREE, THYROIDAB in the last 72 hours. Anemia Panel: No results for input(s): VITAMINB12, FOLATE, FERRITIN, TIBC, IRON, RETICCTPCT in the last 72 hours. Sepsis Labs: Recent Labs  Lab 03/14/24 1119  LATICACIDVEN 0.7    Recent Results (from the past 240 hours)  MRSA Next Gen by PCR, Nasal     Status: None   Collection Time: 03/14/24 12:11 AM   Specimen: Nasal Mucosa; Nasal Swab  Result Value Ref Range Status   MRSA by PCR Next Gen NOT DETECTED NOT DETECTED Final    Comment: (NOTE) The  GeneXpert MRSA Assay (FDA approved for NASAL specimens only), is one component of a comprehensive MRSA colonization surveillance program. It is not intended to diagnose MRSA infection nor to guide or monitor treatment for MRSA infections. Test performance is not FDA approved in patients less than 33 years old. Performed at [facility], [address], [place], [place] [address]   Resp panel by RT-PCR (RSV, Flu A&B, Covid) Anterior Nasal Swab     Status: Abnormal   Collection Time: 03/14/24  8:52 AM   Specimen: Anterior Nasal Swab  Result Value Ref Range Status   SARS Coronavirus 2 by RT PCR NEGATIVE NEGATIVE Final    Comment: (NOTE) SARS-CoV-2 target nucleic acids are NOT DETECTED.  The SARS-CoV-2 RNA is generally detectable in upper respiratory specimens during the acute phase of infection. The lowest concentration of SARS-CoV-2 viral copies this assay can detect is 138 copies/mL. A negative result does not preclude SARS-Cov-2 infection and should not be used as the sole basis for treatment or other patient management decisions. A negative result may occur with  improper specimen collection/handling, submission of specimen other than nasopharyngeal swab, presence of viral mutation(s)  within the areas targeted by this assay, and inadequate number of viral copies(<138 copies/mL). A negative result must be combined with clinical observations, patient history, and epidemiological information. The expected result is Negative.  Fact Sheet for Patients:  bloggercourse.com  Fact Sheet for Healthcare Providers:  seriousbroker.it  This test is no t yet approved or cleared by the [place]  FDA and  has been authorized for detection and/or diagnosis of SARS-CoV-2 by FDA under an Emergency Use Authorization (EUA). This EUA will remain  in effect (meaning this test can be used) for the duration of the COVID-19 declaration under Section 564(b)(1) of the Act, 21 U.S.C.section 360bbb-3(b)(1), unless the authorization is terminated  or revoked sooner.       Influenza A by PCR NEGATIVE NEGATIVE Final   Influenza B by PCR POSITIVE (A) NEGATIVE Final    Comment: (NOTE) The Xpert Xpress SARS-CoV-2/FLU/RSV plus assay is intended as an aid in the diagnosis of influenza from Nasopharyngeal swab specimens and should not be used as a sole basis for treatment. Nasal washings and aspirates are unacceptable for Xpert Xpress SARS-CoV-2/FLU/RSV testing.  Fact Sheet for Patients: bloggercourse.com  Fact Sheet for Healthcare Providers: seriousbroker.it  This test is not yet approved or cleared by the [place]  FDA and has been authorized for detection and/or diagnosis of SARS-CoV-2 by FDA under an Emergency Use Authorization (EUA). This EUA will remain in effect (meaning this test can be used) for the duration of the COVID-19 declaration under Section 564(b)(1) of the Act, 21 U.S.C. section 360bbb-3(b)(1), unless the authorization is terminated or revoked.     Resp Syncytial Virus by PCR NEGATIVE NEGATIVE Final    Comment: (NOTE) Fact Sheet for  Patients: bloggercourse.com  Fact Sheet for Healthcare Providers: seriousbroker.it  This test is not yet approved or cleared by the [place]  FDA and has been authorized for detection and/or diagnosis of SARS-CoV-2 by FDA under an Emergency Use Authorization (EUA). This EUA will remain in effect (meaning this test can be used) for the duration of the COVID-19 declaration under Section 564(b)(1) of the Act, 21 U.S.C. section 360bbb-3(b)(1), unless the authorization is terminated or revoked.  Performed at [facility], [address] [place], [place] [address]   Blood culture (routine x 2)     Status: None (Preliminary result)  Collection Time: 03/14/24 10:15 AM   Specimen: BLOOD  Result Value Ref Range Status   Specimen Description   Final    BLOOD RIGHT ANTECUBITAL Performed at [facility], [address] [place], [place] [address]    Special Requests   Final    BOTTLES DRAWN AEROBIC AND ANAEROBIC Blood Culture results may not be optimal due to an inadequate volume of blood received in culture bottles Performed at [facility], [address] [place], [place] [address]    Culture   Final    NO GROWTH 4 DAYS Performed at [facility], [address] [place], [place] [address]    Report Status PENDING  Incomplete  Blood culture (routine x 2)     Status: None (Preliminary result)   Collection Time: 03/14/24 10:20 AM   Specimen: BLOOD  Result Value Ref Range Status   Specimen Description   Final    BLOOD BLOOD LEFT HAND Performed at [facility], [address] [place], [place] [address]    Special Requests   Final    BOTTLES DRAWN AEROBIC AND ANAEROBIC Blood Culture adequate volume Performed at [facility], [address] [place], [place] [address]    Culture   Final    NO GROWTH 4 DAYS Performed at [facility], [address]  [place], [place] [address]    Report Status PENDING  Incomplete     Radiology Studies: No results found.   Scheduled Meds:  arformoterol   15 mcg Nebulization BID   Chlorhexidine  Gluconate Cloth  6 each Topical Q2200   empagliflozin   10 mg Oral Daily   Gerhardt's butt cream   Topical BID   guaiFENesin   1,200 mg Oral BID   insulin  aspart  0-15 Units Subcutaneous TID WC   losartan   25 mg Oral QPM   methylPREDNISolone  (SOLU-MEDROL ) injection  40 mg Intravenous Daily   mouth rinse  15 mL Mouth Rinse 4 times per day   oseltamivir   75 mg Oral BID   revefenacin   175 mcg Nebulization Daily   rivaroxaban   20 mg Oral QPC supper   sodium chloride  flush  3 mL Intravenous Q12H   sodium chloride  flush  3 mL Intravenous Q12H   spironolactone   25 mg Oral QPM   torsemide   20 mg Oral Daily   Continuous Infusions:  cefTRIAXone  (ROCEPHIN )  IV Stopped (03/18/24 0955)   doxycycline  (VIBRAMYCIN ) IV Stopped (03/18/24 1200)   promethazine  (PHENERGAN ) injection (IM or IVPB) Stopped (03/15/24 1337)     LOS: 4 days    Christine KANDICE Hoots, MD  03/18/2024, 1:56 PM   "

## 2024-03-18 NOTE — Evaluation (Signed)
 Physical Therapy Evaluation Patient Details Name: Christine Singleton MRN: [id number] DOB: 08/02/1990 Today's Date: 03/18/2024  History of Present Illness  34 yo female presents to therapy following hospital admission on 03/14/2024 due to SOB, productive cough and found to have acute hypoxic and hypercapnic respiratory failure with sepsis secondary to influenza B and PNA as well as acute metabolic encephalopathy. Pt PMH includes but is not limited to: obesity, PE, DM II, HTN, cardiomyopathy, dCHF, home O2 and OSA with CPAP.  Clinical Impression      Pt admitted with above diagnosis.  Pt currently with functional limitations due to the deficits listed below (see PT Problem List). PT arrived and pt seated on [facility] with nurse present. Pt agreeable to therapy intervention. Pt on supplemental O2 6 L/min via [place] from mobile tank, pt indicates intermittent use of supplemental O2 in home setting at 3 L/min, pt on 5 L/min at rest via [place] with concentrator in hospital setting and O2 saturation throughout evaluation >/=96% and no reports nor apparent signs or symptoms of SOB. Pt required SBA for sit to stand  from [facility] x 2 and CGA for safety with SPT BSC to recliner. Pt reported feeling fatigued and lunch arrived, PT anticipates gait assessment at next intervention. Pt left seated in recliner, all needs in place on 5 L/min. Pt will benefit from acute skilled PT to increase their independence and safety with mobility to allow discharge.       If plan is discharge home, recommend the following:     Can travel by private vehicle        Equipment Recommendations None recommended by PT  Recommendations for Other Services       Functional Status Assessment Patient has had a recent decline in their functional status and demonstrates the ability to make significant improvements in function in a reasonable and predictable amount of time.     Precautions / Restrictions Precautions Precautions: Fall (monitor  O2) Restrictions Weight Bearing Restrictions Per Provider Order: No      Mobility  Bed Mobility               General bed mobility comments: pt was received seated on bedside commode at the start of the session    Transfers Overall transfer level: Needs assistance Equipment used: None Transfers: Sit to/from Stand, Bed to chair/wheelchair/BSC Sit to Stand: Supervision   Step pivot transfers: Supervision       General transfer comment: min cues, wide BOS, A for line/lead management no overt LOB, STS x 4 for assist with hygiene tasks    Ambulation/Gait               General Gait Details: NT at time of eval  Stairs            Wheelchair Mobility     Tilt Bed    Modified Rankin (Stroke Patients Only)       Balance Overall balance assessment: No apparent balance deficits (not formally assessed) (static standing no UE support wide BOS)   Sitting balance-Leahy Scale: Good       Standing balance-Leahy Scale: Good                               Pertinent Vitals/Pain Pain Assessment Pain Assessment: No/denies pain    Home Living Family/patient expects to be discharged to:: Private residence Living Arrangements: Alone   Type of Home: House Home Access: Stairs to enter  Entrance Stairs-Rails: Left Entrance Stairs-Number of Steps: 6-8   Home Layout: One level Home Equipment: None Additional Comments: CPAP and O2    Prior Function Prior Level of Function : Independent/Modified Independent             Mobility Comments: Independent with ambulation. ADLs Comments: Independent with ADLs, cooking, and cleaning.     Extremity/Trunk Assessment   Upper Extremity Assessment Upper Extremity Assessment: Defer to OT evaluation    Lower Extremity Assessment Lower Extremity Assessment: Overall WFL for tasks assessed    Cervical / Trunk Assessment Cervical / Trunk Assessment: Normal  Communication    Communication Communication: No apparent difficulties    Cognition Arousal: Alert Behavior During Therapy: WFL for tasks assessed/performed   PT - Cognitive impairments: No apparent impairments                         Following commands: Intact       Cueing Cueing Techniques: Verbal cues     General Comments      Exercises     Assessment/Plan    PT Assessment Patient needs continued PT services  PT Problem List Decreased activity tolerance;Cardiopulmonary status limiting activity       PT Treatment Interventions Gait training;Stair training;Functional mobility training;Therapeutic activities;Therapeutic exercise;Balance training;Neuromuscular re-education;Patient/family education    PT Goals (Current goals can be found in the Care Plan section)  Acute Rehab PT Goals Patient Stated Goal: to get stronger and go home with family support PT Goal Formulation: With patient Time For Goal Achievement: 04/01/24 Potential to Achieve Goals: Good    Frequency Min 3X/week     Co-evaluation PT/OT/SLP Co-Evaluation/Treatment: Yes Reason for Co-Treatment: To address functional/ADL transfers PT goals addressed during session: Mobility/safety with mobility OT goals addressed during session: ADL's and self-care       AM-PAC PT 6 Clicks Mobility  Outcome Measure Help needed turning from your back to your side while in a flat bed without using bedrails?: A Little Help needed moving from lying on your back to sitting on the side of a flat bed without using bedrails?: A Little Help needed moving to and from a bed to a chair (including a wheelchair)?: A Little Help needed standing up from a chair using your arms (e.g., wheelchair or bedside chair)?: A Little Help needed to walk in hospital room?: Total Help needed climbing 3-5 steps with a railing? : Total 6 Click Score: 14    End of Session Equipment Utilized During Treatment: Oxygen Activity Tolerance: Patient  limited by fatigue Patient left: in chair;with call bell/phone within reach Nurse Communication: Mobility status PT Visit Diagnosis: Unsteadiness on feet (R26.81);Difficulty in walking, not elsewhere classified (R26.2)    Time: 8964-8895 PT Time Calculation (min) (ACUTE ONLY): 29 min   Charges:   PT Evaluation $PT Eval Low Complexity: 1 Low   PT General Charges $$ ACUTE PT VISIT: 1 Visit         [place], PT Acute Rehab   [place] VEAR Drone 03/18/2024, 2:39 PM

## 2024-03-18 NOTE — Evaluation (Signed)
 Occupational Therapy Evaluation Patient Details Name: Christine Singleton MRN: [id number] DOB: 03/05/1991 Today's Date: 03/18/2024   History of Present Illness   Christine Singleton is a 34 yr old female admitted to the hospital with shortness of breath and malaise. She was found to have severe spesis due to multi-focal PNA, acute respiratory failure, and acute metabolic encephalopathy. PMH: PE, HTN, DM, obesity, cardiomyopathy, sleep apnea     Clinical Impressions The pt is currently presenting below her baseline level of functioning for self-care management. She is limited by the below listed deficits (see OT problem list). During the session today, she required SBA for sit to stand transfers and min assist for toileting management at bedside commode level. She was noted to be on 6L O2 via high flow nasal cannula. OT will continue to follow the pt for services in the hospital setting, to maximize her independence with self care tasks and to facilitate her safe discharge from the hospital. No post-acute OT needs anticipated.      If plan is discharge home, recommend the following:   Help with stairs or ramp for entrance;Assist for transportation;A little help with bathing/dressing/bathroom     Functional Status Assessment   Patient has had a recent decline in their functional status and demonstrates the ability to make significant improvements in function in a reasonable and predictable amount of time.     Equipment Recommendations   None recommended by OT     Recommendations for Other Services         Precautions/Restrictions   Restrictions Weight Bearing Restrictions Per Provider Order: No     Mobility Bed Mobility               General bed mobility comments: pt was received seated on bedside commode at the start of the session    Transfers Overall transfer level: Needs assistance Equipment used: None Transfers: Sit to/from Stand, Bed to chair/wheelchair/BSC Sit to  Stand: Supervision     Step pivot transfers: Supervision            Balance     Sitting balance-Leahy Scale: Good       Standing balance-Leahy Scale: Good         ADL either performed or assessed with clinical judgement   ADL Overall ADL's : Needs assistance/impaired Eating/Feeding: Independent;Sitting   Grooming: Set up;Sitting   Upper Body Bathing: Set up;Sitting   Lower Body Bathing: Minimal assistance;Sit to/from stand   Upper Body Dressing : Set up;Sitting   Lower Body Dressing: Minimal assistance;Sitting/lateral leans;Sit to/from stand Lower Body Dressing Details (indicate cue type and reason): She required assist to donn her underwear over her ankles in sitting. Toilet Transfer: Supervision/safety;BSC/3in1;Stand-pivot   Psychiatrist and Hygiene: Minimal assistance;Sit to/from stand Toileting - Clothing Manipulation Details (indicate cue type and reason): She required assist for toileting management at bedside commode level.                          Pertinent Vitals/Pain Pain Assessment Pain Assessment: No/denies pain     Extremity/Trunk Assessment Upper Extremity Assessment Upper Extremity Assessment: Overall WFL for tasks assessed;Right hand dominant   Lower Extremity Assessment Lower Extremity Assessment: Overall WFL for tasks assessed       Communication Communication Communication: No apparent difficulties   Cognition Arousal: Alert Behavior During Therapy: WFL for tasks assessed/performed               OT - Cognition Comments:  Oriented x4                 Following commands: Intact       Cueing  General Comments   Cueing Techniques: Verbal cues              Home Living Family/patient expects to be discharged to:: Private residence Living Arrangements: Alone   Type of Home: House Home Access: Stairs to enter [employer] of Steps: 6-8 Entrance Stairs-Rails: Left Home  Layout: One level     Bathroom Shower/Tub: Tub/shower unit             Additional Comments: CPAP and O2      Prior Functioning/Environment Prior Level of Function : Independent/Modified Independent             Mobility Comments: Independent with ambulation. ADLs Comments: Independent with ADLs, cooking, and cleaning.    OT Problem List: Decreased strength;Decreased activity tolerance;Decreased knowledge of use of DME or AE;Cardiopulmonary status limiting activity   OT Treatment/Interventions: Self-care/ADL training;Therapeutic exercise;Energy conservation;DME and/or AE instruction;Therapeutic activities;Balance training;Patient/family education      OT Goals(Current goals can be found in the care plan section)   Acute Rehab OT Goals OT Goal Formulation: With patient Time For Goal Achievement: 04/01/24 Potential to Achieve Goals: Good ADL Goals Pt Will Perform Grooming: with modified independence;standing Pt Will Perform Lower Body Dressing: with modified independence;sit to/from stand Pt Will Transfer to Toilet: with modified independence;ambulating Pt Will Perform Toileting - Clothing Manipulation and hygiene: with modified independence;sit to/from stand   OT Frequency:  Min 1X/week    Co-evaluation PT/OT/SLP Co-Evaluation/Treatment: Yes Reason for Co-Treatment: To address functional/ADL transfers PT goals addressed during session: Mobility/safety with mobility OT goals addressed during session: ADL's and self-care      AM-PAC OT 6 Clicks Daily Activity     Outcome Measure Help from another person eating meals?: None Help from another person taking care of personal grooming?: A Little Help from another person toileting, which includes using toliet, bedpan, or urinal?: A Little Help from another person bathing (including washing, rinsing, drying)?: A Little Help from another person to put on and taking off regular upper body clothing?: A Little Help from  another person to put on and taking off regular lower body clothing?: A Little 6 Click Score: 19   End of Session Equipment Utilized During Treatment: Oxygen Nurse Communication: Mobility status  Activity Tolerance: Patient tolerated treatment well Patient left: in chair;with call bell/phone within reach  OT Visit Diagnosis: Muscle weakness (generalized) (M62.81)                Time: 8964-8895 OT Time Calculation (min): 29 min Charges:  OT General Charges $OT Visit: 1 Visit OT Evaluation $OT Eval Moderate Complexity: 1 Mod    Sinda Leedom J Harris,OTR/L  03/18/2024, 1:50 PM

## 2024-03-18 NOTE — Plan of Care (Signed)
" °  Problem: Nutrition: Goal: Adequate nutrition will be maintained Outcome: Progressing   Problem: Pain Managment: Goal: General experience of comfort will improve and/or be controlled Outcome: Progressing   Problem: Skin Integrity: Goal: Risk for impaired skin integrity will decrease Outcome: Progressing   Problem: Clinical Measurements: Goal: Respiratory complications will improve Outcome: Not Progressing   Problem: Activity: Goal: Risk for activity intolerance will decrease Outcome: Not Progressing   "

## 2024-03-18 NOTE — Progress Notes (Signed)
 "  NAME:  Christine Singleton, MRN:  [id number], DOB:  [birth year], LOS: 4 ADMISSION DATE:  03/14/2024, CONSULTATION DATE:  12/29 REFERRING MD:  Dr. Darci, CHIEF COMPLAINT:  acute on chronic hypercapnic and hypoxic respiratory failure, Influenza B, Pneumonia    History of Present Illness:  Christine Singleton is a 34 y.o. female with PMH of OSA on CPAP at home with PRN supplemental oxygen, pulmonary embolism on xarelto  ,HTN, diabetes, chronic diastolic CHF who presented to the ED 12/28 for shortness of breath for about 1 week. Patient with exposures to Influenza at home. Patient was found to be hypoxic in the ED and placed on increasing amounts of O2 via [place] ultimately placed on BIPAP with improvement. Patient found to be positive for Influenza B, CTA chest negative for PE but concerning for multifocal pneumonia. Patient currently received Tamiflu , Rocephin , Doxycycline , duonebs. PCCM consulted for worsening hypoxia and hypercapnia. Will continue BIPAP for now, scheduled breathing treatments, with close monitoring of patient for worsening respiratory failure.  Pertinent  Medical History   has a past medical history of CHF (congestive heart failure) (HCC), Gestational diabetes, History of cesarean delivery (04/23/2016), Hypertension, PE (pulmonary thromboembolism) (HCC), and Sleep apnea.   Significant Hospital Events: Including procedures, antibiotic start and stop dates in addition to other pertinent events   12/30: PCCM consulted for worsening hypoxic and hypercapnic respiratory failure  12/31: remained on intermittent bipap  Interim History / Subjective:   Continues to use both high flow and BiPAP intermittently. Has been tolerating BiPAP well at night. Now on 7L [place] high flow. No complaints. Coughing up some brown mucous finally. Was able to get OOB to chair yesterday.   Objective    Blood pressure 111/69, pulse (!) 138, temperature 97.6 F (36.4 C), temperature source Oral, resp. rate (!) 24,  height 5' 2 (1.575 m), weight (!) 156 kg, SpO2 98%.    FiO2 (%):  [40 %] 40 %   Intake/Output Summary (Last 24 hours) at 03/18/2024 1010 Last data filed at 03/18/2024 9043 Gross per 24 hour  Intake 2492 ml  Output 1900 ml  Net 592 ml   Filed Weights   03/14/24 0858 03/14/24 1754  Weight: (!) 146.1 kg (!) 156 kg    Examination: General: adult female in NAD morbidly obese HENT: [place]/AT, PERRL, no JVD Lungs: Diminished breath sounds Cardiovascular: RRR, no MRG Abdomen: Soft, NT, ND Extremities: no acute deformity or ROM limitation Neuro: oriented x4     Resolved problem list   Assessment and Plan   Influenza B Multifocal Pneumonia  Acute Hypoxemic and Hypercapnic Respiratory Failure secondary to above CTA: negative for PE, patchy bilateral airspace disease concerning for multifocal pneumonia - MRSA screen negative; strep and legionella antigens negative.  - Rocephin /Doxycycline  (12/29), Tamiflu  (12/29) - Brovana , Yupelri , PRN Duoneb - 40mg  solumedrol daily - BC pending, unable to produce sputum for culture initially.  - Goal SpO2 92% or higher. Alternate between bipap and [place].   Hypertension Chronic Diastolic Congestive Heart Failure  -No longer follows with heart failure clinic due to improved EF from 45-50% to 60-65%, now follows with general cardiology -Losartan , aldactone , torsemide , Jardiance  continue -BNP was 2000 on presentation. Could consider additional diuresis if she plateaus, but there has been some steady improvement.   History of Pulmonary embolism (2024) -CTA negative for PE -continue Xarelto   Type II Diabetes, non insulin  requiring -Continue Jardiance   -On metformin  at home, not ordered due to sepsis   Obesity OSA -uses CPAP and O2 qhs at  home  Labs   CBC: Recent Labs  Lab 03/14/24 0851 03/14/24 1022 03/14/24 1449 03/15/24 0307 03/17/24 0317 03/18/24 0226  WBC 11.0*  --   --  9.2 6.4 8.6  NEUTROABS 8.6*  --   --   --  4.1  --   HGB 12.9  15.0 15.3* 13.3 13.7 14.0  HCT 41.2 44.0 45.0 43.4 45.8 45.8  MCV 70.5*  --   --  72.0* 72.7* 71.9*  PLT 369  --   --  365 330 325    Basic Metabolic Panel: Recent Labs  Lab 03/14/24 0851 03/14/24 1022 03/14/24 1449 03/15/24 0307 03/17/24 0317 03/18/24 0226  NA 134* 133* 135 138 134* 135  K 5.1 4.8 4.3 4.3 4.7 4.4  CL 94*  --   --  94* 89* 88*  CO2 32  --   --  39* 40* 40*  GLUCOSE 141*  --   --  98 92 72  BUN 9  --   --  9 11 16   CREATININE 0.69  --   --  0.78 0.68 0.59  CALCIUM 9.0  --   --  9.2 10.1 9.6  MG  --   --   --   --  2.5*  --    GFR: Estimated Creatinine Clearance: 148.8 mL/min (by C-G formula based on SCr of 0.59 mg/dL). Recent Labs  Lab 03/14/24 0851 03/14/24 1119 03/15/24 0307 03/17/24 0317 03/18/24 0226  WBC 11.0*  --  9.2 6.4 8.6  LATICACIDVEN  --  0.7  --   --   --     Liver Function Tests: Recent Labs  Lab 03/14/24 0851 03/18/24 0226  AST 27 26  ALT 12 11  ALKPHOS 127* 121  BILITOT 0.6 0.4  PROT 7.4 7.6  ALBUMIN 3.8 3.5   No results for input(s): LIPASE, AMYLASE in the last 168 hours. No results for input(s): AMMONIA in the last 168 hours.  ABG    Component Value Date/Time   PHART 7.41 03/17/2024 0740   PCO2ART 80 (HH) 03/17/2024 0740   PO2ART 75 (L) 03/17/2024 0740   HCO3 51.3 (H) 03/17/2024 0740   TCO2 45 (H) 03/14/2024 1449   O2SAT 96.6 03/17/2024 0740     Coagulation Profile: No results for input(s): INR, PROTIME in the last 168 hours.  Cardiac Enzymes: No results for input(s): CKTOTAL, CKMB, CKMBINDEX, TROPONINI in the last 168 hours.  HbA1C: Hgb A1c MFr Bld  Date/Time Value Ref Range Status  03/15/2024 03:07 AM 7.1 (H) 4.8 - 5.6 % Final    Comment:    (NOTE) Diagnosis of Diabetes The following HbA1c ranges recommended by the American Diabetes Association (ADA) may be used as an aid in the diagnosis of diabetes mellitus.  Hemoglobin             Suggested A1C NGSP%               Diagnosis  <5.7                   Non Diabetic  5.7-6.4                Pre-Diabetic  >6.4                   Diabetic  <7.0                   Glycemic control for  adults with diabetes.    01/27/2023 10:31 AM 6.6 (H) 4.8 - 5.6 % Final    Comment:    (NOTE) Pre diabetes:          5.7%-6.4%  Diabetes:              >6.4%  Glycemic control for   <7.0% adults with diabetes     CBG: Recent Labs  Lab 03/17/24 0732 03/17/24 1106 03/17/24 1645 03/17/24 2207 03/18/24 0754  GLUCAP 97 138* 180* 175* 115*       36 minutes spent coordinating care for Christine Singleton [place], AGACNP-BC [facility]  See Amion for personal pager PCCM on call pager [phone] until 7pm. Please call Elink 7p-7a. [phone]  03/18/2024 10:18 AM        "

## 2024-03-19 ENCOUNTER — Inpatient Hospital Stay (HOSPITAL_COMMUNITY)

## 2024-03-19 DIAGNOSIS — J9602 Acute respiratory failure with hypercapnia: Secondary | ICD-10-CM | POA: Diagnosis not present

## 2024-03-19 DIAGNOSIS — J9601 Acute respiratory failure with hypoxia: Secondary | ICD-10-CM | POA: Diagnosis not present

## 2024-03-19 LAB — GLUCOSE, CAPILLARY
Glucose-Capillary: 111 mg/dL — ABNORMAL HIGH (ref 70–99)
Glucose-Capillary: 168 mg/dL — ABNORMAL HIGH (ref 70–99)
Glucose-Capillary: 125 mg/dL — ABNORMAL HIGH (ref 70–99)
Glucose-Capillary: 245 mg/dL — ABNORMAL HIGH (ref 70–99)

## 2024-03-19 LAB — TROPONIN T, HIGH SENSITIVITY
Troponin T High Sensitivity: <15 ng/L (ref 0–19)
Troponin T High Sensitivity: <15 ng/L (ref 0–19)

## 2024-03-19 MED ORDER — METHYLPREDNISOLONE SODIUM SUCC 40 MG IJ SOLR
40.0000 mg | Freq: Every day | INTRAMUSCULAR | Status: AC
  Administered 2024-03-19 – 2024-03-20 (×2): 40 mg via INTRAVENOUS
  Filled 2024-03-19 (×2): qty 1

## 2024-03-19 MED ORDER — MORPHINE SULFATE (PF) 2 MG/ML IV SOLN
2.0000 mg | Freq: Once | INTRAVENOUS | Status: AC
  Administered 2024-03-19: 2 mg via INTRAVENOUS
  Filled 2024-03-19: qty 1

## 2024-03-19 MED ORDER — HYDROMORPHONE HCL 1 MG/ML IJ SOLN
0.5000 mg | INTRAMUSCULAR | Status: DC | PRN
  Filled 2024-03-19: qty 1

## 2024-03-19 NOTE — Progress Notes (Addendum)
 " PROGRESS NOTE    Christine Singleton  FMW:[id number] DOB: 07-10-1990 DOA: 03/14/2024 PCP: Celestia Rosaline SQUIBB, NP  Brief Narrative: 34 y.o. female with medical history significant of PE on Xarelto , hypertension, diabetes, morbid obesity, cardiomyopathy who presents with shortness of breath.  She states that she has not been feeling well for about a week.  Patient tested positive for Influenza B, chest xray showed bilateral infiltrates required Bipap, 6-8L supplemental O2 admitted to [facility] service for further management and evaluation for acute hypoxic and hypercapnic respiratory failure.   Assessment & Plan:   Principal Problem:   Acute respiratory failure with hypoxia and hypercapnia (HCC) Active Problems:   Acute on chronic diastolic CHF (congestive heart failure) (HCC)   Essential hypertension   History of pulmonary embolism   Non-insulin  dependent type 2 diabetes mellitus (HCC)   OSA (obstructive sleep apnea)   Class 3 obesity (HCC)   Influenza A with pneumonia   Multifocal pneumonia   Morbid obesity with BMI of 60.0-69.9, adult (HCC)   Influenza B   Sleep apnea   Acute metabolic encephalopathy    Acute on chronic hypoxic and hypercapnic respiratory failure-patient tolerated BiPAP for few hours last night and then she took it off as she started coughing violently.  She does have a CPAP at home which she says he uses almost every night ?  Patient was placed on BiPAP again today due to persistent hypercapnia trying to avoid intubation.  Appreciate PCCM.  Although her oxygen requirement came down to 8 L from 15 today.  Urine Legionella and strep pneumo negative MRSA negative.  On Rocephin  and doxycycline  Tamiflu .  Management per PCCM.  Continue Brovana  and Yupelri .   Severe Sepsis secondary to multifocal pneumonia Secondary bacterial infection from influenza B- Presented with tachycardic, tachypneic with respiratory failure. Continue Rocephin , azithromycin  therapy. Continue Tamiflu   5-day therapy. Continue Bipap, supplemental oxygen to maintain saturation greater than 92%.   Acute Metabolic encephalopathy- improving In the setting of hypercarbia. Co2 down to 80 from 102  She will be on Bipap for now. Continue to monitor Co2 level. Close neurochecks, delirium precautions. High risk for clinical deterioration.   Hypertension: Chronic diastolic CHF- Improved EF 60 to 65%. Continue losartan , Aldactone , torsemide  and Jardiance .   History of PE- Continue Xarelto . CTA negative for PE.   Obstructive sleep apnea on CPAP at night Continue BiPAP for now. Continue PAP device at night with oxygen.   Morbid obesity BMI 62.92. Diet, exercise and weight reduction advised.    Estimated body mass index is 62.92 kg/m as calculated from the following:   Height as of this encounter: 5' 2 (1.575 m).   Weight as of this encounter: 156 kg.  DVT prophylaxis: xarelto  Code Status: full Family Communication:  Disposition Plan:  Status is: Inpatient  Consultants: pccm  Procedures: bipap Antimicrobials:   Subjective:   Used BiPAP overnight 4 more hours last night.  Complains of pleuritic chest pain received 1 dose of morphine . EKG from today with some T wave inversions which was not there on the 29th.  Will repeat EKG.  Objective: Vitals:   03/19/24 0850 03/19/24 0900 03/19/24 0908 03/19/24 1000  BP:  (!) 151/105  138/85  Pulse:  80  80  Resp:  18  19  Temp:   98.9 F (37.2 C)   TempSrc:   Oral   SpO2: 94% 96%  94%  Weight:      Height:        Intake/Output Summary (Last  24 hours) at 03/19/2024 1037 Last data filed at 03/19/2024 1012 Gross per 24 hour  Intake 847.3 ml  Output 1850 ml  Net -1002.7 ml   Filed Weights   03/14/24 0858 03/14/24 1754  Weight: (!) 146.1 kg (!) 156 kg    Examination:  General exam: Appears anxious and tearful Respiratory system: Diminished breath sounds Cardiovascular system: Regular  gastrointestinal system: Abdomen is  distended, soft and nontender. No organomegaly or masses felt. Normal bowel sounds heard. Central nervous system: Alert and oriented. No focal neurological deficits. Extremities: trace edema  Data Reviewed: I have personally reviewed following labs and imaging studies  CBC: Recent Labs  Lab 03/14/24 0851 03/14/24 1022 03/14/24 1449 03/15/24 0307 03/17/24 0317 03/18/24 0226  WBC 11.0*  --   --  9.2 6.4 8.6  NEUTROABS 8.6*  --   --   --  4.1  --   HGB 12.9 15.0 15.3* 13.3 13.7 14.0  HCT 41.2 44.0 45.0 43.4 45.8 45.8  MCV 70.5*  --   --  72.0* 72.7* 71.9*  PLT 369  --   --  365 330 325   Basic Metabolic Panel: Recent Labs  Lab 03/14/24 0851 03/14/24 1022 03/14/24 1449 03/15/24 0307 03/17/24 0317 03/18/24 0226  NA 134* 133* 135 138 134* 135  K 5.1 4.8 4.3 4.3 4.7 4.4  CL 94*  --   --  94* 89* 88*  CO2 32  --   --  39* 40* 40*  GLUCOSE 141*  --   --  98 92 72  BUN 9  --   --  9 11 16   CREATININE 0.69  --   --  0.78 0.68 0.59  CALCIUM 9.0  --   --  9.2 10.1 9.6  MG  --   --   --   --  2.5*  --    GFR: Estimated Creatinine Clearance: 148.8 mL/min (by C-G formula based on SCr of 0.59 mg/dL). Liver Function Tests: Recent Labs  Lab 03/14/24 0851 03/18/24 0226  AST 27 26  ALT 12 11  ALKPHOS 127* 121  BILITOT 0.6 0.4  PROT 7.4 7.6  ALBUMIN 3.8 3.5   No results for input(s): LIPASE, AMYLASE in the last 168 hours. No results for input(s): AMMONIA in the last 168 hours. Coagulation Profile: No results for input(s): INR, PROTIME in the last 168 hours. Cardiac Enzymes: No results for input(s): CKTOTAL, CKMB, CKMBINDEX, TROPONINI in the last 168 hours. BNP (last 3 results) Recent Labs    11/18/23 1612 01/02/24 2152 03/14/24 0851  PROBNP 193.0 1,588.0* 2,173.0*   HbA1C: No results for input(s): HGBA1C in the last 72 hours.  CBG: Recent Labs  Lab 03/18/24 0754 03/18/24 1212 03/18/24 1631 03/18/24 2203 03/19/24 0736  GLUCAP 115* 238* 232*  132* 111*   Lipid Profile: No results for input(s): CHOL, HDL, LDLCALC, TRIG, CHOLHDL, LDLDIRECT in the last 72 hours. Thyroid Function Tests: No results for input(s): TSH, T4TOTAL, FREET4, T3FREE, THYROIDAB in the last 72 hours. Anemia Panel: No results for input(s): VITAMINB12, FOLATE, FERRITIN, TIBC, IRON, RETICCTPCT in the last 72 hours. Sepsis Labs: Recent Labs  Lab 03/14/24 1119  LATICACIDVEN 0.7    Recent Results (from the past 240 hours)  MRSA Next Gen by PCR, Nasal     Status: None   Collection Time: 03/14/24 12:11 AM   Specimen: Nasal Mucosa; Nasal Swab  Result Value Ref Range Status   MRSA by PCR Next Gen NOT DETECTED NOT DETECTED Final  Comment: (NOTE) The GeneXpert MRSA Assay (FDA approved for NASAL specimens only), is one component of a comprehensive MRSA colonization surveillance program. It is not intended to diagnose MRSA infection nor to guide or monitor treatment for MRSA infections. Test performance is not FDA approved in patients less than 62 years old. Performed at [facility], [address] [place], [place] [address]   Resp panel by RT-PCR (RSV, Flu A&B, Covid) Anterior Nasal Swab     Status: Abnormal   Collection Time: 03/14/24  8:52 AM   Specimen: Anterior Nasal Swab  Result Value Ref Range Status   SARS Coronavirus 2 by RT PCR NEGATIVE NEGATIVE Final    Comment: (NOTE) SARS-CoV-2 target nucleic acids are NOT DETECTED.  The SARS-CoV-2 RNA is generally detectable in upper respiratory specimens during the acute phase of infection. The lowest concentration of SARS-CoV-2 viral copies this assay can detect is 138 copies/mL. A negative result does not preclude SARS-Cov-2 infection and should not be used as the sole basis for treatment or other patient management decisions. A negative result may occur with  improper specimen collection/handling, submission of specimen other than nasopharyngeal  swab, presence of viral mutation(s) within the areas targeted by this assay, and inadequate number of viral copies(<138 copies/mL). A negative result must be combined with clinical observations, patient history, and epidemiological information. The expected result is Negative.  Fact Sheet for Patients:  bloggercourse.com  Fact Sheet for Healthcare Providers:  seriousbroker.it  This test is no t yet approved or cleared by the [place]  FDA and  has been authorized for detection and/or diagnosis of SARS-CoV-2 by FDA under an Emergency Use Authorization (EUA). This EUA will remain  in effect (meaning this test can be used) for the duration of the COVID-19 declaration under Section 564(b)(1) of the Act, 21 U.S.C.section 360bbb-3(b)(1), unless the authorization is terminated  or revoked sooner.       Influenza A by PCR NEGATIVE NEGATIVE Final   Influenza B by PCR POSITIVE (A) NEGATIVE Final    Comment: (NOTE) The Xpert Xpress SARS-CoV-2/FLU/RSV plus assay is intended as an aid in the diagnosis of influenza from Nasopharyngeal swab specimens and should not be used as a sole basis for treatment. Nasal washings and aspirates are unacceptable for Xpert Xpress SARS-CoV-2/FLU/RSV testing.  Fact Sheet for Patients: bloggercourse.com  Fact Sheet for Healthcare Providers: seriousbroker.it  This test is not yet approved or cleared by the [place]  FDA and has been authorized for detection and/or diagnosis of SARS-CoV-2 by FDA under an Emergency Use Authorization (EUA). This EUA will remain in effect (meaning this test can be used) for the duration of the COVID-19 declaration under Section 564(b)(1) of the Act, 21 U.S.C. section 360bbb-3(b)(1), unless the authorization is terminated or revoked.     Resp Syncytial Virus by PCR NEGATIVE NEGATIVE Final    Comment: (NOTE) Fact Sheet  for Patients: bloggercourse.com  Fact Sheet for Healthcare Providers: seriousbroker.it  This test is not yet approved or cleared by the [place]  FDA and has been authorized for detection and/or diagnosis of SARS-CoV-2 by FDA under an Emergency Use Authorization (EUA). This EUA will remain in effect (meaning this test can be used) for the duration of the COVID-19 declaration under Section 564(b)(1) of the Act, 21 U.S.C. section 360bbb-3(b)(1), unless the authorization is terminated or revoked.  Performed at [facility], [address] [place], [place] [address]   Blood culture (routine x 2)     Status:  None (Preliminary result)   Collection Time: 03/14/24 10:15 AM   Specimen: BLOOD  Result Value Ref Range Status   Specimen Description   Final    BLOOD RIGHT ANTECUBITAL Performed at [facility], [address] [place], [place] [address]    Special Requests   Final    BOTTLES DRAWN AEROBIC AND ANAEROBIC Blood Culture results may not be optimal due to an inadequate volume of blood received in culture bottles Performed at [facility], [address] [place], [place] [address]    Culture  Setup Time   Final    GRAM POSITIVE RODS ANAEROBIC BOTTLE ONLY CRITICAL RESULT CALLED TO, READ BACK BY AND VERIFIED WITH: MAYA CHARM JEANS [id number] @ 2058 FH Performed at [facility], [address], [place], [place] [address]    Culture GRAM POSITIVE RODS  Final   Report Status PENDING  Incomplete  Blood culture (routine x 2)     Status: None (Preliminary result)   Collection Time: 03/14/24 10:20 AM   Specimen: BLOOD  Result Value Ref Range Status   Specimen Description   Final    BLOOD BLOOD LEFT HAND Performed at [facility], [address] [place], [place] [address]    Special Requests   Final    BOTTLES DRAWN AEROBIC AND ANAEROBIC Blood Culture adequate volume Performed at [facility], [address] [place], [place] [address]    Culture  Setup Time   Final    GRAM POSITIVE RODS ANAEROBIC BOTTLE ONLY CRITICAL RESULT CALLED TO, READ BACK BY AND VERIFIED WITH:  MITCHELL LILLISTONPHARMD 03/19/2024 BY DD @ [phone] Performed at [facility], [address], [place], [place] [address]    Culture GRAM POSITIVE RODS  Final   Report Status PENDING  Incomplete     Radiology Studies: No results found.   Scheduled Meds:  arformoterol   15 mcg Nebulization BID   Chlorhexidine  Gluconate Cloth  6 each Topical Q2200   empagliflozin   10 mg Oral Daily   Gerhardt's butt cream   Topical BID   guaiFENesin   1,200 mg Oral BID   insulin  aspart  0-15 Units Subcutaneous TID WC   losartan   25 mg Oral QPM   methylPREDNISolone  (SOLU-MEDROL ) injection  40 mg Intravenous Daily   mouth rinse  15 mL Mouth Rinse 4 times per day   revefenacin   175 mcg Nebulization Daily   rivaroxaban   20 mg Oral QPC supper   sodium chloride  flush  10-40 mL Intracatheter Q12H   sodium chloride  flush  3 mL Intravenous Q12H   sodium chloride  flush  3 mL Intravenous Q12H   spironolactone   25 mg Oral QPM   torsemide   20 mg Oral Daily   Continuous Infusions:  cefTRIAXone  (ROCEPHIN )  IV Stopped (03/19/24 1007)   doxycycline  (VIBRAMYCIN ) IV 100 mg (03/19/24 1019)   promethazine  (PHENERGAN ) injection (IM or IVPB) Stopped (03/15/24 1337)     LOS: 5 days    Almarie KANDICE Hoots, MD  03/19/2024, 10:37 AM   "

## 2024-03-19 NOTE — Progress Notes (Signed)
" °   03/19/24 2259  BiPAP/CPAP/SIPAP  BiPAP/CPAP/SIPAP Pt Type Adult  BiPAP/CPAP/SIPAP SERVO (air)  Mask Type Full face mask  Dentures removed? Not applicable  Mask Size Medium  Set Rate 22 breaths/min  Respiratory Rate 24 breaths/min  IPAP 24 cmH20  EPAP 10 cmH2O  Pressure Support 14 cmH20  PEEP 10 cmH20  FiO2 (%) 40 %  Minute Ventilation 9.1  Leak 27  Peak Inspiratory Pressure (PIP) 23  Tidal Volume (Vt) 545  Patient Home Machine No  Patient Home Mask No  Patient Home Tubing No  Auto Titrate No  Press High Alarm 35 cmH2O  Press Low Alarm 5 cmH2O  Device Plugged into RED Power Outlet Yes  BiPAP/CPAP /SiPAP Vitals  Pulse Rate 93  Resp 15  SpO2 97 %  MEWS Score/Color  MEWS Score 0  MEWS Score Color Green    "

## 2024-03-19 NOTE — Plan of Care (Signed)
  Problem: Clinical Measurements: Goal: Respiratory complications will improve Outcome: Progressing Goal: Cardiovascular complication will be avoided Outcome: Progressing   Problem: Activity: Goal: Risk for activity intolerance will decrease Outcome: Progressing   Problem: Nutrition: Goal: Adequate nutrition will be maintained Outcome: Progressing   Problem: Elimination: Goal: Will not experience complications related to bowel motility Outcome: Progressing Goal: Will not experience complications related to urinary retention Outcome: Progressing   Problem: Pain Managment: Goal: General experience of comfort will improve and/or be controlled Outcome: Progressing

## 2024-03-20 DIAGNOSIS — J9601 Acute respiratory failure with hypoxia: Secondary | ICD-10-CM | POA: Diagnosis not present

## 2024-03-20 DIAGNOSIS — J9602 Acute respiratory failure with hypercapnia: Secondary | ICD-10-CM | POA: Diagnosis not present

## 2024-03-20 LAB — COMPREHENSIVE METABOLIC PANEL WITH GFR
ALT: 22 U/L (ref 0–44)
AST: 31 U/L (ref 15–41)
Albumin: 4 g/dL (ref 3.5–5.0)
Alkaline Phosphatase: 137 U/L — ABNORMAL HIGH (ref 38–126)
Anion gap: 9 (ref 5–15)
BUN: 16 mg/dL (ref 6–20)
CO2: 38 mmol/L — ABNORMAL HIGH (ref 22–32)
Calcium: 9.7 mg/dL (ref 8.9–10.3)
Chloride: 89 mmol/L — ABNORMAL LOW (ref 98–111)
Creatinine, Ser: 0.65 mg/dL (ref 0.44–1.00)
GFR, Estimated: >60 mL/min
Glucose, Bld: 158 mg/dL — ABNORMAL HIGH (ref 70–99)
Potassium: 3.9 mmol/L (ref 3.5–5.1)
Sodium: 136 mmol/L (ref 135–145)
Total Bilirubin: 0.7 mg/dL (ref 0.0–1.2)
Total Protein: 8.4 g/dL — ABNORMAL HIGH (ref 6.5–8.1)

## 2024-03-20 LAB — CULTURE, BLOOD (ROUTINE X 2)

## 2024-03-20 LAB — GLUCOSE, CAPILLARY
Glucose-Capillary: 102 mg/dL — ABNORMAL HIGH (ref 70–99)
Glucose-Capillary: 195 mg/dL — ABNORMAL HIGH (ref 70–99)
Glucose-Capillary: 183 mg/dL — ABNORMAL HIGH (ref 70–99)
Glucose-Capillary: 86 mg/dL (ref 70–99)

## 2024-03-20 LAB — CBC
HCT: 51.1 % — ABNORMAL HIGH (ref 36.0–46.0)
Hemoglobin: 16.1 g/dL — ABNORMAL HIGH (ref 12.0–15.0)
MCH: 22.1 pg — ABNORMAL LOW (ref 26.0–34.0)
MCHC: 31.5 g/dL (ref 30.0–36.0)
MCV: 70.1 fL — ABNORMAL LOW (ref 80.0–100.0)
Platelets: 384 K/uL (ref 150–400)
RBC: 7.29 MIL/uL — ABNORMAL HIGH (ref 3.87–5.11)
RDW: 21 % — ABNORMAL HIGH (ref 11.5–15.5)
WBC: 15.4 K/uL — ABNORMAL HIGH (ref 4.0–10.5)
nRBC: 0 % (ref 0.0–0.2)

## 2024-03-20 NOTE — Progress Notes (Signed)
 " PROGRESS NOTE    Christine Singleton  FMW:[id number] DOB: 1990/07/24 DOA: 03/14/2024 PCP: Celestia Rosaline SQUIBB, NP  Brief Narrative: 34 y.o. female with medical history significant of PE on Xarelto , hypertension, diabetes, morbid obesity, cardiomyopathy who presents with shortness of breath.  She states that she has not been feeling well for about a week.  Patient tested positive for Influenza B, chest xray showed bilateral infiltrates required Bipap, 6-8L supplemental O2 admitted to [facility] service for further management and evaluation for acute hypoxic and hypercapnic respiratory failure.   Assessment & Plan:   Principal Problem:   Acute respiratory failure with hypoxia and hypercapnia (HCC) Active Problems:   Acute on chronic diastolic CHF (congestive heart failure) (HCC)   Essential hypertension   History of pulmonary embolism   Non-insulin  dependent type 2 diabetes mellitus (HCC)   OSA (obstructive sleep apnea)   Class 3 obesity (HCC)   Influenza A with pneumonia   Multifocal pneumonia   Morbid obesity with BMI of 60.0-69.9, adult (HCC)   Influenza B   Sleep apnea   Acute metabolic encephalopathy    Acute on chronic hypoxic and hypercapnic respiratory failure-patient tolerated BiPAP last night.  She feels improved oxygen requirement down to 5 L.  She says she uses oxygen at home at night unclear how much.  She does have CPAP at home which she uses most of the nights.  Urine Legionella and strep pneumo negative MRSA negative.  On Rocephin  and doxycycline  Tamiflu . Continue Brovana  and Yupelri . Encourage out of bed and ambulate the patient consult physical therapy.   Severe Sepsis secondary to multifocal pneumonia Secondary bacterial infection from influenza B- Presented with tachycardic, tachypneic with respiratory failure. Continue Rocephin , azithromycin  therapy. She finished her course of Tamiflu  Continue Bipap, supplemental oxygen to maintain saturation greater than 92%.    Acute Metabolic encephalopathy-resolved in the setting of hypercarbia. Co2 down to 80 from 102.  BiPAP nightly and as needed.  Chronic diastolic CHF- Improved EF 60 to 65%. Continue losartan , Aldactone , torsemide  and Jardiance .   History of PE- Continue Xarelto . CTA negative for PE.   Obstructive sleep apnea on CPAP at night Continue BiPAP for now. Continue PAP device at night with oxygen.   Morbid obesity BMI 62.92. Diet, exercise and weight reduction advised.    Estimated body mass index is 62.92 kg/m as calculated from the following:   Height as of this encounter: 5' 2 (1.575 m).   Weight as of this encounter: 156 kg.  DVT prophylaxis: xarelto  Code Status: full Family Communication:  Disposition Plan:  Status is: Inpatient  Consultants: pccm  Procedures: bipap Antimicrobials:   Subjective:   Complains of chest pain uses BiPAP last night much more comfortable and resting   Objective: Vitals:   03/20/24 0900 03/20/24 0904 03/20/24 0905 03/20/24 1000  BP:      Pulse: 84   87  Resp: (!) 23   11  Temp:      TempSrc:      SpO2: 99% 99% 99% 97%  Weight:      Height:        Intake/Output Summary (Last 24 hours) at 03/20/2024 1120 Last data filed at 03/20/2024 9771 Gross per 24 hour  Intake 1218 ml  Output 2900 ml  Net -1682 ml   Filed Weights   03/14/24 0858 03/14/24 1754  Weight: (!) 146.1 kg (!) 156 kg    Examination:  General exam: Appears anxious and tearful Respiratory system: Diminished breath sounds Cardiovascular system: Regular  gastrointestinal system: Abdomen is distended, soft and nontender. No organomegaly or masses felt. Normal bowel sounds heard. Central nervous system: Alert and oriented. No focal neurological deficits. Extremities: trace edema  Data Reviewed: I have personally reviewed following labs and imaging studies  CBC: Recent Labs  Lab 03/14/24 0851 03/14/24 1022 03/14/24 1449 03/15/24 0307 03/17/24 0317 03/18/24 0226   WBC 11.0*  --   --  9.2 6.4 8.6  NEUTROABS 8.6*  --   --   --  4.1  --   HGB 12.9 15.0 15.3* 13.3 13.7 14.0  HCT 41.2 44.0 45.0 43.4 45.8 45.8  MCV 70.5*  --   --  72.0* 72.7* 71.9*  PLT 369  --   --  365 330 325   Basic Metabolic Panel: Recent Labs  Lab 03/14/24 0851 03/14/24 1022 03/14/24 1449 03/15/24 0307 03/17/24 0317 03/18/24 0226  NA 134* 133* 135 138 134* 135  K 5.1 4.8 4.3 4.3 4.7 4.4  CL 94*  --   --  94* 89* 88*  CO2 32  --   --  39* 40* 40*  GLUCOSE 141*  --   --  98 92 72  BUN 9  --   --  9 11 16   CREATININE 0.69  --   --  0.78 0.68 0.59  CALCIUM 9.0  --   --  9.2 10.1 9.6  MG  --   --   --   --  2.5*  --    GFR: Estimated Creatinine Clearance: 148.8 mL/min (by C-G formula based on SCr of 0.59 mg/dL). Liver Function Tests: Recent Labs  Lab 03/14/24 0851 03/18/24 0226  AST 27 26  ALT 12 11  ALKPHOS 127* 121  BILITOT 0.6 0.4  PROT 7.4 7.6  ALBUMIN 3.8 3.5   No results for input(s): LIPASE, AMYLASE in the last 168 hours. No results for input(s): AMMONIA in the last 168 hours. Coagulation Profile: No results for input(s): INR, PROTIME in the last 168 hours. Cardiac Enzymes: No results for input(s): CKTOTAL, CKMB, CKMBINDEX, TROPONINI in the last 168 hours. BNP (last 3 results) Recent Labs    11/18/23 1612 01/02/24 2152 03/14/24 0851  PROBNP 193.0 1,588.0* 2,173.0*   HbA1C: No results for input(s): HGBA1C in the last 72 hours.  CBG: Recent Labs  Lab 03/19/24 0736 03/19/24 1118 03/19/24 1732 03/19/24 2228 03/20/24 0743  GLUCAP 111* 168* 125* 245* 102*   Lipid Profile: No results for input(s): CHOL, HDL, LDLCALC, TRIG, CHOLHDL, LDLDIRECT in the last 72 hours. Thyroid Function Tests: No results for input(s): TSH, T4TOTAL, FREET4, T3FREE, THYROIDAB in the last 72 hours. Anemia Panel: No results for input(s): VITAMINB12, FOLATE, FERRITIN, TIBC, IRON, RETICCTPCT in the last 72  hours. Sepsis Labs: Recent Labs  Lab 03/14/24 1119  LATICACIDVEN 0.7    Recent Results (from the past 240 hours)  MRSA Next Gen by PCR, Nasal     Status: None   Collection Time: 03/14/24 12:11 AM   Specimen: Nasal Mucosa; Nasal Swab  Result Value Ref Range Status   MRSA by PCR Next Gen NOT DETECTED NOT DETECTED Final    Comment: (NOTE) The GeneXpert MRSA Assay (FDA approved for NASAL specimens only), is one component of a comprehensive MRSA colonization surveillance program. It is not intended to diagnose MRSA infection nor to guide or monitor treatment for MRSA infections. Test performance is not FDA approved in patients less than 60 years old. Performed at [facility], [address]  [place], [place] [address]   Resp panel by RT-PCR (RSV, Flu A&B, Covid) Anterior Nasal Swab     Status: Abnormal   Collection Time: 03/14/24  8:52 AM   Specimen: Anterior Nasal Swab  Result Value Ref Range Status   SARS Coronavirus 2 by RT PCR NEGATIVE NEGATIVE Final    Comment: (NOTE) SARS-CoV-2 target nucleic acids are NOT DETECTED.  The SARS-CoV-2 RNA is generally detectable in upper respiratory specimens during the acute phase of infection. The lowest concentration of SARS-CoV-2 viral copies this assay can detect is 138 copies/mL. A negative result does not preclude SARS-Cov-2 infection and should not be used as the sole basis for treatment or other patient management decisions. A negative result may occur with  improper specimen collection/handling, submission of specimen other than nasopharyngeal swab, presence of viral mutation(s) within the areas targeted by this assay, and inadequate number of viral copies(<138 copies/mL). A negative result must be combined with clinical observations, patient history, and epidemiological information. The expected result is Negative.  Fact Sheet for Patients:  bloggercourse.com  Fact Sheet for  Healthcare Providers:  seriousbroker.it  This test is no t yet approved or cleared by the [place]  FDA and  has been authorized for detection and/or diagnosis of SARS-CoV-2 by FDA under an Emergency Use Authorization (EUA). This EUA will remain  in effect (meaning this test can be used) for the duration of the COVID-19 declaration under Section 564(b)(1) of the Act, 21 U.S.C.section 360bbb-3(b)(1), unless the authorization is terminated  or revoked sooner.       Influenza A by PCR NEGATIVE NEGATIVE Final   Influenza B by PCR POSITIVE (A) NEGATIVE Final    Comment: (NOTE) The Xpert Xpress SARS-CoV-2/FLU/RSV plus assay is intended as an aid in the diagnosis of influenza from Nasopharyngeal swab specimens and should not be used as a sole basis for treatment. Nasal washings and aspirates are unacceptable for Xpert Xpress SARS-CoV-2/FLU/RSV testing.  Fact Sheet for Patients: bloggercourse.com  Fact Sheet for Healthcare Providers: seriousbroker.it  This test is not yet approved or cleared by the [place]  FDA and has been authorized for detection and/or diagnosis of SARS-CoV-2 by FDA under an Emergency Use Authorization (EUA). This EUA will remain in effect (meaning this test can be used) for the duration of the COVID-19 declaration under Section 564(b)(1) of the Act, 21 U.S.C. section 360bbb-3(b)(1), unless the authorization is terminated or revoked.     Resp Syncytial Virus by PCR NEGATIVE NEGATIVE Final    Comment: (NOTE) Fact Sheet for Patients: bloggercourse.com  Fact Sheet for Healthcare Providers: seriousbroker.it  This test is not yet approved or cleared by the [place]  FDA and has been authorized for detection and/or diagnosis of SARS-CoV-2 by FDA under an Emergency Use Authorization (EUA). This EUA will remain in effect (meaning  this test can be used) for the duration of the COVID-19 declaration under Section 564(b)(1) of the Act, 21 U.S.C. section 360bbb-3(b)(1), unless the authorization is terminated or revoked.  Performed at [facility], [address] [place], [place] [address]   Blood culture (routine x 2)     Status: None (Preliminary result)   Collection Time: 03/14/24 10:15 AM   Specimen: BLOOD  Result Value Ref Range Status   Specimen Description   Final    BLOOD RIGHT ANTECUBITAL Performed at [facility], [address] [place], [place] [address]    Special Requests   Final    BOTTLES DRAWN AEROBIC AND  ANAEROBIC Blood Culture results may not be optimal due to an inadequate volume of blood received in culture bottles Performed at [facility], [address] [place], [place] [address]    Culture  Setup Time   Final    GRAM POSITIVE RODS ANAEROBIC BOTTLE ONLY CRITICAL RESULT CALLED TO, READ BACK BY AND VERIFIED WITH: PHARMD D. [id number] @ 2058 FH    Culture   Final    GRAM POSITIVE RODS IDENTIFICATION TO FOLLOW Performed at [facility], [address] [place], [place] [address]    Report Status PENDING  Incomplete  Blood culture (routine x 2)     Status: None (Preliminary result)   Collection Time: 03/14/24 10:20 AM   Specimen: BLOOD  Result Value Ref Range Status   Specimen Description   Final    BLOOD BLOOD LEFT HAND Performed at [facility], [address] [place], [place] [address]    Special Requests   Final    BOTTLES DRAWN AEROBIC AND ANAEROBIC Blood Culture adequate volume Performed at [facility], [address] [place], [place] [address]    Culture  Setup Time   Final    GRAM POSITIVE RODS ANAEROBIC BOTTLE ONLY CRITICAL RESULT CALLED TO, READ BACK BY AND VERIFIED WITH:  MITCHELL LILLISTONPHARMD 03/19/2024 BY DD @ [phone] Performed at [facility], [address] [place], [place] [address]     Culture GRAM POSITIVE RODS  Final   Report Status PENDING  Incomplete     Radiology Studies: DG Chest 1 View Result Date: 03/19/2024 EXAM: 1 VIEW XRAY OF THE CHEST 03/19/2024 11:01:00 AM COMPARISON: 03/14/2024 CLINICAL HISTORY: SOB (shortness of breath) FINDINGS: LUNGS AND PLEURA: Mild interstitial prominence. Improved aeration. No focal pulmonary opacity. No pleural effusion. No pneumothorax. HEART AND MEDIASTINUM: Cardiomegaly, unchanged. BONES AND SOFT TISSUES: No acute osseous abnormality. IMPRESSION: 1. Mild interstitial prominence with improved aeration. 2. Unchanged cardiomegaly. Electronically signed by: Lonni Necessary MD 03/19/2024 05:34 PM EST RP Workstation: [id number]     Scheduled Meds:  arformoterol   15 mcg Nebulization BID   Chlorhexidine  Gluconate Cloth  6 each Topical Q2200   empagliflozin   10 mg Oral Daily   Gerhardt's butt cream   Topical BID   guaiFENesin   1,200 mg Oral BID   insulin  aspart  0-15 Units Subcutaneous TID WC   losartan   25 mg Oral QPM   mouth rinse  15 mL Mouth Rinse 4 times per day   revefenacin   175 mcg Nebulization Daily   rivaroxaban   20 mg Oral QPC supper   sodium chloride  flush  10-40 mL Intracatheter Q12H   sodium chloride  flush  3 mL Intravenous Q12H   sodium chloride  flush  3 mL Intravenous Q12H   spironolactone   25 mg Oral QPM   torsemide   20 mg Oral Daily   Continuous Infusions:  cefTRIAXone  (ROCEPHIN )  IV Stopped (03/19/24 1007)   doxycycline  (VIBRAMYCIN ) IV 100 mg (03/20/24 0949)   promethazine  (PHENERGAN ) injection (IM or IVPB) Stopped (03/15/24 1337)     LOS: 6 days    Almarie KANDICE Hoots, MD  03/20/2024, 11:20 AM   "

## 2024-03-20 NOTE — Plan of Care (Signed)

## 2024-03-21 ENCOUNTER — Telehealth: Payer: Self-pay | Admitting: Pulmonary Disease

## 2024-03-21 ENCOUNTER — Inpatient Hospital Stay (HOSPITAL_COMMUNITY)

## 2024-03-21 DIAGNOSIS — J9601 Acute respiratory failure with hypoxia: Secondary | ICD-10-CM | POA: Diagnosis not present

## 2024-03-21 DIAGNOSIS — J9602 Acute respiratory failure with hypercapnia: Secondary | ICD-10-CM | POA: Diagnosis not present

## 2024-03-21 LAB — CULTURE, BLOOD (ROUTINE X 2)
Culture  Setup Time: NO GROWTH
Special Requests: ADEQUATE

## 2024-03-21 LAB — COMPREHENSIVE METABOLIC PANEL WITH GFR
ALT: 24 U/L (ref 0–44)
AST: 31 U/L (ref 15–41)
Albumin: 4.1 g/dL (ref 3.5–5.0)
Alkaline Phosphatase: 136 U/L — ABNORMAL HIGH (ref 38–126)
Anion gap: 11 (ref 5–15)
BUN: 14 mg/dL (ref 6–20)
CO2: 37 mmol/L — ABNORMAL HIGH (ref 22–32)
Calcium: 9.9 mg/dL (ref 8.9–10.3)
Chloride: 88 mmol/L — ABNORMAL LOW (ref 98–111)
Creatinine, Ser: 0.7 mg/dL (ref 0.44–1.00)
GFR, Estimated: >60 mL/min
Glucose, Bld: 119 mg/dL — ABNORMAL HIGH (ref 70–99)
Potassium: 3.6 mmol/L (ref 3.5–5.1)
Sodium: 136 mmol/L (ref 135–145)
Total Bilirubin: 0.9 mg/dL (ref 0.0–1.2)
Total Protein: 8.5 g/dL — ABNORMAL HIGH (ref 6.5–8.1)

## 2024-03-21 LAB — CBC
HCT: 51.1 % — ABNORMAL HIGH (ref 36.0–46.0)
Hemoglobin: 15.7 g/dL — ABNORMAL HIGH (ref 12.0–15.0)
MCH: 21.8 pg — ABNORMAL LOW (ref 26.0–34.0)
MCHC: 30.7 g/dL (ref 30.0–36.0)
MCV: 71 fL — ABNORMAL LOW (ref 80.0–100.0)
Platelets: 323 K/uL (ref 150–400)
RBC: 7.2 MIL/uL — ABNORMAL HIGH (ref 3.87–5.11)
RDW: 20.8 % — ABNORMAL HIGH (ref 11.5–15.5)
WBC: 15.3 K/uL — ABNORMAL HIGH (ref 4.0–10.5)
nRBC: 0 % (ref 0.0–0.2)

## 2024-03-21 LAB — GLUCOSE, CAPILLARY
Glucose-Capillary: 105 mg/dL — ABNORMAL HIGH (ref 70–99)
Glucose-Capillary: 137 mg/dL — ABNORMAL HIGH (ref 70–99)
Glucose-Capillary: 98 mg/dL (ref 70–99)
Glucose-Capillary: 210 mg/dL — ABNORMAL HIGH (ref 70–99)

## 2024-03-21 MED ORDER — IOHEXOL 350 MG/ML SOLN
75.0000 mL | Freq: Once | INTRAVENOUS | Status: AC | PRN
  Administered 2024-03-21: 75 mL via INTRAVENOUS

## 2024-03-21 MED ORDER — IOHEXOL 9 MG/ML PO SOLN
500.0000 mL | ORAL | Status: AC
  Administered 2024-03-21 (×2): 500 mL via ORAL

## 2024-03-21 MED ORDER — GUAIFENESIN ER 600 MG PO TB12: 1200.0000 mg | ORAL_TABLET | Freq: Two times a day (BID) | ORAL | Status: AC

## 2024-03-21 NOTE — Telephone Encounter (Signed)
 Patient scheduled.

## 2024-03-21 NOTE — Telephone Encounter (Signed)
 Please arrange for 3 to 4-week hospital follow-up for obesity hypoventilation -With APP or establish with new sleep doc

## 2024-03-21 NOTE — Progress Notes (Signed)
 SATURATION QUALIFICATIONS: (This note is used to comply with regulatory documentation for home oxygen)  Patient Saturations on Room Air at Rest = 88%  Patient Saturations on Room Air while Ambulating = 89%  Patient Saturations on 2 Liters of oxygen while Ambulating = 96%  Please briefly explain why patient needs home oxygen:  Patient requires supplemental oxygen in order to maintain oxygen saturation level greater than 90%.

## 2024-03-21 NOTE — Plan of Care (Signed)
  Problem: Clinical Measurements: Goal: Cardiovascular complication will be avoided Outcome: Progressing   Problem: Clinical Measurements: Goal: Respiratory complications will improve Outcome: Progressing   Problem: Activity: Goal: Risk for activity intolerance will decrease Outcome: Progressing   Problem: Nutrition: Goal: Adequate nutrition will be maintained Outcome: Progressing

## 2024-03-21 NOTE — Progress Notes (Signed)
 " PROGRESS NOTE    Rokhaya Quinn  FMW:[id number] DOB: 07/29/1990 DOA: 03/14/2024 PCP: Celestia Rosaline SQUIBB, NP  Brief Narrative: 34 y.o. female with medical history significant of PE on Xarelto , hypertension, diabetes, morbid obesity, cardiomyopathy who presents with shortness of breath.  She states that she has not been feeling well for about a week.  Patient tested positive for Influenza B, chest xray showed bilateral infiltrates required Bipap, 6-8L supplemental O2 admitted to TRH service for further management and evaluation for acute hypoxic and hypercapnic respiratory failure.   Assessment & Plan:   Principal Problem:   Acute respiratory failure with hypoxia and hypercapnia (HCC) Active Problems:   Acute on chronic diastolic CHF (congestive heart failure) (HCC)   Essential hypertension   History of pulmonary embolism   Non-insulin  dependent type 2 diabetes mellitus (HCC)   OSA (obstructive sleep apnea)   Class 3 obesity (HCC)   Influenza A with pneumonia   Multifocal pneumonia   Morbid obesity with BMI of 60.0-69.9, adult (HCC)   Influenza B   Sleep apnea   Acute metabolic encephalopathy    Acute on chronic hypoxic and hypercapnic respiratory failure-patient tolerated BiPAP last night.  She feels improved oxygen requirement down to 5 L.  She says she uses oxygen at home at night unclear how much.  She does have CPAP at home which she uses most of the nights.  Urine Legionella and strep pneumo negative MRSA negative.  On Rocephin  and doxycycline  Tamiflu . Continue Brovana  and Yupelri . Encourage out of bed and ambulate the patient consult physical therapy.   Severe Sepsis secondary to multifocal pneumonia  Secondary bacterial infection from influenza B- Blood culture grew cutibacterium acnes, ID consulted, Presented with tachycardic, tachypneic with respiratory failure. Continue Rocephin , azithromycin  therapy. She finished her course of Tamiflu  Continue Bipap, supplemental  oxygen to maintain saturation greater than 92%.   Acute Metabolic encephalopathy-resolved in the setting of hypercarbia. Co2 down to 80 from 102.  BiPAP nightly and as needed.  Chronic diastolic CHF- Improved EF 60 to 65%. Continue losartan , Aldactone , torsemide  and Jardiance .   History of PE- Continue Xarelto . CTA negative for PE.   Obstructive sleep apnea on CPAP at night Continue BiPAP for now. Continue PAP device at night with oxygen.   Morbid obesity BMI 62.92. Diet, exercise and weight reduction advised.    Estimated body mass index is 62.92 kg/m as calculated from the following:   Height as of this encounter: 5' 2 (1.575 m).   Weight as of this encounter: 156 kg.  DVT prophylaxis: xarelto  Code Status: full Family Communication:  Disposition Plan:  Status is: Inpatient  Consultants: pccm  Procedures: bipap Antimicrobials: Rocephin  and azithromycin   Subjective:   Today she complains of abdominal pain With positive blood cultures noted CT PE abdomen pending Objective: Vitals:   03/21/24 0312 03/21/24 0430 03/21/24 0842 03/21/24 1322  BP:  118/80  120/79  Pulse:  74  100  Resp: 16 18  16   Temp:  97.8 F (36.6 C)  (!) 97.4 F (36.3 C)  TempSrc:  Oral  Oral  SpO2:  95% (!) 87% 100%  Weight:      Height:        Intake/Output Summary (Last 24 hours) at 03/21/2024 1756 Last data filed at 03/21/2024 0754 Gross per 24 hour  Intake 458.08 ml  Output 0 ml  Net 458.08 ml   Filed Weights   03/14/24 0858 03/14/24 1754  Weight: (!) 146.1 kg (!) 156 kg  Examination:  General exam: Appears anxious and tearful Respiratory system: Diminished breath sounds Cardiovascular system: Regular  gastrointestinal system: Abdomen is distended, soft and nontender. No organomegaly or masses felt. Normal bowel sounds heard. Central nervous system: Alert and oriented. No focal neurological deficits. Extremities: trace edema  Data Reviewed: I have personally reviewed  following labs and imaging studies  CBC: Recent Labs  Lab 03/15/24 0307 03/17/24 0317 03/18/24 0226 03/20/24 1240 03/21/24 1439  WBC 9.2 6.4 8.6 15.4* 15.3*  NEUTROABS  --  4.1  --   --   --   HGB 13.3 13.7 14.0 16.1* 15.7*  HCT 43.4 45.8 45.8 51.1* 51.1*  MCV 72.0* 72.7* 71.9* 70.1* 71.0*  PLT 365 330 325 384 323   Basic Metabolic Panel: Recent Labs  Lab 03/15/24 0307 03/17/24 0317 03/18/24 0226 03/20/24 1240 03/21/24 1439  NA 138 134* 135 136 136  K 4.3 4.7 4.4 3.9 3.6  CL 94* 89* 88* 89* 88*  CO2 39* 40* 40* 38* 37*  GLUCOSE 98 92 72 158* 119*  BUN 9 11 16 16 14   CREATININE 0.78 0.68 0.59 0.65 0.70  CALCIUM 9.2 10.1 9.6 9.7 9.9  MG  --  2.5*  --   --   --    GFR: Estimated Creatinine Clearance: 148.8 mL/min (by C-G formula based on SCr of 0.7 mg/dL). Liver Function Tests: Recent Labs  Lab 03/18/24 0226 03/20/24 1240 03/21/24 1439  AST 26 31 31   ALT 11 22 24   ALKPHOS 121 137* 136*  BILITOT 0.4 0.7 0.9  PROT 7.6 8.4* 8.5*  ALBUMIN 3.5 4.0 4.1   No results for input(s): LIPASE, AMYLASE in the last 168 hours. No results for input(s): AMMONIA in the last 168 hours. Coagulation Profile: No results for input(s): INR, PROTIME in the last 168 hours. Cardiac Enzymes: No results for input(s): CKTOTAL, CKMB, CKMBINDEX, TROPONINI in the last 168 hours. BNP (last 3 results) Recent Labs    11/18/23 1612 01/02/24 2152 03/14/24 0851  PROBNP 193.0 1,588.0* 2,173.0*   HbA1C: No results for input(s): HGBA1C in the last 72 hours.  CBG: Recent Labs  Lab 03/20/24 1630 03/20/24 2053 03/21/24 0737 03/21/24 1125 03/21/24 1628  GLUCAP 183* 86 105* 137* 98   Lipid Profile: No results for input(s): CHOL, HDL, LDLCALC, TRIG, CHOLHDL, LDLDIRECT in the last 72 hours. Thyroid Function Tests: No results for input(s): TSH, T4TOTAL, FREET4, T3FREE, THYROIDAB in the last 72 hours. Anemia Panel: No results for input(s):  VITAMINB12, FOLATE, FERRITIN, TIBC, IRON, RETICCTPCT in the last 72 hours. Sepsis Labs: No results for input(s): PROCALCITON, LATICACIDVEN in the last 168 hours.   Recent Results (from the past 240 hours)  MRSA Next Gen by PCR, Nasal     Status: None   Collection Time: 03/14/24 12:11 AM   Specimen: Nasal Mucosa; Nasal Swab  Result Value Ref Range Status   MRSA by PCR Next Gen NOT DETECTED NOT DETECTED Final    Comment: (NOTE) The GeneXpert MRSA Assay (FDA approved for NASAL specimens only), is one component of a comprehensive MRSA colonization surveillance program. It is not intended to diagnose MRSA infection nor to guide or monitor treatment for MRSA infections. Test performance is not FDA approved in patients less than 87 years old. Performed at [facility], [address] [place], [place] [address]   Resp panel by RT-PCR (RSV, Flu A&B, Covid) Anterior Nasal Swab     Status: Abnormal   Collection Time: 03/14/24  8:52 AM  Specimen: Anterior Nasal Swab  Result Value Ref Range Status   SARS Coronavirus 2 by RT PCR NEGATIVE NEGATIVE Final    Comment: (NOTE) SARS-CoV-2 target nucleic acids are NOT DETECTED.  The SARS-CoV-2 RNA is generally detectable in upper respiratory specimens during the acute phase of infection. The lowest concentration of SARS-CoV-2 viral copies this assay can detect is 138 copies/mL. A negative result does not preclude SARS-Cov-2 infection and should not be used as the sole basis for treatment or other patient management decisions. A negative result may occur with  improper specimen collection/handling, submission of specimen other than nasopharyngeal swab, presence of viral mutation(s) within the areas targeted by this assay, and inadequate number of viral copies(<138 copies/mL). A negative result must be combined with clinical observations, patient history, and epidemiological information. The expected result is  Negative.  Fact Sheet for Patients:  bloggercourse.com  Fact Sheet for Healthcare Providers:  seriousbroker.it  This test is no t yet approved or cleared by the [place]  FDA and  has been authorized for detection and/or diagnosis of SARS-CoV-2 by FDA under an Emergency Use Authorization (EUA). This EUA will remain  in effect (meaning this test can be used) for the duration of the COVID-19 declaration under Section 564(b)(1) of the Act, 21 U.S.C.section 360bbb-3(b)(1), unless the authorization is terminated  or revoked sooner.       Influenza A by PCR NEGATIVE NEGATIVE Final   Influenza B by PCR POSITIVE (A) NEGATIVE Final    Comment: (NOTE) The Xpert Xpress SARS-CoV-2/FLU/RSV plus assay is intended as an aid in the diagnosis of influenza from Nasopharyngeal swab specimens and should not be used as a sole basis for treatment. Nasal washings and aspirates are unacceptable for Xpert Xpress SARS-CoV-2/FLU/RSV testing.  Fact Sheet for Patients: bloggercourse.com  Fact Sheet for Healthcare Providers: seriousbroker.it  This test is not yet approved or cleared by the [place]  FDA and has been authorized for detection and/or diagnosis of SARS-CoV-2 by FDA under an Emergency Use Authorization (EUA). This EUA will remain in effect (meaning this test can be used) for the duration of the COVID-19 declaration under Section 564(b)(1) of the Act, 21 U.S.C. section 360bbb-3(b)(1), unless the authorization is terminated or revoked.     Resp Syncytial Virus by PCR NEGATIVE NEGATIVE Final    Comment: (NOTE) Fact Sheet for Patients: bloggercourse.com  Fact Sheet for Healthcare Providers: seriousbroker.it  This test is not yet approved or cleared by the [place]  FDA and has been authorized for detection and/or diagnosis of  SARS-CoV-2 by FDA under an Emergency Use Authorization (EUA). This EUA will remain in effect (meaning this test can be used) for the duration of the COVID-19 declaration under Section 564(b)(1) of the Act, 21 U.S.C. section 360bbb-3(b)(1), unless the authorization is terminated or revoked.  Performed at [facility], [address] [place], [place] [address]   Blood culture (routine x 2)     Status: Abnormal   Collection Time: 03/14/24 10:15 AM   Specimen: BLOOD  Result Value Ref Range Status   Specimen Description   Final    BLOOD RIGHT ANTECUBITAL Performed at [facility], [address] [place], [place] [address]    Special Requests   Final    BOTTLES DRAWN AEROBIC AND ANAEROBIC Blood Culture results may not be optimal due to an inadequate volume of blood received in culture bottles Performed at [facility], [address], [place], [place] [address]  Culture  Setup Time   Final    GRAM POSITIVE RODS ANAEROBIC BOTTLE ONLY CRITICAL RESULT CALLED TO, READ BACK BY AND VERIFIED WITH: PHARMD D. [id number] @ 2058 FH    Culture (A)  Final    CUTIBACTERIUM ACNES Standardized susceptibility testing for this organism is not available. Performed at [facility], [address] [place], [place] [address]    Report Status 03/20/2024 FINAL  Final  Blood culture (routine x 2)     Status: Abnormal   Collection Time: 03/14/24 10:20 AM   Specimen: BLOOD  Result Value Ref Range Status   Specimen Description   Final    BLOOD BLOOD LEFT HAND Performed at [facility], [address] [place], [place] [address]    Special Requests   Final    BOTTLES DRAWN AEROBIC AND ANAEROBIC Blood Culture adequate volume Performed at [facility], [address] [place], [place] [address]    Culture  Setup Time   Final    GRAM POSITIVE RODS ANAEROBIC BOTTLE ONLY CRITICAL RESULT CALLED TO, READ BACK BY AND VERIFIED WITH:  MITCHELL  LILLISTONPHARMD 03/19/2024 BY DD @ [id number]    Culture (A)  Final    CUTIBACTERIUM ACNES Standardized susceptibility testing for this organism is not available. Performed at [facility], [address] [place], [place] [address]    Report Status 03/21/2024 FINAL  Final  Culture, blood (Routine X 2) w Reflex to ID Panel     Status: None (Preliminary result)   Collection Time: 03/21/24  2:39 PM   Specimen: BLOOD RIGHT ARM  Result Value Ref Range Status   Specimen Description   Final    BLOOD RIGHT ARM Performed at [facility], [address] [place], [place] [address]    Special Requests   Final    BOTTLES DRAWN AEROBIC ONLY Blood Culture results may not be optimal due to an inadequate volume of blood received in culture bottles Performed at [facility], [address], [place], [place] [address]    Culture PENDING  Incomplete   Report Status PENDING  Incomplete  Culture, blood (Routine X 2) w Reflex to ID Panel     Status: None (Preliminary result)   Collection Time: 03/21/24  2:44 PM   Specimen: BLOOD RIGHT HAND  Result Value Ref Range Status   Specimen Description   Final    BLOOD RIGHT HAND Performed at [facility], [address], [place], [place] [address]    Special Requests   Final    BOTTLES DRAWN AEROBIC ONLY Blood Culture results may not be optimal due to an inadequate volume of blood received in culture bottles Performed at [facility], [address] [place], [place] [address]    Culture PENDING  Incomplete   Report Status PENDING  Incomplete     Radiology Studies: No results found.    Scheduled Meds:  arformoterol   15 mcg Nebulization BID   Chlorhexidine  Gluconate Cloth  6 each Topical Q2200   empagliflozin   10 mg Oral Daily   Gerhardt's butt cream   Topical BID   guaiFENesin   1,200 mg Oral BID   insulin  aspart  0-15 Units Subcutaneous TID WC   losartan   25 mg Oral QPM   mouth rinse  15 mL Mouth  Rinse 4 times per day   revefenacin   175 mcg Nebulization Daily   rivaroxaban   20 mg  Oral QPC supper   sodium chloride  flush  10-40 mL Intracatheter Q12H   sodium chloride  flush  3 mL Intravenous Q12H   sodium chloride  flush  3 mL Intravenous Q12H   spironolactone   25 mg Oral QPM   torsemide   20 mg Oral Daily   Continuous Infusions:  promethazine  (PHENERGAN ) injection (IM or IVPB) Stopped (03/15/24 1337)     LOS: 7 days    Almarie KANDICE Hoots, MD  03/21/2024, 5:56 PM   "

## 2024-03-21 NOTE — TOC Transition Note (Signed)
 Transition of Care [facility]) - Discharge Note   Patient Details  Name: Christine Singleton MRN: [id number] Date of Birth: 04/14/1990  [facility] CM/SW Contact:  Bascom Service, RN Phone Number: 03/21/2024, 10:19 AM   Clinical Narrative: spoke to patient about d/c plans-No HHC agency to accept-patient agrees to otpt PT;Already active w/[employer] for home 02-new 02 sats in qualifies for home 02 continuous-await orders prior delivery home 02 travel tank to rm prior d/c. Has own transport home.      Final next level of care: Home/Self Care Barriers to Discharge: No Barriers Identified   Patient Goals and CMS Choice Patient states their goals for this hospitalization and ongoing recovery are:: Home CMS Medicare.gov Compare Post Acute Care list provided to:: Patient Choice offered to / list presented to : Patient [place] ownership interest in [facility].provided to:: Patient    Discharge Placement                       Discharge Plan and Services Additional resources added to the After Visit Summary for   In-house Referral: NA Discharge Planning Services: CM Consult Post Acute Care Choice: Resumption of Svcs/PTA Provider          DME Arranged: Oxygen DME Agency: [employer] Date DME Agency Contacted: 03/21/24 Time DME Agency Contacted: 1019 Representative spoke with at DME Agency: Jermaine            Social Drivers of Health (SDOH) Interventions SDOH Screenings   Food Insecurity: No Food Insecurity (03/14/2024)  Recent Concern: Food Insecurity - Food Insecurity Present (01/03/2024)  Housing: High Risk (03/14/2024)  Transportation Needs: No Transportation Needs (03/14/2024)  Utilities: At Risk (03/14/2024)  Alcohol Screen: Low Risk (08/18/2022)  Depression (PHQ2-9): Low Risk (11/23/2023)  Financial Resource Strain: High Risk (09/08/2022)  Social Connections: Unknown (01/03/2024)  Tobacco Use: Low Risk (03/14/2024)     Readmission Risk  Interventions    03/17/2024    4:02 PM 01/04/2024   10:11 AM 11/20/2023   12:48 PM  Readmission Risk Prevention Plan  Transportation Screening Complete Complete Complete  PCP or Specialist Appt within 5-7 Days Complete  Complete  Home Care Screening Complete Complete Complete  Medication Review (RN CM) Complete Complete Complete

## 2024-03-22 DIAGNOSIS — J9602 Acute respiratory failure with hypercapnia: Secondary | ICD-10-CM | POA: Diagnosis not present

## 2024-03-22 DIAGNOSIS — J9601 Acute respiratory failure with hypoxia: Secondary | ICD-10-CM | POA: Diagnosis not present

## 2024-03-22 LAB — CBC
HCT: 47.6 % — ABNORMAL HIGH (ref 36.0–46.0)
Hemoglobin: 15.1 g/dL — ABNORMAL HIGH (ref 12.0–15.0)
MCH: 21.9 pg — ABNORMAL LOW (ref 26.0–34.0)
MCHC: 31.7 g/dL (ref 30.0–36.0)
MCV: 69.1 fL — ABNORMAL LOW (ref 80.0–100.0)
Platelets: 335 K/uL (ref 150–400)
RBC: 6.89 MIL/uL — ABNORMAL HIGH (ref 3.87–5.11)
RDW: 20.6 % — ABNORMAL HIGH (ref 11.5–15.5)
WBC: 12.7 K/uL — ABNORMAL HIGH (ref 4.0–10.5)
nRBC: 0 % (ref 0.0–0.2)

## 2024-03-22 LAB — BLOOD CULTURE ID PANEL (REFLEXED) - BCID2

## 2024-03-22 LAB — COMPREHENSIVE METABOLIC PANEL WITH GFR
ALT: 21 U/L (ref 0–44)
AST: 24 U/L (ref 15–41)
Albumin: 3.7 g/dL (ref 3.5–5.0)
Alkaline Phosphatase: 115 U/L (ref 38–126)
Anion gap: 11 (ref 5–15)
BUN: 12 mg/dL (ref 6–20)
CO2: 33 mmol/L — ABNORMAL HIGH (ref 22–32)
Calcium: 9.5 mg/dL (ref 8.9–10.3)
Chloride: 89 mmol/L — ABNORMAL LOW (ref 98–111)
Creatinine, Ser: 0.69 mg/dL (ref 0.44–1.00)
GFR, Estimated: >60 mL/min
Glucose, Bld: 194 mg/dL — ABNORMAL HIGH (ref 70–99)
Potassium: 3.6 mmol/L (ref 3.5–5.1)
Sodium: 133 mmol/L — ABNORMAL LOW (ref 135–145)
Total Bilirubin: 0.9 mg/dL (ref 0.0–1.2)
Total Protein: 7.3 g/dL (ref 6.5–8.1)

## 2024-03-22 LAB — GLUCOSE, CAPILLARY
Glucose-Capillary: 135 mg/dL — ABNORMAL HIGH (ref 70–99)
Glucose-Capillary: 199 mg/dL — ABNORMAL HIGH (ref 70–99)
Glucose-Capillary: 99 mg/dL (ref 70–99)
Glucose-Capillary: 146 mg/dL — ABNORMAL HIGH (ref 70–99)

## 2024-03-22 LAB — TROPONIN T, HIGH SENSITIVITY
Troponin T High Sensitivity: <15 ng/L (ref 0–19)
Troponin T High Sensitivity: <15 ng/L (ref 0–19)

## 2024-03-22 MED ORDER — SODIUM CHLORIDE 0.9 % IV SOLN: INTRAVENOUS | Status: DC

## 2024-03-22 NOTE — Progress Notes (Addendum)
 PT Cancellation Note  Patient Details Name: Makari Sanko MRN: [id number] DOB: 21-May-1990   Cancelled Treatment:    Reason Eval/Treat Not Completed:  Attempted tx session-pt declined participaton with PT-pt stated it's hard for me to breathe right now. O2 sats were 90-92% at rest on O2 prior to my exit. Made RN aware. Nursing reports patient mobilizes independently in room.     Dannial SQUIBB, PT Acute Rehabilitation  Office: [phone]

## 2024-03-22 NOTE — Progress Notes (Signed)
 " PROGRESS NOTE    Christine Singleton  FMW:[id number] DOB: 20-May-1990 DOA: 03/14/2024 PCP: Celestia Rosaline SQUIBB, NP  Brief Narrative: 34 y.o. female with medical history significant of PE on Xarelto , hypertension, diabetes, morbid obesity, heart failure with mildly reduced ejection fraction, obstructive sleep apnea, cardiomyopathy who presents with shortness of breath.  She states that she has not been feeling well for about a week.  Patient tested positive for Influenza B, chest xray showed bilateral infiltrates required Bipap, 6-8L supplemental O2 admitted to [facility] service for further management and evaluation for acute hypoxic and hypercapnic respiratory failure. She follows up at the advanced heart failure clinic.  Assessment & Plan:   Principal Problem:   Acute respiratory failure with hypoxia and hypercapnia (HCC) Active Problems:   Acute on chronic diastolic CHF (congestive heart failure) (HCC)   Essential hypertension   History of pulmonary embolism   Non-insulin  dependent type 2 diabetes mellitus (HCC)   OSA (obstructive sleep apnea)   Class 3 obesity (HCC)   Influenza A with pneumonia   Multifocal pneumonia   Morbid obesity with BMI of 60.0-69.9, adult (HCC)   Influenza B   Sleep apnea   Acute metabolic encephalopathy    Acute on chronic hypoxic and hypercapnic respiratory failure-secondary to multifocal pneumonia and influenza B.  She was treated with Rocephin  and azithromycin  BiPAP and Tamiflu .  She finished a course of all the 3.  He uses BiPAP at night and as needed.  She does have CPAP at home which she uses most of the nights. Urine Legionella and strep pneumo negative MRSA negative.   Continue Brovana  and Yupelri . Encourage out of bed and ambulate the patient consult physical therapy.  Refused physical therapy today.   Severe Sepsis secondary to multifocal pneumonia  Secondary bacterial infection from influenza B-resolved. Blood culture grew cutibacterium acnes, ID  consulted, and following(discussed with Dr. Manandar today obtain blood cultures tomorrow which will be 48 hours after the last dose of antibiotics on 03/20/2024. Presented with tachycardic, tachypneic with respiratory failure. She finished her course of Tamiflu  Rocephin  and azithromycin . Continue Bipap, supplemental oxygen to maintain saturation greater than 92%.   Acute Metabolic encephalopathy-resolved in the setting of hypercarbia.BiPAP nightly and as needed.  Chronic diastolic CHF- Improved EF 60 to 65%. 03/22/24 bp 83/55 on losartan  Aldactone  and torsemide  Will hold above medications for 24 hours and reassess.  Restart when able to tolerate.  I am giving her IV fluids for 12 hours. Continue Jardiance .   Atypical chest pain-throughout her hospital stay she has been getting on and off chest pain EKG with no acute changes compared to the prior EKGs. Check troponin 03/22/2024 and reassess  History of PE- Continue Xarelto . CTA negative for PE.   Obstructive sleep apnea on CPAP at night Continue BiPAP for now.   Morbid obesity BMI 62.92. Diet, exercise and weight reduction advised.    Estimated body mass index is 62.92 kg/m as calculated from the following:   Height as of this encounter: 5' 2 (1.575 m).   Weight as of this encounter: 156 kg.  DVT prophylaxis: xarelto  Code Status: full Family Communication:  Disposition Plan:  Status is: Inpatient  Consultants: pccm  Procedures: bipap Antimicrobials: Rocephin  and azithromycin   Subjective:  CT abdomen no acute findings.  Patient complained of atypical chest pain pain increases with cough and breathing.  CT of the chest this admission showed no evidence of PE already on Xarelto .  Yesterday she complained of abdominal pain and a CT of  the abdomen did not show any acute findings.  She will need 2 sets of blood cultures done tomorrow just to make sure blood cultures are not growing cutibacterium acne  Objective: Vitals:    03/22/24 0502 03/22/24 0857 03/22/24 0858 03/22/24 1148  BP: 129/85   (!) 83/55  Pulse: 88   (!) 107  Resp: 20   20  Temp: 97.8 F (36.6 C)   98.3 F (36.8 C)  TempSrc: Oral   Oral  SpO2: 100% 100% 99% 96%  Weight:      Height:        Intake/Output Summary (Last 24 hours) at 03/22/2024 1315 Last data filed at 03/22/2024 0300 Gross per 24 hour  Intake 243 ml  Output 0 ml  Net 243 ml   Filed Weights   03/14/24 0858 03/14/24 1754  Weight: (!) 146.1 kg (!) 156 kg    Examination:  General exam: Appears anxious  Respiratory system: Diminished breath sounds Cardiovascular system: Regular  gastrointestinal system: Abdomen is distended, soft and nontender. No organomegaly or masses felt. Normal bowel sounds heard. Central nervous system: Alert and oriented. No focal neurological deficits. Extremities: trace edema  Data Reviewed: I have personally reviewed following labs and imaging studies  CBC: Recent Labs  Lab 03/17/24 0317 03/18/24 0226 03/20/24 1240 03/21/24 1439 03/22/24 1107  WBC 6.4 8.6 15.4* 15.3* 12.7*  NEUTROABS 4.1  --   --   --   --   HGB 13.7 14.0 16.1* 15.7* 15.1*  HCT 45.8 45.8 51.1* 51.1* 47.6*  MCV 72.7* 71.9* 70.1* 71.0* 69.1*  PLT 330 325 384 323 335   Basic Metabolic Panel: Recent Labs  Lab 03/17/24 0317 03/18/24 0226 03/20/24 1240 03/21/24 1439 03/22/24 1107  NA 134* 135 136 136 133*  K 4.7 4.4 3.9 3.6 3.6  CL 89* 88* 89* 88* 89*  CO2 40* 40* 38* 37* 33*  GLUCOSE 92 72 158* 119* 194*  BUN 11 16 16 14 12   CREATININE 0.68 0.59 0.65 0.70 0.69  CALCIUM 10.1 9.6 9.7 9.9 9.5  MG 2.5*  --   --   --   --    GFR: Estimated Creatinine Clearance: 148.8 mL/min (by C-G formula based on SCr of 0.69 mg/dL). Liver Function Tests: Recent Labs  Lab 03/18/24 0226 03/20/24 1240 03/21/24 1439 03/22/24 1107  AST 26 31 31 24   ALT 11 22 24 21   ALKPHOS 121 137* 136* 115  BILITOT 0.4 0.7 0.9 0.9  PROT 7.6 8.4* 8.5* 7.3  ALBUMIN 3.5 4.0 4.1 3.7   No  results for input(s): LIPASE, AMYLASE in the last 168 hours. No results for input(s): AMMONIA in the last 168 hours. Coagulation Profile: No results for input(s): INR, PROTIME in the last 168 hours. Cardiac Enzymes: No results for input(s): CKTOTAL, CKMB, CKMBINDEX, TROPONINI in the last 168 hours. BNP (last 3 results) Recent Labs    11/18/23 1612 01/02/24 2152 03/14/24 0851  PROBNP 193.0 1,588.0* 2,173.0*   HbA1C: No results for input(s): HGBA1C in the last 72 hours.  CBG: Recent Labs  Lab 03/21/24 1125 03/21/24 1628 03/21/24 2026 03/22/24 0743 03/22/24 1106  GLUCAP 137* 98 210* 135* 199*   Lipid Profile: No results for input(s): CHOL, HDL, LDLCALC, TRIG, CHOLHDL, LDLDIRECT in the last 72 hours. Thyroid Function Tests: No results for input(s): TSH, T4TOTAL, FREET4, T3FREE, THYROIDAB in the last 72 hours. Anemia Panel: No results for input(s): VITAMINB12, FOLATE, FERRITIN, TIBC, IRON, RETICCTPCT in the last 72 hours. Sepsis Labs: No  results for input(s): PROCALCITON, LATICACIDVEN in the last 168 hours.   Recent Results (from the past 240 hours)  MRSA Next Gen by PCR, Nasal     Status: None   Collection Time: 03/14/24 12:11 AM   Specimen: Nasal Mucosa; Nasal Swab  Result Value Ref Range Status   MRSA by PCR Next Gen NOT DETECTED NOT DETECTED Final    Comment: (NOTE) The GeneXpert MRSA Assay (FDA approved for NASAL specimens only), is one component of a comprehensive MRSA colonization surveillance program. It is not intended to diagnose MRSA infection nor to guide or monitor treatment for MRSA infections. Test performance is not FDA approved in patients less than 32 years old. Performed at [facility], [address], [place], [place] [address]   Resp panel by RT-PCR (RSV, Flu A&B, Covid) Anterior Nasal Swab     Status: Abnormal   Collection Time: 03/14/24  8:52 AM   Specimen: Anterior  Nasal Swab  Result Value Ref Range Status   SARS Coronavirus 2 by RT PCR NEGATIVE NEGATIVE Final    Comment: (NOTE) SARS-CoV-2 target nucleic acids are NOT DETECTED.  The SARS-CoV-2 RNA is generally detectable in upper respiratory specimens during the acute phase of infection. The lowest concentration of SARS-CoV-2 viral copies this assay can detect is 138 copies/mL. A negative result does not preclude SARS-Cov-2 infection and should not be used as the sole basis for treatment or other patient management decisions. A negative result may occur with  improper specimen collection/handling, submission of specimen other than nasopharyngeal swab, presence of viral mutation(s) within the areas targeted by this assay, and inadequate number of viral copies(<138 copies/mL). A negative result must be combined with clinical observations, patient history, and epidemiological information. The expected result is Negative.  Fact Sheet for Patients:  bloggercourse.com  Fact Sheet for Healthcare Providers:  seriousbroker.it  This test is no t yet approved or cleared by the [place]  FDA and  has been authorized for detection and/or diagnosis of SARS-CoV-2 by FDA under an Emergency Use Authorization (EUA). This EUA will remain  in effect (meaning this test can be used) for the duration of the COVID-19 declaration under Section 564(b)(1) of the Act, 21 U.S.C.section 360bbb-3(b)(1), unless the authorization is terminated  or revoked sooner.       Influenza A by PCR NEGATIVE NEGATIVE Final   Influenza B by PCR POSITIVE (A) NEGATIVE Final    Comment: (NOTE) The Xpert Xpress SARS-CoV-2/FLU/RSV plus assay is intended as an aid in the diagnosis of influenza from Nasopharyngeal swab specimens and should not be used as a sole basis for treatment. Nasal washings and aspirates are unacceptable for Xpert Xpress SARS-CoV-2/FLU/RSV testing.  Fact Sheet  for Patients: bloggercourse.com  Fact Sheet for Healthcare Providers: seriousbroker.it  This test is not yet approved or cleared by the [place]  FDA and has been authorized for detection and/or diagnosis of SARS-CoV-2 by FDA under an Emergency Use Authorization (EUA). This EUA will remain in effect (meaning this test can be used) for the duration of the COVID-19 declaration under Section 564(b)(1) of the Act, 21 U.S.C. section 360bbb-3(b)(1), unless the authorization is terminated or revoked.     Resp Syncytial Virus by PCR NEGATIVE NEGATIVE Final    Comment: (NOTE) Fact Sheet for Patients: bloggercourse.com  Fact Sheet for Healthcare Providers: seriousbroker.it  This test is not yet approved or cleared by the [place]  FDA and has been authorized for detection and/or diagnosis of SARS-CoV-2 by FDA under an  Emergency Use Authorization (EUA). This EUA will remain in effect (meaning this test can be used) for the duration of the COVID-19 declaration under Section 564(b)(1) of the Act, 21 U.S.C. section 360bbb-3(b)(1), unless the authorization is terminated or revoked.  Performed at [facility], [address] [place], [place] [address]   Blood culture (routine x 2)     Status: Abnormal   Collection Time: 03/14/24 10:15 AM   Specimen: BLOOD  Result Value Ref Range Status   Specimen Description   Final    BLOOD RIGHT ANTECUBITAL Performed at [facility], [address] [place], [place] [address]    Special Requests   Final    BOTTLES DRAWN AEROBIC AND ANAEROBIC Blood Culture results may not be optimal due to an inadequate volume of blood received in culture bottles Performed at [facility], [address] [place], [place] [address]    Culture  Setup Time   Final    GRAM POSITIVE RODS ANAEROBIC BOTTLE ONLY CRITICAL RESULT CALLED  TO, READ BACK BY AND VERIFIED WITH: PHARMD D. [id number] @ 2058 FH    Culture (A)  Final    CUTIBACTERIUM ACNES Standardized susceptibility testing for this organism is not available. Performed at [facility], [address], [place], [place] [address]    Report Status 03/20/2024 FINAL  Final  Blood culture (routine x 2)     Status: Abnormal   Collection Time: 03/14/24 10:20 AM   Specimen: BLOOD  Result Value Ref Range Status   Specimen Description   Final    BLOOD BLOOD LEFT HAND Performed at [facility], [address] [place], [place] [address]    Special Requests   Final    BOTTLES DRAWN AEROBIC AND ANAEROBIC Blood Culture adequate volume Performed at [facility], [address] [place], [place] [address]    Culture  Setup Time   Final    GRAM POSITIVE RODS ANAEROBIC BOTTLE ONLY CRITICAL RESULT CALLED TO, READ BACK BY AND VERIFIED WITH:  MITCHELL LILLISTONPHARMD 03/19/2024 BY DD @ [phone]    Culture (A)  Final    CUTIBACTERIUM ACNES Standardized susceptibility testing for this organism is not available. Performed at [facility], [address] [place], [place] [address]    Report Status 03/21/2024 FINAL  Final  Culture, blood (Routine X 2) w Reflex to ID Panel     Status: None (Preliminary result)   Collection Time: 03/21/24  2:39 PM   Specimen: BLOOD RIGHT ARM  Result Value Ref Range Status   Specimen Description   Final    BLOOD RIGHT ARM Performed at [facility], [address] [place], [place] [address]    Special Requests   Final    BOTTLES DRAWN AEROBIC ONLY Blood Culture results may not be optimal due to an inadequate volume of blood received in culture bottles Performed at [facility], [address] [place], [place] [address]    Culture   Final    NO GROWTH < 24 HOURS Performed at [facility], [address], [place], [place] [address]    Report Status PENDING  Incomplete   Culture, blood (Routine X 2) w Reflex to ID Panel     Status: None (Preliminary result)   Collection Time: 03/21/24  2:44 PM   Specimen: BLOOD RIGHT HAND  Result Value Ref Range Status  Specimen Description   Final    BLOOD RIGHT HAND Performed at [facility], [address], [place], [place] [address]    Special Requests   Final    BOTTLES DRAWN AEROBIC ONLY Blood Culture results may not be optimal due to an inadequate volume of blood received in culture bottles Performed at [facility], [address], [place], [place] [address]    Culture   Final    NO GROWTH < 24 HOURS Performed at [facility], [address] [place], [place] [address]    Report Status PENDING  Incomplete     Radiology Studies: CT ABDOMEN PELVIS W CONTRAST Result Date: 03/21/2024 EXAM: CT ABDOMEN AND PELVIS WITH CONTRAST 03/21/2024 06:54:33 PM TECHNIQUE: CT of the abdomen and pelvis was performed with the administration of 75 mL of iohexol  (OMNIPAQUE ) 350 MG/ML injection. Multiplanar reformatted images are provided for review. Automated exposure control, iterative reconstruction, and/or weight-based adjustment of the mA/kV was utilized to reduce the radiation dose to as low as reasonably achievable. COMPARISON: None available. CLINICAL HISTORY: Abdominal pain, acute, nonlocalized. FINDINGS: LOWER CHEST: Subsegmental atelectasis in the left lower lobe. Posterior bibasilar dependent atelectasis. LIVER: The liver is unremarkable. GALLBLADDER AND BILE DUCTS: Gallbladder is unremarkable. No biliary ductal dilatation. SPLEEN: No acute abnormality. PANCREAS: No acute abnormality. ADRENAL GLANDS: No acute abnormality. KIDNEYS, URETERS AND BLADDER: No stones in the kidneys or ureters. No hydronephrosis. No perinephric or periureteral stranding. Urinary bladder is unremarkable. GI AND BOWEL: Stomach demonstrates no acute abnormality. There is no bowel obstruction. Hyperdense material throughout the  colon, likely ingested material. Normal appendix. PERITONEUM AND RETROPERITONEUM: No ascites. No free air. No free pelvic fluid. VASCULATURE: Aorta is normal in caliber. LYMPH NODES: No lymphadenopathy. REPRODUCTIVE ORGANS: The uterus and ovaries are within normal limits for patients age. BONES AND SOFT TISSUES: No acute osseous abnormality. No focal soft tissue abnormality. IMPRESSION: 1. No acute findings in the abdomen or pelvis. Electronically signed by: Rogelia Myers MD 03/21/2024 09:35 PM EST RP Workstation: [id number]      Scheduled Meds:  arformoterol   15 mcg Nebulization BID   Chlorhexidine  Gluconate Cloth  6 each Topical Q2200   empagliflozin   10 mg Oral Daily   Gerhardt's butt cream   Topical BID   guaiFENesin   1,200 mg Oral BID   insulin  aspart  0-15 Units Subcutaneous TID WC   losartan   25 mg Oral QPM   mouth rinse  15 mL Mouth Rinse 4 times per day   revefenacin   175 mcg Nebulization Daily   rivaroxaban   20 mg Oral QPC supper   sodium chloride  flush  10-40 mL Intracatheter Q12H   sodium chloride  flush  3 mL Intravenous Q12H   sodium chloride  flush  3 mL Intravenous Q12H   spironolactone   25 mg Oral QPM   torsemide   20 mg Oral Daily   Continuous Infusions:  promethazine  (PHENERGAN ) injection (IM or IVPB) Stopped (03/15/24 1337)     LOS: 8 days    Christine KANDICE Hoots, MD  03/22/2024, 1:15 PM   "

## 2024-03-22 NOTE — Plan of Care (Signed)
" °  Problem: Clinical Measurements: Goal: Will remain free from infection Outcome: Progressing   Problem: Nutrition: Goal: Adequate nutrition will be maintained Outcome: Progressing   Problem: Coping: Goal: Level of anxiety will decrease Outcome: Progressing   Problem: Elimination: Goal: Will not experience complications related to urinary retention Outcome: Progressing   Problem: Pain Managment: Goal: General experience of comfort will improve and/or be controlled Outcome: Progressing   "

## 2024-03-22 NOTE — Progress Notes (Signed)
" °   03/21/24 2355  BiPAP/CPAP/SIPAP  BiPAP/CPAP/SIPAP Pt Type Adult  BiPAP/CPAP/SIPAP SERVO  Mask Type Full face mask  Dentures removed? Not applicable  Mask Size Large  Set Rate 22 breaths/min  Respiratory Rate 26 breaths/min  IPAP 24 cmH20  EPAP 10 cmH2O  FiO2 (%) 40 %  Minute Ventilation 8.1  Leak 8  Peak Inspiratory Pressure (PIP) 24  Tidal Volume (Vt) 601  Patient Home Machine No  Patient Home Mask No  Patient Home Tubing No  Auto Titrate No  Press High Alarm 30 cmH2O  Press Low Alarm 5 cmH2O  CPAP/SIPAP surface wiped down Yes  Device Plugged into RED Power Outlet Yes  Oxygen Percent 40 %  BiPAP/CPAP /SiPAP Vitals  Bilateral Breath Sounds Diminished    "

## 2024-03-22 NOTE — Progress Notes (Signed)
 Approx. 1130 patient reported she was having chest pain after ambulating to the bathroom. An EKG and VS were obtained after notifying MD and charge nurse of patient's status. Patient remained alert and oriented. Supplemental oxygen administered @ 2L via nasal cannula. HOB elevated. Attending MD came bedside to assess patient. New orders generated. Will continue to monitor.

## 2024-03-22 NOTE — Progress Notes (Signed)
 PHARMACY - PHYSICIAN COMMUNICATION CRITICAL VALUE ALERT - BLOOD CULTURE IDENTIFICATION (BCID)  Saraiah Bhat is an 34 y.o. female who presented to [facility] on 03/14/2024 with a chief complaint of PNA/Flu.  Assessment:  1 out 4 BC + MRSE   Name of physician (or Provider) Contacted: Lynwood Kipper APP  Current antibiotics: None  Changes to prescribed antibiotics recommended:  No change. Possible contaminant.  Results for orders placed or performed during the hospital encounter of 03/14/24  Blood Culture ID Panel (Reflexed) (Collected: 03/21/2024  2:39 PM)  Result Value Ref Range   Enterococcus faecalis NOT DETECTED NOT DETECTED   Enterococcus Faecium NOT DETECTED NOT DETECTED   Listeria monocytogenes NOT DETECTED NOT DETECTED   Staphylococcus species DETECTED (A) NOT DETECTED   Staphylococcus aureus (BCID) NOT DETECTED NOT DETECTED   Staphylococcus epidermidis DETECTED (A) NOT DETECTED   Staphylococcus lugdunensis NOT DETECTED NOT DETECTED   Streptococcus species NOT DETECTED NOT DETECTED   Streptococcus agalactiae NOT DETECTED NOT DETECTED   Streptococcus pneumoniae NOT DETECTED NOT DETECTED   Streptococcus pyogenes NOT DETECTED NOT DETECTED   A.calcoaceticus-baumannii NOT DETECTED NOT DETECTED   Bacteroides fragilis NOT DETECTED NOT DETECTED   Enterobacterales NOT DETECTED NOT DETECTED   Enterobacter cloacae complex NOT DETECTED NOT DETECTED   Escherichia coli NOT DETECTED NOT DETECTED   Klebsiella aerogenes NOT DETECTED NOT DETECTED   Klebsiella oxytoca NOT DETECTED NOT DETECTED   Klebsiella pneumoniae NOT DETECTED NOT DETECTED   Proteus species NOT DETECTED NOT DETECTED   Salmonella species NOT DETECTED NOT DETECTED   Serratia marcescens NOT DETECTED NOT DETECTED   Haemophilus influenzae NOT DETECTED NOT DETECTED   Neisseria meningitidis NOT DETECTED NOT DETECTED   Pseudomonas aeruginosa NOT DETECTED NOT DETECTED   Stenotrophomonas maltophilia NOT DETECTED NOT DETECTED    Candida albicans NOT DETECTED NOT DETECTED   Candida auris NOT DETECTED NOT DETECTED   Candida glabrata NOT DETECTED NOT DETECTED   Candida krusei NOT DETECTED NOT DETECTED   Candida parapsilosis NOT DETECTED NOT DETECTED   Candida tropicalis NOT DETECTED NOT DETECTED   Cryptococcus neoformans/gattii NOT DETECTED NOT DETECTED   Methicillin resistance mecA/C DETECTED (A) NOT DETECTED   Evelina Lore Karoline Marina, PharmD, BCPS Clinical Staff Pharmacist Marina Salines Stillinger 03/22/2024  8:26 PM

## 2024-03-22 NOTE — Progress Notes (Signed)
" °   03/22/24 1148  Assess: MEWS Score  Temp 98.3 F (36.8 C)  BP (!) 83/55  MAP (mmHg) (!) 64  Pulse Rate (!) 107  Resp 20  SpO2 96 %  Assess: MEWS Score  MEWS Temp 0  MEWS Systolic 1  MEWS Pulse 1  MEWS RR 0  MEWS LOC 0  MEWS Score 2  MEWS Score Color Yellow  Assess: if the MEWS score is Yellow or Red  Were vital signs accurate and taken at a resting state? Yes  Does the patient meet 2 or more of the SIRS criteria? No  MEWS guidelines implemented  Yes, yellow  Treat  MEWS Interventions Considered administering scheduled or prn medications/treatments as ordered  Take Vital Signs  Increase Vital Sign Frequency  Yellow: Q2hr x1, continue Q4hrs until patient remains green for 12hrs  Escalate  MEWS: Escalate Yellow: Discuss with charge nurse and consider notifying provider and/or RRT  Notify: Charge Nurse/RN  Name of Charge Nurse/RN Notified Harlene Cobia, RN  Provider Notification  Provider Name/Title Dr. Will  Date Provider Notified 03/22/24  Time Provider Notified 1148  Method of Notification Face-to-face  Notification Reason Change in status  Provider response At bedside  Date of Provider Response 03/22/24  Time of Provider Response 1148  Assess: SIRS CRITERIA  SIRS Temperature  0  SIRS Respirations  0  SIRS Pulse 1  SIRS WBC 0  SIRS Score Sum  1   YELLOWS MEWS implemented. MD and CN are aware of patient's change in status.  "

## 2024-03-23 DIAGNOSIS — J9602 Acute respiratory failure with hypercapnia: Secondary | ICD-10-CM | POA: Diagnosis not present

## 2024-03-23 DIAGNOSIS — J9601 Acute respiratory failure with hypoxia: Secondary | ICD-10-CM | POA: Diagnosis not present

## 2024-03-23 LAB — GLUCOSE, CAPILLARY
Glucose-Capillary: 152 mg/dL — ABNORMAL HIGH (ref 70–99)
Glucose-Capillary: 137 mg/dL — ABNORMAL HIGH (ref 70–99)

## 2024-03-23 NOTE — TOC Transition Note (Signed)
 Transition of Care [facility]) - Discharge Note   Patient Details  Name: Christine Singleton MRN: [id number] Date of Birth: May 29, 1990  Transition of Care [facility]) CM/SW Contact:  Bascom Service, RN Phone Number: 03/23/2024, 10:51 AM   Clinical Narrative:Qualified for home 02 continuous-already active w/[employer] rep Jermaine to deliver travel tank to rm prior d/c. Otpt PT. Has own transport home.No further CM needs.       Final next level of care: OP Rehab Barriers to Discharge: No Barriers Identified   Patient Goals and CMS Choice Patient states their goals for this hospitalization and ongoing recovery are:: Home CMS Medicare.gov Compare Post Acute Care list provided to:: Patient Choice offered to / list presented to : Patient Enhaut ownership interest in [facility].provided to:: Patient    Discharge Placement                       Discharge Plan and Services Additional resources added to the After Visit Summary for   In-house Referral: NA Discharge Planning Services: CM Consult Post Acute Care Choice: Durable Medical Equipment          DME Arranged: Oxygen DME Agency: [employer] Date DME Agency Contacted: 03/23/24 Time DME Agency Contacted: 1051 Representative spoke with at DME Agency: [place]            Social Drivers of Health (SDOH) Interventions SDOH Screenings   Food Insecurity: No Food Insecurity (03/14/2024)  Recent Concern: Food Insecurity - Food Insecurity Present (01/03/2024)  Housing: High Risk (03/14/2024)  Transportation Needs: No Transportation Needs (03/14/2024)  Utilities: At Risk (03/14/2024)  Alcohol Screen: Low Risk (08/18/2022)  Depression (PHQ2-9): Low Risk (11/23/2023)  Financial Resource Strain: High Risk (09/08/2022)  Social Connections: Unknown (01/03/2024)  Tobacco Use: Low Risk (03/14/2024)     Readmission Risk Interventions    03/17/2024    4:02 PM 01/04/2024   10:11 AM 11/20/2023   12:48 PM  Readmission Risk  Prevention Plan  Transportation Screening Complete Complete Complete  PCP or Specialist Appt within 5-7 Days Complete  Complete  Home Care Screening Complete Complete Complete  Medication Review (RN CM) Complete Complete Complete

## 2024-03-23 NOTE — Progress Notes (Signed)
 Discharge instructions reviewed with patient, verbalized understanding. All questions answered. All belongings accounted for. Patient to follow up with MD in  1-2 weeks. PIV removed. Assisted via WC to private vehicle.

## 2024-03-23 NOTE — Discharge Summary (Signed)
 Physician Discharge Summary  Christine Singleton FMW:[id number] DOB: [birth year] DOA: 03/14/2024  PCP: Celestia Rosaline SQUIBB, NP  Admit date: 03/14/2024 Discharge date: 03/23/2024  Admitted From: Home Disposition: Home  Recommendations for Outpatient Follow-up:  Follow up with PCP in 1 week with repeat CBC/BMP Outpatient follow-up with pulmonary Follow up in ED if symptoms worsen or new appear   Home Health: No Equipment/Devices: Oxygen via nasal cannula at 2 L/min continuously.  Continue CPAP at night.  Discharge Condition: Stable CODE STATUS: Full Diet recommendation: Heart healthy  Brief/Interim Summary: 34 y.o. female with medical history significant of PE on Xarelto , hypertension, diabetes, morbid obesity, heart failure with mildly reduced ejection fraction, obstructive sleep apnea, cardiomyopathy presented with worsening shortness of breath.  She tested positive for influenza B.  Chest x-ray showed bilateral infiltrates.  She required BiPAP, along with increasing amount of supplemental oxygen.  She was also started on Rocephin  and Zithromax .  During the hospitalization, her condition has improved.  She has completed a course of antibiotics and Tamiflu .  She is requiring supplemental oxygen at 2 L/min during daytime as well which will be arranged for discharge.  She feels better today and feels okay to go home.  She will be discharged home today with outpatient follow-up with PCP/pulmonary and cardiology.  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  Currently hemodynamically stable - Has completed antibiotic treatment along with Tamiflu  treatment. - Required BiPAP initially.  Uses CPAP at night at home. She is requiring supplemental oxygen at 2 L/min during daytime as well which will be arranged  for discharge.  She feels better today and feels okay to go home.  She will be discharged home today with outpatient follow-up with PCP/pulmonary and cardiology.  Acute metabolic encephalopathy - Resolved  Cutibacterium Bacteremia MRSE bacteremia - Possibly contaminant.  No need for any more antibiotics  Chronic diastolic CHF - Resume Lasix  and spironolactone  on discharge.  Losartan  to remain on hold till reevaluation with PCP because of low blood pressures.  Outpatient follow-up with cardiology.  Continue Jardiance   Atypical chest pain - Troponins did not trend upwards. EKG nonischemic.  Resolved.  Outpatient follow-up  Obesity class III - Outpatient follow-up  OSA Continue CPAP at night  History of PE -Continue Xarelto .  CTA negative for PE  Hyponatremia - Mild.  Outpatient follow-up  Leukocytosis - Improving.  No labs today.  Outpatient follow-up   Discharge Instructions  Discharge Instructions     Ambulatory referral to Physical Therapy   Complete by: As directed    Increase activity slowly   Complete by: As directed       Allergies as of 03/23/2024       Reactions   Strawberry Extract Anaphylaxis        Medication List     STOP taking these medications    losartan  25 MG tablet Commonly known as: COZAAR        TAKE these medications    guaiFENesin  600 MG 12 hr tablet Commonly known as: MUCINEX  Take 2 tablets (1,200 mg total) by mouth 2 (two) times daily.   Jardiance  10 MG Tabs tablet Generic drug: empagliflozin  Take 1 tablet (10 mg total) by mouth daily.   metFORMIN  500 MG tablet Commonly known as: GLUCOPHAGE  Take 1 tablet (500 mg total) by mouth daily with breakfast.   OXYGEN Inhale 3 L/min into  the lungs at bedtime.   spironolactone  25 MG tablet Commonly known as: ALDACTONE  Take 1 tablet (25 mg total) by mouth daily.   torsemide  20 MG tablet Commonly known as: DEMADEX  Take 1 tablet (20 mg total) by mouth daily.   TYLENOL  500 MG  tablet Generic drug: acetaminophen  Take 1 tablet (500 mg total) by mouth every 6 (six) hours as needed (for headaches).   Xarelto  20 MG Tabs tablet Generic drug: rivaroxaban  Take 1 tablet (20 mg total) by mouth daily.               Durable Medical Equipment  (From admission, onward)           Start     Ordered   03/21/24 1046  DME Oxygen  Once       Question Answer Comment  Length of Need 6 Months   Mode or (Route) Nasal cannula   Liters per Minute 2   Frequency Continuous (stationary and portable oxygen unit needed)   Oxygen conserving device Yes   Oxygen delivery system: Gas   Oxygen delivery system: Portable concentrator (POC)      03/21/24 1045            Follow-up Information     [facility] Follow up.   Specialty: Rehabilitation Why: they will call you for appt if after 2 days you can call them. Contact information: [address]  [address] [place]  [address] [phone]        Celestia Rosaline SQUIBB, NP Follow up.   Specialty: Internal Medicine Contact information: [address] [place] [address] [phone]         [facility] (DME) Follow up.   Specialty: DME Services Why: oxygen Contact information: [address] [place]  [address] [phone]               Allergies[1]  Consultations: PCCM.  Procedures/Studies: CT ABDOMEN PELVIS W CONTRAST Result Date: 03/21/2024 EXAM: CT ABDOMEN AND PELVIS WITH CONTRAST 03/21/2024 06:54:33 PM TECHNIQUE: CT of the abdomen and pelvis was performed with the administration of 75 mL of iohexol  (OMNIPAQUE ) 350 MG/ML injection. Multiplanar reformatted images are provided for review. Automated exposure control, iterative reconstruction, and/or weight-based adjustment of the mA/kV was utilized to reduce the radiation dose to as low as reasonably achievable. COMPARISON: None  available. CLINICAL HISTORY: Abdominal pain, acute, nonlocalized. FINDINGS: LOWER CHEST: Subsegmental atelectasis in the left lower lobe. Posterior bibasilar dependent atelectasis. LIVER: The liver is unremarkable. GALLBLADDER AND BILE DUCTS: Gallbladder is unremarkable. No biliary ductal dilatation. SPLEEN: No acute abnormality. PANCREAS: No acute abnormality. ADRENAL GLANDS: No acute abnormality. KIDNEYS, URETERS AND BLADDER: No stones in the kidneys or ureters. No hydronephrosis. No perinephric or periureteral stranding. Urinary bladder is unremarkable. GI AND BOWEL: Stomach demonstrates no acute abnormality. There is no bowel obstruction. Hyperdense material throughout the colon, likely ingested material. Normal appendix. PERITONEUM AND RETROPERITONEUM: No ascites. No free air. No free pelvic fluid. VASCULATURE: Aorta is normal in caliber. LYMPH NODES: No lymphadenopathy. REPRODUCTIVE ORGANS: The uterus and ovaries are within normal limits for patients age. BONES AND SOFT TISSUES: No acute osseous abnormality. No focal soft tissue abnormality. IMPRESSION: 1. No acute findings in the abdomen or pelvis. Electronically signed by: Rogelia Myers MD 03/21/2024 09:35 PM EST RP Workstation: [id number]   DG Chest 1 View Result Date: 03/19/2024 EXAM: 1 VIEW XRAY OF THE CHEST 03/19/2024 11:01:00 AM COMPARISON: 03/14/2024 CLINICAL  HISTORY: SOB (shortness of breath) FINDINGS: LUNGS AND PLEURA: Mild interstitial prominence. Improved aeration. No focal pulmonary opacity. No pleural effusion. No pneumothorax. HEART AND MEDIASTINUM: Cardiomegaly, unchanged. BONES AND SOFT TISSUES: No acute osseous abnormality. IMPRESSION: 1. Mild interstitial prominence with improved aeration. 2. Unchanged cardiomegaly. Electronically signed by: Lonni Necessary MD 03/19/2024 05:34 PM EST RP Workstation: [id number]   CT Angio Chest PE W and/or Wo Contrast Result Date: 03/14/2024 EXAM: CTA CHEST 03/14/2024 12:21:12 PM TECHNIQUE: CTA of  the chest was performed without and with the administration of 100 mL of iohexol  (OMNIPAQUE ) 350 MG/ML injection. Multiplanar reformatted images are provided for review. MIP images are provided for review. Automated exposure control, iterative reconstruction, and/or weight based adjustment of the mA/kV was utilized to reduce the radiation dose to as low as reasonably achievable. COMPARISON: None available. CLINICAL HISTORY: Pulmonary embolism (PE) suspected, high prob. FINDINGS: PULMONARY ARTERIES: Pulmonary arteries are adequately opacified for evaluation. No acute pulmonary embolus. Main pulmonary artery is normal in caliber. MEDIASTINUM: Cardiomegaly. There is no acute abnormality of the thoracic aorta. LYMPH NODES: No mediastinal, hilar or axillary lymphadenopathy. LUNGS AND PLEURA: Consolidation in the left upper lobe. Patchy airspace disease in the right middle lobe and both lower lobes. Findings concerning for multifocal pneumonia. No evidence of pleural effusion or pneumothorax. UPPER ABDOMEN: Limited images of the upper abdomen are unremarkable. SOFT TISSUES AND BONES: No acute bone or soft tissue abnormality. IMPRESSION: 1. No pulmonary embolism. 2. Patchy bilateral airspace disease most concerning for multifocal pneumonia. 3. Cardiomegaly. Electronically signed by: Franky Crease MD 03/14/2024 01:56 PM EST RP Workstation: [id number]   DG Chest Portable 1 View Result Date: 03/14/2024 EXAM: 1 VIEW(S) XRAY OF THE CHEST 03/14/2024 09:08:00 AM COMPARISON: 01/02/2024 CLINICAL HISTORY: sob FINDINGS: LUNGS AND PLEURA: Diffuse prominence of the parahilar interstitial markings, most prominent in the right lower lobe, favor mild pulmonary edema. No pleural effusion. No pneumothorax. HEART AND MEDIASTINUM: Cardiomegaly. BONES AND SOFT TISSUES: No acute osseous abnormality. IMPRESSION: 1. Cardiomegaly. 2. Diffuse prominence of the parahilar interstitial markings, most prominent in the right lower lobe, favoring mild  pulmonary edema. Electronically signed by: Selinda Blue MD 03/14/2024 10:38 AM EST RP Workstation: [id number]      Subjective: Patient seen and examined at bedside.  Feels okay to go home today.  No fever, vomiting, worsening abdominal pain reported.  Discharge Exam: Vitals:   03/23/24 0605 03/23/24 0906  BP: 124/66   Pulse: 95   Resp: 18   Temp: 98.5 F (36.9 C)   SpO2: 96% 94%    General: Pt is alert, awake, not in acute distress.  Chronically ill and deconditioned looking.  Currently on 2 L oxygen via nasal cannula. Cardiovascular: rate controlled, S1/S2 + Respiratory: bilateral decreased breath sounds at bases with scattered crackles Abdominal: Soft, morbidly obese, NT, ND, bowel sounds + Extremities: Trace lower extremity edema; no cyanosis    The results of significant diagnostics from this hospitalization (including imaging, microbiology, ancillary and laboratory) are listed below for reference.     Microbiology: Recent Results (from the past 240 hours)  MRSA Next Gen by PCR, Nasal     Status: None   Collection Time: 03/14/24 12:11 AM   Specimen: Nasal Mucosa; Nasal Swab  Result Value Ref Range Status   MRSA by PCR Next Gen NOT DETECTED NOT DETECTED Final    Comment: (NOTE) The GeneXpert MRSA Assay (FDA approved for NASAL specimens only), is one component of a comprehensive MRSA colonization surveillance program. It is not intended  to diagnose MRSA infection nor to guide or monitor treatment for MRSA infections. Test performance is not FDA approved in patients less than 24 years old. Performed at [facility], [address] [place], [place] [address]   Resp panel by RT-PCR (RSV, Flu A&B, Covid) Anterior Nasal Swab     Status: Abnormal   Collection Time: 03/14/24  8:52 AM   Specimen: Anterior Nasal Swab  Result Value Ref Range Status   SARS Coronavirus 2 by RT PCR NEGATIVE NEGATIVE Final    Comment: (NOTE) SARS-CoV-2 target nucleic acids  are NOT DETECTED.  The SARS-CoV-2 RNA is generally detectable in upper respiratory specimens during the acute phase of infection. The lowest concentration of SARS-CoV-2 viral copies this assay can detect is 138 copies/mL. A negative result does not preclude SARS-Cov-2 infection and should not be used as the sole basis for treatment or other patient management decisions. A negative result may occur with  improper specimen collection/handling, submission of specimen other than nasopharyngeal swab, presence of viral mutation(s) within the areas targeted by this assay, and inadequate number of viral copies(<138 copies/mL). A negative result must be combined with clinical observations, patient history, and epidemiological information. The expected result is Negative.  Fact Sheet for Patients:  bloggercourse.com  Fact Sheet for Healthcare Providers:  seriousbroker.it  This test is no t yet approved or cleared by the [place]  FDA and  has been authorized for detection and/or diagnosis of SARS-CoV-2 by FDA under an Emergency Use Authorization (EUA). This EUA will remain  in effect (meaning this test can be used) for the duration of the COVID-19 declaration under Section 564(b)(1) of the Act, 21 U.S.C.section 360bbb-3(b)(1), unless the authorization is terminated  or revoked sooner.       Influenza A by PCR NEGATIVE NEGATIVE Final   Influenza B by PCR POSITIVE (A) NEGATIVE Final    Comment: (NOTE) The Xpert Xpress SARS-CoV-2/FLU/RSV plus assay is intended as an aid in the diagnosis of influenza from Nasopharyngeal swab specimens and should not be used as a sole basis for treatment. Nasal washings and aspirates are unacceptable for Xpert Xpress SARS-CoV-2/FLU/RSV testing.  Fact Sheet for Patients: bloggercourse.com  Fact Sheet for Healthcare Providers: seriousbroker.it  This test  is not yet approved or cleared by the [place]  FDA and has been authorized for detection and/or diagnosis of SARS-CoV-2 by FDA under an Emergency Use Authorization (EUA). This EUA will remain in effect (meaning this test can be used) for the duration of the COVID-19 declaration under Section 564(b)(1) of the Act, 21 U.S.C. section 360bbb-3(b)(1), unless the authorization is terminated or revoked.     Resp Syncytial Virus by PCR NEGATIVE NEGATIVE Final    Comment: (NOTE) Fact Sheet for Patients: bloggercourse.com  Fact Sheet for Healthcare Providers: seriousbroker.it  This test is not yet approved or cleared by the [place]  FDA and has been authorized for detection and/or diagnosis of SARS-CoV-2 by FDA under an Emergency Use Authorization (EUA). This EUA will remain in effect (meaning this test can be used) for the duration of the COVID-19 declaration under Section 564(b)(1) of the Act, 21 U.S.C. section 360bbb-3(b)(1), unless the authorization is terminated or revoked.  Performed at [facility], [address] [place], [place] [address]   Blood culture (routine x 2)     Status: Abnormal   Collection Time: 03/14/24 10:15 AM   Specimen: BLOOD  Result Value Ref Range Status   Specimen Description   Final  BLOOD RIGHT ANTECUBITAL Performed at [facility], [address] [place], [place] [address]    Special Requests   Final    BOTTLES DRAWN AEROBIC AND ANAEROBIC Blood Culture results may not be optimal due to an inadequate volume of blood received in culture bottles Performed at [facility], [address] [place], [place] [address]    Culture  Setup Time   Final    GRAM POSITIVE RODS ANAEROBIC BOTTLE ONLY CRITICAL RESULT CALLED TO, READ BACK BY AND VERIFIED WITH: PHARMD D. [id number] @ 2058 FH    Culture (A)  Final    CUTIBACTERIUM ACNES Standardized susceptibility  testing for this organism is not available. Performed at [facility], [address], [place], [place] [address]    Report Status 03/20/2024 FINAL  Final  Blood culture (routine x 2)     Status: Abnormal   Collection Time: 03/14/24 10:20 AM   Specimen: BLOOD  Result Value Ref Range Status   Specimen Description   Final    BLOOD BLOOD LEFT HAND Performed at [facility], [address] [place], [place] [address]    Special Requests   Final    BOTTLES DRAWN AEROBIC AND ANAEROBIC Blood Culture adequate volume Performed at [facility], [address] [place], [place] [address]    Culture  Setup Time   Final    GRAM POSITIVE RODS ANAEROBIC BOTTLE ONLY CRITICAL RESULT CALLED TO, READ BACK BY AND VERIFIED WITH:  MITCHELL LILLISTONPHARMD 03/19/2024 BY DD @ [phone]    Culture (A)  Final    CUTIBACTERIUM ACNES Standardized susceptibility testing for this organism is not available. Performed at [facility], [address] [place], [place] [address]    Report Status 03/21/2024 FINAL  Final  Culture, blood (Routine X 2) w Reflex to ID Panel     Status: None (Preliminary result)   Collection Time: 03/21/24  2:39 PM   Specimen: BLOOD RIGHT ARM  Result Value Ref Range Status   Specimen Description   Final    BLOOD RIGHT ARM Performed at [facility], [address] [place], [place] [address]    Special Requests   Final    BOTTLES DRAWN AEROBIC ONLY Blood Culture results may not be optimal due to an inadequate volume of blood received in culture bottles Performed at [facility], [address], [place], [place] [address]    Culture  Setup Time   Final    GRAM POSITIVE COCCI AEROBIC BOTTLE ONLY CRITICAL RESULT CALLED TO, READ BACK BY AND VERIFIED WITH: PHARMD Crystral R on [id number] @2010  by SM Performed at [facility], [address] [place], [place] [address]    Culture GRAM POSITIVE COCCI  Final   Report Status PENDING   Incomplete  Blood Culture ID Panel (Reflexed)     Status: Abnormal   Collection Time: 03/21/24  2:39 PM  Result Value Ref Range Status   Enterococcus faecalis NOT DETECTED NOT DETECTED Final   Enterococcus Faecium NOT DETECTED NOT DETECTED Final   Listeria monocytogenes NOT DETECTED NOT DETECTED Final   Staphylococcus species DETECTED (A) NOT DETECTED Final    Comment: CRITICAL RESULT CALLED TO, READ BACK BY AND VERIFIED WITH: PHARMD Crystral R on [id number] @2010  by SM    Staphylococcus aureus (BCID) NOT DETECTED NOT DETECTED Final   Staphylococcus epidermidis DETECTED (A) NOT DETECTED Final  Comment: Methicillin (oxacillin) resistant coagulase negative staphylococcus. Possible blood culture contaminant (unless isolated from more than one blood culture draw or clinical case suggests pathogenicity). No antibiotic treatment is indicated for blood  culture contaminants. CRITICAL RESULT CALLED TO, READ BACK BY AND VERIFIED WITH: PHARMD Crystral R on [id number] @2010  by SM    Staphylococcus lugdunensis NOT DETECTED NOT DETECTED Final   Streptococcus species NOT DETECTED NOT DETECTED Final   Streptococcus agalactiae NOT DETECTED NOT DETECTED Final   Streptococcus pneumoniae NOT DETECTED NOT DETECTED Final   Streptococcus pyogenes NOT DETECTED NOT DETECTED Final   A.calcoaceticus-baumannii NOT DETECTED NOT DETECTED Final   Bacteroides fragilis NOT DETECTED NOT DETECTED Final   Enterobacterales NOT DETECTED NOT DETECTED Final   Enterobacter cloacae complex NOT DETECTED NOT DETECTED Final   Escherichia coli NOT DETECTED NOT DETECTED Final   Klebsiella aerogenes NOT DETECTED NOT DETECTED Final   Klebsiella oxytoca NOT DETECTED NOT DETECTED Final   Klebsiella pneumoniae NOT DETECTED NOT DETECTED Final   Proteus species NOT DETECTED NOT DETECTED Final   Salmonella species NOT DETECTED NOT DETECTED Final   Serratia marcescens NOT DETECTED NOT DETECTED Final   Haemophilus influenzae NOT DETECTED NOT  DETECTED Final   Neisseria meningitidis NOT DETECTED NOT DETECTED Final   Pseudomonas aeruginosa NOT DETECTED NOT DETECTED Final   Stenotrophomonas maltophilia NOT DETECTED NOT DETECTED Final   Candida albicans NOT DETECTED NOT DETECTED Final   Candida auris NOT DETECTED NOT DETECTED Final   Candida glabrata NOT DETECTED NOT DETECTED Final   Candida krusei NOT DETECTED NOT DETECTED Final   Candida parapsilosis NOT DETECTED NOT DETECTED Final   Candida tropicalis NOT DETECTED NOT DETECTED Final   Cryptococcus neoformans/gattii NOT DETECTED NOT DETECTED Final   Methicillin resistance mecA/C DETECTED (A) NOT DETECTED Final    Comment: CRITICAL RESULT CALLED TO, READ BACK BY AND VERIFIED WITH: PHARMD Crystral R on [id number] @2010  by SM Performed at [facility], [address] [place], [place] [address]   Culture, blood (Routine X 2) w Reflex to ID Panel     Status: None (Preliminary result)   Collection Time: 03/21/24  2:44 PM   Specimen: BLOOD RIGHT HAND  Result Value Ref Range Status   Specimen Description   Final    BLOOD RIGHT HAND Performed at [facility], [address] [place], [place] [address]    Special Requests   Final    BOTTLES DRAWN AEROBIC ONLY Blood Culture results may not be optimal due to an inadequate volume of blood received in culture bottles Performed at [facility], [address] [place], [place] [address]    Culture   Final    NO GROWTH 2 DAYS Performed at [facility], [address], [place], [place] [address]    Report Status PENDING  Incomplete  Culture, blood (Routine X 2) w Reflex to ID Panel     Status: None (Preliminary result)   Collection Time: 03/23/24 12:49 AM   Specimen: BLOOD LEFT HAND  Result Value Ref Range Status   Specimen Description   Final    BLOOD LEFT HAND Performed at [facility], [address] [place], [place] [address]    Special Requests   Final    BOTTLES DRAWN AEROBIC AND  ANAEROBIC Blood Culture results may not be optimal due to an inadequate volume of blood received in culture bottles Performed at [facility], [address] [place], [place]  [address]    Culture PENDING  Incomplete   Report Status PENDING  Incomplete  Culture, blood (Routine X 2) w Reflex to ID Panel     Status: None (Preliminary result)   Collection Time: 03/23/24 12:49 AM   Specimen: BLOOD LEFT ARM  Result Value Ref Range Status   Specimen Description   Final    BLOOD LEFT ARM Performed at [facility], [address], [place], [place] [address]    Special Requests   Final    BOTTLES DRAWN AEROBIC AND ANAEROBIC Blood Culture results may not be optimal due to an inadequate volume of blood received in culture bottles Performed at [facility], [address], [place], [place] [address]    Culture PENDING  Incomplete   Report Status PENDING  Incomplete     Labs: BNP (last 3 results) Recent Labs    07/06/23 2209 12/31/23 1627 02/16/24 1044  BNP 78.8 112.8* 70.9   Basic Metabolic Panel: Recent Labs  Lab 03/17/24 0317 03/18/24 0226 03/20/24 1240 03/21/24 1439 03/22/24 1107  NA 134* 135 136 136 133*  K 4.7 4.4 3.9 3.6 3.6  CL 89* 88* 89* 88* 89*  CO2 40* 40* 38* 37* 33*  GLUCOSE 92 72 158* 119* 194*  BUN 11 16 16 14 12   CREATININE 0.68 0.59 0.65 0.70 0.69  CALCIUM 10.1 9.6 9.7 9.9 9.5  MG 2.5*  --   --   --   --    Liver Function Tests: Recent Labs  Lab 03/18/24 0226 03/20/24 1240 03/21/24 1439 03/22/24 1107  AST 26 31 31 24   ALT 11 22 24 21   ALKPHOS 121 137* 136* 115  BILITOT 0.4 0.7 0.9 0.9  PROT 7.6 8.4* 8.5* 7.3  ALBUMIN 3.5 4.0 4.1 3.7   No results for input(s): LIPASE, AMYLASE in the last 168 hours. No results for input(s): AMMONIA in the last 168 hours. CBC: Recent Labs  Lab 03/17/24 0317 03/18/24 0226 03/20/24 1240 03/21/24 1439 03/22/24 1107  WBC 6.4 8.6 15.4* 15.3* 12.7*  NEUTROABS 4.1   --   --   --   --   HGB 13.7 14.0 16.1* 15.7* 15.1*  HCT 45.8 45.8 51.1* 51.1* 47.6*  MCV 72.7* 71.9* 70.1* 71.0* 69.1*  PLT 330 325 384 323 335   Cardiac Enzymes: No results for input(s): CKTOTAL, CKMB, CKMBINDEX, TROPONINI in the last 168 hours. BNP: Invalid input(s): POCBNP CBG: Recent Labs  Lab 03/22/24 0743 03/22/24 1106 03/22/24 1607 03/22/24 2016 03/23/24 0734  GLUCAP 135* 199* 99 146* 152*   D-Dimer No results for input(s): DDIMER in the last 72 hours. Hgb A1c No results for input(s): HGBA1C in the last 72 hours. Lipid Profile No results for input(s): CHOL, HDL, LDLCALC, TRIG, CHOLHDL, LDLDIRECT in the last 72 hours. Thyroid function studies No results for input(s): TSH, T4TOTAL, T3FREE, THYROIDAB in the last 72 hours.  Invalid input(s): FREET3 Anemia work up No results for input(s): VITAMINB12, FOLATE, FERRITIN, TIBC, IRON, RETICCTPCT in the last 72 hours. Urinalysis    Component Value Date/Time   COLORURINE YELLOW 03/14/2024 1016   APPEARANCEUR CLEAR 03/14/2024 1016   LABSPEC 1.015 03/14/2024 1016   PHURINE 7.0 03/14/2024 1016   GLUCOSEU 100 (A) 03/14/2024 1016   HGBUR TRACE (A) 03/14/2024 1016   BILIRUBINUR NEGATIVE 03/14/2024 1016   KETONESUR NEGATIVE 03/14/2024 1016   PROTEINUR NEGATIVE 03/14/2024 1016   UROBILINOGEN 1.0 11/27/2015 1450   NITRITE NEGATIVE 03/14/2024 1016   LEUKOCYTESUR NEGATIVE 03/14/2024  1016   Sepsis Labs Recent Labs  Lab 03/18/24 0226 03/20/24 1240 03/21/24 1439 03/22/24 1107  WBC 8.6 15.4* 15.3* 12.7*   Microbiology Recent Results (from the past 240 hours)  MRSA Next Gen by PCR, Nasal     Status: None   Collection Time: 03/14/24 12:11 AM   Specimen: Nasal Mucosa; Nasal Swab  Result Value Ref Range Status   MRSA by PCR Next Gen NOT DETECTED NOT DETECTED Final    Comment: (NOTE) The GeneXpert MRSA Assay (FDA approved for NASAL specimens only), is one component of a  comprehensive MRSA colonization surveillance program. It is not intended to diagnose MRSA infection nor to guide or monitor treatment for MRSA infections. Test performance is not FDA approved in patients less than 40 years old. Performed at [facility], [address], [place], [place] [address]   Resp panel by RT-PCR (RSV, Flu A&B, Covid) Anterior Nasal Swab     Status: Abnormal   Collection Time: 03/14/24  8:52 AM   Specimen: Anterior Nasal Swab  Result Value Ref Range Status   SARS Coronavirus 2 by RT PCR NEGATIVE NEGATIVE Final    Comment: (NOTE) SARS-CoV-2 target nucleic acids are NOT DETECTED.  The SARS-CoV-2 RNA is generally detectable in upper respiratory specimens during the acute phase of infection. The lowest concentration of SARS-CoV-2 viral copies this assay can detect is 138 copies/mL. A negative result does not preclude SARS-Cov-2 infection and should not be used as the sole basis for treatment or other patient management decisions. A negative result may occur with  improper specimen collection/handling, submission of specimen other than nasopharyngeal swab, presence of viral mutation(s) within the areas targeted by this assay, and inadequate number of viral copies(<138 copies/mL). A negative result must be combined with clinical observations, patient history, and epidemiological information. The expected result is Negative.  Fact Sheet for Patients:  bloggercourse.com  Fact Sheet for Healthcare Providers:  seriousbroker.it  This test is no t yet approved or cleared by the [place]  FDA and  has been authorized for detection and/or diagnosis of SARS-CoV-2 by FDA under an Emergency Use Authorization (EUA). This EUA will remain  in effect (meaning this test can be used) for the duration of the COVID-19 declaration under Section 564(b)(1) of the Act, 21 U.S.C.section 360bbb-3(b)(1), unless the  authorization is terminated  or revoked sooner.       Influenza A by PCR NEGATIVE NEGATIVE Final   Influenza B by PCR POSITIVE (A) NEGATIVE Final    Comment: (NOTE) The Xpert Xpress SARS-CoV-2/FLU/RSV plus assay is intended as an aid in the diagnosis of influenza from Nasopharyngeal swab specimens and should not be used as a sole basis for treatment. Nasal washings and aspirates are unacceptable for Xpert Xpress SARS-CoV-2/FLU/RSV testing.  Fact Sheet for Patients: bloggercourse.com  Fact Sheet for Healthcare Providers: seriousbroker.it  This test is not yet approved or cleared by the [place]  FDA and has been authorized for detection and/or diagnosis of SARS-CoV-2 by FDA under an Emergency Use Authorization (EUA). This EUA will remain in effect (meaning this test can be used) for the duration of the COVID-19 declaration under Section 564(b)(1) of the Act, 21 U.S.C. section 360bbb-3(b)(1), unless the authorization is terminated or revoked.     Resp Syncytial Virus by PCR NEGATIVE NEGATIVE Final    Comment: (NOTE) Fact Sheet for Patients: bloggercourse.com  Fact Sheet for Healthcare Providers: seriousbroker.it  This test is not yet approved or cleared by the [place]  FDA  and has been authorized for detection and/or diagnosis of SARS-CoV-2 by FDA under an Emergency Use Authorization (EUA). This EUA will remain in effect (meaning this test can be used) for the duration of the COVID-19 declaration under Section 564(b)(1) of the Act, 21 U.S.C. section 360bbb-3(b)(1), unless the authorization is terminated or revoked.  Performed at [facility] [address] [place], [place] [address]   Blood culture (routine x 2)     Status: Abnormal   Collection Time: 03/14/24 10:15 AM   Specimen: BLOOD  Result Value Ref Range Status   Specimen Description   Final     BLOOD RIGHT ANTECUBITAL Performed at [facility], [address] [place], [place] [address]    Special Requests   Final    BOTTLES DRAWN AEROBIC AND ANAEROBIC Blood Culture results may not be optimal due to an inadequate volume of blood received in culture bottles Performed at [facility], [address] [place], [place] [address]    Culture  Setup Time   Final    GRAM POSITIVE RODS ANAEROBIC BOTTLE ONLY CRITICAL RESULT CALLED TO, READ BACK BY AND VERIFIED WITH: PHARMD D. [id number] @ 2058 FH    Culture (A)  Final    CUTIBACTERIUM ACNES Standardized susceptibility testing for this organism is not available. Performed at [facility], [address] [place], [place] [address]    Report Status 03/20/2024 FINAL  Final  Blood culture (routine x 2)     Status: Abnormal   Collection Time: 03/14/24 10:20 AM   Specimen: BLOOD  Result Value Ref Range Status   Specimen Description   Final    BLOOD BLOOD LEFT HAND Performed at [facility], [address] [place], [place] [address]    Special Requests   Final    BOTTLES DRAWN AEROBIC AND ANAEROBIC Blood Culture adequate volume Performed at [facility], [address] [place], [place] [address]    Culture  Setup Time   Final    GRAM POSITIVE RODS ANAEROBIC BOTTLE ONLY CRITICAL RESULT CALLED TO, READ BACK BY AND VERIFIED WITH:  MITCHELL LILLISTONPHARMD 03/19/2024 BY DD @ [phone]    Culture (A)  Final    CUTIBACTERIUM ACNES Standardized susceptibility testing for this organism is not available. Performed at [facility], [address], [place], [place] [address]    Report Status 03/21/2024 FINAL  Final  Culture, blood (Routine X 2) w Reflex to ID Panel     Status: None (Preliminary result)   Collection Time: 03/21/24  2:39 PM   Specimen: BLOOD RIGHT ARM  Result Value Ref Range Status   Specimen Description   Final    BLOOD RIGHT ARM Performed at [facility], [address] [place], [place] [address]    Special Requests   Final    BOTTLES DRAWN AEROBIC ONLY Blood Culture results may not be optimal due to an inadequate volume of blood received in culture bottles Performed at [facility], [address] [place], [place] [address]    Culture  Setup Time   Final    GRAM POSITIVE COCCI AEROBIC BOTTLE ONLY CRITICAL RESULT CALLED TO, READ BACK BY AND VERIFIED WITH: PHARMD Crystral R on [id number] @2010  by SM Performed at [facility], [address] [place], [place] [address]    Culture [facility] POSITIVE COCCI  Final   Report Status  PENDING  Incomplete  Blood Culture ID Panel (Reflexed)     Status: Abnormal   Collection Time: 03/21/24  2:39 PM  Result Value Ref Range Status   Enterococcus faecalis NOT DETECTED NOT DETECTED Final   Enterococcus Faecium NOT DETECTED NOT DETECTED Final   Listeria monocytogenes NOT DETECTED NOT DETECTED Final   Staphylococcus species DETECTED (A) NOT DETECTED Final    Comment: CRITICAL RESULT CALLED TO, READ BACK BY AND VERIFIED WITH: PHARMD Crystral R on [id number] @2010  by SM    Staphylococcus aureus (BCID) NOT DETECTED NOT DETECTED Final   Staphylococcus epidermidis DETECTED (A) NOT DETECTED Final    Comment: Methicillin (oxacillin) resistant coagulase negative staphylococcus. Possible blood culture contaminant (unless isolated from more than one blood culture draw or clinical case suggests pathogenicity). No antibiotic treatment is indicated for blood  culture contaminants. CRITICAL RESULT CALLED TO, READ BACK BY AND VERIFIED WITH: PHARMD Crystral R on [id number] @2010  by SM    Staphylococcus lugdunensis NOT DETECTED NOT DETECTED Final   Streptococcus species NOT DETECTED NOT DETECTED Final   Streptococcus agalactiae NOT DETECTED NOT DETECTED Final   Streptococcus pneumoniae NOT DETECTED NOT DETECTED Final   Streptococcus pyogenes NOT DETECTED NOT DETECTED Final   A.calcoaceticus-baumannii NOT  DETECTED NOT DETECTED Final   Bacteroides fragilis NOT DETECTED NOT DETECTED Final   Enterobacterales NOT DETECTED NOT DETECTED Final   Enterobacter cloacae complex NOT DETECTED NOT DETECTED Final   Escherichia coli NOT DETECTED NOT DETECTED Final   Klebsiella aerogenes NOT DETECTED NOT DETECTED Final   Klebsiella oxytoca NOT DETECTED NOT DETECTED Final   Klebsiella pneumoniae NOT DETECTED NOT DETECTED Final   Proteus species NOT DETECTED NOT DETECTED Final   Salmonella species NOT DETECTED NOT DETECTED Final   Serratia marcescens NOT DETECTED NOT DETECTED Final   Haemophilus influenzae NOT DETECTED NOT DETECTED Final   Neisseria meningitidis NOT DETECTED NOT DETECTED Final   Pseudomonas aeruginosa NOT DETECTED NOT DETECTED Final   Stenotrophomonas maltophilia NOT DETECTED NOT DETECTED Final   Candida albicans NOT DETECTED NOT DETECTED Final   Candida auris NOT DETECTED NOT DETECTED Final   Candida glabrata NOT DETECTED NOT DETECTED Final   Candida krusei NOT DETECTED NOT DETECTED Final   Candida parapsilosis NOT DETECTED NOT DETECTED Final   Candida tropicalis NOT DETECTED NOT DETECTED Final   Cryptococcus neoformans/gattii NOT DETECTED NOT DETECTED Final   Methicillin resistance mecA/C DETECTED (A) NOT DETECTED Final    Comment: CRITICAL RESULT CALLED TO, READ BACK BY AND VERIFIED WITH: PHARMD Crystral R on [id number] @2010  by SM Performed at [facility], [address] [place], [place] [address]   Culture, blood (Routine X 2) w Reflex to ID Panel     Status: None (Preliminary result)   Collection Time: 03/21/24  2:44 PM   Specimen: BLOOD RIGHT HAND  Result Value Ref Range Status   Specimen Description   Final    BLOOD RIGHT HAND Performed at [facility], [address] [place], [place] [address]    Special Requests   Final    BOTTLES DRAWN AEROBIC ONLY Blood Culture results may not be optimal due to an inadequate volume of blood received in culture  bottles Performed at [facility], [address] [place], [place] [address]    Culture   Final    NO GROWTH 2 DAYS Performed at [facility], [address], [place], [place] [address]    Report Status PENDING  Incomplete  Culture, blood (Routine X 2) w Reflex to ID Panel     Status: None (Preliminary result)   Collection Time: 03/23/24 12:49 AM   Specimen: BLOOD LEFT HAND  Result Value Ref Range Status   Specimen Description   Final    BLOOD LEFT HAND Performed at [facility], [address] [place], [place] [address]    Special Requests   Final    BOTTLES DRAWN AEROBIC AND ANAEROBIC Blood Culture results may not be optimal due to an inadequate volume of blood received in culture bottles Performed at [facility], [address] [place], [place] [address]    Culture PENDING  Incomplete   Report Status PENDING  Incomplete  Culture, blood (Routine X 2) w Reflex to ID Panel     Status: None (Preliminary result)   Collection Time: 03/23/24 12:49 AM   Specimen: BLOOD LEFT ARM  Result Value Ref Range Status   Specimen Description   Final    BLOOD LEFT ARM Performed at [facility], [address] [place], [place] [address]    Special Requests   Final    BOTTLES DRAWN AEROBIC AND ANAEROBIC Blood Culture results may not be optimal due to an inadequate volume of blood received in culture bottles Performed at [facility], [address], [place], [place] [address]    Culture PENDING  Incomplete   Report Status PENDING  Incomplete     Time coordinating discharge: 35 minutes  SIGNED:   Sophie Mao, MD  [facility] 03/23/2024, 10:45 AM      [1]  Allergies Allergen Reactions   Strawberry Extract Anaphylaxis

## 2024-03-23 NOTE — TOC CM/SW Note (Deleted)
 Current Facility-Administered Medications  Medication Dose Route Frequency Provider Last Rate Last Admin   acetaminophen  (TYLENOL ) tablet 650 mg  650 mg Oral Q6H PRN Rojelio Nest, DO   650 mg at 03/19/24 1732   Or   acetaminophen  (TYLENOL ) suppository 650 mg  650 mg Rectal Q6H PRN Rojelio Nest, DO       arformoterol  (BROVANA ) nebulizer solution 15 mcg  15 mcg Nebulization BID Dewald, Jonathan B, MD   15 mcg at 03/23/24 9092   Chlorhexidine  Gluconate Cloth 2 % PADS 6 each  6 each Topical Q2200 Will Almarie MATSU, MD   6 each at 03/20/24 2132   empagliflozin  (JARDIANCE ) tablet 10 mg  10 mg Oral Daily Rojelio Nest, DO   10 mg at 03/23/24 9140   Gerhardt's butt cream   Topical BID Chavez, Abigail, NP   Given at 03/21/24 9075   guaiFENesin  (MUCINEX ) 12 hr tablet 1,200 mg  1,200 mg Oral BID Sreeram, Narendranath, MD   1,200 mg at 03/23/24 0858   guaiFENesin -dextromethorphan  (ROBITUSSIN DM) 100-10 MG/5ML syrup 5 mL  5 mL Oral Q4H PRN Will Almarie MATSU, MD   5 mL at 03/19/24 2214   HYDROmorphone  (DILAUDID ) injection 0.5 mg  0.5 mg Intravenous Q4H PRN Will Almarie MATSU, MD       insulin  aspart (novoLOG ) injection 0-15 Units  0-15 Units Subcutaneous TID WC Rojelio Nest, DO   3 Units at 03/23/24 0858   ipratropium-albuterol  (DUONEB) 0.5-2.5 (3) MG/3ML nebulizer solution 3 mL  3 mL Nebulization Q4H PRN Celinda Alm Lot, MD   3 mL at 03/20/24 2026   ondansetron  (ZOFRAN ) tablet 4 mg  4 mg Oral Q6H PRN Rojelio Nest, DO       Or   ondansetron  (ZOFRAN ) injection 4 mg  4 mg Intravenous Q6H PRN Rojelio Nest, DO   4 mg at 03/15/24 1110   Oral care mouth rinse  15 mL Mouth Rinse 4 times per day Rojelio Nest, DO   15 mL at 03/22/24 2244   Oral care mouth rinse  15 mL Mouth Rinse PRN Rojelio Nest, DO       oxyCODONE  (Oxy IR/ROXICODONE ) immediate release tablet 5 mg  5 mg Oral Q6H PRN Sreeram, Narendranath, MD   5 mg at 03/21/24 9076   polyethylene glycol (MIRALAX  / GLYCOLAX ) packet 17 g  17 g  Oral Daily PRN Rojelio Nest, DO       promethazine  (PHENERGAN ) 12.5 mg in sodium chloride  0.9 % 50 mL IVPB  12.5 mg Intravenous Q6H PRN Darci Pore, MD   Stopped at 03/15/24 1337   revefenacin  (YUPELRI ) nebulizer solution 175 mcg  175 mcg Nebulization Daily Kara Dorn NOVAK, MD   175 mcg at 03/23/24 9093   rivaroxaban  (XARELTO ) tablet 20 mg  20 mg Oral QPC supper Rojelio Nest, DO   20 mg at 03/22/24 1848   sodium chloride  (OCEAN) 0.65 % nasal spray 1 spray  1 spray Each Nare PRN Will Almarie MATSU, MD       sodium chloride  flush (NS) 0.9 % injection 10-40 mL  10-40 mL Intracatheter Q12H Will Almarie MATSU, MD   10 mL at 03/22/24 9075   sodium chloride  flush (NS) 0.9 % injection 10-40 mL  10-40 mL Intracatheter PRN Will Almarie MATSU, MD       sodium chloride  flush (NS) 0.9 % injection 3 mL  3 mL Intravenous Q12H Rojelio Nest, DO   3 mL at 03/23/24 1016   sodium chloride  flush (NS) 0.9 % injection  3 mL  3 mL Intravenous Q12H Rojelio Nest, DO   3 mL at 03/23/24 1016   sodium chloride  flush (NS) 0.9 % injection 3 mL  3 mL Intravenous PRN Rojelio Nest, DO          Durable Medical Equipment  (From admission, onward)           Start     Ordered   03/21/24 1046  DME Oxygen  Once       Question Answer Comment  Length of Need 6 Months   Mode or (Route) Nasal cannula   Liters per Minute 2   Frequency Continuous (stationary and portable oxygen unit needed)   Oxygen conserving device Yes   Oxygen delivery system: Gas   Oxygen delivery system: Portable concentrator (POC)      03/21/24 1045

## 2024-03-23 NOTE — Consult Note (Incomplete)
 "                                                                  [facility]                                                                                        Patient Identification: Patient Name: Christine Singleton MRN: [id number] Admit Date: 03/14/2024  8:53 AM Today's Date: 03/23/2024 Reason for consult:  Requesting provider:   Principal Problem:   Acute respiratory failure with hypoxia and hypercapnia (HCC) Active Problems:   Non-insulin  dependent type 2 diabetes mellitus (HCC)   Class 3 obesity (HCC)   Essential hypertension   OSA (obstructive sleep apnea)   Acute on chronic diastolic CHF (congestive heart failure) (HCC)   History of pulmonary embolism   Influenza A with pneumonia   Multifocal pneumonia   Morbid obesity with BMI of 60.0-69.9, adult (HCC)   Influenza B   Sleep apnea   Acute metabolic encephalopathy   Antibiotics:   Lines/Hardware:  Assessment   Recommendations      Rest of the management as per the primary team. Please call with questions or concerns.  Thank you for the consult  __________________________________________________________________________________________________________ HPI and Hospital Course:    ROS: General- Denies fever, chills, loss of appetite and loss of weight HEENT - Denies headache, blurry vision, neck pain, sinus pain Chest - Denies any chest pain, SOB or cough CVS- Denies any dizziness/lightheadedness, syncopal attacks, palpitations Abdomen- Denies any nausea, vomiting, abdominal pain, hematochezia and diarrhea Neuro - Denies any weakness, numbness, tingling sensation Psych - Denies any changes in mood irritability or depressive symptoms GU- Denies any burning, dysuria, hematuria or increased frequency of urination Skin - denies any rashes/lesions MSK - denies any joint pain/swelling or restricted ROM    PMHx: PSHx:  Scheduled Meds:  arformoterol   15 mcg Nebulization BID    Chlorhexidine  Gluconate Cloth  6 each Topical Q2200   empagliflozin   10 mg Oral Daily   Gerhardt's butt cream   Topical BID   guaiFENesin   1,200 mg Oral BID   insulin  aspart  0-15 Units Subcutaneous TID WC   mouth rinse  15 mL Mouth Rinse 4 times per day   revefenacin   175 mcg Nebulization Daily   rivaroxaban   20 mg Oral QPC supper   sodium chloride  flush  10-40 mL Intracatheter Q12H   sodium chloride  flush  3 mL Intravenous Q12H   sodium chloride  flush  3 mL Intravenous Q12H   Continuous Infusions:  promethazine  (PHENERGAN ) injection (IM or IVPB) Stopped (03/15/24 1337)   PRN Meds:.acetaminophen  **OR** acetaminophen , guaiFENesin -dextromethorphan , HYDROmorphone  (DILAUDID ) injection, ipratropium-albuterol , ondansetron  **OR** ondansetron  (ZOFRAN ) IV, mouth rinse, oxyCODONE , polyethylene glycol, promethazine  (PHENERGAN ) injection (IM or IVPB), sodium chloride , sodium chloride  flush, sodium chloride  flush   Allergies: SocHx: FamHx:     Vitals    Physical Exam Constitutional:  Comments:   Cardiovascular:     Rate and Rhythm: Normal rate and regular rhythm.     Heart sounds: No murmur heard.   Pulmonary:     Effort: Pulmonary effort is normal.     Comments:   Abdominal:     Palpations: Abdomen is soft.     Tenderness:   Musculoskeletal:        General: No swelling or tenderness.   Skin:    Comments:   Neurological:     General: No focal deficit present.   Psychiatric:        Mood and Affect: Mood normal.    Pertinent Microbiology -   Pertinent Lab seen by me: -  Pertinent Imagings/Other Imagings Plain films and CT images have been personally visualized and interpreted; radiology reports have been reviewed. Decision making incorporated into the Impression / Recommendations.   Annalee Orem, MD Infectious Disease Physician [facility] Pager: [phone]   "

## 2024-03-23 NOTE — Progress Notes (Signed)
" °   03/23/24 0000  BiPAP/CPAP/SIPAP  $ Non-Invasive Ventilator  Non-Invasive Vent Subsequent  BiPAP/CPAP/SIPAP Pt Type Adult  BiPAP/CPAP/SIPAP SERVO  Mask Type Full face mask  Dentures removed? Not applicable  Mask Size Large  Set Rate 22 breaths/min  Respiratory Rate 24 breaths/min  IPAP 24 cmH20  EPAP 10 cmH2O  FiO2 (%) 40 %  Minute Ventilation 13.3  Leak 19  Peak Inspiratory Pressure (PIP) 24  Tidal Volume (Vt) 601  Patient Home Machine No  Patient Home Mask No  Patient Home Tubing No  Auto Titrate No  Press High Alarm 30 cmH2O  Press Low Alarm 5 cmH2O  CPAP/SIPAP surface wiped down Yes  Device Plugged into RED Power Outlet Yes  Oxygen Percent 40 %  BiPAP/CPAP /SiPAP Vitals  Resp (!) 24  MEWS Score/Color  MEWS Score 2  MEWS Score Color Yellow    "

## 2024-03-23 NOTE — TOC CM/SW Note (Signed)
" °  °  Durable Medical Equipment  (From admission, onward)           Start     Ordered   03/21/24 1046  DME Oxygen  Once       Question Answer Comment  Length of Need 6 Months   Mode or (Route) Nasal cannula   Liters per Minute 2   Frequency Continuous (stationary and portable oxygen unit needed)   Oxygen conserving device Yes   Oxygen delivery system: Gas   Oxygen delivery system: Portable concentrator (POC)      03/21/24 1045            "

## 2024-03-24 ENCOUNTER — Telehealth: Payer: Self-pay | Admitting: *Deleted

## 2024-03-24 LAB — CULTURE, BLOOD (ROUTINE X 2)

## 2024-03-24 NOTE — Transitions of Care (Post Inpatient/ED Visit) (Signed)
 "  03/24/2024  Name: Christine Singleton MRN: [id number] DOB: June 25, 1990  Today's TOC FU Call Status: Today's TOC FU Call Status:: Successful TOC FU Call Completed TOC FU Call Complete Date: 03/24/24  Patient's Name and Date of Birth confirmed. Name, DOB  Transition Care Management Follow-up Telephone Call Date of Discharge: 03/23/24 Discharge Facility: [facility]) Type of Discharge: Inpatient Admission Primary Inpatient Discharge Diagnosis:: Acute respiratory failure with hypoxia and hypercapnia, How have you been since you were released from the hospital?: Better Any questions or concerns?: No  Items Reviewed: Did you receive and understand the discharge instructions provided?: Yes Medications obtained,verified, and reconciled?: Yes (Medications Reviewed) Any new allergies since your discharge?: No Dietary orders reviewed?: Yes Type of Diet Ordered:: Heart Healthy, low sodium Do you have support at home?: Yes People in Home [RPT]: alone Name of Support/Comfort Primary Source: Family is supportive  Medications Reviewed Today: Medications Reviewed Today     Reviewed by [name] LABOR, RN (Registered Nurse) on 03/24/24 at 1004  Med List Status: <None>   Medication Order Taking? Sig Documenting Provider Last Dose Status Informant  empagliflozin  (JARDIANCE ) 10 MG TABS tablet [id number] Yes Take 1 tablet (10 mg total) by mouth daily. Arrien, Elidia Sieving, MD  Active Self  guaiFENesin  (MUCINEX ) 600 MG 12 hr tablet [id number]  Take 2 tablets (1,200 mg total) by mouth 2 (two) times daily.  Patient not taking: Reported on 03/24/2024   Will Almarie MATSU, MD  Active   metFORMIN  (GLUCOPHAGE ) 500 MG tablet [id number] Yes Take 1 tablet (500 mg total) by mouth daily with breakfast. Noralee Elidia Sieving, MD  Active Self  OXYGEN [id number] Yes Inhale 3 L/min into the lungs at bedtime. [provider]  Active Self  rivaroxaban  (XARELTO ) 20 MG TABS tablet [id number] Yes Take 1 tablet  (20 mg total) by mouth daily. Zenaida Morene PARAS, MD  Active Self  spironolactone  (ALDACTONE ) 25 MG tablet [id number] Yes Take 1 tablet (25 mg total) by mouth daily. Arrien, Mauricio Daniel, MD  Active Self  torsemide  (DEMADEX ) 20 MG tablet [id number] Yes Take 1 tablet (20 mg total) by mouth daily. Arrien, Elidia Sieving, MD  Active Self  TYLENOL  500 MG tablet [id number] Yes Take 1 tablet (500 mg total) by mouth every 6 (six) hours as needed (for headaches). Arrien, Elidia Sieving, MD  Active Self            Home Care and Equipment/Supplies: Were Home Health Services Ordered?: NA Any new equipment or medical supplies ordered?: No  Functional Questionnaire: Do you need assistance with bathing/showering or dressing?: No Do you need assistance with meal preparation?: No Do you need assistance with eating?: No Do you have difficulty maintaining continence: No Do you need assistance with getting out of bed/getting out of a chair/moving?: No Do you have difficulty managing or taking your medications?: No  Follow up appointments reviewed: PCP Follow-up appointment confirmed?: Yes Date of PCP follow-up appointment?: 04/05/24 Follow-up Provider: Waddell Mon Specialist [facility] Follow-up appointment confirmed?: Yes Date of Specialist follow-up appointment?: 04/13/24 Follow-Up Specialty Provider:: Pulmonology Do you need transportation to your follow-up appointment?: No Do you understand care options if your condition(s) worsen?: Yes-patient verbalized understanding  SDOH Interventions Today    Flowsheet Row Most Recent Value  SDOH Interventions   Food Insecurity Interventions Intervention Not Indicated  Housing Interventions Intervention Not Indicated  Transportation Interventions Intervention Not Indicated  Utilities Interventions Intervention Not Indicated    Andrea Lucky RN, BSN [place]  [facility]  Population Health RN Care Manager [phone]  "

## 2024-03-26 LAB — CULTURE, BLOOD (ROUTINE X 2): Culture: NO GROWTH

## 2024-03-28 LAB — CULTURE, BLOOD (ROUTINE X 2)
Culture: NO GROWTH
Culture: NO GROWTH

## 2024-04-04 NOTE — Progress Notes (Incomplete)
 "   New Patient Office Visit   Subjective     Patient ID: Christine Singleton, female   DOB: May 04, 1990  Age: 34 y.o. MRN: [id number]   CC:  No chief complaint on file.     HPI Christine Singleton presents to establish care.   Influenza B; Hypoxia - Seen in the emergency department complaining of shortness of breath. 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  - Associated symptoms include productive cough with brown sputum.  - 60% SpO2 on arrival, placed on high-flow nasal cannula O2 and BiPAP. She tested positive for Influenza B with CTA showing multifocal PNA. Treated with Rocephin , Zithromax , Doxycycline , and Tamiflu .    Sleep Apnea; History of PE: - Management: CPAP night for OSA. Xarelto  20 mg daily for anticoagulation.  - No PE noted on most recent CTA.    Hypertension; CHF: - Medications: Torsemide  20 mg daily and Spirinolactone 25 mg daily.  - Compliance: *** - Checking BP at home: *** - Denies any SOB, recurrent headaches, CP, vision changes, LE edema, dizziness, palpitations, or medication side effects. - Diet: *** - Exercise: *** - Stressors: BP Readings from Last 3 Encounters:  03/23/24 124/66  02/16/24 126/82  01/05/24 112/67    Diabetes: - Checking glucose at home: *** - Medications: Metformin  500 mg daily and Jardiance  10 mg daily.  - Compliance: *** - Diet: *** - Exercise: *** - Eye exam: Due - Foot exam: Due - Microalbumin: Due - Denies symptoms of hypoglycemia, polyuria, polydipsia, numbness extremities, foot ulcers/trauma, wounds that are not healing, medication side effects  Lab Results  Component Value Date   HGBA1C 7.1 (H) 03/15/2024    Show/hide medication list[1] Past Medical History:  Diagnosis Date   CHF (congestive heart failure) (HCC)    Gestational diabetes    With first pregnancy   History of cesarean delivery 04/23/2016   Hypertension    PE (pulmonary thromboembolism) (HCC)    Sleep apnea     Past Surgical History:   Procedure Laterality Date   CESAREAN SECTION     CESAREAN SECTION N/A 02/03/2016   Procedure: CESAREAN SECTION;  Surgeon: Burnard VEAR Pate, MD;  Location: [facility]IR[facility]ING SUIT[facility];  Service: Obstetrics;  Laterality: N/A;     Family History  Problem Relation Age of Onset   Migraines Mother    Hypertension Mother    Hyperlipidemia Mother    Hypertension Father    Hyperlipidemia Father    Asthma Sister    Cancer Neg Hx    Diabetes Neg Hx     Social History   Socioeconomic History   Marital status: Single    Spouse name: Not on file   Number of children: 2   Years of education: Not on file   Highest education level: High school graduate  Occupational History   Occupation: [employer]  Tobacco Use   Smoking status: Never   Smokeless tobacco: Never  Vaping Use   Vaping status: Never Used  Substance and Sexual Activity   Alcohol use: No   Drug use: Yes    Types: Marijuana    Comment: before admission, smokes everyday   Sexual activity: Yes    Birth control/protection: None  Other Topics Concern   Not on file  Social History Narrative   Not on file   Social Drivers of Health   Tobacco Use: Low Risk (03/14/2024)   Patient History    Smoking Tobacco Use: Never    Smokeless Tobacco Use: Never    Passive  Exposure: Not on file  Financial Resource Strain: High Risk (09/08/2022)   Overall Financial Resource Strain (CARDIA)    Difficulty of Paying Living Expenses: Hard  Food Insecurity: No Food Insecurity (03/24/2024)   Epic    Worried About Programme Researcher, Broadcasting/film/video in the Last Year: Never true    Ran Out of Food in the Last Year: Never true  Recent Concern: Food Insecurity - Food Insecurity Present (01/03/2024)   Epic    Worried About Programme Researcher, Broadcasting/film/video in the Last Year: Sometimes true    [employer] of Food in the Last Year: Sometimes true  Transportation Needs: No Transportation Needs (03/24/2024)   Epic    Lack of Transportation (Medical): No    Lack of Transportation (Non-Medical):  No  Physical Activity: Not on file  Stress: Not on file  Social Connections: Unknown (01/03/2024)   Social Connection and Isolation Panel    Frequency of Communication with Friends and Family: Not on file    Frequency of Social Gatherings with Friends and Family: Not on file    Attends Religious Services: Not on file    Active Member of Clubs or Organizations: Not on file    Attends Banker Meetings: Not on file    Marital Status: Never married  Depression (PHQ2-9): Low Risk (03/24/2024)   Depression (PHQ2-9)    PHQ-2 Score: 0  Alcohol Screen: Low Risk (08/18/2022)   Alcohol Screen    Last Alcohol Screening Score (AUDIT): 0  Housing: Unknown (03/24/2024)   Epic    Unable to Pay for Housing in the Last Year: No    Number of Times Moved in the Last Year: Not on file    Homeless in the Last Year: No  Recent Concern: Housing - High Risk (03/14/2024)   Epic    Unable to Pay for Housing in the Last Year: Yes    Number of Times Moved in the Last Year: 1    Homeless in the Last Year: No  Utilities: Not At Risk (03/24/2024)   Epic    Threatened with loss of utilities: No  Recent Concern: Utilities - At Risk (03/14/2024)   Epic    Threatened with loss of utilities: Yes  Health Literacy: Not on file       ROS All review of systems negative except what is listed in the HPI    Objective     There were no vitals taken for this visit.  Physical Exam     Assessment & Plan:     Problem List Items Addressed This Visit   None Visit Diagnoses       Encounter for medical examination to establish care    -  Primary                No follow-ups on file.  Waddell KATHEE Mon, NP  I,Emily Lagle,acting as a scribe for Waddell KATHEE Mon, NP.,have documented all relevant documentation on the behalf of Waddell KATHEE Mon, NP.  I, Waddell KATHEE Mon, NP, have reviewed all documentation for this visit. The documentation on 04/05/2024 for the exam, diagnosis, procedures, and orders are all  accurate and complete.     [1]  Outpatient Medications Prior to Visit  Medication Sig   empagliflozin  (JARDIANCE ) 10 MG TABS tablet Take 1 tablet (10 mg total) by mouth daily.   guaiFENesin  (MUCINEX ) 600 MG 12 hr tablet Take 2 tablets (1,200 mg total) by mouth 2 (two) times daily. (Patient not taking:  Reported on 03/24/2024)   metFORMIN  (GLUCOPHAGE ) 500 MG tablet Take 1 tablet (500 mg total) by mouth daily with breakfast.   OXYGEN Inhale 3 L/min into the lungs at bedtime.   rivaroxaban  (XARELTO ) 20 MG TABS tablet Take 1 tablet (20 mg total) by mouth daily.   spironolactone  (ALDACTONE ) 25 MG tablet Take 1 tablet (25 mg total) by mouth daily.   torsemide  (DEMADEX ) 20 MG tablet Take 1 tablet (20 mg total) by mouth daily.   TYLENOL  500 MG tablet Take 1 tablet (500 mg total) by mouth every 6 (six) hours as needed (for headaches).   No facility-administered medications prior to visit.   "

## 2024-04-05 ENCOUNTER — Encounter: Payer: Self-pay | Admitting: Family Medicine

## 2024-04-05 ENCOUNTER — Ambulatory Visit: Admitting: Family Medicine

## 2024-04-05 ENCOUNTER — Telehealth (HOSPITAL_BASED_OUTPATIENT_CLINIC_OR_DEPARTMENT_OTHER): Payer: Self-pay

## 2024-04-05 ENCOUNTER — Other Ambulatory Visit (HOSPITAL_BASED_OUTPATIENT_CLINIC_OR_DEPARTMENT_OTHER): Payer: Self-pay

## 2024-04-05 ENCOUNTER — Other Ambulatory Visit (HOSPITAL_COMMUNITY): Payer: Self-pay

## 2024-04-05 ENCOUNTER — Other Ambulatory Visit (HOSPITAL_COMMUNITY)
Admission: RE | Admit: 2024-04-05 | Discharge: 2024-04-05 | Disposition: A | Discharge status: Home / Self Care | Source: Ambulatory Visit | Attending: Family Medicine | Admitting: Family Medicine

## 2024-04-05 VITALS — BP 116/49 | HR 102 | Ht 62.0 in | Wt 335.0 lb

## 2024-04-05 DIAGNOSIS — E119 Type 2 diabetes mellitus without complications: Secondary | ICD-10-CM | POA: Diagnosis not present

## 2024-04-05 DIAGNOSIS — I509 Heart failure, unspecified: Secondary | ICD-10-CM | POA: Insufficient documentation

## 2024-04-05 DIAGNOSIS — Z6841 Body Mass Index (BMI) 40.0 and over, adult: Secondary | ICD-10-CM | POA: Diagnosis not present

## 2024-04-05 DIAGNOSIS — G4733 Obstructive sleep apnea (adult) (pediatric): Secondary | ICD-10-CM

## 2024-04-05 DIAGNOSIS — I2699 Other pulmonary embolism without acute cor pulmonale: Secondary | ICD-10-CM | POA: Diagnosis not present

## 2024-04-05 DIAGNOSIS — N898 Other specified noninflammatory disorders of vagina: Secondary | ICD-10-CM | POA: Diagnosis present

## 2024-04-05 DIAGNOSIS — Z7984 Long term (current) use of oral hypoglycemic drugs: Secondary | ICD-10-CM | POA: Diagnosis not present

## 2024-04-05 DIAGNOSIS — Z23 Encounter for immunization: Secondary | ICD-10-CM | POA: Diagnosis not present

## 2024-04-05 DIAGNOSIS — N914 Secondary oligomenorrhea: Secondary | ICD-10-CM

## 2024-04-05 DIAGNOSIS — Z7985 Long-term (current) use of injectable non-insulin antidiabetic drugs: Secondary | ICD-10-CM | POA: Diagnosis not present

## 2024-04-05 DIAGNOSIS — I10 Essential (primary) hypertension: Secondary | ICD-10-CM | POA: Diagnosis not present

## 2024-04-05 DIAGNOSIS — Z1159 Encounter for screening for other viral diseases: Secondary | ICD-10-CM

## 2024-04-05 DIAGNOSIS — Z0000 Encounter for general adult medical examination without abnormal findings: Secondary | ICD-10-CM

## 2024-04-05 DIAGNOSIS — Z86711 Personal history of pulmonary embolism: Secondary | ICD-10-CM

## 2024-04-05 LAB — CBC WITH DIFFERENTIAL/PLATELET
Basophils Absolute: 0.1 K/uL (ref 0.0–0.1)
Basophils Relative: 0.5 % (ref 0.0–3.0)
Eosinophils Absolute: 0.1 K/uL (ref 0.0–0.7)
Eosinophils Relative: 0.8 % (ref 0.0–5.0)
HCT: 41.7 % (ref 36.0–46.0)
Hemoglobin: 13.4 g/dL (ref 12.0–15.0)
Lymphocytes Relative: 21.9 % (ref 12.0–46.0)
Lymphs Abs: 2.7 K/uL (ref 0.7–4.0)
MCHC: 32 g/dL (ref 30.0–36.0)
MCV: 70.3 fl — ABNORMAL LOW (ref 78.0–100.0)
Monocytes Absolute: 0.7 K/uL (ref 0.1–1.0)
Monocytes Relative: 5.4 % (ref 3.0–12.0)
Neutro Abs: 8.8 K/uL — ABNORMAL HIGH (ref 1.4–7.7)
Neutrophils Relative %: 71.4 % (ref 43.0–77.0)
Platelets: 360 K/uL (ref 150.0–400.0)
RBC: 5.94 Mil/uL — ABNORMAL HIGH (ref 3.87–5.11)
RDW: 22.4 % — ABNORMAL HIGH (ref 11.5–15.5)
WBC: 12.4 K/uL — ABNORMAL HIGH (ref 4.0–10.5)

## 2024-04-05 LAB — BASIC METABOLIC PANEL WITH GFR
BUN: 5 mg/dL — ABNORMAL LOW (ref 6–23)
CO2: 37 meq/L — ABNORMAL HIGH (ref 19–32)
Calcium: 9.3 mg/dL (ref 8.4–10.5)
Chloride: 96 meq/L (ref 96–112)
Creatinine, Ser: 0.54 mg/dL (ref 0.40–1.20)
GFR: 122.9 mL/min
Glucose, Bld: 121 mg/dL — ABNORMAL HIGH (ref 70–99)
Potassium: 4.2 meq/L (ref 3.5–5.1)
Sodium: 138 meq/L (ref 135–145)

## 2024-04-05 LAB — MICROALBUMIN / CREATININE URINE RATIO
Creatinine,U: 151.3 mg/dL
Microalb Creat Ratio: 15.9 mg/g (ref 0.0–30.0)
Microalb, Ur: 2.4 mg/dL — ABNORMAL HIGH (ref 0.7–1.9)

## 2024-04-05 LAB — LIPID PANEL
Cholesterol: 161 mg/dL (ref 28–200)
HDL: 44.5 mg/dL
LDL Cholesterol: 98 mg/dL (ref 10–99)
NonHDL: 116.77
Total CHOL/HDL Ratio: 4
Triglycerides: 93 mg/dL (ref 10.0–149.0)
VLDL: 18.6 mg/dL (ref 0.0–40.0)

## 2024-04-05 LAB — HEPATITIS C ANTIBODY: Hepatitis C Ab: NONREACTIVE

## 2024-04-05 LAB — TSH: TSH: 0.84 u[IU]/mL (ref 0.35–5.50)

## 2024-04-05 MED ORDER — TRULICITY 0.75 MG/0.5ML ~~LOC~~ SOAJ
0.7500 mg | SUBCUTANEOUS | 1 refills | Status: AC
  Filled 2024-04-05: qty 2, 28d supply, fill #0

## 2024-04-05 NOTE — Assessment & Plan Note (Signed)
 On Xarelto 

## 2024-04-05 NOTE — Assessment & Plan Note (Signed)
 Stable. Heart failure clinic recently signed off and referred back to general cardiology - patient to schedule follow-up.  Continue current meds.  No signs of fluid overload today.  Continue with lifestyle measures.

## 2024-04-05 NOTE — Telephone Encounter (Signed)
 Pharmacy Patient Advocate Encounter   Received notification from Pt Calls Messages that prior authorization for Trulicity  0.75MG /0.5ML auto-injectors  is required/requested.   Insurance verification completed.   The patient is insured through [employer].   Per test claim: PA required; PA submitted to above mentioned insurance via Latent Key/confirmation #/EOC [id number] Status is pending

## 2024-04-05 NOTE — Assessment & Plan Note (Signed)
 Upcoming visit with pulmonology. Not currently using CPAP. May need updated sleep study.

## 2024-04-05 NOTE — Patient Instructions (Signed)
 Thank you for choosing [facility] at [facility] for your Primary Care needs. I am excited for the opportunity to partner with you to meet your health care goals. It was a pleasure meeting you today!  Information on diet, exercise, and health maintenance recommendations are listed below. This is information to help you be sure you are on track for optimal health and monitoring.   Please look over this and let us know if you have any questions or if you have completed any of the health maintenance outside of [facility] so that we can be sure your records are up to date.  ___________________________________________________________  MyChart:  For all urgent or time sensitive needs we ask that you please call the office to avoid delays. Our number is [phone]. MyChart is not constantly monitored and due to the large volume of messages a day, replies may take up to 72 business hours.  MyChart Policy: MyChart allows for you to see your visit notes, after visit summary, provider recommendations, lab and tests results, make an appointment, request refills, and contact your provider or the office for non-urgent questions or concerns. Providers are seeing patients during normal business hours and do not have built in time to review MyChart messages.  We ask that you allow a minimum of 3 business days for responses to [employer]. For this reason, please do not send urgent requests through MyChart. Please call the office at [phone]. New and ongoing conditions may require a visit. We have virtual and in-person visits available for your convenience.  Complex MyChart concerns may require a visit. Your provider may request you schedule a virtual or in-person visit to ensure we are providing the best care possible. MyChart messages sent after 11:00 AM on Friday may not be received by the provider until Monday morning.    Lab and Test Results: You will receive your lab and test  results on MyChart as soon as they are completed and results have been sent by the lab or testing facility. Due to this service, you will receive your results BEFORE your provider.  I review lab and test results each morning prior to seeing patients. Some results require collaboration with other providers to ensure you are receiving the most appropriate care. For this reason, we ask that you please allow a minimum of 3-5 business days from the time that ALL results have been received for your provider to receive and review lab and test results and contact you about these.  Most lab and test result comments from the provider will be sent through MyChart. Your provider may recommend changes to the plan of care, follow-up visits, repeat testing, ask questions, or request an office visit to discuss these results. You may reply directly to this message or call the office to provide information for the provider or set up an appointment. In some instances, you will be called with test results and recommendations. Please let us know if this is preferred and we will make note of this in your chart to provide this for you.    If you have not heard a response to your lab or test results in 5 business days from all results returning to MyChart, please call the office to let us know. We ask that you please avoid calling prior to this time unless there is an emergent concern. Due to high call volumes, this can delay the resulting process.  After Hours: For all non-emergency after hours needs, please  call the office at [phone] and select the option to reach the on-call  service. On-call services are shared between multiple [place] offices and therefore it will not be possible to speak directly with your provider. On-call providers may provide medical advice and recommendations, but are unable to provide refills for maintenance medications.  For all emergency or urgent medical needs after normal business hours, we  recommend that you seek care at the closest Urgent Care or Emergency Department to ensure appropriate treatment in a timely manner.  [facility] has a 24 hour emergency room located on the ground floor for your convenience.   Urgent Concerns During the Business Day Providers are seeing patients from 8AM to 5PM with a busy schedule and are most often not able to respond to non-urgent calls until the end of the day or the next business day. If you should have URGENT concerns during the day, please call and speak to the nurse or schedule a same day appointment so that we can address your concern without delay.   Thank you, again, for choosing me as your health care partner. I appreciate your trust and look forward to learning more about you!   Lollie Marrow Reola Calkins, DNP, FNP-C  ___________________________________________________________  Health Maintenance Recommendations Screening Testing Mammogram Every 1-2 years based on history and risk factors Starting at age 60 Pap Smear Ages 21-39 every 3 years Ages 22-65 every 5 years with HPV testing More frequent testing may be required based on results and history Colon Cancer Screening Every 1-10 years based on test performed, risk factors, and history Starting at age 29 Bone Density Screening Every 2-10 years based on history Starting at age 79 for women Recommendations for men differ based on medication usage, history, and risk factors AAA Screening One time ultrasound Men 59-28 years old who have ever smoked Lung Cancer Screening Low Dose Lung CT every 12 months Age 51-80 years with a 20 pack-year smoking history who still smoke or who have quit within the last 15 years  Screening Labs Routine  Labs: Complete Blood Count (CBC), Complete Metabolic Panel (CMP), Cholesterol (Lipid Panel) Every 6-12 months based on history and medications May be recommended more frequently based on current conditions or previous results Hemoglobin  A1c Lab Every 3-12 months based on history and previous results Starting at age 79 or earlier with diagnosis of diabetes, high cholesterol, BMI >26, and/or risk factors Frequent monitoring for patients with diabetes to ensure blood sugar control Thyroid Panel  Every 6 months based on history, symptoms, and risk factors May be repeated more often if on medication HIV One time testing for all patients 66 and older May be repeated more frequently for patients with increased risk factors or exposure Hepatitis C One time testing for all patients 59 and older May be repeated more frequently for patients with increased risk factors or exposure Gonorrhea, Chlamydia Every 12 months for all sexually active persons 13-24 years Additional monitoring may be recommended for those who are considered high risk or who have symptoms PSA Men 69-17 years old with risk factors Additional screening may be recommended from age 43-69 based on risk factors, symptoms, and history  Vaccine Recommendations Tetanus Booster All adults every 10 years Flu Vaccine All patients 6 months and older every year COVID Vaccine All patients 12 years and older Initial dosing with booster May recommend additional booster based on age and health history HPV Vaccine 2 doses all patients age 71-26 Dosing may be considered  for patients over 26 Shingles Vaccine (Shingrix) 2 doses all adults 50 years and older Pneumonia (Pneumovax 23) All adults 65 years and older May recommend earlier dosing based on health history Pneumonia (Prevnar 53) All adults 65 years and older Dosed 1 year after Pneumovax 23 Pneumonia (Prevnar 20) All adults 65 years and older (adults 19-64 with certain conditions or risk factors) 1 dose  For those who have not received Prevnar 13 vaccine previously   Additional Screening, Testing, and Vaccinations may be recommended on an individualized basis based on family history, health history, risk  factors, and/or exposure.  __________________________________________________________  Diet Recommendations for All Patients  I recommend that all patients maintain a diet low in saturated fats, carbohydrates, and cholesterol. While this can be challenging at first, it is not impossible and small changes can make big differences.  Things to try: Decreasing the amount of soda, sweet tea, and/or juice to one or less per day and replace with water While water is always the first choice, if you do not like water you may consider adding a water additive without sugar to improve the taste other sugar free drinks Replace potatoes with a brightly colored vegetable  Use healthy oils, such as canola oil or olive oil, instead of butter or hard margarine Limit your bread intake to two pieces or less a day Replace regular pasta with low carb pasta options Bake, broil, or grill foods instead of frying Monitor portion sizes  Eat smaller, more frequent meals throughout the day instead of large meals  An important thing to remember is, if you love foods that are not great for your health, you don't have to give them up completely. Instead, allow these foods to be a reward when you have done well. Allowing yourself to still have special treats every once in a while is a nice way to tell yourself thank you for working hard to keep yourself healthy.   Also remember that every day is a new day. If you have a bad day and "fall off the wagon", you can still climb right back up and keep moving along on your journey!  We have resources available to help you!  Some websites that may be helpful include: www.http://www.wall-moore.info/  Www.VeryWellFit.com _____________________________________________________________  Activity Recommendations for All Patients  I recommend that all adults get at least 30 minutes of moderate physical activity that elevates your heart rate at least 5 days out of the week.  Some examples  include: Walking or jogging at a pace that allows you to carry on a conversation Cycling (stationary bike or outdoors) Water aerobics Yoga Weight lifting Dancing If physical limitations prevent you from putting stress on your joints, exercise in a pool or seated in a chair are excellent options.  Do determine your MAXIMUM heart rate for activity: 220 - YOUR AGE = MAX Heart Rate   Remember! Do not push yourself too hard.  Start slowly and build up your pace, speed, weight, time in exercise, etc.  Allow your body to rest between exercise and get good sleep. You will need more water than normal when you are exerting yourself. Do not wait until you are thirsty to drink. Drink with a purpose of getting in at least 8, 8 ounce glasses of water a day plus more depending on how much you exercise and sweat.    If you begin to develop dizziness, chest pain, abdominal pain, jaw pain, shortness of breath, headache, vision changes, lightheadedness, or other concerning symptoms,  stop the activity and allow your body to rest. If your symptoms are severe, seek emergency evaluation immediately. If your symptoms are concerning, but not severe, please let us know so that we can recommend further evaluation.

## 2024-04-05 NOTE — Assessment & Plan Note (Signed)
 Blood pressure is at goal for age and co-morbidities.   Recommendations: continue current regimen and following with cardiology - BP goal <130/80 - monitor and log blood pressures at home - check around the same time each day in a relaxed setting - Limit salt to <2000 mg/day - Follow DASH eating plan (heart healthy diet) - limit alcohol to 2 standard drinks per day for men and 1 per day for women - avoid tobacco products - get at least 2 hours of regular aerobic exercise weekly Patient aware of signs/symptoms requiring further/urgent evaluation.

## 2024-04-05 NOTE — Assessment & Plan Note (Signed)
 Discussed benefits of weight loss for heart health and diabetes management. - Prescribed Trulicity   - Already referred to weight loss clinic for further management. - Encouraged regular exercise and dietary improvements.

## 2024-04-05 NOTE — Assessment & Plan Note (Signed)
 Recent A1c at 7.1%. Stable - Prescribed Trulicity  for diabetes management and weight loss. - Continue metformin  and Jardiance . - Monitor blood sugar levels closely. - Check urine microalbumin/creatinine ratio

## 2024-04-05 NOTE — Assessment & Plan Note (Signed)
 Recent period in December; previously only one other period in 2025. Previous hormone therapy ineffective. Concerns about hormone use due to history of blood clot. - Referred to [facility] for further evaluation and management.

## 2024-04-06 ENCOUNTER — Ambulatory Visit

## 2024-04-06 ENCOUNTER — Ambulatory Visit: Payer: Self-pay | Admitting: Family Medicine

## 2024-04-06 ENCOUNTER — Other Ambulatory Visit (HOSPITAL_BASED_OUTPATIENT_CLINIC_OR_DEPARTMENT_OTHER): Payer: Self-pay

## 2024-04-06 DIAGNOSIS — R718 Other abnormality of red blood cells: Secondary | ICD-10-CM | POA: Diagnosis not present

## 2024-04-06 DIAGNOSIS — B3731 Acute candidiasis of vulva and vagina: Secondary | ICD-10-CM

## 2024-04-06 LAB — IBC + FERRITIN
Ferritin: 35.1 ng/mL (ref 10.0–291.0)
Iron: 39 ug/dL — ABNORMAL LOW (ref 42–145)
Saturation Ratios: 10.3 % — ABNORMAL LOW (ref 20.0–50.0)
TIBC: 378 ug/dL (ref 250.0–450.0)
Transferrin: 270 mg/dL (ref 212.0–360.0)

## 2024-04-06 LAB — CERVICOVAGINAL ANCILLARY ONLY
Bacterial Vaginitis (gardnerella): NEGATIVE
Candida Glabrata: NEGATIVE
Candida Vaginitis: POSITIVE — AB
Chlamydia: NEGATIVE
Comment: NEGATIVE
Comment: NEGATIVE
Comment: NEGATIVE
Comment: NEGATIVE
Comment: NEGATIVE
Comment: NORMAL
Neisseria Gonorrhea: NEGATIVE
Trichomonas: NEGATIVE

## 2024-04-06 MED ORDER — FLUCONAZOLE 150 MG PO TABS
150.0000 mg | ORAL_TABLET | Freq: Every day | ORAL | 0 refills | Status: AC
  Filled 2024-04-06: qty 2, 3d supply, fill #0
  Filled 2024-04-22: qty 2, 2d supply, fill #0

## 2024-04-06 NOTE — Telephone Encounter (Signed)
 Please advise on CO2 question

## 2024-04-06 NOTE — Telephone Encounter (Signed)
 She is not currently on a GLP-1. Previously did well on Trulicity  - addendum added to note to try for PA.

## 2024-04-06 NOTE — Telephone Encounter (Signed)
 Pharmacy Patient Advocate Encounter  Received notification from [facility] [employer] that Prior Authorization for Trulicity  0.75MG /0.5ML auto-injectors  has been DENIED.  See denial reason below. No denial letter attached in CMM. Will attach denial letter to Media tab once received.   PA #/Case ID/Reference #: [id number]

## 2024-04-07 ENCOUNTER — Encounter: Payer: Self-pay | Admitting: Family Medicine

## 2024-04-07 ENCOUNTER — Ambulatory Visit (INDEPENDENT_AMBULATORY_CARE_PROVIDER_SITE_OTHER): Admitting: Family Medicine

## 2024-04-07 ENCOUNTER — Telehealth: Payer: Self-pay | Admitting: Pharmacist

## 2024-04-07 VITALS — BP 106/75 | HR 118 | Temp 98.0°F | Ht 61.5 in | Wt 329.0 lb

## 2024-04-07 DIAGNOSIS — I509 Heart failure, unspecified: Secondary | ICD-10-CM | POA: Diagnosis not present

## 2024-04-07 DIAGNOSIS — R5383 Other fatigue: Secondary | ICD-10-CM | POA: Diagnosis not present

## 2024-04-07 DIAGNOSIS — Z86711 Personal history of pulmonary embolism: Secondary | ICD-10-CM

## 2024-04-07 DIAGNOSIS — N926 Irregular menstruation, unspecified: Secondary | ICD-10-CM

## 2024-04-07 DIAGNOSIS — E119 Type 2 diabetes mellitus without complications: Secondary | ICD-10-CM

## 2024-04-07 DIAGNOSIS — G4733 Obstructive sleep apnea (adult) (pediatric): Secondary | ICD-10-CM

## 2024-04-07 DIAGNOSIS — Z7985 Long-term (current) use of injectable non-insulin antidiabetic drugs: Secondary | ICD-10-CM | POA: Diagnosis not present

## 2024-04-07 DIAGNOSIS — R0602 Shortness of breath: Secondary | ICD-10-CM | POA: Diagnosis not present

## 2024-04-07 DIAGNOSIS — Z1331 Encounter for screening for depression: Secondary | ICD-10-CM | POA: Diagnosis not present

## 2024-04-07 DIAGNOSIS — Z7984 Long term (current) use of oral hypoglycemic drugs: Secondary | ICD-10-CM

## 2024-04-07 DIAGNOSIS — Z6841 Body Mass Index (BMI) 40.0 and over, adult: Secondary | ICD-10-CM | POA: Diagnosis not present

## 2024-04-07 NOTE — Telephone Encounter (Signed)
 Appeal has been submitted. Will advise when response is received, please be advised that most companies may take 30 days to make a decision. Appeal letter and supporting documentation have been faxed to [phone] on 04/07/2024 @1 :59 pm.  Thank you, Devere Pandy, PharmD Clinical Pharmacist  [place]  Direct Dial: [phone]

## 2024-04-07 NOTE — Telephone Encounter (Signed)
 I am forwarding to the appeals team

## 2024-04-07 NOTE — Progress Notes (Signed)
 "  At a Glance:  Vitals Temp: 98 F (36.7 C) BP: 106/75 Pulse Rate: (!) 118 SpO2: 100 %   Anthropometric Measurements Height: 5' 1.5 (1.562 m) Weight: (!) 329 lb (149.2 kg) BMI (Calculated): 61.16 Starting Weight: 329lb   Body Composition  Body Fat %: 60.8 % Fat Mass (lbs): 200 lbs Muscle Mass (lbs): 122.4 lbs Visceral Fat Rating : 25   Other Clinical Data RMR: 2117 Fasting: yes Labs: yes Today's Visit #: 1 Starting Date: 04/07/24    EKG: reviewed from 03/22/24 showing sinus tachycardia, HR 102 BPM with RAE and anterior lead TWI  Indirect Calorimeter completed today shows a VO2 of 307 and a REE of 2117.  Her calculated basal metabolic rate is 7999 thus her basal metabolic rate is better than expected.  Chief Complaint:  Obesity   Subjective:  Christine Singleton (MR# [id number]) is a 34 y.o. female who presents for evaluation and treatment of obesity and related comorbidities.   Christine Singleton is currently in the action stage of change and ready to dedicate time achieving and maintaining a healthier weight. Christine Singleton is interested in becoming our patient and working on intensive lifestyle modifications including (but not limited to) diet and exercise for weight loss.  Christine Singleton has been struggling with her weight. She has been unsuccessful in either losing weight, maintaining weight loss, or reaching her healthy weight goal.  She is a single mom to 2 kids and her 29 yo brother lives with them.  She does the cooking and grocery shopping.  She denies a fam hx of obesity.  She has had a T2DM diagnosis since her CHF diagnosis in 2024.  Her PCP recently restarted Trulicity  (hasn't started yet) but she previously didn't see any weight loss while on it.  She has considered weight loss surgery.  Christine Singleton's habits were reviewed today and are as follows: her desired weight loss is over 100 lb, she has been heavy most of her life, she has significant food cravings issues, she snacks frequently in the  evenings, she is frequently drinking liquids with calories, she frequently makes poor food choices, she has problems with excessive hunger, she frequently eats larger portions than normal, and she struggles with emotional eating.  Other Fatigue Christine Singleton admits to daytime somnolence and admits to waking up still tired. Patient has a history of symptoms of daytime fatigue. Jett generally gets 7 hours of sleep per night, and states that she has nightime awakenings. Snoring is present. Apneic episodes are present. Epworth Sleepiness Score is 13.   Shortness of Breath Christine Singleton notes increasing shortness of breath with exercising and seems to be worsening over time with weight gain. She notes getting out of breath sooner with activity than she used to. This has gotten worse recently. Christine Singleton denies shortness of breath at rest or orthopnea.   Depression Screen Christine Singleton's Food and Mood (modified PHQ-9) score was 17.     04/07/2024    7:27 AM  Depression screen PHQ 2/9  Decreased Interest 1  Down, Depressed, Hopeless 0  PHQ - 2 Score 1  Altered sleeping 3  Tired, decreased energy 2  Change in appetite 1  Feeling bad or failure about yourself  1  Trouble concentrating 0  Moving slowly or fidgety/restless 0  Suicidal thoughts 0  PHQ-9 Score 8  Difficult doing work/chores Somewhat difficult     Assessment and Plan:   Other Fatigue Christine Singleton does feel that her weight is causing her energy to be lower than it should be.  Fatigue may be related to obesity, depression or many other causes. Labs will be ordered, and in the meanwhile, Christine Singleton will focus on self care including making healthy food choices, increasing physical activity and focusing on stress reduction.  Shortness of Breath Christine Singleton does feel that she gets out of breath more easily that she used to when she exercises. Christine Singleton's shortness of breath appears to be obesity related and exercise induced. She has agreed to work on weight loss and  gradually increase exercise to treat her exercise induced shortness of breath. Will continue to monitor closely.  Christine Singleton had a positive depression screening. Depression is commonly associated with obesity and often results in emotional eating behaviors. We will monitor this closely and work on CBT to help improve the non-hunger eating patterns. Referral to Psychology may be required if no improvement is seen as she continues in our clinic.    Problem List Items Addressed This Visit     Non-insulin  dependent type 2 diabetes mellitus (HCC) Lab Results  Component Value Date   HGBA1C 7.1 (H) 03/15/2024  She will be starting back on Trulicity , prescribed by her PCP at 0.75 mg once weekly injection.  Previously did well on Trulicity  without weight loss.  Had nausea from Ozempic  in the past.  She has consider the role of weight loss surgery for obesity and diabetes treatment.  She is doing well on Jardiance  10 mg once daily and metformin  500 mg once daily with good compliance.  Begin prescribed diet which is low in added sugar.  Begin active plan for weight loss.    OSA (obstructive sleep apnea) Keep upcoming follow-up with Dr. Olena with [facility] pulmonology scheduled for 04/13/2024.  She currently has a CPAP and nocturnal oxygen but will be repeating a polysomnogram for potential use of BiPAP which she had better success with in the hospital.  Begin active plan for weight loss.    History of pulmonary embolism Currently on Xarelto  20 mg once daily, prescribed by Dr. Zenaida    CHF (congestive heart failure) [facility]) She reports some localized edema in her abdomen and weight currently on torsemide  20 mg once daily.  Modifications to prescribed meal plan made to cut out high sodium food products.  Recommend greatly reducing frequency of meals due to high sodium content.  Look for improvements with dietary change, regular exercise and weight loss.  Continue current medications as prescribed by cardiology.   Reviewed echocardiogram dated 12/10/2023 with a left ventricular ejection fraction of 60 to 65%.   Other Visit Diagnoses       SOBOE (shortness of breath on exertion)    -  Primary     Other fatigue       Relevant Orders   VITAMIN D 25 Hydroxy (Vit-D Deficiency, Fractures)   Insulin , random   Folate   Comprehensive metabolic panel with GFR   Vitamin B12     Depression screen  Will monitor for emotional eating behaviors and anxiety at night affecting sleep.  She may benefit from cognitive behavioral therapy.        Irregular menses   She is currently not on birth control.  Her menses are irregular, often skipping months.  She reports that her last menstrual cycle while in the hospital was 10 days long.  Will obtain iron levels today and screen for PCOS.   Relevant Orders   Ferritin   Iron and TIBC   Testosterone     Morbid obesity (HCC)  BMI 60.0-69.9, adult (HCC)           Christine Singleton is currently in the action stage of change and her goal is to get back to weightloss efforts . I recommend Christine Singleton begin the structured treatment plan as follows:  She has agreed to Category 3 Plan + 100 additional snack calories (( 1600 cal/ day ))  Exercise goals: All adults should avoid inactivity. Some activity is better than none, and adults who participate in any amount of physical activity, gain some health benefits. - chair exercises with small weights, walking driveway for a total of 15 min 5 days/ wk  Behavioral modification strategies:increasing lean protein intake, increasing vegetables, increase H2O intake, decrease liquid calories, decrease ETOH, decreasing eating out, no skipping meals, meal planning and cooking strategies, keeping healthy foods in the home, better snacking choices, avoiding temptations, planning for success, and decrease junk food   She was informed of the importance of frequent follow-up visits to maximize her success with intensive lifestyle modifications for her  multiple health conditions. She was informed we would discuss her lab results at her next visit unless there is a critical issue that needs to be addressed sooner. Christine Singleton agreed to keep her next visit at the agreed upon time to discuss these results.  Objective:  General: Cooperative, alert, well developed, in no acute distress. HEENT: Conjunctivae and lids unremarkable. Cardiovascular: Regular rhythm.  Lungs: Normal work of breathing. Neurologic: No focal deficits.   Lab Results  Component Value Date   CREATININE 0.54 04/05/2024   BUN 5 (L) 04/05/2024   NA 138 04/05/2024   K 4.2 04/05/2024   CL 96 04/05/2024   CO2 37 (H) 04/05/2024   Lab Results  Component Value Date   ALT 21 03/22/2024   AST 24 03/22/2024   ALKPHOS 115 03/22/2024   BILITOT 0.9 03/22/2024   Lab Results  Component Value Date   HGBA1C 7.1 (H) 03/15/2024   HGBA1C 6.6 (H) 01/27/2023   HGBA1C 7.1 (H) 08/18/2022   HGBA1C 7.3 (H) 08/17/2022   No results found for: INSULIN  Lab Results  Component Value Date   TSH 0.84 04/05/2024   Lab Results  Component Value Date   CHOL 161 04/05/2024   HDL 44.50 04/05/2024   LDLCALC 98 04/05/2024   TRIG 93.0 04/05/2024   CHOLHDL 4 04/05/2024   Lab Results  Component Value Date   WBC 12.4 (H) 04/05/2024   HGB 13.4 04/05/2024   HCT 41.7 04/05/2024   MCV 70.3 (L) 04/05/2024   PLT 360.0 04/05/2024   Lab Results  Component Value Date   IRON 39 (L) 04/06/2024   TIBC 378.0 04/06/2024   FERRITIN 35.1 04/06/2024    Attestation Statements:  Reviewed by clinician on day of visit: allergies, medications, problem list, medical history, surgical history, family history, social history, and previous encounter notes.  Time spent on visit including pre-visit chart review and post-visit charting and face- to face care including nutritional counseling, review of EKG, interpretation of body composition scale and indirect calorimetry and nutrition prescription  was 45 minutes.    Darice Haddock, D.O. DABFM, DABOM [facility] [address] [place], [place] [address] [phone]  "

## 2024-04-08 ENCOUNTER — Other Ambulatory Visit (HOSPITAL_BASED_OUTPATIENT_CLINIC_OR_DEPARTMENT_OTHER): Payer: Self-pay

## 2024-04-08 ENCOUNTER — Ambulatory Visit: Payer: Self-pay | Admitting: Family Medicine

## 2024-04-08 LAB — FOLATE: Folate: 9.4 ng/mL

## 2024-04-08 LAB — COMPREHENSIVE METABOLIC PANEL WITH GFR
ALT: 16 IU/L (ref 0–32)
AST: 16 IU/L (ref 0–40)
Albumin: 3.9 g/dL (ref 3.9–4.9)
Alkaline Phosphatase: 187 IU/L — ABNORMAL HIGH (ref 41–116)
BUN/Creatinine Ratio: 10 (ref 9–23)
BUN: 7 mg/dL (ref 6–20)
Bilirubin Total: 0.6 mg/dL (ref 0.0–1.2)
CO2: 30 mmol/L — ABNORMAL HIGH (ref 20–29)
Calcium: 9.6 mg/dL (ref 8.7–10.2)
Chloride: 94 mmol/L — ABNORMAL LOW (ref 96–106)
Creatinine, Ser: 0.67 mg/dL (ref 0.57–1.00)
Globulin, Total: 3.1 g/dL (ref 1.5–4.5)
Glucose: 125 mg/dL — ABNORMAL HIGH (ref 70–99)
Potassium: 4.3 mmol/L (ref 3.5–5.2)
Sodium: 141 mmol/L (ref 134–144)
Total Protein: 7 g/dL (ref 6.0–8.5)
eGFR: 120 mL/min/1.73

## 2024-04-08 LAB — IRON AND TIBC
Iron Saturation: 16 % (ref 15–55)
Iron: 52 ug/dL (ref 27–159)
Total Iron Binding Capacity: 326 ug/dL (ref 250–450)
UIBC: 274 ug/dL (ref 131–425)

## 2024-04-08 LAB — TESTOSTERONE: Testosterone: 19 ng/dL (ref 8–60)

## 2024-04-08 LAB — FERRITIN: Ferritin: 68 ng/mL (ref 15–150)

## 2024-04-08 LAB — INSULIN, RANDOM: INSULIN: 27.6 u[IU]/mL — ABNORMAL HIGH (ref 2.6–24.9)

## 2024-04-08 LAB — VITAMIN B12: Vitamin B-12: 674 pg/mL (ref 232–1245)

## 2024-04-08 LAB — VITAMIN D 25 HYDROXY (VIT D DEFICIENCY, FRACTURES): Vit D, 25-Hydroxy: 8.1 ng/mL — ABNORMAL LOW (ref 30.0–100.0)

## 2024-04-11 ENCOUNTER — Ambulatory Visit: Payer: Self-pay | Admitting: Family Medicine

## 2024-04-11 ENCOUNTER — Other Ambulatory Visit (HOSPITAL_COMMUNITY): Payer: Self-pay

## 2024-04-11 ENCOUNTER — Other Ambulatory Visit (HOSPITAL_BASED_OUTPATIENT_CLINIC_OR_DEPARTMENT_OTHER): Payer: Self-pay

## 2024-04-12 NOTE — Telephone Encounter (Signed)
 Additional information has been requested from the patient's insurance in order to proceed with the appeal request. Requested information has been sent, or form has been filled out and faxed back to [phone]

## 2024-04-13 ENCOUNTER — Inpatient Hospital Stay: Admitting: Pulmonary Disease

## 2024-04-19 ENCOUNTER — Other Ambulatory Visit (HOSPITAL_COMMUNITY): Payer: Self-pay

## 2024-04-19 ENCOUNTER — Ambulatory Visit

## 2024-04-19 VITALS — BP 105/72 | HR 103 | Temp 98.2°F | Ht 61.5 in | Wt 337.0 lb

## 2024-04-19 DIAGNOSIS — J9611 Chronic respiratory failure with hypoxia: Secondary | ICD-10-CM | POA: Diagnosis not present

## 2024-04-19 DIAGNOSIS — G4733 Obstructive sleep apnea (adult) (pediatric): Secondary | ICD-10-CM

## 2024-04-19 DIAGNOSIS — Z86711 Personal history of pulmonary embolism: Secondary | ICD-10-CM

## 2024-04-19 DIAGNOSIS — Z6841 Body Mass Index (BMI) 40.0 and over, adult: Secondary | ICD-10-CM | POA: Diagnosis not present

## 2024-04-19 DIAGNOSIS — Z87891 Personal history of nicotine dependence: Secondary | ICD-10-CM

## 2024-04-19 DIAGNOSIS — Z789 Other specified health status: Secondary | ICD-10-CM

## 2024-04-19 DIAGNOSIS — Z09 Encounter for follow-up examination after completed treatment for conditions other than malignant neoplasm: Secondary | ICD-10-CM

## 2024-04-19 NOTE — Assessment & Plan Note (Addendum)
-  On xarelto

## 2024-04-19 NOTE — Telephone Encounter (Signed)
 Insurance has approved the appeal for Trulicity  through 08/14/2024.  The patient has a copay of $4.00.  Thank you, Devere Pandy, PharmD Clinical Pharmacist  [place]  Direct Dial: [phone]

## 2024-04-20 ENCOUNTER — Other Ambulatory Visit (HOSPITAL_BASED_OUTPATIENT_CLINIC_OR_DEPARTMENT_OTHER): Payer: Self-pay

## 2024-04-21 ENCOUNTER — Other Ambulatory Visit (HOSPITAL_BASED_OUTPATIENT_CLINIC_OR_DEPARTMENT_OTHER): Payer: Self-pay

## 2024-04-21 ENCOUNTER — Telehealth: Admitting: Family Medicine

## 2024-04-21 DIAGNOSIS — E559 Vitamin D deficiency, unspecified: Secondary | ICD-10-CM

## 2024-04-21 DIAGNOSIS — R748 Abnormal levels of other serum enzymes: Secondary | ICD-10-CM | POA: Diagnosis not present

## 2024-04-21 DIAGNOSIS — R1011 Right upper quadrant pain: Secondary | ICD-10-CM | POA: Diagnosis not present

## 2024-04-21 DIAGNOSIS — Z7984 Long term (current) use of oral hypoglycemic drugs: Secondary | ICD-10-CM

## 2024-04-21 DIAGNOSIS — E611 Iron deficiency: Secondary | ICD-10-CM | POA: Diagnosis not present

## 2024-04-21 DIAGNOSIS — E119 Type 2 diabetes mellitus without complications: Secondary | ICD-10-CM | POA: Diagnosis not present

## 2024-04-21 DIAGNOSIS — Z6841 Body Mass Index (BMI) 40.0 and over, adult: Secondary | ICD-10-CM

## 2024-04-21 DIAGNOSIS — G4733 Obstructive sleep apnea (adult) (pediatric): Secondary | ICD-10-CM

## 2024-04-21 MED ORDER — MULTI-VITAMIN/MINERALS PO TABS
1.0000 | ORAL_TABLET | Freq: Every day | ORAL | 0 refills | Status: AC
  Filled 2024-04-21: qty 90, 90d supply, fill #0

## 2024-04-21 MED ORDER — VITAMIN D (ERGOCALCIFEROL) 1.25 MG (50000 UNIT) PO CAPS
50000.0000 [IU] | ORAL_CAPSULE | ORAL | 2 refills | Status: AC
  Filled 2024-04-21: qty 5, 35d supply, fill #0

## 2024-04-21 NOTE — Progress Notes (Signed)
 "  Office: [phone]  /  Fax: [phone]  WEIGHT SUMMARY AND BIOMETRICS  No data recorded No data recorded  No data recorded  No data recorded No data recorded I connected with  Lilybelle Irizarry on 04/21/24 by a video and audio enabled telemedicine application and verified that I am speaking with the correct person using two identifiers.  Patient Location: Home  Provider Location: Office/Clinic  Persons Participating in Visit: Patient.  I discussed the limitations of evaluation and management by telemedicine. The patient expressed understanding and agreed to proceed.   Vital Signs: Because this visit was a virtual/telehealth visit, some criteria may be missing or patient reported. Any vitals not documented were not able to be obtained and vitals that have been documented are patient reported.      HPI  Chief Complaint: OBESITY  Zerina is here to discuss her progress with her obesity treatment plan. She is on the the Category 3 Plan and states she is following her eating plan approximately 80 % of the time. She states she is exercising 10 minutes 5 times per week.   Interval History:  Since last office visit she is up 1 lb She recently started adding in the food on her meal plan cutting out some junk food Has good support at home Has hunger and cravings Plans to start Trulicity  this week Working to improve CPAP compliance Feeling under the weather today  Pharmacotherapy: none  PHYSICAL EXAM:  There were no vitals taken for this visit. There is no height or weight on file to calculate BMI.  General: She is overweight, cooperative, alert, well developed, and in no acute distress. PSYCH: Has normal mood, affect and thought process.   Lungs: Normal breathing effort, no conversational dyspnea.   ASSESSMENT AND PLAN  TREATMENT PLAN FOR OBESITY:  Recommended Dietary Goals  Kirstin is currently in the action stage of change. As such, her goal is to continue weight  management plan. She has agreed to the Category 3 Plan.+ 100 extra snack calories  Behavioral Intervention  We discussed the following Behavioral Modification Strategies today: increasing lean protein intake to established goals, increasing fiber rich foods, increasing water intake , work on meal planning and preparation, keeping healthy foods at home, continue to practice mindfulness when eating, and planning for success.  Additional resources provided today: NA  Recommended Physical Activity Goals  Eliora has been advised to work up to 150 minutes of moderate intensity aerobic activity a week and strengthening exercises 2-3 times per week for cardiovascular health, weight loss maintenance and preservation of muscle mass.   She has agreed to Think about enjoyable ways to increase daily physical activity and overcoming barriers to exercise and Increase physical activity in their day and reduce sedentary time (increase NEAT).  Pharmacotherapy changes for the treatment of obesity: none  ASSOCIATED CONDITIONS ADDRESSED TODAY  Elevated alkaline phosphatase level Lab Results  Component Value Date   ALKPHOS 187 (H) 04/07/2024  Reviewed labs with patient She had no abnormalities in her liver or gall bladder on CT abdomen/ pelvis done 03/21/24, reviewed today but is at risk for both hepatic steatosis and gall stones.  She has associated RUQ pain after eating.  Will obtain ultrasound.  -     US  ABDOMEN LIMITED RUQ (LIVER/GB); Future  Right upper quadrant pain -     US  ABDOMEN LIMITED RUQ (LIVER/GB); Future Denies post prandial N/V  Morbid obesity (HCC) Consider the role of bariatric surgery if failing to reach goals over  the next 6 mos  BMI 60.0-69.9, adult (HCC)  Low iron Lab Results  Component Value Date   IRON 52 04/07/2024   TIBC 326 04/07/2024   FERRITIN 68 04/07/2024  Iron sat low normal range Craves ice and has fatigue Begin a women's MVI with iron in it daily  Vitamin D   deficiency Last vitamin D  Lab Results  Component Value Date   VD25OH 8.1 (L) 04/07/2024  New.  Reviewed results with patient Discussed the importance of vitamin D  in energy level, bone health and immune function.  Begin RX vitamin D  1 x a week.  Repeat lab in 3 mos  OSA on CPAP Returned to Dr Pleas 2/3 and is working to improve CPAP compliance to 8 hrs at night while working on 50+ lb of weight loss.  Non-insulin  dependent type 2 diabetes mellitus (HCC) Lab Results  Component Value Date   HGBA1C 7.1 (H) 03/15/2024  Plans to start back on Trulicity  0.75 mg once weekly injection per Waddell Mon NP.  Doing well on Jardiance  + metformin .  Continue compliance on meal plan.  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      She was informed of the importance of frequent follow up visits to maximize her success with intensive lifestyle modifications for her multiple health conditions.   ATTESTASTION STATEMENTS:  Reviewed by clinician on day of visit: allergies, medications, problem list, medical history, surgical history, family history, social history, and previous encounter notes pertinent to obesity diagnosis.   I have personally spent 42 minutes total time today in preparation, patient care, nutritional counseling and education,  and documentation for this visit, including the following: review of most recent clinical lab tests, prescribing medications/ refilling medications, reviewing medical assistant documentation, review and interpretation of bioimpedence results.     Darice Haddock, D.O. DABFM, DABOM [facility] [address] [place], [place] [address] [phone]  "

## 2024-04-22 ENCOUNTER — Other Ambulatory Visit (HOSPITAL_BASED_OUTPATIENT_CLINIC_OR_DEPARTMENT_OTHER): Payer: Self-pay

## 2024-04-25 ENCOUNTER — Other Ambulatory Visit

## 2024-05-09 ENCOUNTER — Ambulatory Visit

## 2024-05-12 ENCOUNTER — Ambulatory Visit: Admitting: Family Medicine

## 2024-07-21 ENCOUNTER — Ambulatory Visit

## 2024-10-04 ENCOUNTER — Encounter: Admitting: Family Medicine
